# Patient Record
Sex: Male | Born: 1980
Health system: Southern US, Community
[De-identification: ages and names within clinical notes are randomized; demographics above are authoritative.]

## PROBLEM LIST (undated history)

## (undated) DIAGNOSIS — N183 Chronic kidney disease, stage 3 unspecified: Secondary | ICD-10-CM

## (undated) DIAGNOSIS — I639 Cerebral infarction, unspecified: Secondary | ICD-10-CM

## (undated) DIAGNOSIS — F419 Anxiety disorder, unspecified: Secondary | ICD-10-CM

## (undated) DIAGNOSIS — C6211 Malignant neoplasm of descended right testis: Secondary | ICD-10-CM

## (undated) DIAGNOSIS — I10 Essential (primary) hypertension: Secondary | ICD-10-CM

## (undated) DIAGNOSIS — N5089 Other specified disorders of the male genital organs: Secondary | ICD-10-CM

## (undated) DIAGNOSIS — R51 Headache: Secondary | ICD-10-CM

## (undated) DIAGNOSIS — R519 Headache, unspecified: Secondary | ICD-10-CM

## (undated) HISTORY — DX: Malignant neoplasm of descended right testis: C62.11

## (undated) HISTORY — DX: Chronic kidney disease, stage 3 (moderate): N18.3

## (undated) HISTORY — PX: NO PAST SURGERIES: SHX2092

## (undated) HISTORY — DX: Essential (primary) hypertension: I10

## (undated) HISTORY — DX: Chronic kidney disease, stage 3 unspecified: N18.30

---

## 2010-09-19 ENCOUNTER — Emergency Department (HOSPITAL_COMMUNITY)
Admission: EM | Admit: 2010-09-19 | Discharge: 2010-09-19 | Disposition: A | Discharge status: Home / Self Care | Payer: No Typology Code available for payment source | Attending: Emergency Medicine | Admitting: Emergency Medicine

## 2010-09-19 DIAGNOSIS — IMO0001 Reserved for inherently not codable concepts without codable children: Secondary | ICD-10-CM | POA: Insufficient documentation

## 2010-09-19 DIAGNOSIS — L0231 Cutaneous abscess of buttock: Secondary | ICD-10-CM | POA: Insufficient documentation

## 2010-09-19 DIAGNOSIS — Z79899 Other long term (current) drug therapy: Secondary | ICD-10-CM | POA: Insufficient documentation

## 2010-09-19 DIAGNOSIS — I10 Essential (primary) hypertension: Secondary | ICD-10-CM | POA: Insufficient documentation

## 2010-09-19 DIAGNOSIS — M79609 Pain in unspecified limb: Secondary | ICD-10-CM | POA: Insufficient documentation

## 2013-06-24 ENCOUNTER — Emergency Department (HOSPITAL_COMMUNITY): Payer: Self-pay

## 2013-06-24 ENCOUNTER — Emergency Department (HOSPITAL_COMMUNITY)
Admission: EM | Admit: 2013-06-24 | Discharge: 2013-06-25 | Disposition: A | Discharge status: Home / Self Care | Payer: Self-pay | Attending: Emergency Medicine | Admitting: Emergency Medicine

## 2013-06-24 ENCOUNTER — Encounter (HOSPITAL_COMMUNITY): Payer: Self-pay | Admitting: Emergency Medicine

## 2013-06-24 DIAGNOSIS — N5089 Other specified disorders of the male genital organs: Secondary | ICD-10-CM

## 2013-06-24 DIAGNOSIS — F17200 Nicotine dependence, unspecified, uncomplicated: Secondary | ICD-10-CM | POA: Insufficient documentation

## 2013-06-24 DIAGNOSIS — Z79899 Other long term (current) drug therapy: Secondary | ICD-10-CM | POA: Insufficient documentation

## 2013-06-24 DIAGNOSIS — N508 Other specified disorders of male genital organs: Secondary | ICD-10-CM | POA: Insufficient documentation

## 2013-06-24 DIAGNOSIS — N50811 Right testicular pain: Secondary | ICD-10-CM

## 2013-06-24 DIAGNOSIS — R599 Enlarged lymph nodes, unspecified: Secondary | ICD-10-CM | POA: Insufficient documentation

## 2013-06-24 DIAGNOSIS — I10 Essential (primary) hypertension: Secondary | ICD-10-CM | POA: Insufficient documentation

## 2013-06-24 DIAGNOSIS — G8929 Other chronic pain: Secondary | ICD-10-CM | POA: Insufficient documentation

## 2013-06-24 LAB — BASIC METABOLIC PANEL
BUN: 12 mg/dL (ref 6–23)
CALCIUM: 9.1 mg/dL (ref 8.4–10.5)
CO2: 32 mEq/L (ref 19–32)
CREATININE: 0.94 mg/dL (ref 0.50–1.35)
Chloride: 101 mEq/L (ref 96–112)
GFR calc Af Amer: >90 mL/min (ref >90)
GLUCOSE: 111 mg/dL — AB (ref 70–99)
Potassium: 3.2 mEq/L — ABNORMAL LOW (ref 3.7–5.3)
SODIUM: 142 meq/L (ref 137–147)

## 2013-06-24 LAB — CBC
HCT: 42.2 % (ref 39.0–52.0)
HEMOGLOBIN: 15 g/dL (ref 13.0–17.0)
MCH: 33.6 pg (ref 26.0–34.0)
MCHC: 35.5 g/dL (ref 30.0–36.0)
MCV: 94.6 fL (ref 78.0–100.0)
PLATELETS: 213 10*3/uL (ref 150–400)
RBC: 4.46 MIL/uL (ref 4.22–5.81)
RDW: 13.3 % (ref 11.5–15.5)
WBC: 10.8 10*3/uL — ABNORMAL HIGH (ref 4.0–10.5)

## 2013-06-24 MED ORDER — OXYCODONE-ACETAMINOPHEN 5-325 MG PO TABS
2.0000 | ORAL_TABLET | Freq: Once | ORAL | Status: AC
  Administered 2013-06-24: 2 via ORAL
  Filled 2013-06-24: qty 2

## 2013-06-24 NOTE — ED Notes (Signed)
US at bedside

## 2013-06-24 NOTE — ED Provider Notes (Signed)
CSN: [id number]     Arrival date & time 06/24/13  2221 History   First MD Initiated Contact with Patient 06/24/13 2259     Chief Complaint  Patient presents with  . Testicle Pain     (Consider location/radiation/quality/duration/timing/severity/associated sxs/prior Treatment) HPI Hx per PT - R testicle pain x 1 week, no trauma, is not sexually active x last 6 months. No dysuria or hematuria. No h/o kidney stones. Has chronic back pain - no new back pain and no ABD pain.  He did notice nontender R inguinal lymph node 1 month ago.  No rash or lesions otherwise. No F/C. No N/V. Pain is soreness, mod in severity. Hurts to touch.   Past Medical History  Diagnosis Date  . Hypertension    History reviewed. No pertinent past surgical history. No family history on file. History  Substance Use Topics  . Smoking status: Current Every Day Smoker    Types: Cigars, Cigarettes  . Smokeless tobacco: Not on file  . Alcohol Use: No    Review of Systems  Constitutional: Negative for fever and chills.  Eyes: Negative for redness.  Respiratory: Negative for shortness of breath.   Cardiovascular: Negative for chest pain.  Gastrointestinal: Negative for abdominal pain.  Genitourinary: Positive for testicular pain. Negative for dysuria, discharge and scrotal swelling.  Musculoskeletal: Negative for neck pain.  Skin: Negative for rash.  Neurological: Negative for headaches.  All other systems reviewed and are negative.     Allergies  Review of patient's allergies indicates no known allergies.  Home Medications   Prior to Admission medications   Medication Sig Start Date End Date Taking? Authorizing Provider  hydrochlorothiazide (HYDRODIURIL) 25 MG tablet Take 25 mg by mouth daily.   Yes Historical Provider, MD  lisinopril (PRINIVIL,ZESTRIL) 20 MG tablet Take 20 mg by mouth every evening.   Yes Historical Provider, MD  traMADol (ULTRAM) 50 MG tablet Take 50 mg by mouth every 6 (six) hours  as needed for moderate pain (back pain).   Yes Historical Provider, MD   BP 141/104  Pulse 95  Temp(Src) 98.3 F (36.8 C) (Oral)  Resp 20  SpO2 100% Physical Exam  Constitutional: He is oriented to person, place, and time. He appears well-developed and well-nourished.  HENT:  Head: Normocephalic and atraumatic.  Eyes: EOM are normal. Pupils are equal, round, and reactive to light.  Neck: Neck supple.  Cardiovascular: Regular rhythm and intact distal pulses.   Pulmonary/Chest: Effort normal. No respiratory distress.  Genitourinary:  Circ, tender R testicle no mass or edema. nontender R inguinal lymph node. No rash or lesion otherwise.   Musculoskeletal: Normal range of motion. He exhibits no edema.  Neurological: He is alert and oriented to person, place, and time. No cranial nerve deficit.  Skin: Skin is warm and dry.    ED Course  Procedures (including critical care time) Labs Review Labs Reviewed  CBC - Abnormal; Notable for the following:    WBC 10.8 (*)    All other components within normal limits  BASIC METABOLIC PANEL - Abnormal; Notable for the following:    Potassium 3.2 (*)    Glucose, Bld 111 (*)    All other components within normal limits  URINALYSIS, ROUTINE W REFLEX MICROSCOPIC    Imaging Review US Scrotum  06/25/2013   CLINICAL DATA:  Right testicular pain for 1 week.  EXAM: SCROTAL ULTRASOUND  DOPPLER ULTRASOUND OF THE TESTICLES  TECHNIQUE: Complete ultrasound examination of the testicles, epididymis, and other  scrotal structures was performed. Color and spectral Doppler ultrasound were also utilized to evaluate blood flow to the testicles.  COMPARISON:  None.  FINDINGS: Right testicle  Measurements: 4 x 1.8 x 2.6 cm. 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 Parenchymal echotexture is somewhat heterogeneous. No discrete mass lesions are identified but microlithiasis may correspond with increased incidence of testicular tumors. Normal  homogeneous flow signal demonstrated.  Left testicle  Measurements: 3.9 x 2 x 2.6 cm. Diffuse parenchymal microlithiasis. Mild diffusely heterogeneous parenchymal echotexture. Normal homogeneous flow signal demonstrated.  Right epididymis:  Normal in size and appearance.  Left epididymis:  Normal in size and appearance.  Hydrocele:  None visualized.  Varicocele:  Small bilateral varicoceles demonstrated.  Pulsed Doppler interrogation of both testes demonstrates low resistance arterial and venous waveforms bilaterally.  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 No evidence of torsion or epididymo-orchitis. Small bilateral varicoceles.   Electronically Signed   By: Lucienne Capers M.D.   On: 06/25/2013 00:16   [place] Art/ven Flow Abd Pelv Doppler  06/25/2013   CLINICAL DATA:  Right testicular pain for 1 week.  EXAM: SCROTAL ULTRASOUND  DOPPLER ULTRASOUND OF THE TESTICLES  TECHNIQUE: Complete ultrasound examination of the testicles, epididymis, and other scrotal structures was performed. Color and spectral Doppler ultrasound were also utilized to evaluate blood flow to the testicles.  COMPARISON:  None.  FINDINGS: Right testicle  Measurements: 4 x 1.8 x 2.6 cm. 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 Parenchymal echotexture is somewhat heterogeneous. No discrete mass lesions are identified but microlithiasis may correspond with increased incidence of testicular tumors. Normal homogeneous flow signal demonstrated.  Left testicle  Measurements: 3.9 x 2 x 2.6 cm. Diffuse parenchymal microlithiasis. Mild diffusely heterogeneous parenchymal echotexture. Normal homogeneous flow signal demonstrated.  Right epididymis:  Normal in size and appearance.  Left epididymis:  Normal in size and appearance.  Hydrocele:  None visualized.  Varicocele:  Small bilateral varicoceles demonstrated.  Pulsed Doppler interrogation of both testes demonstrates low  resistance arterial and venous waveforms bilaterally.  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 No evidence of torsion or epididymo-orchitis. Small bilateral varicoceles.   Electronically Signed   By: Lucienne Capers M.D.   On: 06/25/2013 00:16   Percocet, azithro/ rocephin, potassium provided  Pain improved on recheck. [place] and UA results shared with PT. Plan Urolopgy referral, ABX and close outpatient follow up.   MDM   Final diagnoses:  Pain in right testicle  Testicular microlithiasis   Presents with 1 week of testicle pain and R inguinal lypmphadenopathy. Denies any sexual active. Does have tenderness right testicle without swelling or mass. No torsion on ultrasound. No infectious symptoms otherwise/ no discharge. Evaluated with urinalysis and ultrasound reviewed as above. Pain improved with medications provided. Vital signs is reviewed and considered.    Teressa Lower, MD 06/26/13 [phone]

## 2013-06-24 NOTE — ED Notes (Signed)
Pt reports R testicular pain x 1 week-denies swelling or redness at this time.

## 2013-06-25 LAB — URINALYSIS, ROUTINE W REFLEX MICROSCOPIC
BILIRUBIN URINE: NEGATIVE
Glucose, UA: NEGATIVE mg/dL
HGB URINE DIPSTICK: NEGATIVE
KETONES UR: NEGATIVE mg/dL
Leukocytes, UA: NEGATIVE
Nitrite: NEGATIVE
Protein, ur: NEGATIVE mg/dL
SPECIFIC GRAVITY, URINE: 1.014 (ref 1.005–1.030)
UROBILINOGEN UA: 0.2 mg/dL (ref 0.0–1.0)
pH: 6 (ref 5.0–8.0)

## 2013-06-25 MED ORDER — POTASSIUM CHLORIDE CRYS ER 20 MEQ PO TBCR
40.0000 meq | EXTENDED_RELEASE_TABLET | Freq: Once | ORAL | Status: AC
  Administered 2013-06-25: 40 meq via ORAL
  Filled 2013-06-25: qty 2

## 2013-06-25 MED ORDER — OXYCODONE-ACETAMINOPHEN 5-325 MG PO TABS: 2.0000 | ORAL_TABLET | ORAL | Status: DC | PRN

## 2013-06-25 MED ORDER — CEFTRIAXONE SODIUM 250 MG IJ SOLR
250.0000 mg | Freq: Once | INTRAMUSCULAR | Status: AC
  Administered 2013-06-25: 250 mg via INTRAMUSCULAR
  Filled 2013-06-25: qty 250

## 2013-06-25 MED ORDER — AZITHROMYCIN 250 MG PO TABS
1000.0000 mg | ORAL_TABLET | Freq: Once | ORAL | Status: AC
  Administered 2013-06-25: 1000 mg via ORAL
  Filled 2013-06-25: qty 4

## 2013-06-25 MED ORDER — LIDOCAINE HCL 1 % IJ SOLN
INTRAMUSCULAR | Status: AC
  Administered 2013-06-25: 20 mL
  Filled 2013-06-25: qty 20

## 2013-06-25 MED ORDER — DOXYCYCLINE HYCLATE 100 MG PO CAPS: 100.0000 mg | ORAL_CAPSULE | Freq: Two times a day (BID) | ORAL | Status: DC

## 2013-06-25 NOTE — Discharge Instructions (Signed)
Testicular Self-Exam  A self-examination of your testicles involves looking at and feeling your testicles for abnormal lumps or swelling. Several things can cause swelling, lumps, or pain in your testicles. Some of these causes are:  · Injuries.  · Inflammation.  · Infection.  · Accumulation of fluids around your testicle (hydrocele).  · Twisted testicles (testicular torsion).  · Testicular cancer.  Self-examination of the testicles and groin areas may be advised if you are at risk for testicular cancer. Risks for testicular cancer include:  · An undescended testicle (cryptorchidism).  · A history of previous testicular cancer.  · A family history of testicular cancer.  The testicles are easiest to examine after warm baths or showers and are more difficult to examine when you are cold. This is because the muscles attached to the testicles retract and pull them up higher or into the abdomen.  Follow these steps while you are standing:  · Hold your penis away from your body.  · Roll one testicle between your thumb and forefinger, feeling the entire testicle.  · Roll the other testicle between your thumb and forefinger, feeling the entire testicle.  Feel for lumps, swelling, or discomfort. A normal testicle is egg shaped and feels firm. It is smooth and not tender. The spermatic cord can be felt as a firm spaghetti-like cord at the back of your testicle. It is also important to examine the crease between the front of your leg and your abdomen. Feel for any bumps that are tender. These could be enlarged lymph nodes.   Document Released: 05/06/2000 Document Revised: 09/30/2012 Document Reviewed: 07/20/2012  [employer]® Patient Information ©2014 [employer].

## 2013-09-13 ENCOUNTER — Emergency Department (HOSPITAL_COMMUNITY)
Admission: EM | Admit: 2013-09-13 | Discharge: 2013-09-13 | Disposition: A | Discharge status: Home / Self Care | Payer: Self-pay | Attending: Emergency Medicine | Admitting: Emergency Medicine

## 2013-09-13 ENCOUNTER — Emergency Department (HOSPITAL_COMMUNITY): Payer: Self-pay

## 2013-09-13 ENCOUNTER — Encounter (HOSPITAL_COMMUNITY): Payer: Self-pay | Admitting: Emergency Medicine

## 2013-09-13 DIAGNOSIS — R1032 Left lower quadrant pain: Secondary | ICD-10-CM

## 2013-09-13 DIAGNOSIS — R109 Unspecified abdominal pain: Secondary | ICD-10-CM | POA: Insufficient documentation

## 2013-09-13 DIAGNOSIS — F17200 Nicotine dependence, unspecified, uncomplicated: Secondary | ICD-10-CM | POA: Insufficient documentation

## 2013-09-13 DIAGNOSIS — K6289 Other specified diseases of anus and rectum: Secondary | ICD-10-CM | POA: Insufficient documentation

## 2013-09-13 DIAGNOSIS — I10 Essential (primary) hypertension: Secondary | ICD-10-CM | POA: Insufficient documentation

## 2013-09-13 DIAGNOSIS — Z79899 Other long term (current) drug therapy: Secondary | ICD-10-CM | POA: Insufficient documentation

## 2013-09-13 LAB — CBC WITH DIFFERENTIAL/PLATELET
BASOS PCT: 0 % (ref 0–1)
Basophils Absolute: 0 10*3/uL (ref 0.0–0.1)
EOS ABS: 0.3 10*3/uL (ref 0.0–0.7)
Eosinophils Relative: 2 % (ref 0–5)
HCT: 37.8 % — ABNORMAL LOW (ref 39.0–52.0)
Hemoglobin: 13.1 g/dL (ref 13.0–17.0)
Lymphocytes Relative: 23 % (ref 12–46)
Lymphs Abs: 2.8 10*3/uL (ref 0.7–4.0)
MCH: 32.3 pg (ref 26.0–34.0)
MCHC: 34.7 g/dL (ref 30.0–36.0)
MCV: 93.3 fL (ref 78.0–100.0)
Monocytes Absolute: 0.8 10*3/uL (ref 0.1–1.0)
Monocytes Relative: 6 % (ref 3–12)
NEUTROS PCT: 69 % (ref 43–77)
Neutro Abs: 8.2 10*3/uL — ABNORMAL HIGH (ref 1.7–7.7)
PLATELETS: 237 10*3/uL (ref 150–400)
RBC: 4.05 MIL/uL — ABNORMAL LOW (ref 4.22–5.81)
RDW: 13.3 % (ref 11.5–15.5)
WBC: 12.1 10*3/uL — ABNORMAL HIGH (ref 4.0–10.5)

## 2013-09-13 LAB — COMPREHENSIVE METABOLIC PANEL
ALT: 8 U/L (ref 0–53)
AST: 12 U/L (ref 0–37)
Albumin: 3.6 g/dL (ref 3.5–5.2)
Alkaline Phosphatase: 111 U/L (ref 39–117)
Anion gap: 12 (ref 5–15)
BUN: 7 mg/dL (ref 6–23)
CO2: 25 mEq/L (ref 19–32)
Calcium: 9.7 mg/dL (ref 8.4–10.5)
Chloride: 101 mEq/L (ref 96–112)
Creatinine, Ser: 1.01 mg/dL (ref 0.50–1.35)
GFR calc non Af Amer: >90 mL/min (ref >90)
Glucose, Bld: 124 mg/dL — ABNORMAL HIGH (ref 70–99)
POTASSIUM: 3.2 meq/L — AB (ref 3.7–5.3)
SODIUM: 138 meq/L (ref 137–147)
TOTAL PROTEIN: 7 g/dL (ref 6.0–8.3)
Total Bilirubin: 0.2 mg/dL — ABNORMAL LOW (ref 0.3–1.2)

## 2013-09-13 LAB — LIPASE, BLOOD: Lipase: 42 U/L (ref 11–59)

## 2013-09-13 LAB — URINALYSIS, ROUTINE W REFLEX MICROSCOPIC
Bilirubin Urine: NEGATIVE
Glucose, UA: NEGATIVE mg/dL
Hgb urine dipstick: NEGATIVE
Ketones, ur: NEGATIVE mg/dL
LEUKOCYTES UA: NEGATIVE
Nitrite: NEGATIVE
PROTEIN: NEGATIVE mg/dL
Specific Gravity, Urine: 1.024 (ref 1.005–1.030)
Urobilinogen, UA: 0.2 mg/dL (ref 0.0–1.0)
pH: 6 (ref 5.0–8.0)

## 2013-09-13 LAB — TYPE AND SCREEN
ABO/RH(D): B POS
Antibody Screen: NEGATIVE

## 2013-09-13 LAB — ABO/RH: ABO/RH(D): B POS

## 2013-09-13 LAB — POC OCCULT BLOOD, ED: Fecal Occult Bld: NEGATIVE

## 2013-09-13 MED ORDER — POTASSIUM CHLORIDE CRYS ER 20 MEQ PO TBCR
40.0000 meq | EXTENDED_RELEASE_TABLET | Freq: Once | ORAL | Status: AC
  Administered 2013-09-13: 40 meq via ORAL
  Filled 2013-09-13: qty 2

## 2013-09-13 MED ORDER — IOHEXOL 300 MG/ML  SOLN
100.0000 mL | Freq: Once | INTRAMUSCULAR | Status: AC | PRN
  Administered 2013-09-13: 100 mL via INTRAVENOUS

## 2013-09-13 MED ORDER — HYDROCHLOROTHIAZIDE 12.5 MG PO CAPS
25.0000 mg | ORAL_CAPSULE | Freq: Once | ORAL | Status: AC
  Administered 2013-09-13: 25 mg via ORAL
  Filled 2013-09-13: qty 2

## 2013-09-13 MED ORDER — HYDROCODONE-ACETAMINOPHEN 5-325 MG PO TABS: 1.0000 | ORAL_TABLET | Freq: Four times a day (QID) | ORAL | Status: DC | PRN

## 2013-09-13 MED ORDER — IOHEXOL 300 MG/ML  SOLN
50.0000 mL | Freq: Once | INTRAMUSCULAR | Status: AC | PRN
  Administered 2013-09-13: 50 mL via ORAL

## 2013-09-13 MED ORDER — SODIUM CHLORIDE 0.9 % IV BOLUS (SEPSIS)
1000.0000 mL | Freq: Once | INTRAVENOUS | Status: AC
  Administered 2013-09-13: 1000 mL via INTRAVENOUS

## 2013-09-13 MED ORDER — HYDROMORPHONE HCL PF 1 MG/ML IJ SOLN
1.0000 mg | Freq: Once | INTRAMUSCULAR | Status: AC
  Administered 2013-09-13: 1 mg via INTRAVENOUS
  Filled 2013-09-13: qty 1

## 2013-09-13 MED ORDER — SULFAMETHOXAZOLE-TRIMETHOPRIM 800-160 MG PO TABS: 1.0000 | ORAL_TABLET | Freq: Two times a day (BID) | ORAL | Status: DC

## 2013-09-13 MED ORDER — LISINOPRIL 20 MG PO TABS
20.0000 mg | ORAL_TABLET | Freq: Once | ORAL | Status: AC
  Administered 2013-09-13: 20 mg via ORAL
  Filled 2013-09-13: qty 1

## 2013-09-13 NOTE — ED Notes (Signed)
Pt aware urine sample is needed 

## 2013-09-13 NOTE — ED Provider Notes (Signed)
CSN: [id number]     Arrival date & time 09/13/13  1151 History   First MD Initiated Contact with Patient 09/13/13 1226     Chief Complaint  Patient presents with  . Abdominal Pain    HPI Mr. Boyett is a 33 yo man with a PMH of hypertension who is here for assessment of his abdominal pain and one episode of bloody diarrhea. The episode of bloody diarrhea occurred yesterday, with a moderate amount of red blood. Then, he woke up this morning with 10/10 abdominal pain that shifts with different positions. He describes it as a stabbing pain that has not subsided. He has never had pain like this. He was able to eat some cereal this morning, and while this did not make the pain worse, it also did not subside; he decided to come to the ED. He has not had any BMs today. He denies nausea, vomiting, recent travel, sick contacts, recent abnormal diet, recent antibiotic use (last over 2 months ago) or illicit drug use. He did not take his BP medications yet today.  Past Medical History  Diagnosis Date  . Hypertension    History reviewed. No pertinent past surgical history. No family history on file. History  Substance Use Topics  . Smoking status: Current Every Day Smoker    Types: Cigars, Cigarettes  . Smokeless tobacco: Not on file  . Alcohol Use: No     Review of Systems General: no fever, chills, recent illness Skin: no rashes HEENT: no headaches, changes in vision Cardiac: no chest pain, no palpitations Respiratory: no shortness of breath GI: see HPI Urinary: no dysuria, no hematuria Msk: no joint or muscle pain Endocrine: no changes in temperature tolerance, weight changes Psychiatric: no depression or anxiety   Allergies  Review of patient's allergies indicates no known allergies.  Home Medications   Prior to Admission medications   Medication Sig Start Date End Date Taking? Authorizing Provider  hydrochlorothiazide (HYDRODIURIL) 25 MG tablet Take 25 mg by mouth daily.   Yes  Historical Provider, MD  lisinopril (PRINIVIL,ZESTRIL) 20 MG tablet Take 20 mg by mouth every evening.   Yes Historical Provider, MD  traMADol (ULTRAM) 50 MG tablet Take 50 mg by mouth every 6 (six) hours as needed for moderate pain (back pain).   Yes Historical Provider, MD   BP 189/112  Pulse 85  Temp(Src) 98.7 F (37.1 C) (Oral)  Resp 18  SpO2 100% Physical Exam Appearance: lying prone in stretcher, looks uncomfortable HEENT: AT/[place], PERRL, EOMi Heart: RRR, normal S1S2, no MRG Lungs: CTAB, no WRR Abdomen: BS+, thin abdomen, moving around in bed but rigid on exam; soft when patient asked to relax, tender to palpation in all 4 quadrants, worst in LLQ, no rebound GU: CVA tenderness bilaterally Extremities: nontender, no edema Neurologic: A&Ox3, sensation and strength intact Skin: no rashes or lesions   ED Course  Procedures (including critical care time) Labs Review Labs Reviewed  CBC WITH DIFFERENTIAL - Abnormal; Notable for the following:    WBC 12.1 (*)    RBC 4.05 (*)    HCT 37.8 (*)    Neutro Abs 8.2 (*)    All other components within normal limits  COMPREHENSIVE METABOLIC PANEL  LIPASE, BLOOD  URINALYSIS, ROUTINE W REFLEX MICROSCOPIC  TYPE AND SCREEN  ABO/RH    MDM   Final diagnoses:  None   This 33 yo man with a PMH of hypertension is here for assessment of his abdominal pain with preceding bloody  diarrhea. He is afebrile with an elevated white count (12.1) and his exam revealed a rigid abdomen that was soft upon relaxation and tender to palpation throughout, worst in the LLQ. His FOBT was negative. The patient is moving around in bed. His lipase, LFTs and UA are within normal limits; potassium is a bit low at 3.2, likely in the context of his HCTZ. The differential remains broad, but includes diverticulitis.  - dilaudid for pain control - CT abdomen with contrast for evaluation of possible diverticulitis    Drucilla Schmidt, MD 09/13/13 1509  Drucilla Schmidt,  MD 09/13/13 1515  Drucilla Schmidt, MD 09/13/13 1517  Drucilla Schmidt, MD 09/13/13 [place], MD 09/13/13 [phone]

## 2013-09-13 NOTE — ED Notes (Addendum)
Initial contact-A&Ox4. Moving all extremities. Reports diarrhea x1 yesterday with small amount of blood ("not bright red"). Denies chest pain, SOB, dizziness. Denies fevers. Reports 10/10 abdominal pain starting today in and right and left abdomen. Radiates into pelvis. Guarding abdomen. Hasn't taken any medications today. RR even/unlabored. Not on blood thinners. No hx abdominal surgeries. No family hx esophageal/intestinal issues. Awaiting MD.

## 2013-09-13 NOTE — ED Notes (Signed)
Pt c/o abd pain x 1 day and rectal bleeding x 2 days. Denies n/v.

## 2013-09-13 NOTE — ED Provider Notes (Signed)
Patient is a 33 y.o. Male who was signed out to me from Dr. Tawnya Crook and resident Drucilla Schmidt for abdominal pain which start today following a blood BM yesterday.  Patients labs here show hypokalemia, mild leukocytosis, and negative UA.    Physical exam of the patient reveals an uncomfortable but non toxic appearing male in no acute distress.  Patient is alert and oriented x 4.  He moves all four extremities and follows commands.  Lungs are clear to auscultation A&P.  Heart is regular rate and rhythm.  There are no murmurs rubs and gallops.  There are bowel sounds in all four quadrants.  Abdomen in normal in appearance. There are positive bowel sounds in all four quadrants.  Patient has no rigidity, but has tenderness to palpation which is generalized.    Patient had CT scan of the abdomen and pelvis which shows possible mild proctitis at this time.  Patient is negative for diverticulitis or any other acute intraabdominal processes.  Patient is stable for discharge at this time.  I will given him one more dose of pain medication prior to leaving.  Will discharge the patient with prescriptions for Bactrim DS BID x 6 weeks at this time and hydrocodone 5/325 mg #6.  Will also give 40 mEq of potassium here prior to discharge and reorder home HTN meds at the patient is hypertensive here in the ED and has not taken his medications for his HTN today.  Patient was told to return to the ED for severe GI bleeding, intractable pain despite pain medication at home, and intractable nausea and vomiting.  Patient states understanding and agreement at this time.  Patient states that he will follow-up with his PCP at [facility] at this time.  I have discussed this patient with Dr. Kathrynn Humble at this time who agrees with the above treatment.    Cherylann Parr, PA-C 09/13/13 1720

## 2013-09-13 NOTE — Progress Notes (Signed)
[place],  Provided pt with a list of primary care resources to help patient establish primary care.

## 2013-09-13 NOTE — ED Notes (Signed)
MD at bedside. 

## 2013-09-13 NOTE — ED Notes (Signed)
Pt. Is unable to use the restroom at this time, but is aware that we need a urine specimen. Urinal at bedside. 

## 2013-09-13 NOTE — Discharge Instructions (Signed)
Proctitis Proctitis is the swelling and soreness (inflammation) of the lining of the rectum. The rectum is at the end of the large intestine and is attached to the anus. The inflammation causes pain and discomfort. It may be short-term (acute) or long-lasting (chronic). CAUSES Inflammation in the rectum can be caused by many things, such as:  Sexually transmitted diseases (STDs).  Infection.  Anal-rectal trauma or injury.  Ulcerative colitis or Crohn's disease.  Radiation therapy directed near the rectum.  Antibiotic therapy. SYMPTOMS  Sudden, uncomfortable, and frequent urge to have a bowel movement.  Anal or rectal pain.  Abdominal cramping or pain.  Sensation that the rectum is full.  Rectal bleeding.  Pus or mucus discharge from anus.  Diarrhea or frequent soft, loose stools. DIAGNOSIS Diagnosis may include the following:  A history and physical exam.  An STD test.  Blood tests.  Stool tests.  Rectal culture.  A procedure to evaluate the anal canal (anoscopy).  Procedures to look at part, or the entire large bowel (sigmoidoscopy, colonoscopy). TREATMENT Treatment of proctitis depends on the cause. Reducing the symptoms of inflammation and eliminating infection are the main goals of treatment. Treatment may include:  Home remedies and lifestyle, such as sitz baths and avoiding food right before bedtime.  Topical ointments, foams, suppositories, or enemas, such as corticosteroids or anti-inflammatories.  Antibiotic or antiviral medicines to treat infection or to control harmful bacteria.  Medicines to control diarrhea, soften stools, and reduce pain.  Medicines to suppress the immune system.  Avoiding the activity that caused rectal trauma.  Nutritional, dietary, or herbal supplements.  Heat or laser therapy for persistent bleeding.  A dilation procedure to enlarge a narrowed rectum.  Surgery, though rare, may be necessary to repair damaged rectal  lining. HOME CARE INSTRUCTIONS Only take medicines that are recommended or approved by your caregiver.Do not take anti-diarrhea medicine without your caregiver's approval. SEEK MEDICAL CARE IF:  You often experience one or more of the symptoms noted above.  You keep experiencing symptoms after treatment.  You have questions or concerns about your symptoms or treatment plan. MAKE SURE YOU:  Understand these instructions.  Will watch your condition.  Will get help right away if you are not doing well or get worse. [place] of Diabetes and Digestive and Kidney Disease (NIDDK): [email or web address] Document Released: 01/17/2011 Document Revised: 05/25/2012 Document Reviewed: 01/17/2011 [employer] [facility] Patient Information 2015 [place], [place]. This information is not intended to replace advice given to you by your health care provider. Make sure you discuss any questions you have with your health care provider.   Emergency Department Resource Guide 1) Find a Doctor and Pay Out of Pocket Although you won't have to find out who is covered by your insurance plan, it is a good idea to ask around and get recommendations. You will then need to call the office and see if the doctor you have chosen will accept you as a new patient and what types of options they offer for patients who are self-pay. Some doctors offer discounts or will set up payment plans for their patients who do not have insurance, but you will need to ask so you aren't surprised when you get to your appointment.  2) Contact Your Local Health Department Not all health departments have doctors that can see patients for sick visits, but many do, so it is worth a call to see if yours does. If you don't know where your local health department is, you  can check in your phone book. The CDC also has a tool to help you locate your state's health department, and many state websites also have listings of all of  their local health departments.  3) Find a [facility] If your illness is not likely to be very severe or complicated, you may want to try a walk in clinic. These are popping up all over the country in pharmacies, drugstores, and shopping centers. They're usually staffed by nurse practitioners or physician assistants that have been trained to treat common illnesses and complaints. They're usually fairly quick and inexpensive. However, if you have serious medical issues or chronic medical problems, these are probably not your best option.  No Primary Care Doctor: - Call Health Connect at  [phone] - they can help you locate a primary care doctor that  accepts your insurance, provides certain services, etc. - Physician Referral Service- [phone]  Chronic Pain Problems: Organization         Address  Phone   Notes  [facility]  [phone] Patients need to be referred by their primary care doctor.   Medication Assistance: Organization         Address  Phone   Notes  [facility] Medication [facility] [address] [place], [place] [address] [phone] --Must be a resident of [facility] -- Must have NO insurance coverage whatsoever (no Medicaid/ Medicare, etc.) -- The pt. MUST have a primary care doctor that directs their care regularly and follows them in the community   MedAssist  [phone]   [employer]  [phone]    Agencies that provide inexpensive medical care: Organization         Address  Phone   Notes  [place]  [phone]   [facility]    [phone]   [facility] [place], [place] [address] [phone]   [place] [address], [place] [phone]   Planned Parenthood    [phone]   [facility]    [phone]   [place] and [employer] [address],  [place] Phone:  [phone], Fax:  [phone] Hours of Operation:  9 am - 6 pm, M-F.  Also accepts Medicaid/Medicare and self-pay.  [facility] for [place] [address], [address], [place] Phone: [phone], Fax: [phone]. Hours of Operation:  8:30 am - 5:30 pm, M-F.  Also accepts Medicaid and self-pay.  [facility] [place] [address] [place] Phone: [phone]   [place], [place], [place] [phone], Ext. [phone] Mondays & Thursdays: 7-9 AM.  First 15 patients are seen on a first come, first serve basis.    Jane Providers:  Organization         Address  Phone   Notes  [facility] [address], [place] [phone] Also accepts self-pay patients.  [facility] [address], [place]  [phone]   [place], [address], [place] [phone]   [facility] [address], [place] [phone]   Lucianne Lei [address], [place]   [phone] Only accepts [place] Access [place] patients after they have their name applied  to their card.   Self-Pay (no insurance) in [facility]:  Organization         Address  Phone   Notes  Sickle Cell Patients, [facility] Internal [facility] [place] [phone]   [facility] [place] [phone]   [facility] [place]  [place], [place], [place] [phone]   [facility]/Dr. Osei-Bonsu  [address] [place] or [address], [place] [phone] Phone number for both [place] and [place] locations is the same.  Urgent Medical and [facility] [address] [place] [phone]   [facility] [address], [place] or [address] [phone]   [facility] [address] [place] [phone], phone; [phone], fax Sees patients 1st and 3rd Saturday of every month.  Must not qualify for public or private insurance (i.e. Medicaid, Medicare, [place] Health Choice, Veterans' Benefits)  Household income should be no more than 200% of the poverty level The clinic cannot treat you if you are pregnant or think you are pregnant  Sexually transmitted diseases are not treated at the clinic.    Dental Care: Organization         Address  Phone  Notes  [facility] of [facility] [place] [phone] Accepts children up to age 82 who are enrolled in [place] or [place]; pregnant women with a Medicaid card; and children who have applied for Medicaid or [place] Health Choice, but were declined, whose parents can pay a reduced fee at time of service.  [facility] Department of [facility]  [address], [place] [phone] Accepts children up to age 54 who are enrolled in [place] or [place]; pregnant women with a Medicaid card; and children who have applied for Medicaid or [place] Health Choice, but were declined, whose parents can pay a reduced fee at time of service.  [place] Adult [facility] Access PROGRAM  [place] [phone] Patients are seen by appointment only. Walk-ins are not accepted. [place] will see patients 68 years of age and older. Monday - Tuesday (8am-5pm) Most Wednesdays (8:30-5pm) $30 per visit, cash only  [facility] Adult Dental Access PROGRAM  [address], [facility] [phone] Patients are seen by appointment only. Walk-ins are not accepted. No Name will see patients 52 years of age and older. One Wednesday Evening (Monthly: Volunteer Based).  $30 per visit, cash only  [place]   [phone] for adults; Children under age 22, call Graduate Pediatric Dentistry at [phone]. Children aged 64-14, please call [phone] to request a pediatric application.  Dental services are provided in all areas of dental care including fillings, crowns and bridges, complete and partial dentures, implants, gum treatment, root canals, and extractions. Preventive care is also provided. Treatment is provided to both adults and children. Patients are selected via a lottery and there is often a waiting list.   [facility] [address], [place]  [phone] [email or web address]   [facility] [address] [place], [place] [phone], Ext. [phone] Second and Fourth Thursday of each month, opens at 6:30 AM; Clinic ends at 9 AM.  Patients are seen on a first-come first-served  basis, and a limited number are seen during each clinic.   [facility]  [address] Hillard Danker [place], [place] [phone]   Eligibility Requirements You must have lived in [place], [place], or [place] counties for at least the last three months.   You cannot be eligible for state or federal sponsored [employer], including [employer], [place], or [employer].   You generally cannot be eligible for healthcare insurance through your employer.    How to apply: Eligibility screenings are held every Tuesday and Wednesday afternoon from 1:00 pm until 4:00 pm. You do not need an appointment for the interview!  [facility] [address] [place], [place]   [place]  [place] Department  [place]  [phone]    Behavioral Health Resources in the Community: Intensive Outpatient Programs Organization         Address  Phone  Notes  Britton Hagan. [address] [place], [place] [phone]   [facility] Outpatient [address] [place], [place]   ADS: Alcohol & Drug Svcs [address] [place], [place]   [place] [address]  [place], [place] or [phone]   Substance Abuse Resources Organization         Address  Phone  Notes  Alcohol and Drug Services  [phone]   [place]  [phone]   The [place]   Chinita Pester  [phone]   Residential & Outpatient Substance Abuse Program  [phone]   Psychological Services Organization         Address  Phone  Notes  [facility] [place]  [place]  [phone]   [place] [address], [place] or [phone]    Mobile Crisis Teams Organization         Address  Phone  Notes  [facility]rapeutic Alternatives, Mobile Crisis Care Unit  [phone]   [facility]ive Psychotherapeutic Services  [address] [place] [address], [place]   [place] Levels [address] [place] [phone]    Self-Help/Support Groups Organization         Address  Phone             Notes  [place]. of [place] - variety of support groups  [place] Call for more information  Narcotics Anonymous (NA), [facility] [address], [employer] [place]  2 meetings at this location   Special educational needs teacher         Address  Phone  Notes  ASAP Residential Treatment [place],    [place]  [phone]   [facility]  [address] [place] [address], [place], [place]   [place], [place] [phone] Admissions: 8am-3pm M-F  Incentives Substance [place] [address],    [place], [place] [phone]   [facility] [address] Jadene Pierini [place], [place]   [facility] [address],  [place], [place]     Insight Programs - Intensive Outpatient [address] Kristeen [address], [place], [place]   [facility] ([place].) [place].,  [place], [place] or [phone]   [facility]idential Treatment Services (RTS) [address], [place], [place] Accepts  Medicaid  [facility] [address],  [place] [phone] Substance Abuse/Addiction Treatment   [facility] Organization         Address  Phone  Notes  [facility]  [phone]   Domenic Schwab, PhD [address] [place], [place]   [phone] or [phone]   [place], [place] [phone]   [address], [place], [place] [phone] Insurance/Medicaid/sponsorship through [facility] and Families [address], [address] [place]                                    [place], [place] [phone] [place] [address], [place] [phone]    Dr. Adele Schilder  [phone]   [facility] Dept. 1) [address] [place] 2) [place] 3)  [place] [address], [place] [phone]  [phone]   [place] [phone] or [phone] (After Hours)

## 2013-09-14 LAB — URINE CULTURE
COLONY COUNT: NO GROWTH
Culture: NO GROWTH

## 2013-09-14 NOTE — ED Provider Notes (Signed)
Medical screening examination/treatment/procedure(s) were conducted as a shared visit with resident-physician practitioner(s) and myself.  I personally evaluated the patient during the encounter.  Pt is a 33 y.o. male with pmhx as above presenting with abdominal pain and 1 episode of BRBPR.  Pt found to have generalized abdominal pain initially, but after tx w/ IVF, narcotics, pan localized to LLQ. No rebound, guarding or rigidity on my PE Rectal nml. CT ab/pelvis ordered at time of shift change to r/o diverticulitis.   Results for orders placed during the hospital encounter of 09/13/13  CBC WITH DIFFERENTIAL      Result Value Ref Range   WBC 12.1 (*) 4.0 - 10.5 K/uL   RBC 4.05 (*) 4.22 - 5.81 MIL/uL   Hemoglobin 13.1  13.0 - 17.0 g/dL   HCT 37.8 (*) 39.0 - 52.0 %   MCV 93.3  78.0 - 100.0 fL   MCH 32.3  26.0 - 34.0 pg   MCHC 34.7  30.0 - 36.0 g/dL   RDW 13.3  11.5 - 15.5 %   Platelets 237  150 - 400 K/uL   Neutrophils Relative % 69  43 - 77 %   Neutro Abs 8.2 (*) 1.7 - 7.7 K/uL   Lymphocytes Relative 23  12 - 46 %   Lymphs Abs 2.8  0.7 - 4.0 K/uL   Monocytes Relative 6  3 - 12 %   Monocytes Absolute 0.8  0.1 - 1.0 K/uL   Eosinophils Relative 2  0 - 5 %   Eosinophils Absolute 0.3  0.0 - 0.7 K/uL   Basophils Relative 0  0 - 1 %   Basophils Absolute 0.0  0.0 - 0.1 K/uL  COMPREHENSIVE METABOLIC PANEL      Result Value Ref Range   Sodium 138  137 - 147 mEq/L   Potassium 3.2 (*) 3.7 - 5.3 mEq/L   Chloride 101  96 - 112 mEq/L   CO2 25  19 - 32 mEq/L   Glucose, Bld 124 (*) 70 - 99 mg/dL   BUN 7  6 - 23 mg/dL   Creatinine, Ser 1.01  0.50 - 1.35 mg/dL   Calcium 9.7  8.4 - 10.5 mg/dL   Total Protein 7.0  6.0 - 8.3 g/dL   Albumin 3.6  3.5 - 5.2 g/dL   AST 12  0 - 37 U/L   ALT 8  0 - 53 U/L   Alkaline Phosphatase 111  39 - 117 U/L   Total Bilirubin 0.2 (*) 0.3 - 1.2 mg/dL   GFR calc non Af Amer >90  >90 mL/min   GFR calc Af Amer >90  >90 mL/min   Anion gap 12  5 - 15  LIPASE, BLOOD      Result Value Ref Range   Lipase 42  11 - 59 U/L  URINALYSIS, ROUTINE W REFLEX MICROSCOPIC      Result Value Ref Range   Color, Urine AMBER (*) YELLOW   APPearance CLEAR  CLEAR   Specific Gravity, Urine 1.024  1.005 - 1.030   pH 6.0  5.0 - 8.0   Glucose, UA NEGATIVE  NEGATIVE mg/dL   Hgb urine dipstick NEGATIVE  NEGATIVE   Bilirubin Urine NEGATIVE  NEGATIVE   Ketones, ur NEGATIVE  NEGATIVE mg/dL   Protein, ur NEGATIVE  NEGATIVE mg/dL   Urobilinogen, UA 0.2  0.0 - 1.0 mg/dL   Nitrite NEGATIVE  NEGATIVE   Leukocytes, UA NEGATIVE  NEGATIVE  POC OCCULT BLOOD, ED  Result Value Ref Range   Fecal Occult Bld NEGATIVE  NEGATIVE  TYPE AND SCREEN      Result Value Ref Range   ABO/RH(D) B POS     Antibody Screen NEG     Sample Expiration 09/16/2013    ABO/RH      Result Value Ref Range   ABO/RH(D) B POS     .    Neta Ehlers, MD 09/14/13 [phone]

## 2013-09-15 ENCOUNTER — Emergency Department (HOSPITAL_COMMUNITY)
Admission: EM | Admit: 2013-09-15 | Discharge: 2013-09-15 | Disposition: A | Discharge status: Home / Self Care | Payer: Self-pay | Source: Home / Self Care

## 2013-09-15 ENCOUNTER — Encounter (HOSPITAL_COMMUNITY): Payer: Self-pay | Admitting: Emergency Medicine

## 2013-09-15 DIAGNOSIS — R1084 Generalized abdominal pain: Secondary | ICD-10-CM

## 2013-09-15 NOTE — ED Provider Notes (Signed)
Medical screening examination/treatment/procedure(s) were performed by a resident physician or non-physician practitioner and as the supervising physician I was immediately available for consultation/collaboration.  Linna Darner, MD Family Medicine   Waldemar Dickens, MD 09/15/13 [phone]

## 2013-09-15 NOTE — ED Provider Notes (Signed)
Medical screening examination/treatment/procedure(s) were performed by non-physician practitioner and as supervising physician I was immediately available for consultation/collaboration.   EKG Interpretation None       Varney Biles, MD 09/15/13 0320

## 2013-09-15 NOTE — ED Provider Notes (Signed)
CSN: [id number]     Arrival date & time 09/15/13  2440 History   First MD Initiated Contact with Patient 09/15/13 1014     Chief Complaint  Patient presents with  . Abdominal Pain   (Consider location/radiation/quality/duration/timing/severity/associated sxs/prior Treatment) HPI Comments: 33 year old male presents with a complaint of severe constant abdominal pain. He was seen at [facility] 2 days ago for the same complaint in which he initially had a bloody diarrhea stool followed by the abdominal pain. A CT exam of the abdomen and pelvis revealed only inflammation in the distal rectum. There were no findings suggestive of a etiology for the abdominal pain. He was given IV fluids and IV Dilaudid for pain control. On discharge he was given a prescription for Septra and Norco. He was under the impression that if his abdominal pain persisted and he Norco was not working he should be seen by someone else would give him something stronger for his pain. That is the visit to the urgent care. He is having breakthrough abdominal pain while taking the Norco. Denies nausea vomiting or diarrhea. The pain is worse when stretching his body and that her when coiled up or sitting. The pain is constant and does not abate.                             Results for orders placed during the hospital encounter of 09/13/13  -URINE CULTURE    Specimen Description                                Special Requests              NONE                  Culture  Setup Time                                 Colony Count                                        Culture                                             Report Status                                     -CBC WITH DIFFERENTIAL    WBC K/uL                      12.1 (*)Ref Range: 4.0 - 10.5 K/uL    RBC MIL/uL                    4.05 (*)Ref Range: 4.22 - 5.81 MIL/uL    Hemoglobin g/dL               13.1    Ref Range: 13.0 - 17.0 g/dL    HCT  %  37.8 (*)Ref Range: 39.0 - 52.0 %    MCV fL                        93.3    Ref Range: 78.0 - 100.0 fL    MCH pg                        32.3    Ref Range: 26.0 - 34.0 pg    MCHC g/dL                     34.7    Ref Range: 30.0 - 36.0 g/dL    RDW %                         13.3    Ref Range: 11.5 - 15.5 %    Platelets K/uL                237     Ref Range: 150 - 400 K/uL    Neutrophils Relative % %      69      Ref Range: 43 - 77 %    Neutro Abs K/uL               8.2 (*) Ref Range: 1.7 - 7.7 K/uL    Lymphocytes Relative %        23      Ref Range: 12 - 46 %    Lymphs Abs K/uL               2.8     Ref Range: 0.7 - 4.0 K/uL    Monocytes Relative %          6       Ref Range: 3 - 12 %    Monocytes Absolute K/uL       0.8     Ref Range: 0.1 - 1.0 K/uL    Eosinophils Relative %        2       Ref Range: 0 - 5 %    Eosinophils Absolute K/uL     0.3     Ref Range: 0.0 - 0.7 K/uL    Basophils Relative %          0       Ref Range: 0 - 1 %    Basophils Absolute K/uL       0.0     Ref Range: 0.0 - 0.1 K/uL  -COMPREHENSIVE METABOLIC PANEL    Sodium mEq/L                  138     Ref Range: 137 - 147 mEq/L    Potassium mEq/L               3.2 (*) Ref Range: 3.7 - 5.3 mEq/L    Chloride mEq/L                101     Ref Range: 96 - 112 mEq/L    CO2 mEq/L                     25      Ref Range: 19 - 32 mEq/L    Glucose, Bld mg/dL            124 (*) Ref Range: 70 -  99 mg/dL    BUN mg/dL                     7       Ref Range: 6 - 23 mg/dL    Creatinine, Ser mg/dL         1.01    Ref Range: 0.50 - 1.35 mg/dL    Calcium mg/dL                 9.7     Ref Range: 8.4 - 10.5 mg/dL    Total Protein g/dL            7.0     Ref Range: 6.0 - 8.3 g/dL    Albumin g/dL                  3.6     Ref Range: 3.5 - 5.2 g/dL    AST U/L                       12      Ref Range: 0 - 37 U/L    ALT U/L                       8       Ref Range: 0 - 53 U/L    Alkaline Phosphatase U/L      111     Ref Range:  39 - 117 U/L    Total Bilirubin mg/dL         0.2 (*) Ref Range: 0.3 - 1.2 mg/dL    GFR calc non Af Amer mL/min   >90     Ref Range: >90 mL/min    GFR calc Af Amer mL/min       >90     Ref Range: >90 mL/min    Anion gap                     12      Ref Range: 5 - 15  -LIPASE, BLOOD    Lipase U/L                    42      Ref Range: 11 - 59 U/L  -URINALYSIS, ROUTINE W REFLEX MICROSCOPIC    Color, Urine                  AMBER (*)Ref Range: YELLOW    APPearance                    CLEAR   Ref Range: CLEAR    Specific Gravity, Urine       1.024   Ref Range: 1.005 - 1.030    pH                            6.0     Ref Range: 5.0 - 8.0    Glucose, UA mg/dL             NEGATIVE Ref Range: NEGATIVE mg/dL    Hgb urine dipstick            NEGATIVE Ref Range: NEGATIVE    Bilirubin Urine               NEGATIVE Ref Range: NEGATIVE    Ketones, ur mg/dL  NEGATIVE Ref Range: NEGATIVE mg/dL    Protein, ur mg/dL             NEGATIVE Ref Range: NEGATIVE mg/dL    Urobilinogen, UA mg/dL        0.2     Ref Range: 0.0 - 1.0 mg/dL    Nitrite                       NEGATIVE Ref Range: NEGATIVE    Leukocytes, UA                NEGATIVE Ref Range: NEGATIVE  -POC OCCULT BLOOD, ED    Fecal Occult Bld              NEGATIVE Ref Range: NEGATIVE  -TYPE AND SCREEN    ABO/RH(D)                     B POS                 Antibody Screen               NEG                   Sample Expiration             09/16/2013             -ABO/RH    ABO/RH(D)                     B POS              ,  Patient is a 33 y.o. male presenting with abdominal pain.  Abdominal Pain Associated symptoms: no chest pain, no cough, no diarrhea, no fever, no nausea, no shortness of breath and no vomiting     Past Medical History  Diagnosis Date  . Hypertension    History reviewed. No pertinent past surgical history. History reviewed. No pertinent family history. History  Substance Use Topics  . Smoking status: Current Every Day  Smoker    Types: Cigars, Cigarettes  . Smokeless tobacco: Not on file  . Alcohol Use: No    Review of Systems  Constitutional: Positive for activity change and appetite change. Negative for fever.  HENT: Negative.   Respiratory: Negative for cough and shortness of breath.   Cardiovascular: Negative for chest pain and leg swelling.  Gastrointestinal: Positive for abdominal pain. Negative for nausea, vomiting, diarrhea, anal bleeding and rectal pain.  Genitourinary: Negative.  Negative for enuresis.  Skin: Negative for color change and rash.  Neurological: Negative.     Allergies  Review of patient's allergies indicates no known allergies.  Home Medications   Prior to Admission medications   Medication Sig Start Date End Date Taking? Authorizing Provider  hydrochlorothiazide (HYDRODIURIL) 25 MG tablet Take 25 mg by mouth daily.   Yes Historical Provider, MD  HYDROcodone-acetaminophen (NORCO/VICODIN) 5-325 MG per tablet Take 1-2 tablets by mouth every 6 (six) hours as needed for moderate pain or severe pain. 09/13/13  Yes Courtney A Forcucci, PA-C  lisinopril (PRINIVIL,ZESTRIL) 20 MG tablet Take 20 mg by mouth every evening.   Yes Historical Provider, MD  sulfamethoxazole-trimethoprim (SEPTRA DS) 800-160 MG per tablet Take 1 tablet by mouth 2 (two) times daily. 09/13/13  Yes Courtney A Forcucci, PA-C  traMADol (ULTRAM) 50 MG tablet Take 50 mg by mouth every 6 (six) hours as needed for moderate pain (back  pain).    Historical Provider, MD   BP 150/120  Pulse 111  Temp(Src) 98.4 F (36.9 C) (Oral)  Resp 18  SpO2 100% Physical Exam  Nursing note and vitals reviewed. Constitutional: He is oriented to person, place, and time. He appears well-developed and well-nourished. No distress.  Eyes: Conjunctivae and EOM are normal.  Neck: Normal range of motion. Neck supple.  Cardiovascular: Normal rate, regular rhythm, normal heart sounds and intact distal pulses.   Pulmonary/Chest: Effort  normal and breath sounds normal. No respiratory distress. He has no wheezes. He has no rales.  Abdominal: He exhibits no mass.  Abdomen with frequent guarding advised  to relax and breathe through the examination. There is marked, severe tenderness to the left hemiabdomen. No tenderness to the right hemiabdomen. Guarding persists. Most of the abdomen percusses tympanic. Bowel sounds are hypoactive.  Musculoskeletal: He exhibits no edema.  Neurological: He is alert and oriented to person, place, and time. He exhibits normal muscle tone.  Skin: Skin is warm and dry. No rash noted.  Psychiatric: His speech is normal. His mood appears anxious.    ED Course  Procedures (including critical care time) Labs Review Labs Reviewed - No data to display  Imaging Review Ct Abdomen Pelvis W Contrast  09/13/2013   CLINICAL DATA:  Pain.  Bloody diarrhea.  EXAM: CT ABDOMEN AND PELVIS WITH CONTRAST  TECHNIQUE: Multidetector CT imaging of the abdomen and pelvis was performed using the standard protocol following bolus administration of intravenous contrast.  CONTRAST:  54mL OMNIPAQUE IOHEXOL 300 MG/ML SOLN, 137mL OMNIPAQUE IOHEXOL 300 MG/ML SOLN  COMPARISON:  None.  FINDINGS: No focal hepatic abnormality identified. Fatty infiltration of the liver present. Spleen is normal. Pancreas is normal. No biliary distention.  Adrenals are normal. Kidneys are normal. No hydronephrosis or obstructing ureteral stone. The bladder is nondistended. Calcification of the pelvis consistent with phleboliths.Prostate is not enlarged.  Aorta and its visceral branches widely patent.  Appendix is normal. No bowel distention. Mild rectal wall thickening is noted. Mild proctitis cannot be excluded . No inflammatory changes are left lower quadrant. No free air.  Lung bases are clear. Heart size normal. No acute bony abnormality.  IMPRESSION: Mild rectal wall thickening is noted. Mild proctitis cannot be excluded.   Electronically Signed   By:  Marcello Moores  Register   On: 09/13/2013 16:29     MDM   1. Generalized abdominal pain     I believe the patient has genuine abdominal pain however there is no clear etiology. The pain is moderate to severe and providing a more potent scheduled II narcotic for persistent abdominal pain without a clear etiology is not in the pt's best interest. He is advised to go to the emergency department for intractable abdominal pain while taking Norco. This instruction was noted within the ED visit notes. I have strongly recommended that the pt go to the ED now. He may need to be admitted and or call in a specialist for additional  E/M. The patient declines to go to the emergency department due to finances and his belief that he will not be helped. Burman Nieves is assisting with financial assistance while in the UC. Is unlikely that I would be able to get him into an office of a surgeon or gastroenterologist for an examination of moderate to severe abdominal pain. He has been advised that by not going to the ED his pain may worsen, he may have a ruptured viscus, bleeding, organ damage or other  severe condition that may even late to get up. He will sign AMA.    Janne Napoleon, NP 09/15/13 [phone]

## 2013-09-15 NOTE — Discharge Instructions (Signed)
Abdominal Pain °Many things can cause abdominal pain. Usually, abdominal pain is not caused by a disease and will improve without treatment. It can often be observed and treated at home. Your health care provider will do a physical exam and possibly order blood tests and X-rays to help determine the seriousness of your pain. However, in many cases, more time must pass before a clear cause of the pain can be found. Before that point, your health care provider may not know if you need more testing or further treatment. °HOME CARE INSTRUCTIONS  °Monitor your abdominal pain for any changes. The following actions may help to alleviate any discomfort you are experiencing: °· Only take over-the-counter or prescription medicines as directed by your health care provider. °· Do not take laxatives unless directed to do so by your health care provider. °· Try a clear liquid diet (broth, tea, or water) as directed by your health care provider. Slowly move to a bland diet as tolerated. °SEEK MEDICAL CARE IF: °· You have unexplained abdominal pain. °· You have abdominal pain associated with nausea or diarrhea. °· You have pain when you urinate or have a bowel movement. °· You experience abdominal pain that wakes you in the night. °· You have abdominal pain that is worsened or improved by eating food. °· You have abdominal pain that is worsened with eating fatty foods. °· You have a fever. °SEEK IMMEDIATE MEDICAL CARE IF:  °· Your pain does not go away within 2 hours. °· You keep throwing up (vomiting). °· Your pain is felt only in portions of the abdomen, such as the right side or the left lower portion of the abdomen. °· You pass bloody or black tarry stools. °MAKE SURE YOU: °· Understand these instructions.   °· Will watch your condition.   °· Will get help right away if you are not doing well or get worse.   °Document Released: 11/07/2004 Document Revised: 02/02/2013 Document Reviewed: 10/07/2012 °[employer]® Patient Information  ©2015 [employer]. This information is not intended to replace advice given to you by your health care provider. Make sure you discuss any questions you have with your health care provider. ° °Pain of Unknown Etiology (Pain Without a Known Cause) °You have come to your caregiver because of pain. Pain can occur in any part of the body. Often there is not a definite cause. If your laboratory (blood or urine) work was normal and X-rays or other studies were normal, your caregiver may treat you without knowing the cause of the pain. An example of this is the headache. Most headaches are diagnosed by taking a history. This means your caregiver asks you questions about your headaches. Your caregiver determines a treatment based on your answers. Usually testing done for headaches is normal. Often testing is not done unless there is no response to medications. Regardless of where your pain is located today, you can be given medications to make you comfortable. If no physical cause of pain can be found, most cases of pain will gradually leave as suddenly as they came.  °If you have a painful condition and no reason can be found for the pain, it is important that you follow up with your caregiver. If the pain becomes worse or does not go away, it may be necessary to repeat tests and look further for a possible cause. °· Only take over-the-counter or prescription medicines for pain, discomfort, or fever as directed by your caregiver. °· For the protection of your privacy, test results   cannot be given over the phone. Make sure you receive the results of your test. Ask how these results are to be obtained if you have not been informed. It is your responsibility to obtain your test results. °· You may continue all activities unless the activities cause more pain. When the pain lessens, it is important to gradually resume normal activities. Resume activities by beginning slowly and gradually increasing the intensity and duration  of the activities or exercise. During periods of severe pain, bed rest may be helpful. Lie or sit in any position that is comfortable. °· Ice used for acute (sudden) conditions may be effective. Use a large plastic bag filled with ice and wrapped in a towel. This may provide pain relief. °· See your caregiver for continued problems. Your caregiver can help or refer you for exercises or physical therapy if necessary. °If you were given medications for your condition, do not drive, operate machinery or power tools, or sign legal documents for 24 hours. Do not drink alcohol, take sleeping pills, or take other medications that may interfere with treatment. °See your caregiver immediately if you have pain that is becoming worse and not relieved by medications. °Document Released: 10/23/2000 Document Revised: 11/18/2012 Document Reviewed: 01/28/2005 °[employer]® Patient Information ©2015 [employer]. This information is not intended to replace advice given to you by your health care provider. Make sure you discuss any questions you have with your health care provider. ° °

## 2013-09-15 NOTE — ED Notes (Signed)
C/o  Severe abdominal pain.  Pain is worse with sitting up right and lying down.  No relief with pain meds.  States taking antibiotics as directed.

## 2014-05-19 ENCOUNTER — Emergency Department (HOSPITAL_COMMUNITY): Payer: Self-pay

## 2014-05-19 ENCOUNTER — Emergency Department (HOSPITAL_COMMUNITY)
Admission: EM | Admit: 2014-05-19 | Discharge: 2014-05-19 | Disposition: A | Discharge status: Home / Self Care | Payer: Self-pay | Attending: Emergency Medicine | Admitting: Emergency Medicine

## 2014-05-19 ENCOUNTER — Encounter (HOSPITAL_COMMUNITY): Payer: Self-pay

## 2014-05-19 DIAGNOSIS — Z720 Tobacco use: Secondary | ICD-10-CM | POA: Insufficient documentation

## 2014-05-19 DIAGNOSIS — R109 Unspecified abdominal pain: Secondary | ICD-10-CM

## 2014-05-19 DIAGNOSIS — K640 First degree hemorrhoids: Secondary | ICD-10-CM | POA: Insufficient documentation

## 2014-05-19 DIAGNOSIS — I10 Essential (primary) hypertension: Secondary | ICD-10-CM | POA: Insufficient documentation

## 2014-05-19 DIAGNOSIS — Z792 Long term (current) use of antibiotics: Secondary | ICD-10-CM | POA: Insufficient documentation

## 2014-05-19 DIAGNOSIS — K921 Melena: Secondary | ICD-10-CM | POA: Insufficient documentation

## 2014-05-19 LAB — COMPREHENSIVE METABOLIC PANEL
ALBUMIN: 5.1 g/dL (ref 3.5–5.2)
ALT: 15 U/L (ref 0–53)
AST: 21 U/L (ref 0–37)
Alkaline Phosphatase: 108 U/L (ref 39–117)
Anion gap: 9 (ref 5–15)
BUN: 14 mg/dL (ref 6–23)
CALCIUM: 9.6 mg/dL (ref 8.4–10.5)
CO2: 23 mmol/L (ref 19–32)
Chloride: 103 mmol/L (ref 96–112)
Creatinine, Ser: 0.95 mg/dL (ref 0.50–1.35)
Glucose, Bld: 109 mg/dL — ABNORMAL HIGH (ref 70–99)
Potassium: 4.1 mmol/L (ref 3.5–5.1)
SODIUM: 135 mmol/L (ref 135–145)
Total Bilirubin: 1 mg/dL (ref 0.3–1.2)
Total Protein: 8.2 g/dL (ref 6.0–8.3)

## 2014-05-19 LAB — CBC WITH DIFFERENTIAL/PLATELET
BASOS PCT: 0 % (ref 0–1)
Basophils Absolute: 0 10*3/uL (ref 0.0–0.1)
EOS PCT: 1 % (ref 0–5)
Eosinophils Absolute: 0.2 10*3/uL (ref 0.0–0.7)
HCT: 42.3 % (ref 39.0–52.0)
Hemoglobin: 14.8 g/dL (ref 13.0–17.0)
LYMPHS ABS: 2.3 10*3/uL (ref 0.7–4.0)
Lymphocytes Relative: 16 % (ref 12–46)
MCH: 33 pg (ref 26.0–34.0)
MCHC: 35 g/dL (ref 30.0–36.0)
MCV: 94.2 fL (ref 78.0–100.0)
MONO ABS: 0.9 10*3/uL (ref 0.1–1.0)
Monocytes Relative: 6 % (ref 3–12)
Neutro Abs: 11.4 10*3/uL — ABNORMAL HIGH (ref 1.7–7.7)
Neutrophils Relative %: 77 % (ref 43–77)
Platelets: 286 10*3/uL (ref 150–400)
RBC: 4.49 MIL/uL (ref 4.22–5.81)
RDW: 14 % (ref 11.5–15.5)
WBC: 14.8 10*3/uL — ABNORMAL HIGH (ref 4.0–10.5)

## 2014-05-19 LAB — POC OCCULT BLOOD, ED: FECAL OCCULT BLD: NEGATIVE

## 2014-05-19 LAB — LIPASE, BLOOD: Lipase: 23 U/L (ref 11–59)

## 2014-05-19 MED ORDER — AMOXICILLIN-POT CLAVULANATE 875-125 MG PO TABS: 1.0000 | ORAL_TABLET | Freq: Two times a day (BID) | ORAL | Status: DC

## 2014-05-19 MED ORDER — POLYETHYLENE GLYCOL 3350 17 G PO PACK: 17.0000 g | PACK | Freq: Every day | ORAL | Status: DC

## 2014-05-19 MED ORDER — LISINOPRIL 20 MG PO TABS: 20.0000 mg | ORAL_TABLET | Freq: Every day | ORAL | Status: DC

## 2014-05-19 MED ORDER — HYDROCORTISONE 2.5 % RE CREA: TOPICAL_CREAM | RECTAL | Status: DC

## 2014-05-19 MED ORDER — HYDROCHLOROTHIAZIDE 25 MG PO TABS: 25.0000 mg | ORAL_TABLET | Freq: Every day | ORAL | Status: DC

## 2014-05-19 MED ORDER — HYDROCODONE-ACETAMINOPHEN 5-325 MG PO TABS: 1.0000 | ORAL_TABLET | Freq: Four times a day (QID) | ORAL | Status: DC | PRN

## 2014-05-19 MED ORDER — IOHEXOL 300 MG/ML  SOLN: 50.0000 mL | Freq: Once | INTRAMUSCULAR | Status: DC | PRN

## 2014-05-19 NOTE — ED Notes (Signed)
Patient states he has had mid-lower abdominal pain and bright red blood per rectum since yesterday.  Patient states she has had 2 loose stools with bright red blood since yesterday - once yesterday and once today.  Patient denies N/V/D and fever.  Patient SOB, weakness and chest pain.  On exam, patient's lung sounds are clear with no wheezing, rales or rhonchi.  Cardiac sound WNL S1/S2, no rub, gallop or murmur heard.  +3 radial and pedal pulses.  No pre-tibial or pedal edema noted.  Bowel sounds hypoactive, increased pain to abdominal palpation.

## 2014-05-19 NOTE — Discharge Instructions (Signed)
Take augmentin twice daily for a week.   Take miralax to keep your stools soft. Use anusol cream for hemorrhoids. Try sitz bath twice daily.   Take norco for severe pain. Do NOT drive with it.   Your blood pressure is high. Take hctz, lisinopril as prescribed.   You need to see a primary care doctor.   See a GI doctor.   Return to ER if you have severe pain, fever, vomiting.

## 2014-05-19 NOTE — ED Notes (Signed)
Pain in abdomen x 2 days.  No n/v.  Liquid and solid stool.  No fever.  Blood in stool last night.  Also c/o back pain.

## 2014-05-19 NOTE — ED Provider Notes (Signed)
CSN: [id number]     Arrival date & time 05/19/14  1112 History   First MD Initiated Contact with Patient 05/19/14 1512     Chief Complaint  Patient presents with  . Rectal Bleeding  . Abdominal Pain     (Consider location/radiation/quality/duration/timing/severity/associated sxs/prior Treatment) The history is provided by the patient.  Caleb Hubbard is a 34 y.o. male hx of HTN, who presenting with abdominal pain. Abdominal pain for the last 2 days. States that he has right upper quadrant pain. He also has 2 loose stools since yesterday. States that his stool was brown but blood-tinged. Denies any nausea or vomiting but has not been eating much. Is told to come here for evaluation by his work. Of note he has not been taking this hydrocodone thiazide or lisinopril but denies any chest pain or headache or weakness. He was also in the ER August of last year and was diagnosed with proctitis and was prescribed a course of Septra.    Past Medical History  Diagnosis Date  . Hypertension    History reviewed. No pertinent past surgical history. History reviewed. No pertinent family history. History  Substance Use Topics  . Smoking status: Current Every Day Smoker    Types: Cigars, Cigarettes  . Smokeless tobacco: Not on file  . Alcohol Use: No    Review of Systems  Gastrointestinal: Positive for abdominal pain and hematochezia.  All other systems reviewed and are negative.     Allergies  Review of patient's allergies indicates no known allergies.  Home Medications   Prior to Admission medications   Medication Sig Start Date End Date Taking? Authorizing Provider  acetaminophen (TYLENOL) 325 MG tablet Take 1,300 mg by mouth every 6 (six) hours as needed for mild pain, moderate pain or headache.   Yes Historical Provider, MD  HYDROcodone-acetaminophen (NORCO/VICODIN) 5-325 MG per tablet Take 1-2 tablets by mouth every 6 (six) hours as needed for moderate pain or severe pain. Patient not  taking: Reported on 05/19/2014 09/13/13   Loma Sousa Forcucci, PA-C  sulfamethoxazole-trimethoprim (SEPTRA DS) 800-160 MG per tablet Take 1 tablet by mouth 2 (two) times daily. Patient not taking: Reported on 05/19/2014 09/13/13   Loma Sousa Forcucci, PA-C   BP 185/123 mmHg  Pulse 97  Temp(Src) 98.8 F (37.1 C) (Oral)  Resp 16  SpO2 100% Physical Exam  Constitutional: He is oriented to person, place, and time. He appears well-developed and well-nourished.  HENT:  Head: Normocephalic.  Mouth/Throat: Oropharynx is clear and moist.  Eyes: Conjunctivae are normal. Pupils are equal, round, and reactive to light.  Neck: Normal range of motion.  Cardiovascular: Normal rate, regular rhythm and normal heart sounds.   Pulmonary/Chest: Effort normal and breath sounds normal. No respiratory distress. He has no wheezes. He has no rales.  Abdominal: Soft. Bowel sounds are normal.  Mild RUQ tenderness, no rebound. Mild LLQ tenderness, no rebound. Small tender lymph node R groin, mobile, no sign of abscess   Genitourinary:  Rectal- external hemorrhoid, not thrombosed. Prostate not tender   Musculoskeletal: Normal range of motion. He exhibits no edema or tenderness.  Neurological: He is alert and oriented to person, place, and time.  Skin: Skin is warm and dry.  Psychiatric: He has a normal mood and affect. His behavior is normal. Judgment and thought content normal.  Nursing note and vitals reviewed.   ED Course  Procedures (including critical care time)   EMERGENCY DEPARTMENT US GALLBLADDER INTERPRETATION "Study: Limited Ultrasound of the gallbladder and common  bile duct."  INDICATIONS: Abdominal pain and RUQ pain Indication: Multiple views of the gallbladder and common bile duct are obtained with a  Multi-frequency probe."  PERFORMED BY:  Myself  IMAGES ARCHIVED?: Yes  FINDINGS: Gallstones absent and Gallbladder wall normal in thickness  LIMITATIONS: Abdominal pain  INTERPRETATION:  Normal  COMMENT:  No gallstones    Labs Review Labs Reviewed  CBC WITH DIFFERENTIAL/PLATELET - Abnormal; Notable for the following:    WBC 14.8 (*)    Neutro Abs 11.4 (*)    All other components within normal limits  COMPREHENSIVE METABOLIC PANEL - Abnormal; Notable for the following:    Glucose, Bld 109 (*)    All other components within normal limits  LIPASE, BLOOD  URINALYSIS, ROUTINE W REFLEX MICROSCOPIC  POC OCCULT BLOOD, ED    Imaging Review No results found.   EKG Interpretation None      MDM   Final diagnoses:  Abdominal pain    Caleb Hubbard is a 34 y.o. male here with ab pain. Mild RUQ and LLQ tenderness. Beside US showed no gallstones. WBC 15, I think likely colitis, also consider IBD as well. I offered CT ab/pel to further assess but patient has no insurance so refused. Also hypertensive but hasn't been taking meds. No signs of hypertensive emergency. Will dc with norco, augmentin, hctz, lisinopril.    Wandra Arthurs, MD 05/19/14 [phone]

## 2016-04-15 ENCOUNTER — Emergency Department (HOSPITAL_COMMUNITY)
Admission: EM | Admit: 2016-04-15 | Discharge: 2016-04-15 | Disposition: A | Discharge status: Home / Self Care | Payer: Self-pay | Attending: Emergency Medicine | Admitting: Emergency Medicine

## 2016-04-15 ENCOUNTER — Encounter (HOSPITAL_COMMUNITY): Payer: Self-pay | Admitting: Emergency Medicine

## 2016-04-15 DIAGNOSIS — S0181XA Laceration without foreign body of other part of head, initial encounter: Secondary | ICD-10-CM | POA: Insufficient documentation

## 2016-04-15 DIAGNOSIS — Y929 Unspecified place or not applicable: Secondary | ICD-10-CM | POA: Insufficient documentation

## 2016-04-15 DIAGNOSIS — I10 Essential (primary) hypertension: Secondary | ICD-10-CM | POA: Insufficient documentation

## 2016-04-15 DIAGNOSIS — Y999 Unspecified external cause status: Secondary | ICD-10-CM | POA: Insufficient documentation

## 2016-04-15 DIAGNOSIS — Z23 Encounter for immunization: Secondary | ICD-10-CM | POA: Insufficient documentation

## 2016-04-15 DIAGNOSIS — W503XXA Accidental bite by another person, initial encounter: Secondary | ICD-10-CM

## 2016-04-15 DIAGNOSIS — F17210 Nicotine dependence, cigarettes, uncomplicated: Secondary | ICD-10-CM | POA: Insufficient documentation

## 2016-04-15 DIAGNOSIS — Y939 Activity, unspecified: Secondary | ICD-10-CM | POA: Insufficient documentation

## 2016-04-15 MED ORDER — IBUPROFEN 800 MG PO TABS
800.0000 mg | ORAL_TABLET | Freq: Once | ORAL | Status: AC
  Administered 2016-04-15: 800 mg via ORAL
  Filled 2016-04-15: qty 1

## 2016-04-15 MED ORDER — AMOXICILLIN-POT CLAVULANATE 875-125 MG PO TABS: 1.0000 | ORAL_TABLET | Freq: Two times a day (BID) | ORAL | 0 refills | Status: DC

## 2016-04-15 MED ORDER — TETANUS-DIPHTH-ACELL PERTUSSIS 5-2.5-18.5 LF-MCG/0.5 IM SUSP
0.5000 mL | Freq: Once | INTRAMUSCULAR | Status: AC
  Administered 2016-04-15: 0.5 mL via INTRAMUSCULAR
  Filled 2016-04-15: qty 0.5
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
## 2016-04-15 NOTE — Discharge Instructions (Signed)
Keep wounds clean and dry with soap and warm water. Take Augmentin as directed. Follow-up with your [facility] if any ongoing issues. Return here for new concerns.
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
## 2016-08-07 ENCOUNTER — Emergency Department (HOSPITAL_COMMUNITY): Payer: Self-pay

## 2016-08-07 ENCOUNTER — Emergency Department (HOSPITAL_COMMUNITY)
Admission: EM | Admit: 2016-08-07 | Discharge: 2016-08-07 | Disposition: A | Discharge status: Home / Self Care | Payer: Self-pay | Attending: Emergency Medicine | Admitting: Emergency Medicine

## 2016-08-07 ENCOUNTER — Encounter (HOSPITAL_COMMUNITY): Payer: Self-pay | Admitting: *Deleted

## 2016-08-07 ENCOUNTER — Other Ambulatory Visit: Payer: Self-pay

## 2016-08-07 DIAGNOSIS — F17290 Nicotine dependence, other tobacco product, uncomplicated: Secondary | ICD-10-CM | POA: Insufficient documentation

## 2016-08-07 DIAGNOSIS — R072 Precordial pain: Secondary | ICD-10-CM

## 2016-08-07 DIAGNOSIS — I10 Essential (primary) hypertension: Secondary | ICD-10-CM

## 2016-08-07 DIAGNOSIS — Z79899 Other long term (current) drug therapy: Secondary | ICD-10-CM | POA: Insufficient documentation

## 2016-08-07 LAB — CBC WITH DIFFERENTIAL/PLATELET
BASOS ABS: 0 10*3/uL (ref 0.0–0.1)
Basophils Relative: 0 %
Eosinophils Absolute: 0.2 10*3/uL (ref 0.0–0.7)
Eosinophils Relative: 1 %
HCT: 37.1 % — ABNORMAL LOW (ref 39.0–52.0)
Hemoglobin: 13.3 g/dL (ref 13.0–17.0)
LYMPHS ABS: 2.3 10*3/uL (ref 0.7–4.0)
Lymphocytes Relative: 21 %
MCH: 33.8 pg (ref 26.0–34.0)
MCHC: 35.8 g/dL (ref 30.0–36.0)
MCV: 94.2 fL (ref 78.0–100.0)
MONO ABS: 0.7 10*3/uL (ref 0.1–1.0)
Monocytes Relative: 6 %
Neutro Abs: 7.7 10*3/uL (ref 1.7–7.7)
Neutrophils Relative %: 72 %
Platelets: 290 10*3/uL (ref 150–400)
RBC: 3.94 MIL/uL — ABNORMAL LOW (ref 4.22–5.81)
RDW: 13.1 % (ref 11.5–15.5)
WBC: 10.9 10*3/uL — AB (ref 4.0–10.5)

## 2016-08-07 LAB — BASIC METABOLIC PANEL
ANION GAP: 10 (ref 5–15)
BUN: 16 mg/dL (ref 6–20)
CHLORIDE: 100 mmol/L — AB (ref 101–111)
CO2: 28 mmol/L (ref 22–32)
Calcium: 9.6 mg/dL (ref 8.9–10.3)
Creatinine, Ser: 0.96 mg/dL (ref 0.61–1.24)
GFR calc Af Amer: >60 mL/min (ref >60)
GFR calc non Af Amer: >60 mL/min (ref >60)
GLUCOSE: 95 mg/dL (ref 65–99)
Potassium: 4.5 mmol/L (ref 3.5–5.1)
Sodium: 138 mmol/L (ref 135–145)

## 2016-08-07 LAB — POCT I-STAT TROPONIN I: Troponin i, poc: 0 ng/mL (ref 0.00–0.08)

## 2016-08-07 LAB — D-DIMER, QUANTITATIVE: D-Dimer, Quant: <0.27 ug/mL-FEU (ref 0.00–0.50)

## 2016-08-07 MED ORDER — ACETAMINOPHEN 500 MG PO TABS: 500.0000 mg | ORAL_TABLET | Freq: Four times a day (QID) | ORAL | 0 refills | Status: DC | PRN

## 2016-08-07 NOTE — ED Notes (Signed)
Patient states he did not take his BP meds this AM

## 2016-08-07 NOTE — ED Notes (Signed)
RN starting line and collecting blood work 

## 2016-08-07 NOTE — ED Provider Notes (Signed)
Caleb Hubbard Provider Note   CSN: [id number] Arrival date & time: 08/07/16  0736     History   Chief Complaint Chief Complaint  Patient presents with  . Chest Pain    HPI Caleb Hubbard is a 36 y.o. male.  Caleb Hubbard is a 36 y.o. Male with history of hypertension who presents to the emergency department complaining of right-sided chest pain ongoing for more than a month. He tells me his pain is worse with certain positions and seems to be better lying flat. He reported sitting up or standing up and moving his arm can exacerbate his pain. He reports he has some increased pain with deep inspiration, but otherwise denies any shortness of breath. He has taken nothing for treatment of his symptoms today. He denies personal or close family history of MI, PE or DVT. He denies recent long travel. He is a smoker. He denies fevers, shortness of breath, palpitations, arm injury, abdominal pain, nausea, vomiting, leg pain, leg swelling, lightheadedness, dizziness or syncope.   The history is provided by the patient and medical records. No language interpreter was used.  Chest Pain   Pertinent negatives include no abdominal pain, no back pain, no cough, no dizziness, no fever, no headaches, no nausea, no palpitations, no shortness of breath, no vomiting and no weakness.    Past Medical History:  Diagnosis Date  . Hypertension     There are no active problems to display for this patient.   History reviewed. No pertinent surgical history.     Home Medications    Prior to Admission medications   Medication Sig Start Date End Date Taking? Authorizing Provider  acetaminophen (TYLENOL) 500 MG tablet Take 1 tablet (500 mg total) by mouth every 6 (six) hours as needed. 08/07/16   Waynetta Pean, PA-C  amoxicillin-clavulanate (AUGMENTIN) 875-125 MG tablet Take 1 tablet by mouth 2 (two) times daily. 04/15/16   Larene Pickett, PA-C  hydrochlorothiazide (HYDRODIURIL) 25 MG tablet Take 1  tablet (25 mg total) by mouth daily. 05/19/14   Drenda Freeze, MD  hydrocortisone (ANUSOL-HC) 2.5 % rectal cream Apply rectally 2 times daily 05/19/14   Drenda Freeze, MD  lisinopril (PRINIVIL,ZESTRIL) 20 MG tablet Take 1 tablet (20 mg total) by mouth daily. 05/19/14   Drenda Freeze, MD  polyethylene glycol [facility] / Floria Raveling) packet Take 17 g by mouth daily. 05/19/14   Drenda Freeze, MD    Family History History reviewed. No pertinent family history.  Social History Social History  Substance Use Topics  . Smoking status: Current Every Day Smoker    Types: Cigars, Cigarettes  . Smokeless tobacco: Never Used  . Alcohol use No     Allergies   Patient has no known allergies.   Review of Systems Review of Systems  Constitutional: Negative for chills and fever.  HENT: Negative for congestion and sore throat.   Eyes: Negative for visual disturbance.  Respiratory: Negative for cough, shortness of breath and wheezing.   Cardiovascular: Positive for chest pain. Negative for palpitations and leg swelling.  Gastrointestinal: Negative for abdominal pain, diarrhea, nausea and vomiting.  Genitourinary: Negative for dysuria.  Musculoskeletal: Positive for arthralgias. Negative for back pain and neck pain.  Skin: Negative for rash.  Neurological: Negative for dizziness, syncope, weakness, light-headedness and headaches.     Physical Exam Updated Vital Signs BP (!) 176/105   Pulse 72   Temp 98.8 F (37.1 C) (Oral)   Resp 20   Ht  5\' 10"  (1.778 m)   Wt 68.9 kg (152 lb)   SpO2 100%   BMI 21.81 kg/m   Physical Exam  Constitutional: He is oriented to person, place, and time. He appears well-developed and well-nourished. No distress.  Nontoxic appearing.  HENT:  Head: Normocephalic and atraumatic.  Mouth/Throat: Oropharynx is clear and moist.  Eyes: Conjunctivae are normal. Pupils are equal, round, and reactive to light. Right eye exhibits no discharge. Left eye  exhibits no discharge.  Neck: Normal range of motion. Neck supple. No JVD present. No tracheal deviation present.  Cardiovascular: Normal rate, regular rhythm, normal heart sounds and intact distal pulses.  Exam reveals no gallop and no friction rub.   No murmur heard. Bilateral radial, posterior tibialis and dorsalis pedis pulses are intact.    Pulmonary/Chest: Effort normal and breath sounds normal. No stridor. No respiratory distress. He has no wheezes. He has no rales. He exhibits tenderness.  Lungs are clear to ascultation bilaterally. Symmetric chest expansion bilaterally. No increased work of breathing. No rales or rhonchi.  Mild right upper chest wall TTP.   Abdominal: Soft. There is no tenderness. There is no guarding.  Musculoskeletal: Normal range of motion. He exhibits no edema, tenderness or deformity.  Good ROM of his right shoulder. He reports increased pain to his right upper chest with ROM of his right shoulder. No LE edema or TTP.   Lymphadenopathy:    He has no cervical adenopathy.  Neurological: He is alert and oriented to person, place, and time. No sensory deficit. He exhibits normal muscle tone. Coordination normal.  Skin: Skin is warm and dry. Capillary refill takes less than 2 seconds. No rash noted. He is not diaphoretic. No erythema. No pallor.  Psychiatric: He has a normal mood and affect. His behavior is normal.  Nursing note and vitals reviewed.    ED Treatments / Results  Labs (all labs ordered are listed, but only abnormal results are displayed) Labs Reviewed  BASIC METABOLIC PANEL - Abnormal; Notable for the following:       Result Value   Chloride 100 (*)    All other components within normal limits  CBC WITH DIFFERENTIAL/PLATELET - Abnormal; Notable for the following:    WBC 10.9 (*)    RBC 3.94 (*)    HCT 37.1 (*)    All other components within normal limits  D-DIMER, QUANTITATIVE (NOT AT [facility])  I-STAT TROPOININ, ED  POCT I-STAT TROPONIN I     EKG  EKG Interpretation  Date/Time:  Wednesday August 07 2016 08:05:05 EDT Ventricular Rate:  92 PR Interval:    QRS Duration: 90 QT Interval:  324 QTC Calculation: 401 R Axis:   60 Text Interpretation:  Sinus rhythm LAE, consider biatrial enlargement Nonspecific T abnormalities, inferior leads Baseline wander in lead(s) V6 No old tracing to compare Confirmed by Orlie Dakin [phone]) on 08/07/2016 8:11:14 AM Also confirmed by Orlie Dakin [phone]), editor Laurena Spies [phone])  on 08/07/2016 8:38:09 AM       Radiology Dg Chest 2 View  Result Date: 08/07/2016 CLINICAL DATA:  Chest pain. EXAM: CHEST  2 VIEW COMPARISON:  No prior . FINDINGS: Mediastinum and hilar structures normal. Questionable pulmonary nodule versus overlying structures. Repeat PA lateral chest x-Mejorado suggested. If this nodular density persists CT can be obtained. Lungs are otherwise clear. No pleural effusion or pneumothorax. Thoracic spine scoliosis. No pneumothorax. Mild thoracic spine scoliosis. IMPRESSION: Questionable tiny pulmonary nodule right mid lung versus overlying structures. Repeat PA and  lateral chest x-Widmer suggested. If this nodular density persists nonenhanced chest CT can be obtained. No acute cardiopulmonary disease otherwise noted. Electronically Signed   By: Marcello Moores  Register   On: 08/07/2016 08:07    Procedures Procedures (including critical care time)  Medications Ordered in ED Medications - No data to display   Initial Impression / Assessment and Plan / ED Course  I have reviewed the triage vital signs and the nursing notes.  Pertinent labs & imaging results that were available during my care of the patient were reviewed by me and considered in my medical decision making (see chart for details).    This is a 36 y.o. Male with history of hypertension who presents to the emergency department complaining of right-sided chest pain ongoing for more than a month. He tells me his pain is  worse with certain positions and seems to be better lying flat. He reported sitting up or standing up and moving his arm can exacerbate his pain. He reports he has some increased pain with deep inspiration, but otherwise denies any shortness of breath. He has taken nothing for treatment of his symptoms today. He denies personal or close family history of MI, PE or DVT. He denies recent long travel. He is a smoker. On examination patient is afebrile nontoxic appearing. His lungs are clear to auscultation bilaterally. He has no tachypnea, tachycardia or hypoxia on exam. His EKG shows no evidence of STEMI. No previous to compare to. Troponin is not elevated. I see no need for repeat troponin as patient has been having ongoing pain for a month. CBC is unremarkable. BMP is unremarkable. D-dimer is not elevated. I have low suspicion for PE in this patient. Chest x-Jarrard shows a question multiple pulmonary nodule at the right mid lung versus overlying structure. I discussed this with the patient. I doubt this is the cause of his pain. I did encourage him to follow-up with his primary care doctor and have this re-x-rayed and a few weeks. He agrees with this plan. I also encouraged him to follow-up for repeat check of his blood pressure. I discussed strict and specific return precautions. I advised the patient to follow-up with their primary care provider this week. I advised the patient to return to the emergency department with new or worsening symptoms or new concerns. The patient verbalized understanding and agreement with plan.      Final Clinical Impressions(s) / ED Diagnoses   Final diagnoses:  Precordial pain  Essential hypertension    New Prescriptions Discharge Medication List as of 08/07/2016  3:05 PM       Waynetta Pean, PA-C 08/07/16 [place], MD 08/07/16 [phone]

## 2016-08-07 NOTE — ED Triage Notes (Signed)
Patient is alert and oriented x4.  He is complaining of right chest pain that has been on going for a month.  Patient describes his chest pain as tightness at a level of 10 of 10.  Patient adds that he has to put extra effort in to taking a breath and that it causes pain in his right chest.

## 2017-03-12 ENCOUNTER — Encounter (HOSPITAL_COMMUNITY): Payer: Self-pay | Admitting: Emergency Medicine

## 2017-03-12 ENCOUNTER — Emergency Department (HOSPITAL_COMMUNITY)
Admission: EM | Admit: 2017-03-12 | Discharge: 2017-03-12 | Discharge status: Against Medical Advice | Payer: Self-pay | Attending: Emergency Medicine | Admitting: Emergency Medicine

## 2017-03-12 ENCOUNTER — Emergency Department (HOSPITAL_COMMUNITY): Payer: Self-pay

## 2017-03-12 DIAGNOSIS — N50819 Testicular pain, unspecified: Secondary | ICD-10-CM | POA: Insufficient documentation

## 2017-03-12 DIAGNOSIS — Z5321 Procedure and treatment not carried out due to patient leaving prior to being seen by health care provider: Secondary | ICD-10-CM | POA: Insufficient documentation

## 2017-03-12 NOTE — ED Notes (Signed)
Per registration, patient reports he is leaving.

## 2017-03-12 NOTE — ED Triage Notes (Signed)
Patient c/o right testicle pain x3 weeks. Reports over the past two days pain has increased. Denies urinary sx and penile discharge.

## 2017-05-15 ENCOUNTER — Emergency Department (HOSPITAL_COMMUNITY): Payer: Self-pay

## 2017-05-15 ENCOUNTER — Encounter (HOSPITAL_COMMUNITY): Payer: Self-pay | Admitting: *Deleted

## 2017-05-15 ENCOUNTER — Emergency Department (HOSPITAL_COMMUNITY)
Admission: EM | Admit: 2017-05-15 | Discharge: 2017-05-15 | Disposition: A | Discharge status: Home / Self Care | Payer: Self-pay | Attending: Emergency Medicine | Admitting: Emergency Medicine

## 2017-05-15 DIAGNOSIS — R1084 Generalized abdominal pain: Secondary | ICD-10-CM | POA: Insufficient documentation

## 2017-05-15 DIAGNOSIS — I10 Essential (primary) hypertension: Secondary | ICD-10-CM | POA: Insufficient documentation

## 2017-05-15 DIAGNOSIS — Z79899 Other long term (current) drug therapy: Secondary | ICD-10-CM | POA: Insufficient documentation

## 2017-05-15 DIAGNOSIS — F17210 Nicotine dependence, cigarettes, uncomplicated: Secondary | ICD-10-CM | POA: Insufficient documentation

## 2017-05-15 LAB — URINALYSIS, ROUTINE W REFLEX MICROSCOPIC
Bilirubin Urine: NEGATIVE
Glucose, UA: NEGATIVE mg/dL
Hgb urine dipstick: NEGATIVE
Ketones, ur: NEGATIVE mg/dL
Leukocytes, UA: NEGATIVE
Nitrite: NEGATIVE
Protein, ur: NEGATIVE mg/dL
Specific Gravity, Urine: >1.046 — ABNORMAL HIGH (ref 1.005–1.030)
pH: 6 (ref 5.0–8.0)

## 2017-05-15 LAB — COMPREHENSIVE METABOLIC PANEL WITH GFR
ALT: 10 U/L — ABNORMAL LOW (ref 17–63)
AST: 17 U/L (ref 15–41)
Albumin: 4.4 g/dL (ref 3.5–5.0)
Alkaline Phosphatase: 109 U/L (ref 38–126)
Anion gap: 9 (ref 5–15)
BUN: 9 mg/dL (ref 6–20)
CO2: 25 mmol/L (ref 22–32)
Calcium: 9.9 mg/dL (ref 8.9–10.3)
Chloride: 104 mmol/L (ref 101–111)
Creatinine, Ser: 0.96 mg/dL (ref 0.61–1.24)
GFR calc Af Amer: >60 mL/min
GFR calc non Af Amer: >60 mL/min
Glucose, Bld: 123 mg/dL — ABNORMAL HIGH (ref 65–99)
Potassium: 3.9 mmol/L (ref 3.5–5.1)
Sodium: 138 mmol/L (ref 135–145)
Total Bilirubin: 0.7 mg/dL (ref 0.3–1.2)
Total Protein: 8.2 g/dL — ABNORMAL HIGH (ref 6.5–8.1)

## 2017-05-15 LAB — LIPASE, BLOOD: Lipase: 31 U/L (ref 11–51)

## 2017-05-15 LAB — CBC
HEMATOCRIT: 44.3 % (ref 39.0–52.0)
Hemoglobin: 15.1 g/dL (ref 13.0–17.0)
MCH: 33.3 pg (ref 26.0–34.0)
MCHC: 34.1 g/dL (ref 30.0–36.0)
MCV: 97.8 fL (ref 78.0–100.0)
PLATELETS: 348 10*3/uL (ref 150–400)
RBC: 4.53 MIL/uL (ref 4.22–5.81)
RDW: 13.4 % (ref 11.5–15.5)
WBC: 13 10*3/uL — AB (ref 4.0–10.5)

## 2017-05-15 MED ORDER — HYDROCHLOROTHIAZIDE 25 MG PO TABS: 25.0000 mg | ORAL_TABLET | Freq: Every day | ORAL | 1 refills | Status: DC

## 2017-05-15 MED ORDER — ONDANSETRON HCL 4 MG/2ML IJ SOLN
4.0000 mg | Freq: Once | INTRAMUSCULAR | Status: AC
  Administered 2017-05-15: 4 mg via INTRAVENOUS
  Filled 2017-05-15: qty 2

## 2017-05-15 MED ORDER — HYDROCODONE-ACETAMINOPHEN 5-325 MG PO TABS: 1.0000 | ORAL_TABLET | ORAL | 0 refills | Status: DC | PRN

## 2017-05-15 MED ORDER — LISINOPRIL 20 MG PO TABS: 20.0000 mg | ORAL_TABLET | Freq: Every day | ORAL | 1 refills | Status: DC

## 2017-05-15 MED ORDER — FENTANYL CITRATE (PF) 100 MCG/2ML IJ SOLN
100.0000 ug | INTRAMUSCULAR | Status: DC | PRN
  Administered 2017-05-15: 100 ug via INTRAVENOUS
  Filled 2017-05-15: qty 2

## 2017-05-15 MED ORDER — IOPAMIDOL (ISOVUE-300) INJECTION 61%
100.0000 mL | Freq: Once | INTRAVENOUS | Status: AC | PRN
  Administered 2017-05-15: 100 mL via INTRAVENOUS

## 2017-05-15 MED ORDER — IOPAMIDOL (ISOVUE-300) INJECTION 61%
INTRAVENOUS | Status: AC
  Filled 2017-05-15: qty 100

## 2017-05-15 NOTE — ED Provider Notes (Signed)
[facility] DEPT Provider Note   CSN: [id number] Arrival date & time: 05/15/17  0957     History   Chief Complaint Chief Complaint  Patient presents with  . Abdominal Pain    HPI Caleb Hubbard is a 37 y.o. male.  He presents for evaluation of abdominal pain, present since yesterday, after awakening from a nap.  Pain is been waxing and waning but moderately severe.  Pain was initially left upper quadrant, now is diffusely on the right side.  He had normal bowel well prior to onset of the pain yesterday.  He occasionally gets constipated but does not feel like this is constipation.  He feels somewhat nauseated but has not vomited.  He denies fever, chills, cough, chest pain, weakness or dizziness.  He had a similar pain last year was evaluated but no adverse findings were present.  He smokes cigarettes.  He is not currently employed.  There are no other known modifying factors.  HPI  Past Medical History:  Diagnosis Date  . Hypertension     There are no active problems to display for this patient.   History reviewed. No pertinent surgical history.      Home Medications    Prior to Admission medications   Medication Sig Start Date End Date Taking? Authorizing Provider  acetaminophen (TYLENOL) 500 MG tablet Take 1 tablet (500 mg total) by mouth every 6 (six) hours as needed. 08/07/16  Yes Waynetta Pean, PA-C  hydrocortisone (ANUSOL-HC) 2.5 % rectal cream Apply rectally 2 times daily 05/19/14  Yes Drenda Freeze, MD  polyethylene glycol [facility] / GLYCOLAX) packet Take 17 g by mouth daily. 05/19/14  Yes Drenda Freeze, MD  amoxicillin-clavulanate (AUGMENTIN) 875-125 MG tablet Take 1 tablet by mouth 2 (two) times daily. Patient not taking: Reported on 05/15/2017 04/15/16   Larene Pickett, PA-C  hydrochlorothiazide (HYDRODIURIL) 25 MG tablet Take 1 tablet (25 mg total) by mouth daily. 05/15/17   Daleen Bo, MD  HYDROcodone-acetaminophen (NORCO)  5-325 MG tablet Take 1 tablet by mouth every 4 (four) hours as needed for moderate pain. 05/15/17   Daleen Bo, MD  lisinopril (PRINIVIL,ZESTRIL) 20 MG tablet Take 1 tablet (20 mg total) by mouth daily. 05/15/17   Daleen Bo, MD    Family History No family history on file.  Social History Social History   Tobacco Use  . Smoking status: Current Every Day Smoker    Types: Cigars, Cigarettes  . Smokeless tobacco: Never Used  Substance Use Topics  . Alcohol use: No  . Drug use: Yes    Types: Marijuana     Allergies   Patient has no known allergies.   Review of Systems Review of Systems  All other systems reviewed and are negative.    Physical Exam Updated Vital Signs BP (!) 186/115 (BP Location: Left Arm)   Pulse 85   Temp (!) 97.5 F (36.4 C) (Oral)   Resp 18   SpO2 99%   Physical Exam  Constitutional: He is oriented to person, place, and time. He appears well-developed and well-nourished.  HENT:  Head: Normocephalic and atraumatic.  Right Ear: External ear normal.  Left Ear: External ear normal.  Eyes: Pupils are equal, round, and reactive to light. Conjunctivae and EOM are normal.  Neck: Normal range of motion and phonation normal. Neck supple.  Cardiovascular: Normal rate, regular rhythm and normal heart sounds.  Pulmonary/Chest: Effort normal and breath sounds normal. He exhibits no bony tenderness.  Abdominal:  Soft. There is no hepatosplenomegaly. There is tenderness (Right upper and lower quadrants, mild). There is no rebound and no guarding. Hernia confirmed negative in the ventral area.  Musculoskeletal: Normal range of motion.  Neurological: He is alert and oriented to person, place, and time. No cranial nerve deficit or sensory deficit. He exhibits normal muscle tone. Coordination normal.  Skin: Skin is warm, dry and intact.  Psychiatric: He has a normal mood and affect. His behavior is normal. Judgment and thought content normal.  Nursing note and  vitals reviewed.    ED Treatments / Results  Labs (all labs ordered are listed, but only abnormal results are displayed) Labs Reviewed  COMPREHENSIVE METABOLIC PANEL - Abnormal; Notable for the following components:      Result Value   Glucose, Bld 123 (*)    Total Protein 8.2 (*)    ALT 10 (*)    All other components within normal limits  CBC - Abnormal; Notable for the following components:   WBC 13.0 (*)    All other components within normal limits  URINALYSIS, ROUTINE W REFLEX MICROSCOPIC - Abnormal; Notable for the following components:   Specific Gravity, Urine >1.046 (*)    All other components within normal limits  LIPASE, BLOOD    EKG None  Radiology Ct Abdomen Pelvis W Contrast  Result Date: 05/15/2017 CLINICAL DATA:  Mid to right abdominal pain and nausea for 1 day. EXAM: CT ABDOMEN AND PELVIS WITH CONTRAST TECHNIQUE: Multidetector CT imaging of the abdomen and pelvis was performed using the standard protocol following bolus administration of intravenous contrast. CONTRAST:  13mL ISOVUE-300 IOPAMIDOL (ISOVUE-300) INJECTION 61% COMPARISON:  09/13/2013 CT abdomen/pelvis. FINDINGS: Lower chest: No significant pulmonary nodules or acute consolidative airspace disease. Hepatobiliary: Normal liver size. No liver mass. Normal gallbladder with no radiopaque cholelithiasis. No biliary ductal dilatation. Pancreas: There is a hypodense 0.8 x 0.5 x 1.0 cm anterior pancreatic head lesion (series 3/image 30). No additional pancreatic lesions. No pancreatic duct dilation. No peripancreatic fat stranding or fluid collections. Spleen: Normal size. No mass. Adrenals/Urinary Tract: Normal adrenals. Hypodense subcentimeter upper right renal cortical lesion, too small to characterize, which requires no follow-up. Otherwise normal kidneys, with no hydronephrosis. Normal bladder. Stomach/Bowel: Normal non-distended stomach. Normal caliber small bowel with no small bowel wall thickening. Normal  appendix. Relatively collapsed large bowel, with no convincing large bowel wall thickening or significant pericolonic fat stranding. No colonic diverticulosis. Vascular/Lymphatic: Atherosclerotic nonaneurysmal abdominal aorta. Patent portal, splenic and renal veins. No pathologically enlarged lymph nodes in the abdomen or pelvis. Reproductive: Top-normal size prostate. Other: No pneumoperitoneum, ascites or focal fluid collection. Musculoskeletal: No aggressive appearing focal osseous lesions. IMPRESSION: 1. No evidence of bowel obstruction or acute bowel inflammation. Normal appendix. 2. Low-attenuation anterior pancreatic head 1.0 cm lesion, indeterminate, statistically most likely a pseudocyst or other nonaggressive lesion. No pancreatic or biliary ductal dilatation. Recommend short-term outpatient characterization with MRI abdomen without and with IV contrast. 3.  Aortic Atherosclerosis (ICD10-I70.0). Electronically Signed   By: Ilona Sorrel M.D.   On: 05/15/2017 12:10    Procedures Procedures (including critical care time)  Medications Ordered in ED Medications  fentaNYL (SUBLIMAZE) injection 100 mcg (100 mcg Intravenous Given 05/15/17 1043)  iopamidol (ISOVUE-300) 61 % injection (has no administration in time range)  ondansetron (ZOFRAN) injection 4 mg (4 mg Intravenous Given 05/15/17 1043)  iopamidol (ISOVUE-300) 61 % injection 100 mL (100 mLs Intravenous Contrast Given 05/15/17 1137)     Initial Impression / Assessment and Plan /  ED Course  I have reviewed the triage vital signs and the nursing notes.  Pertinent labs & imaging results that were available during my care of the patient were reviewed by me and considered in my medical decision making (see chart for details).      Patient Vitals for the past 24 hrs:  BP Temp Temp src Pulse Resp SpO2  05/15/17 1005 (!) 186/115 (!) 97.5 F (36.4 C) Oral 85 18 99 %    12:54 PM Reevaluation with update and discussion. After initial assessment  and treatment, an updated evaluation reveals he is comfortable at this time.  Findings discussed with patient and family member, all questions answered.  He requested refills on his blood pressure medications.Garden decision making-nonspecific abdominal pain with reassuring evaluation.  Incidental note made of possible pancreatic cyst on CT imaging.  This does not appear to be a cause of his pain at this time.  Doubt pancreatitis, colitis, urinary tract infection, metabolic instability.  Nursing Notes Reviewed/ Care Coordinated Applicable Imaging Reviewed Interpretation of Laboratory Data incorporated into ED treatment  The patient appears reasonably screened and/or stabilized for discharge and I doubt any other medical condition or other [facility] requiring further screening, evaluation, or treatment in the ED at this time prior to discharge.  Plan: Home Medications-OTC analgesia as needed; Home Treatments-gradually advance diet; return here if the recommended treatment, does not improve the symptoms; Recommended follow up-PCP follow-up 1-2 weeks and as needed.    Final Clinical Impressions(s) / ED Diagnoses   Final diagnoses:  Generalized abdominal pain    ED Discharge Orders        Ordered    lisinopril (PRINIVIL,ZESTRIL) 20 MG tablet  Daily     05/15/17 1248    hydrochlorothiazide (HYDRODIURIL) 25 MG tablet  Daily     05/15/17 1248    HYDROcodone-acetaminophen (NORCO) 5-325 MG tablet  Every 4 hours PRN     05/15/17 1249       Daleen Bo, MD 05/15/17 1255

## 2017-05-15 NOTE — ED Notes (Signed)
Patient transported to CT 

## 2017-05-15 NOTE — Discharge Instructions (Addendum)
The testing today did not show any serious problems.  There is a probable cyst on your pancreas that will require further evaluation, with an MRI, sometime in the next few weeks.  Start with a clear liquid diet then gradually advance to regular foods after a day or 2.

## 2017-05-15 NOTE — ED Triage Notes (Signed)
Pt complains of mid-right abdominal pain and nausea since yesterday, after waking up from a nap. Pt had similar symptoms last year. Pt denies emesis, diarrhea.

## 2017-05-27 ENCOUNTER — Emergency Department (HOSPITAL_COMMUNITY): Payer: Self-pay

## 2017-05-27 ENCOUNTER — Emergency Department (HOSPITAL_COMMUNITY)
Admission: EM | Admit: 2017-05-27 | Discharge: 2017-05-27 | Disposition: A | Discharge status: Home / Self Care | Payer: Self-pay | Attending: Emergency Medicine | Admitting: Emergency Medicine

## 2017-05-27 ENCOUNTER — Other Ambulatory Visit: Payer: Self-pay

## 2017-05-27 ENCOUNTER — Encounter (HOSPITAL_COMMUNITY): Payer: Self-pay

## 2017-05-27 DIAGNOSIS — F17210 Nicotine dependence, cigarettes, uncomplicated: Secondary | ICD-10-CM | POA: Insufficient documentation

## 2017-05-27 DIAGNOSIS — N50811 Right testicular pain: Secondary | ICD-10-CM

## 2017-05-27 DIAGNOSIS — N509 Disorder of male genital organs, unspecified: Secondary | ICD-10-CM | POA: Insufficient documentation

## 2017-05-27 DIAGNOSIS — N5089 Other specified disorders of the male genital organs: Secondary | ICD-10-CM

## 2017-05-27 LAB — BASIC METABOLIC PANEL
ANION GAP: 11 (ref 5–15)
BUN: 9 mg/dL (ref 6–20)
CHLORIDE: 105 mmol/L (ref 101–111)
CO2: 23 mmol/L (ref 22–32)
Calcium: 9.4 mg/dL (ref 8.9–10.3)
Creatinine, Ser: 0.9 mg/dL (ref 0.61–1.24)
GFR calc Af Amer: >60 mL/min (ref >60)
GFR calc non Af Amer: >60 mL/min (ref >60)
GLUCOSE: 120 mg/dL — AB (ref 65–99)
Potassium: 3.5 mmol/L (ref 3.5–5.1)
Sodium: 139 mmol/L (ref 135–145)

## 2017-05-27 LAB — CBC WITH DIFFERENTIAL/PLATELET
BASOS ABS: 0 10*3/uL (ref 0.0–0.1)
Basophils Relative: 0 %
Eosinophils Absolute: 0.1 10*3/uL (ref 0.0–0.7)
Eosinophils Relative: 0 %
HEMATOCRIT: 40.5 % (ref 39.0–52.0)
Hemoglobin: 14 g/dL (ref 13.0–17.0)
LYMPHS PCT: 13 %
Lymphs Abs: 2 10*3/uL (ref 0.7–4.0)
MCH: 33.1 pg (ref 26.0–34.0)
MCHC: 34.6 g/dL (ref 30.0–36.0)
MCV: 95.7 fL (ref 78.0–100.0)
MONO ABS: 1.3 10*3/uL — AB (ref 0.1–1.0)
MONOS PCT: 8 %
Neutro Abs: 12.4 10*3/uL — ABNORMAL HIGH (ref 1.7–7.7)
Neutrophils Relative %: 79 %
Platelets: 328 10*3/uL (ref 150–400)
RBC: 4.23 MIL/uL (ref 4.22–5.81)
RDW: 13.3 % (ref 11.5–15.5)
WBC: 15.7 10*3/uL — ABNORMAL HIGH (ref 4.0–10.5)

## 2017-05-27 LAB — URINALYSIS, ROUTINE W REFLEX MICROSCOPIC
Bilirubin Urine: NEGATIVE
GLUCOSE, UA: NEGATIVE mg/dL
Hgb urine dipstick: NEGATIVE
KETONES UR: NEGATIVE mg/dL
LEUKOCYTES UA: NEGATIVE
Nitrite: NEGATIVE
PH: 6 (ref 5.0–8.0)
Protein, ur: NEGATIVE mg/dL
SPECIFIC GRAVITY, URINE: 1.012 (ref 1.005–1.030)

## 2017-05-27 MED ORDER — SODIUM CHLORIDE 0.9 % IV BOLUS
1000.0000 mL | Freq: Once | INTRAVENOUS | Status: AC
  Administered 2017-05-27: 1000 mL via INTRAVENOUS

## 2017-05-27 MED ORDER — HYDROCODONE-ACETAMINOPHEN 5-325 MG PO TABS
1.0000 | ORAL_TABLET | Freq: Once | ORAL | Status: AC
  Administered 2017-05-27: 1 via ORAL
  Filled 2017-05-27: qty 1

## 2017-05-27 MED ORDER — HYDROMORPHONE HCL 1 MG/ML IJ SOLN
1.0000 mg | Freq: Once | INTRAMUSCULAR | Status: AC
  Administered 2017-05-27: 1 mg via INTRAVENOUS
  Filled 2017-05-27: qty 1

## 2017-05-27 MED ORDER — HYDROMORPHONE HCL 1 MG/ML IJ SOLN
1.0000 mg | Freq: Once | INTRAMUSCULAR | Status: DC
  Filled 2017-05-27: qty 1

## 2017-05-27 MED ORDER — OXYCODONE-ACETAMINOPHEN 5-325 MG PO TABS: 1.0000 | ORAL_TABLET | Freq: Three times a day (TID) | ORAL | 0 refills | Status: AC | PRN

## 2017-05-27 MED ORDER — ACETAMINOPHEN 500 MG PO TABS: 1000.0000 mg | ORAL_TABLET | Freq: Three times a day (TID) | ORAL | 0 refills | Status: AC

## 2017-05-27 NOTE — ED Triage Notes (Signed)
Patient c/o scrotal swelling and pain since yesterday. Today, the pain radiates into the right groin area.

## 2017-05-27 NOTE — ED Notes (Signed)
Bed: QZ30 Expected date:  Expected time:  Means of arrival:  Comments: EMS-OD

## 2017-05-27 NOTE — ED Notes (Addendum)
Ultrasound at the bedside

## 2017-05-27 NOTE — ED Provider Notes (Signed)
[facility] DEPT Provider Note  CSN: [id number] Arrival date & time: 05/27/17 7829  Chief Complaint(s) Testicle Pain and swelling of testicle  HPI Caleb Hubbard is a 37 y.o. male who presents to the emergency department with 2 days of right testicular swelling and 5 hours of constant right testicular pain has gradually been worsening since onset.  Pain is aching and throbbing in nature.  Exacerbated with movement and palpation of the right testicle.  Alleviated by lying still.  Has not tried taking any medication.  Denies any trauma.  Denies any recent sexual encounters.  Denies any urinary symptoms.  Denies any nausea or vomiting.  Denies any abdominal pain.  Denies any other physical complaints.  HPI  Past Medical History Past Medical History:  Diagnosis Date  . Hypertension    There are no active problems to display for this patient.  Home Medication(s) Prior to Admission medications   Medication Sig Start Date End Date Taking? Authorizing Provider  acetaminophen (TYLENOL) 500 MG tablet Take 2 tablets (1,000 mg total) by mouth every 8 (eight) hours for 5 days. Do not take more than 4000 mg of acetaminophen (Tylenol) in a 24-hour period. Please note that other medicines that you may be prescribed may have Tylenol as well. 05/27/17 06/01/17  Fatima Blank, MD  amoxicillin-clavulanate (AUGMENTIN) 875-125 MG tablet Take 1 tablet by mouth 2 (two) times daily. Patient not taking: Reported on 05/15/2017 04/15/16   Larene Pickett, PA-C  hydrochlorothiazide (HYDRODIURIL) 25 MG tablet Take 1 tablet (25 mg total) by mouth daily. 05/15/17   Daleen Bo, MD  HYDROcodone-acetaminophen (NORCO) 5-325 MG tablet Take 1 tablet by mouth every 4 (four) hours as needed for moderate pain. 05/15/17   Daleen Bo, MD  hydrocortisone (ANUSOL-HC) 2.5 % rectal cream Apply rectally 2 times daily 05/19/14   Drenda Freeze, MD  lisinopril (PRINIVIL,ZESTRIL) 20 MG tablet Take 1  tablet (20 mg total) by mouth daily. 05/15/17   Daleen Bo, MD  oxyCODONE-acetaminophen (PERCOCET) 5-325 MG tablet Take 1 tablet by mouth every 8 (eight) hours as needed for up to 5 days for severe pain. Please do not exceed 4000 mg of acetaminophen (Tylenol) a 24-hour period. Please note that he may be prescribed additional medicine that contains acetaminophen. 05/27/17 06/01/17  Fatima Blank, MD  polyethylene glycol [facility] / Floria Raveling) packet Take 17 g by mouth daily. 05/19/14   Drenda Freeze, MD                                                                                                                                    Past Surgical History History reviewed. No pertinent surgical history. Family History No family history on file.  Social History Social History   Tobacco Use  . Smoking status: Current Every Day Smoker    Types: Cigars, Cigarettes  . Smokeless tobacco: Never Used  Substance Use Topics  .  Alcohol use: No  . Drug use: Yes    Types: Marijuana   Allergies Patient has no known allergies.  Review of Systems Review of Systems All other systems are reviewed and are negative for acute change except as noted in the HPI  Physical Exam Vital Signs  I have reviewed the triage vital signs BP (!) 185/119 (BP Location: Left Arm) Comment: pt has not had his BP meds this am  Pulse (!) 119   Temp 99 F (37.2 C) (Oral)   Resp 20   Ht 5\' 10"  (1.778 m)   Wt 68 kg (150 lb)   SpO2 100%   BMI 21.52 kg/m   Physical Exam  Constitutional: He is oriented to person, place, and time. He appears well-developed and well-nourished. No distress.  HENT:  Head: Normocephalic and atraumatic.  Right Ear: External ear normal.  Left Ear: External ear normal.  Nose: Nose normal.  Mouth/Throat: Mucous membranes are normal. No trismus in the jaw.  Eyes: Conjunctivae and EOM are normal. No scleral icterus.  Neck: Normal range of motion and phonation normal.    Cardiovascular: Normal rate and regular rhythm.  Pulmonary/Chest: Effort normal. No stridor. No respiratory distress.  Abdominal: He exhibits no distension. Hernia confirmed negative in the right inguinal area and confirmed negative in the left inguinal area.  Genitourinary: Penis normal. Right testis shows swelling and tenderness. Cremasteric reflex is absent on the right side. Cremasteric reflex is not absent on the left side. Circumcised.  Musculoskeletal: Normal range of motion. He exhibits no edema.  Neurological: He is alert and oriented to person, place, and time.  Skin: He is not diaphoretic.  Psychiatric: He has a normal mood and affect. His behavior is normal.  Vitals reviewed.   ED Results and Treatments Labs (all labs ordered are listed, but only abnormal results are displayed) Labs Reviewed  CBC WITH DIFFERENTIAL/PLATELET - Abnormal; Notable for the following components:      Result Value   WBC 15.7 (*)    Neutro Abs 12.4 (*)    Monocytes Absolute 1.3 (*)    All other components within normal limits  BASIC METABOLIC PANEL - Abnormal; Notable for the following components:   Glucose, Bld 120 (*)    All other components within normal limits  URINALYSIS, ROUTINE W REFLEX MICROSCOPIC                                                                                                                         EKG  EKG Interpretation  Date/Time:    Ventricular Rate:    PR Interval:    QRS Duration:   QT Interval:    QTC Calculation:   R Axis:     Text Interpretation:        Radiology SCROTAL ULTRASOUND  DOPPLER ULTRASOUND OF THE TESTICLES  TECHNIQUE: Complete ultrasound examination of the testicles, epididymis, and other scrotal structures was performed. Color and spectral Doppler ultrasound were also utilized to evaluate blood  flow to the testicles.  COMPARISON:  None.  FINDINGS: Right testicle  Measurements: 4.5 x 4.0 x 3.6 cm. Heterogeneous mass noted  within the right testicle measures 4.0 x 3.9 x 3.7 cm. This is mixed solid and cystic mass. Diffuse microlithiasis.  Left testicle  Measurements:  4.1 x 2.6 x 2.2 cm.  Microlithiasis.  No mass.  Right epididymis:  Normal in size and appearance.  Left epididymis:  Normal in size and appearance.  Hydrocele:  Moderate right hydrocele.  Varicocele:  None visualized.  Pulsed Doppler interrogation of both testes demonstrates normal low resistance arterial and venous waveforms bilaterally.  IMPRESSION: Microlithiasis.  Heterogeneous mix solid and cystic mass within the right testicle measuring 4.0 x 3.9 x 3.7 cm concerning for malignancy. Recommend urologic consultation.  Pertinent labs & imaging results that were available during my care of the patient were reviewed by me and considered in my medical decision making (see chart for details).  Medications Ordered in ED Medications  HYDROmorphone (DILAUDID) injection 1 mg (1 mg Intravenous Given 05/27/17 1031)  sodium chloride 0.9 % bolus 1,000 mL (0 mLs Intravenous Stopped 05/27/17 1137)  HYDROcodone-acetaminophen (NORCO/VICODIN) 5-325 MG per tablet 1 tablet (1 tablet Oral Given 05/27/17 1210)                                                                                                                                    Procedures Procedures  (including critical care time)  Medical Decision Making / ED Course I have reviewed the nursing notes for this encounter and the patient's prior records (if available in EHR or on provided paperwork).    Initial concern was for torsion versus epididymitis.  Patient provided with IV pain medicine.  Screening labs and urine obtained.  Ultrasound also obtain to assess for blood flow. Labs grossly reassuring.  UA without evidence of infection.  Ultrasound not consistent with torsion but was suspicious for likely testicular cancer.  Case discussed with Dr. Gloriann Loan who recommended close  outpatient follow-up.  The patient appears reasonably screened and/or stabilized for discharge and I doubt any other medical condition or other [facility] requiring further screening, evaluation, or treatment in the ED at this time prior to discharge.  The patient is safe for discharge with strict return precautions.   Final Clinical Impression(s) / ED Diagnoses Final diagnoses:  Right testicular pain  Testicular mass    Disposition: Discharge  Condition: Good  I have discussed the results, Dx and Tx plan with the patient who expressed understanding and agree(s) with the plan. Discharge instructions discussed at great length. The patient was given strict return precautions who verbalized understanding of the instructions. No further questions at time of discharge.    ED Discharge Orders        Ordered    oxyCODONE-acetaminophen (PERCOCET) 5-325 MG tablet  Every 8 hours PRN     05/27/17 1239    acetaminophen (TYLENOL) 500  MG tablet  Every 8 hours     05/27/17 1239       Follow Up: Lucas Mallow, MD [place] [address] [phone]  Go to  after discharge to set up follow up appointment     This chart was dictated using voice recognition software.  Despite best efforts to proofread,  errors can occur which can change the documentation meaning.   Fatima Blank, MD 05/28/17 [phone]

## 2017-06-12 ENCOUNTER — Other Ambulatory Visit: Payer: Self-pay | Admitting: Urology

## 2017-06-13 ENCOUNTER — Other Ambulatory Visit: Payer: Self-pay

## 2017-06-13 ENCOUNTER — Encounter (HOSPITAL_COMMUNITY): Payer: Self-pay | Admitting: *Deleted

## 2017-06-16 NOTE — H&P (Signed)
Urology H+P Note    Chief Complaint:  Right testicular mass  HPI: Caleb Hubbard is a 37 y.o. male with newly diagnosed right testicular mass. Seen in ED recently for right testicular pain - scrotal u/s showed right testicular mass concerning for neoplasm. Seen in clinic on 5/2. Discussed options and patient opted for Right radical orchiectomy.   Of note AFP - 2.7. Bhcg and LDH not obtained.   Scrotal U/S Impression:  Heterogeneous mix solid and cystic mass within the right testicle measuring 4.0 x 3.9 x 3.7 cm concerning for malignancy. Recommend urologic consultation.   Past Medical History: Past Medical History:  Diagnosis Date  . Anxiety   . Headache   . Hypertension   . Testicular mass     Past Surgical History:  Past Surgical History:  Procedure Laterality Date  . NO PAST SURGERIES      Medication: No current facility-administered medications for this encounter.    Current Outpatient Medications  Medication Sig Dispense Refill  . cloNIDine (CATAPRES) 0.2 MG tablet Take 0.2 mg by mouth 2 (two) times daily.    Marland Kitchen lisinopril (PRINIVIL,ZESTRIL) 20 MG tablet Take 1 tablet (20 mg total) by mouth daily. 30 tablet 1  . oxyCODONE (OXY IR/ROXICODONE) 5 MG immediate release tablet Take 5 mg by mouth 3 (three) times daily as needed for severe pain.    . hydrochlorothiazide (HYDRODIURIL) 25 MG tablet Take 1 tablet (25 mg total) by mouth daily. (Patient not taking: Reported on 06/13/2017) 30 tablet 1  . HYDROcodone-acetaminophen (NORCO) 5-325 MG tablet Take 1 tablet by mouth every 4 (four) hours as needed for moderate pain. (Patient not taking: Reported on 06/13/2017) 15 tablet 0  . hydrocortisone (ANUSOL-HC) 2.5 % rectal cream Apply rectally 2 times daily (Patient not taking: Reported on 06/13/2017) 28.35 g 0  . polyethylene glycol (MIRALAX / GLYCOLAX) packet Take 17 g by mouth daily. (Patient not taking: Reported on 06/13/2017) 14 each 0    Allergies: No Known Allergies  Social  History: Social History   Tobacco Use  . Smoking status: Current Every Day Smoker    Types: Cigars  . Smokeless tobacco: Never Used  Substance Use Topics  . Alcohol use: No  . Drug use: Yes    Types: Marijuana    Comment: LAST USE MARIJUANA 06-07-17    Family History History reviewed. No pertinent family history.  Review of Systems 10 systems were reviewed and are negative except as noted specifically in the HPI.  Objective   Vital signs in last 24 hours: There were no vitals taken for this visit.  Physical Exam General: NAD, A&O, resting, appropriate HEENT: [place]/AT, EOMI, MMM Pulmonary: Normal work of breathing Cardiovascular: HDS, adequate peripheral perfusion Abdomen: Soft, NTTP, nondistended, . GU: Firm, right testicle.  Extremities: warm and well perfused Neuro: Appropriate, no focal neurological deficits  Most Recent Labs: Lab Results  Component Value Date   WBC 15.7 (H) 05/27/2017   HGB 14.0 05/27/2017   HCT 40.5 05/27/2017   PLT 328 05/27/2017    Lab Results  Component Value Date   NA 139 05/27/2017   K 3.5 05/27/2017   CL 105 05/27/2017   CO2 23 05/27/2017   BUN 9 05/27/2017   CREATININE 0.90 05/27/2017   CALCIUM 9.4 05/27/2017    No results found for: INR, APTT   IMAGING: No results found.  ------  Assessment:  37 y.o. male with right testicular mass. Here today for Right radical orchiectomy    Plan: -  proceed with right radical orchiectomy  - Obtain B-hcg and LDH in preop

## 2017-06-17 ENCOUNTER — Other Ambulatory Visit: Payer: Self-pay

## 2017-06-17 ENCOUNTER — Ambulatory Visit (HOSPITAL_COMMUNITY)
Admission: RE | Admit: 2017-06-17 | Discharge: 2017-06-17 | Disposition: A | Discharge status: Home / Self Care | Payer: Self-pay | Source: Ambulatory Visit | Attending: Urology | Admitting: Urology

## 2017-06-17 ENCOUNTER — Ambulatory Visit (HOSPITAL_COMMUNITY): Payer: Self-pay | Admitting: Certified Registered Nurse Anesthetist

## 2017-06-17 ENCOUNTER — Telehealth (HOSPITAL_COMMUNITY): Payer: Self-pay | Admitting: *Deleted

## 2017-06-17 ENCOUNTER — Encounter (HOSPITAL_COMMUNITY)
Admission: RE | Disposition: A | Discharge status: Home / Self Care | Payer: Self-pay | Source: Ambulatory Visit | Attending: Urology

## 2017-06-17 ENCOUNTER — Encounter (HOSPITAL_COMMUNITY): Payer: Self-pay | Admitting: *Deleted

## 2017-06-17 DIAGNOSIS — Z79899 Other long term (current) drug therapy: Secondary | ICD-10-CM | POA: Insufficient documentation

## 2017-06-17 DIAGNOSIS — R51 Headache: Secondary | ICD-10-CM | POA: Insufficient documentation

## 2017-06-17 DIAGNOSIS — F17290 Nicotine dependence, other tobacco product, uncomplicated: Secondary | ICD-10-CM | POA: Insufficient documentation

## 2017-06-17 DIAGNOSIS — F419 Anxiety disorder, unspecified: Secondary | ICD-10-CM | POA: Insufficient documentation

## 2017-06-17 DIAGNOSIS — I10 Essential (primary) hypertension: Secondary | ICD-10-CM | POA: Insufficient documentation

## 2017-06-17 DIAGNOSIS — C6211 Malignant neoplasm of descended right testis: Secondary | ICD-10-CM | POA: Insufficient documentation

## 2017-06-17 HISTORY — DX: Anxiety disorder, unspecified: F41.9

## 2017-06-17 HISTORY — PX: ORCHIECTOMY: SHX2116

## 2017-06-17 HISTORY — DX: Headache, unspecified: R51.9

## 2017-06-17 HISTORY — DX: Headache: R51

## 2017-06-17 HISTORY — DX: Other specified disorders of the male genital organs: N50.89

## 2017-06-17 LAB — LACTATE DEHYDROGENASE: LDH: 140 U/L (ref 98–192)

## 2017-06-17 SURGERY — ORCHIECTOMY
Anesthesia: General | Site: Perineum | Laterality: Right

## 2017-06-17 MED ORDER — FENTANYL CITRATE (PF) 100 MCG/2ML IJ SOLN
25.0000 ug | INTRAMUSCULAR | Status: DC | PRN
  Administered 2017-06-17 (×3): 50 ug via INTRAVENOUS

## 2017-06-17 MED ORDER — DEXAMETHASONE SODIUM PHOSPHATE 10 MG/ML IJ SOLN
INTRAMUSCULAR | Status: DC | PRN
  Administered 2017-06-17: 10 mg via INTRAVENOUS

## 2017-06-17 MED ORDER — ONDANSETRON HCL 4 MG/2ML IJ SOLN
INTRAMUSCULAR | Status: DC | PRN
  Administered 2017-06-17: 4 mg via INTRAVENOUS

## 2017-06-17 MED ORDER — PROMETHAZINE HCL 25 MG/ML IJ SOLN: 6.2500 mg | INTRAMUSCULAR | Status: DC | PRN

## 2017-06-17 MED ORDER — HYDRALAZINE HCL 20 MG/ML IJ SOLN
INTRAMUSCULAR | Status: AC
  Filled 2017-06-17: qty 1

## 2017-06-17 MED ORDER — OXYCODONE HCL 5 MG/5ML PO SOLN
5.0000 mg | Freq: Once | ORAL | Status: DC | PRN
  Filled 2017-06-17: qty 5

## 2017-06-17 MED ORDER — PROPOFOL 10 MG/ML IV BOLUS
INTRAVENOUS | Status: DC | PRN
  Administered 2017-06-17: 80 mg via INTRAVENOUS
  Administered 2017-06-17: 250 mg via INTRAVENOUS

## 2017-06-17 MED ORDER — FENTANYL CITRATE (PF) 100 MCG/2ML IJ SOLN
INTRAMUSCULAR | Status: AC
  Filled 2017-06-17: qty 2

## 2017-06-17 MED ORDER — OXYCODONE HCL 5 MG PO CAPS: 5.0000 mg | ORAL_CAPSULE | ORAL | 0 refills | Status: DC | PRN

## 2017-06-17 MED ORDER — 0.9 % SODIUM CHLORIDE (POUR BTL) OPTIME
TOPICAL | Status: DC | PRN
  Administered 2017-06-17: 1000 mL

## 2017-06-17 MED ORDER — HYDROMORPHONE HCL 1 MG/ML IJ SOLN: 0.2500 mg | INTRAMUSCULAR | Status: DC | PRN

## 2017-06-17 MED ORDER — MIDAZOLAM HCL 2 MG/2ML IJ SOLN
INTRAMUSCULAR | Status: AC
  Filled 2017-06-17: qty 2

## 2017-06-17 MED ORDER — LIDOCAINE 2% (20 MG/ML) 5 ML SYRINGE
INTRAMUSCULAR | Status: AC
  Filled 2017-06-17: qty 5

## 2017-06-17 MED ORDER — ONDANSETRON HCL 4 MG/2ML IJ SOLN
INTRAMUSCULAR | Status: AC
  Filled 2017-06-17: qty 2

## 2017-06-17 MED ORDER — CEFAZOLIN SODIUM-DEXTROSE 2-4 GM/100ML-% IV SOLN
INTRAVENOUS | Status: AC
  Filled 2017-06-17: qty 100

## 2017-06-17 MED ORDER — BUPIVACAINE HCL (PF) 0.5 % IJ SOLN
INTRAMUSCULAR | Status: AC
  Filled 2017-06-17: qty 30

## 2017-06-17 MED ORDER — FENTANYL CITRATE (PF) 250 MCG/5ML IJ SOLN
INTRAMUSCULAR | Status: AC
  Filled 2017-06-17: qty 5

## 2017-06-17 MED ORDER — ROCURONIUM BROMIDE 10 MG/ML (PF) SYRINGE
PREFILLED_SYRINGE | INTRAVENOUS | Status: AC
  Filled 2017-06-17: qty 5

## 2017-06-17 MED ORDER — PROPOFOL 10 MG/ML IV BOLUS
INTRAVENOUS | Status: AC
  Filled 2017-06-17: qty 20

## 2017-06-17 MED ORDER — BUPIVACAINE HCL (PF) 0.5 % IJ SOLN
INTRAMUSCULAR | Status: DC | PRN
  Administered 2017-06-17: 20 mL

## 2017-06-17 MED ORDER — LACTATED RINGERS IV SOLN
INTRAVENOUS | Status: DC
Start: 2017-06-17 — End: 2017-06-17
  Administered 2017-06-17 (×2): via INTRAVENOUS

## 2017-06-17 MED ORDER — CEFAZOLIN SODIUM-DEXTROSE 2-4 GM/100ML-% IV SOLN
2.0000 g | Freq: Once | INTRAVENOUS | Status: AC
  Administered 2017-06-17: 2 g via INTRAVENOUS

## 2017-06-17 MED ORDER — PHENYLEPHRINE 40 MCG/ML (10ML) SYRINGE FOR IV PUSH (FOR BLOOD PRESSURE SUPPORT)
PREFILLED_SYRINGE | INTRAVENOUS | Status: DC | PRN
  Administered 2017-06-17: 40 ug via INTRAVENOUS

## 2017-06-17 MED ORDER — DEXAMETHASONE SODIUM PHOSPHATE 10 MG/ML IJ SOLN
INTRAMUSCULAR | Status: AC
  Filled 2017-06-17: qty 1

## 2017-06-17 MED ORDER — OXYCODONE HCL 5 MG PO TABS: 5.0000 mg | ORAL_TABLET | Freq: Once | ORAL | Status: DC | PRN

## 2017-06-17 MED ORDER — HYDRALAZINE HCL 20 MG/ML IJ SOLN
10.0000 mg | INTRAMUSCULAR | Status: DC | PRN
  Administered 2017-06-17 (×2): 10 mg via INTRAVENOUS

## 2017-06-17 MED ORDER — FENTANYL CITRATE (PF) 100 MCG/2ML IJ SOLN
INTRAMUSCULAR | Status: DC | PRN
  Administered 2017-06-17 (×5): 50 ug via INTRAVENOUS

## 2017-06-17 MED ORDER — MIDAZOLAM HCL 5 MG/5ML IJ SOLN
INTRAMUSCULAR | Status: DC | PRN
  Administered 2017-06-17: 2 mg via INTRAVENOUS

## 2017-06-17 MED ORDER — CLONIDINE HCL ER 0.1 MG PO TB12
0.2000 mg | ORAL_TABLET | Freq: Once | ORAL | Status: AC
  Administered 2017-06-17: 0.2 mg via ORAL
  Filled 2017-06-17: qty 2

## 2017-06-17 SURGICAL SUPPLY — 31 items
BLADE HEX COATED 2.75 (ELECTRODE) ×2 IMPLANT
BNDG GAUZE ELAST 4 BULKY (GAUZE/BANDAGES/DRESSINGS) ×2 IMPLANT
BRIEF STRETCH FOR OB PAD LRG (UNDERPADS AND DIAPERS) ×2 IMPLANT
COVER SURGICAL LIGHT HANDLE (MISCELLANEOUS) ×2 IMPLANT
DERMABOND ADVANCED (GAUZE/BANDAGES/DRESSINGS) ×1
DERMABOND ADVANCED .7 DNX12 (GAUZE/BANDAGES/DRESSINGS) ×1 IMPLANT
DRAPE LAPAROTOMY T 98X78 PEDS (DRAPES) ×2 IMPLANT
ELECT NEEDLE TIP 2.8 STRL (NEEDLE) ×2 IMPLANT
ELECT REM PT RETURN 15FT ADLT (MISCELLANEOUS) ×2 IMPLANT
GAUZE SPONGE 4X4 12PLY STRL (GAUZE/BANDAGES/DRESSINGS) ×2 IMPLANT
GLOVE BIO SURGEON STRL SZ7.5 (GLOVE) ×2 IMPLANT
GLOVE BIOGEL M STRL SZ7.5 (GLOVE) ×4 IMPLANT
GLOVE BIOGEL PI IND STRL 6.5 (GLOVE) ×2 IMPLANT
GLOVE BIOGEL PI IND STRL 7.5 (GLOVE) ×2 IMPLANT
GLOVE BIOGEL PI INDICATOR 6.5 (GLOVE) ×2
GLOVE BIOGEL PI INDICATOR 7.5 (GLOVE) ×2
GOWN STRL REUS W/TWL LRG LVL3 (GOWN DISPOSABLE) ×4 IMPLANT
GOWN STRL REUS W/TWL XL LVL3 (GOWN DISPOSABLE) ×2 IMPLANT
KIT BASIN OR (CUSTOM PROCEDURE TRAY) ×2 IMPLANT
NEEDLE HYPO 22GX1.5 SAFETY (NEEDLE) IMPLANT
PACK GENERAL/GYN (CUSTOM PROCEDURE TRAY) ×2 IMPLANT
SUPPORT SCROTAL LG STRP (MISCELLANEOUS) ×2 IMPLANT
SUT CHROMIC 3 0 SH 27 (SUTURE) ×4 IMPLANT
SUT MNCRL AB 4-0 PS2 18 (SUTURE) ×2 IMPLANT
SUT PROLENE 3 0 PS 1 (SUTURE) ×2 IMPLANT
SUT SILK 0 SH 30 (SUTURE) ×2 IMPLANT
SUT SILK 2 0 SH CR/8 (SUTURE) ×2 IMPLANT
SUT VIC AB 2-0 SH 27 (SUTURE) ×2
SUT VIC AB 2-0 SH 27X BRD (SUTURE) ×2 IMPLANT
SYR CONTROL 10ML LL (SYRINGE) IMPLANT
TOWEL OR 17X26 10 PK STRL BLUE (TOWEL DISPOSABLE) ×4 IMPLANT

## 2017-06-17 NOTE — Anesthesia Preprocedure Evaluation (Addendum)
Anesthesia Evaluation  Patient identified by MRN, date of birth, ID band Patient awake    Reviewed: Allergy & Precautions, NPO status , Patient's Chart, lab work & pertinent test results  Airway Mallampati: II  TM Distance: >3 FB Neck ROM: Full    Dental  (+) Dental Advisory Given   Pulmonary Current Smoker,    breath sounds clear to auscultation       Cardiovascular hypertension, Pt. on medications  Rhythm:Regular Rate:Normal     Neuro/Psych  Headaches, Anxiety    GI/Hepatic negative GI ROS, Neg liver ROS,   Endo/Other  negative endocrine ROS  Renal/GU negative Renal ROS   Testicular mass    Musculoskeletal negative musculoskeletal ROS (+)   Abdominal   Peds  Hematology negative hematology ROS (+)   Anesthesia Other Findings   Reproductive/Obstetrics                            Anesthesia Physical Anesthesia Plan  ASA: II  Anesthesia Plan: General   Post-op Pain Management:    Induction: Intravenous  PONV Risk Score and Plan: 3 and Treatment may vary due to age or medical condition, Ondansetron, Dexamethasone and Midazolam  Airway Management Planned: LMA  Additional Equipment: None  Intra-op Plan:   Post-operative Plan: Extubation in OR  Informed Consent: I have reviewed the patients History and Physical, chart, labs and discussed the procedure including the risks, benefits and alternatives for the proposed anesthesia with the patient or authorized representative who has indicated his/her understanding and acceptance.   Dental advisory given  Plan Discussed with: CRNA and Anesthesiologist  Anesthesia Plan Comments:         Anesthesia Quick Evaluation

## 2017-06-17 NOTE — Interval H&P Note (Cosign Needed)
History and Physical Interval Note:  06/17/2017 12:14 PM  Caleb Hubbard  has presented today for surgery, with the diagnosis of RIGHT TESTICULAR MASS  The various methods of treatment have been discussed with the patient and family. After consideration of risks, benefits and other options for treatment, the patient has consented to  Procedure(s): RADICAL ORCHIECTOMY (Right) as a surgical intervention .  The patient's history has been reviewed, patient examined, no change in status, stable for surgery.  I have reviewed the patient's chart and labs.  Questions were answered to the patient's satisfaction.     Alla Feeling, MD

## 2017-06-17 NOTE — Transfer of Care (Signed)
Immediate Anesthesia Transfer of Care Note  Patient: Caleb Hubbard  Procedure(s) Performed: RIGHT RADICAL ORCHIECTOMY (Right Perineum)  Patient Location: PACU  Anesthesia Type:General  Level of Consciousness: awake, alert , oriented and patient cooperative  Airway & Oxygen Therapy: Patient Spontanous Breathing and Patient connected to face mask oxygen  Post-op Assessment: Report given to RN, Post -op Vital signs reviewed and stable and Patient moving all extremities  Post vital signs: Reviewed and stable  Last Vitals:  Vitals Value Taken Time  BP    Temp    Pulse 62 06/17/2017  1:35 PM  Resp 11 06/17/2017  1:35 PM  SpO2 100 % 06/17/2017  1:35 PM  Vitals shown include unvalidated device data.  Last Pain:  Vitals:   06/17/17 0942  TempSrc: Oral         Complications: No apparent anesthesia complications

## 2017-06-17 NOTE — Anesthesia Postprocedure Evaluation (Signed)
Anesthesia Post Note  Patient: Emett Mick  Procedure(s) Performed: RIGHT RADICAL ORCHIECTOMY (Right Perineum)     Patient location during evaluation: PACU Anesthesia Type: General Level of consciousness: awake and alert Pain management: pain level controlled Vital Signs Assessment: post-procedure vital signs reviewed and stable Respiratory status: spontaneous breathing, nonlabored ventilation, respiratory function stable and patient connected to nasal cannula oxygen Cardiovascular status: blood pressure returned to baseline and stable Postop Assessment: no apparent nausea or vomiting Anesthetic complications: no Comments: Patient instructed to follow up with primary care regarding hypertension. Patient states baseline BP, even on current medications, runs 180's/100's consistently.    Last Vitals:  Vitals:   06/17/17 1439 06/17/17 1458  BP: (!) 168/98 (!) 176/89  Pulse: 77 76  Resp: 16 14  Temp:  37.1 C  SpO2: 98% 100%    Last Pain:  Vitals:   06/17/17 1439  TempSrc:   PainSc: [place]

## 2017-06-17 NOTE — Op Note (Signed)
Preoperative Diagnosis: Right testicular mass   Postoperative Diagnosis: Same  Procedure(s): - RIGHT RADICAL ORCHIECTOMY  Surgeon:  Surgeon(s): Ceasar Mons, MD  Resident Surgeon: Alla Feeling, MD, MD  Anesthesia:  Choice  Fluids:  See anesthesia record  Estimated blood loss:  Minimal   Specimens:  Right testicular and spermatic chord   Drains:  None  Complications:  None  Indications:  This is a 37 y.o. patient with a history of right testicular mass. After discussion of the risks & benefits and alternatives to surgical approach, the patient wishes to proceed with Right radical orchiectomy. We discussed fertility presentation prior to surgery - the patient is not interested.  Risks & benefits of the procedure discussed with the patient, who wishes to proceed.   Findings:   - Indurated and firm right testicle  - Uncomplicated right radical orchiectomy with high ligation - Ligated cord marked with long silk tail  - Excellent hemostasis  Procedure:  The patient was properly identified and marked in the holding room. He received preoperative IV antibiotics. He was then taken to the operating room where general anesthetic was administered. The scrotum was then prepped and draped in the usual manner. Appropriate surgical timeout was performed.  A 4 cm incision was made 1 finger breadth superior to the pubic tubercle. The overlying soft tissue was dissected with electrocautery. The fascia was then sharply incised, the opening was extended to the external ring. Next the ilioinguinal nerve was identified, dissected free and gently retracted medially. We then dissected around the cord and placed a penrose tourniquet around it. The cord was dissected proximally and distally. Next, the right testicle was delivered into the wound. The gubernaculum was identified and ligated with electrocautery. The tunica vaginalis was intact and there was no damage to the scrotal skin. Next,  the cord was dissected proximally until we encountered retroperitoneal fat. A kelly clamp was placed on the cord and it was suture ligated with 2-0 silk. An additional free tie 2-0 silk was placed proximal to this. Both suture were left long. The cord was then ligated and hemostasis was excellent. Local anesthetic was applied. The wound, including the right hemiscrotum, had excellent hemostasis. The overlying fascia was then re-approximated with 3-0 vicryl. The soft tissue approximated with 3-0 vicryl and the skin approximated with 4-0 Monocryl in a subcuticular fashion. The patient was awoken from anesthesia and recovered in the pacu with out difficulty.   Plan: 1. OK to discharge when appropriate 2. Follow up in 6 weeks w/ repeat tumor markers

## 2017-06-17 NOTE — Anesthesia Procedure Notes (Addendum)
Procedure Name: LMA Insertion Date/Time: 06/17/2017 12:23 PM Performed by: Mitzie Na, CRNA Pre-anesthesia Checklist: Patient identified, Emergency Drugs available, Suction available, Patient being monitored and Timeout performed Patient Re-evaluated:Patient Re-evaluated prior to induction Oxygen Delivery Method: Circle system utilized Preoxygenation: Pre-oxygenation with 100% oxygen Induction Type: IV induction Ventilation: Mask ventilation without difficulty LMA: LMA with gastric port inserted LMA Size: 4.0 Number of attempts: 1 Dental Injury: Teeth and Oropharynx as per pre-operative assessment

## 2017-06-17 NOTE — Discharge Instructions (Signed)
Orchiectomy, Care After This sheet gives you information about how to care for yourself after your procedure. Your health care provider may also give you more specific instructions. If you have problems or questions, contact your health care provider. What can I expect after the procedure? After the procedure, it is common to have:  Pain.  Bruising.  Blood pooling (hematoma) in the area where your testicles were removed.  Depression or mood changes.  Fatigue.  Hot flashes.  Follow these instructions at home: Managing pain and swelling  If directed, put ice on the affected area: ? Put ice in a plastic bag. ? Place a towel between your skin and the bag. ? Leave the ice on for 20 minutes, 2-3 times a day.  Wear scrotal support as told by your health care provider.  To relieve pressure and pain when sitting, you may use a donut cushion if directed by your health care provider. Incision care  Follow instructions from your health care provider about how to take care of your incision. Make sure you: ? Wash your hands with soap and water before you change your bandage (dressing). If soap and water are not available, use hand sanitizer. ? Change your dressing once a day, or as often as told by your health care provider. If the dressing sticks to your incision area: ? Use warm, soapy water or hydrogen peroxide to dampen the dressing. ? When the dressing becomes loose, lift it from the incision area. Make sure that the incision stays closed. ? Leave stitches (sutures), skin glue, or adhesive strips in place. These skin closures may need to stay in place for 2 weeks or longer. If adhesive strip edges start to loosen and curl up, you may trim the loose edges. Do not remove adhesive strips completely unless your health care provider tells you to do that.  Keep your dressing dry until it has been removed.  Check your incision area every day for signs of infection. Check for: ? More redness,  swelling, or pain. ? More fluid or blood. ? Warmth. ? Pus or a bad smell. Bathing  Do not take baths, swim, or use a hot tub until your health care provider approves. You may start taking showers two days after your procedure.  Do not rub your incision to dry it. Pat the area gently with a clean cloth or let it air-dry. Medicines  Take over-the-counter and prescription medicines only as told by your health care provider.  If you were prescribed an antibiotic medicine, use it as told by your health care provider. Do not stop using the antibiotic even if you start to feel better.  If you had both testicles removed, talk with your health care provider about medicine supplements to replace one of the male hormones (testosterone) that your body will no longer make. Driving  Do not drive for 24 hours if you were given a medicine to help you relax (sedative).  Do not drive or use heavy machinery while taking prescription pain medicines. Activity  Avoid activities that may cause your incision to open, such as jogging, playing sports, and straining with bowel movements. Ask your health care provider what activities are safe for you.  Do not lift anything that is heavier than 10 lb (4.5 kg), or the limit that your health care provider tells you, until he or she says that it is safe.  Do not engage in sexual activity until the area is healed and your health care provider approves.  This could take up to 4 weeks. General instructions  To prevent or treat constipation while you are taking prescription pain medicine, your health care provider may recommend that you: ? Drink enough fluid to keep your urine clear or pale yellow. ? Take over-the-counter or prescription medicines. ? Eat foods that are high in fiber, such as fresh fruits and vegetables, whole grains, and beans. ? Limit foods that are high in fat and processed sugars, such as fried and sweet foods.  Do not use any products that  contain nicotine or tobacco, such as cigarettes and e-cigarettes. If you need help quitting, ask your health care provider.  Keep all follow-up visits as told by your health care provider. This is important. Contact a health care provider if:  You have more pain, swelling, or redness in your genital or groin area.  You have more fluid or blood coming from your incision.  Your incision feels warm to the touch.  You have pus or a bad smell coming from your incision.  You have constipation that is not helped by changing your diet or drinking more fluid.  You develop nausea or vomiting.  You cannot eat or drink without vomiting. Get help right away if:  You have dizziness or nausea that does not go away.  You have trouble breathing.  You have a wet (congested) cough.  You have a fever or shaking chills.  Your incision breaks open after the skin closures have been removed.  You are not able to urinate. Summary  After this procedure, it is most common to have bruising or blood pooling in the area where the testicles were removed.  You should check your incision area every day for signs of infection, such as redness, swelling, pain, fluid, blood, warmth, pus, or a bad smell.  Avoid activities that may cause your incision to open, such as jogging, playing sports, and straining with bowel movements. Ask your health care provider what activities are safe for you.  You should not engage in sexual activity until the area is healed and your health care provider approves. This could take up to 4 weeks.  Men who have both testicles removed may have emotional and physical side effects. Your health care provider can help you with ways to manage those side effects. This information is not intended to replace advice given to you by your health care provider. Make sure you discuss any questions you have with your health care provider. Document Released: 09/30/2012 Document Revised: 12/21/2015  Document Reviewed: 12/21/2015 Elsevier Interactive Patient Education  2017 [employer].

## 2017-06-18 LAB — AFP TUMOR MARKER: AFP, SERUM, TUMOR MARKER: 2.8 ng/mL (ref 0.0–8.3)

## 2017-10-30 HISTORY — PX: OTHER SURGICAL HISTORY: SHX169

## 2017-12-12 DIAGNOSIS — I639 Cerebral infarction, unspecified: Secondary | ICD-10-CM

## 2017-12-12 HISTORY — DX: Cerebral infarction, unspecified: I63.9

## 2018-01-03 ENCOUNTER — Encounter (HOSPITAL_COMMUNITY): Payer: Self-pay

## 2018-01-03 ENCOUNTER — Emergency Department (HOSPITAL_COMMUNITY): Payer: Medicaid Other

## 2018-01-03 ENCOUNTER — Other Ambulatory Visit: Payer: Self-pay

## 2018-01-03 ENCOUNTER — Emergency Department (HOSPITAL_COMMUNITY): Payer: Medicaid Other | Admitting: Anesthesiology

## 2018-01-03 ENCOUNTER — Encounter (HOSPITAL_COMMUNITY)
Admission: EM | Disposition: A | Discharge status: Rehabilitation Facility | Payer: Self-pay | Source: Home / Self Care | Attending: Internal Medicine

## 2018-01-03 ENCOUNTER — Inpatient Hospital Stay (HOSPITAL_COMMUNITY): Payer: Medicaid Other

## 2018-01-03 ENCOUNTER — Ambulatory Visit (HOSPITAL_COMMUNITY)
Admit: 2018-01-03 | Discharge: 2018-01-03 | Disposition: A | Discharge status: Home / Self Care | Payer: Medicaid Other | Attending: Neurology | Admitting: Neurology

## 2018-01-03 ENCOUNTER — Other Ambulatory Visit: Payer: Self-pay | Admitting: Oncology

## 2018-01-03 ENCOUNTER — Inpatient Hospital Stay (HOSPITAL_COMMUNITY)
Admission: EM | Admit: 2018-01-03 | Discharge: 2018-01-15 | DRG: 023 | Disposition: A | Discharge status: Rehabilitation Facility | Payer: Medicaid Other | Attending: Internal Medicine | Admitting: Internal Medicine

## 2018-01-03 DIAGNOSIS — R4701 Aphasia: Secondary | ICD-10-CM | POA: Diagnosis not present

## 2018-01-03 DIAGNOSIS — J9600 Acute respiratory failure, unspecified whether with hypoxia or hypercapnia: Secondary | ICD-10-CM | POA: Diagnosis present

## 2018-01-03 DIAGNOSIS — T451X5A Adverse effect of antineoplastic and immunosuppressive drugs, initial encounter: Secondary | ICD-10-CM | POA: Diagnosis present

## 2018-01-03 DIAGNOSIS — I700 Atherosclerosis of aorta: Secondary | ICD-10-CM | POA: Diagnosis present

## 2018-01-03 DIAGNOSIS — C6291 Malignant neoplasm of right testis, unspecified whether descended or undescended: Secondary | ICD-10-CM | POA: Diagnosis present

## 2018-01-03 DIAGNOSIS — I639 Cerebral infarction, unspecified: Secondary | ICD-10-CM | POA: Diagnosis present

## 2018-01-03 DIAGNOSIS — R1310 Dysphagia, unspecified: Secondary | ICD-10-CM | POA: Diagnosis not present

## 2018-01-03 DIAGNOSIS — D6959 Other secondary thrombocytopenia: Secondary | ICD-10-CM | POA: Diagnosis present

## 2018-01-03 DIAGNOSIS — E785 Hyperlipidemia, unspecified: Secondary | ICD-10-CM | POA: Diagnosis present

## 2018-01-03 DIAGNOSIS — E781 Pure hyperglyceridemia: Secondary | ICD-10-CM | POA: Diagnosis present

## 2018-01-03 DIAGNOSIS — N189 Chronic kidney disease, unspecified: Secondary | ICD-10-CM | POA: Diagnosis present

## 2018-01-03 DIAGNOSIS — G936 Cerebral edema: Secondary | ICD-10-CM | POA: Diagnosis present

## 2018-01-03 DIAGNOSIS — D72829 Elevated white blood cell count, unspecified: Secondary | ICD-10-CM

## 2018-01-03 DIAGNOSIS — G8194 Hemiplegia, unspecified affecting left nondominant side: Secondary | ICD-10-CM | POA: Diagnosis not present

## 2018-01-03 DIAGNOSIS — I493 Ventricular premature depolarization: Secondary | ICD-10-CM | POA: Diagnosis present

## 2018-01-03 DIAGNOSIS — I071 Rheumatic tricuspid insufficiency: Secondary | ICD-10-CM | POA: Diagnosis present

## 2018-01-03 DIAGNOSIS — R401 Stupor: Secondary | ICD-10-CM

## 2018-01-03 DIAGNOSIS — Z9079 Acquired absence of other genital organ(s): Secondary | ICD-10-CM

## 2018-01-03 DIAGNOSIS — E876 Hypokalemia: Secondary | ICD-10-CM | POA: Diagnosis present

## 2018-01-03 DIAGNOSIS — J9811 Atelectasis: Secondary | ICD-10-CM | POA: Diagnosis present

## 2018-01-03 DIAGNOSIS — D701 Agranulocytosis secondary to cancer chemotherapy: Secondary | ICD-10-CM | POA: Diagnosis present

## 2018-01-03 DIAGNOSIS — I69354 Hemiplegia and hemiparesis following cerebral infarction affecting left non-dominant side: Secondary | ICD-10-CM | POA: Diagnosis not present

## 2018-01-03 DIAGNOSIS — J9690 Respiratory failure, unspecified, unspecified whether with hypoxia or hypercapnia: Secondary | ICD-10-CM

## 2018-01-03 DIAGNOSIS — G9349 Other encephalopathy: Secondary | ICD-10-CM | POA: Diagnosis present

## 2018-01-03 DIAGNOSIS — E871 Hypo-osmolality and hyponatremia: Secondary | ICD-10-CM | POA: Diagnosis present

## 2018-01-03 DIAGNOSIS — I1310 Hypertensive heart and chronic kidney disease without heart failure, with stage 1 through stage 4 chronic kidney disease, or unspecified chronic kidney disease: Secondary | ICD-10-CM | POA: Diagnosis present

## 2018-01-03 DIAGNOSIS — F1290 Cannabis use, unspecified, uncomplicated: Secondary | ICD-10-CM | POA: Diagnosis present

## 2018-01-03 DIAGNOSIS — Z79899 Other long term (current) drug therapy: Secondary | ICD-10-CM

## 2018-01-03 DIAGNOSIS — I6322 Cerebral infarction due to unspecified occlusion or stenosis of basilar arteries: Secondary | ICD-10-CM | POA: Diagnosis present

## 2018-01-03 DIAGNOSIS — F329 Major depressive disorder, single episode, unspecified: Secondary | ICD-10-CM | POA: Diagnosis present

## 2018-01-03 DIAGNOSIS — R000 Tachycardia, unspecified: Secondary | ICD-10-CM | POA: Diagnosis present

## 2018-01-03 DIAGNOSIS — Z978 Presence of other specified devices: Secondary | ICD-10-CM

## 2018-01-03 DIAGNOSIS — Z9221 Personal history of antineoplastic chemotherapy: Secondary | ICD-10-CM

## 2018-01-03 DIAGNOSIS — C779 Secondary and unspecified malignant neoplasm of lymph node, unspecified: Secondary | ICD-10-CM | POA: Diagnosis present

## 2018-01-03 DIAGNOSIS — C801 Malignant (primary) neoplasm, unspecified: Secondary | ICD-10-CM | POA: Diagnosis present

## 2018-01-03 DIAGNOSIS — I651 Occlusion and stenosis of basilar artery: Secondary | ICD-10-CM

## 2018-01-03 DIAGNOSIS — D702 Other drug-induced agranulocytosis: Secondary | ICD-10-CM

## 2018-01-03 DIAGNOSIS — I6350 Cerebral infarction due to unspecified occlusion or stenosis of unspecified cerebral artery: Secondary | ICD-10-CM

## 2018-01-03 DIAGNOSIS — F17290 Nicotine dependence, other tobacco product, uncomplicated: Secondary | ICD-10-CM | POA: Diagnosis present

## 2018-01-03 HISTORY — PX: IR ANGIO VERTEBRAL SEL VERTEBRAL UNI L MOD SED: IMG5367

## 2018-01-03 HISTORY — PX: IR PERCUTANEOUS ART THROMBECTOMY/INFUSION INTRACRANIAL INC DIAG ANGIO: IMG6087

## 2018-01-03 HISTORY — PX: IR INTRA CRAN STENT: IMG2345

## 2018-01-03 HISTORY — PX: RADIOLOGY WITH ANESTHESIA: SHX6223

## 2018-01-03 HISTORY — PX: IR CT HEAD LTD: IMG2386

## 2018-01-03 HISTORY — PX: IR ANGIO INTRA EXTRACRAN SEL COM CAROTID INNOMINATE BILAT MOD SED: IMG5360

## 2018-01-03 LAB — BLOOD GAS, VENOUS
ACID-BASE DEFICIT: 0.3 mmol/L (ref 0.0–2.0)
BICARBONATE: 22.9 mmol/L (ref 20.0–28.0)
O2 Saturation: 94.5 %
PCO2 VEN: 34.4 mmHg — AB (ref 44.0–60.0)
Patient temperature: 98.6
pH, Ven: 7.439 — ABNORMAL HIGH (ref 7.250–7.430)
pO2, Ven: 73.4 mmHg — ABNORMAL HIGH (ref 32.0–45.0)

## 2018-01-03 LAB — MAGNESIUM: Magnesium: 1.8 mg/dL (ref 1.7–2.4)

## 2018-01-03 LAB — CBC WITH DIFFERENTIAL/PLATELET
ABS IMMATURE GRANULOCYTES: 0.12 10*3/uL — AB (ref 0.00–0.07)
BASOS PCT: 0 %
Basophils Absolute: 0 10*3/uL (ref 0.0–0.1)
EOS PCT: 0 %
Eosinophils Absolute: 0 10*3/uL (ref 0.0–0.5)
HCT: 35.7 % — ABNORMAL LOW (ref 39.0–52.0)
HEMOGLOBIN: 12 g/dL — AB (ref 13.0–17.0)
Immature Granulocytes: 1 %
LYMPHS PCT: 12 %
Lymphs Abs: 1.8 10*3/uL (ref 0.7–4.0)
MCH: 32.4 pg (ref 26.0–34.0)
MCHC: 33.6 g/dL (ref 30.0–36.0)
MCV: 96.5 fL (ref 80.0–100.0)
MONO ABS: 0.1 10*3/uL (ref 0.1–1.0)
Monocytes Relative: 1 %
NEUTROS ABS: 13.1 10*3/uL — AB (ref 1.7–7.7)
Neutrophils Relative %: 86 %
Platelets: 298 10*3/uL (ref 150–400)
RBC: 3.7 MIL/uL — AB (ref 4.22–5.81)
RDW: 15 % (ref 11.5–15.5)
WBC: 15.1 10*3/uL — ABNORMAL HIGH (ref 4.0–10.5)
nRBC: 0 % (ref 0.0–0.2)

## 2018-01-03 LAB — COMPREHENSIVE METABOLIC PANEL
ALT: 12 U/L (ref 0–44)
AST: 20 U/L (ref 15–41)
Albumin: 4 g/dL (ref 3.5–5.0)
Alkaline Phosphatase: 63 U/L (ref 38–126)
Anion gap: 14 (ref 5–15)
BILIRUBIN TOTAL: 1 mg/dL (ref 0.3–1.2)
BUN: 21 mg/dL — AB (ref 6–20)
CALCIUM: 9.7 mg/dL (ref 8.9–10.3)
CO2: 26 mmol/L (ref 22–32)
CREATININE: 1.01 mg/dL (ref 0.61–1.24)
Chloride: 100 mmol/L (ref 98–111)
GLUCOSE: 103 mg/dL — AB (ref 70–99)
Potassium: 3.4 mmol/L — ABNORMAL LOW (ref 3.5–5.1)
Sodium: 140 mmol/L (ref 135–145)
TOTAL PROTEIN: 7.1 g/dL (ref 6.5–8.1)

## 2018-01-03 LAB — PHOSPHORUS: Phosphorus: 2.9 mg/dL (ref 2.5–4.6)

## 2018-01-03 LAB — CK: Total CK: 119 U/L (ref 49–397)

## 2018-01-03 LAB — TSH: TSH: 2.021 u[IU]/mL (ref 0.350–4.500)

## 2018-01-03 LAB — LIPASE, BLOOD: Lipase: 22 U/L (ref 11–51)

## 2018-01-03 LAB — I-STAT CG4 LACTIC ACID, ED
LACTIC ACID, VENOUS: 5.7 mmol/L — AB (ref 0.5–1.9)
LACTIC ACID, VENOUS: 3.63 mmol/L — AB (ref 0.5–1.9)

## 2018-01-03 LAB — MRSA PCR SCREENING: MRSA by PCR: NEGATIVE

## 2018-01-03 LAB — TROPONIN I: Troponin I: <0.03 ng/mL (ref <0.03)

## 2018-01-03 LAB — PROTIME-INR
INR: 1.16
PROTHROMBIN TIME: 14.7 s (ref 11.4–15.2)

## 2018-01-03 LAB — APTT: aPTT: 23 seconds — ABNORMAL LOW (ref 24–36)

## 2018-01-03 LAB — CBG MONITORING, ED: Glucose-Capillary: 111 mg/dL — ABNORMAL HIGH (ref 70–99)

## 2018-01-03 LAB — PROCALCITONIN: Procalcitonin: <0.1 ng/mL

## 2018-01-03 SURGERY — IR WITH ANESTHESIA
Anesthesia: General

## 2018-01-03 MED ORDER — ETOMIDATE 2 MG/ML IV SOLN
INTRAVENOUS | Status: AC | PRN
  Administered 2018-01-03: 19.8 mg via INTRAVENOUS

## 2018-01-03 MED ORDER — LEVETIRACETAM IN NACL 1000 MG/100ML IV SOLN
1000.0000 mg | Freq: Once | INTRAVENOUS | Status: AC
  Administered 2018-01-03: 1000 mg via INTRAVENOUS
  Filled 2018-01-03: qty 100

## 2018-01-03 MED ORDER — PROPOFOL 1000 MG/100ML IV EMUL
5.0000 ug/kg/min | INTRAVENOUS | Status: DC
  Administered 2018-01-04: 40 ug/kg/min via INTRAVENOUS
  Administered 2018-01-04 (×4): 80 ug/kg/min via INTRAVENOUS
  Administered 2018-01-04: 40 ug/kg/min via INTRAVENOUS
  Administered 2018-01-04: 80 ug/kg/min via INTRAVENOUS
  Administered 2018-01-04 – 2018-01-05 (×2): 50 ug/kg/min via INTRAVENOUS
  Filled 2018-01-03 (×9): qty 100

## 2018-01-03 MED ORDER — FENTANYL CITRATE (PF) 100 MCG/2ML IJ SOLN
100.0000 ug | INTRAMUSCULAR | Status: AC | PRN
  Administered 2018-01-03 – 2018-01-04 (×3): 100 ug via INTRAVENOUS
  Filled 2018-01-03 (×2): qty 2

## 2018-01-03 MED ORDER — DEXAMETHASONE SODIUM PHOSPHATE 10 MG/ML IJ SOLN
10.0000 mg | Freq: Once | INTRAMUSCULAR | Status: AC
  Administered 2018-01-03: 10 mg via INTRAVENOUS
  Filled 2018-01-03: qty 1

## 2018-01-03 MED ORDER — FENTANYL CITRATE (PF) 100 MCG/2ML IJ SOLN
100.0000 ug | INTRAMUSCULAR | Status: DC | PRN
  Administered 2018-01-04 – 2018-01-05 (×9): 100 ug via INTRAVENOUS
  Filled 2018-01-03 (×10): qty 2

## 2018-01-03 MED ORDER — PROPOFOL 500 MG/50ML IV EMUL
INTRAVENOUS | Status: DC | PRN
  Administered 2018-01-03: 75 ug/kg/min via INTRAVENOUS

## 2018-01-03 MED ORDER — SODIUM CHLORIDE 0.9 % IV SOLN
INTRAVENOUS | Status: DC | PRN
  Administered 2018-01-03: 15:00:00 via INTRAVENOUS

## 2018-01-03 MED ORDER — ETOMIDATE 2 MG/ML IV SOLN
0.3000 mg/kg | Freq: Once | INTRAVENOUS | Status: AC
  Administered 2018-01-03: 19.8 mg via INTRAVENOUS

## 2018-01-03 MED ORDER — EPTIFIBATIDE 20 MG/10ML IV SOLN
INTRAVENOUS | Status: AC | PRN
  Administered 2018-01-03 (×3): 1.5 mg via INTRAVENOUS

## 2018-01-03 MED ORDER — SUCCINYLCHOLINE CHLORIDE 20 MG/ML IJ SOLN
120.0000 mg | Freq: Once | INTRAMUSCULAR | Status: DC
  Filled 2018-01-03: qty 6

## 2018-01-03 MED ORDER — TICAGRELOR 90 MG PO TABS
90.0000 mg | ORAL_TABLET | Freq: Two times a day (BID) | ORAL | Status: DC
  Administered 2018-01-04 – 2018-01-15 (×10): 90 mg via ORAL
  Filled 2018-01-03 (×11): qty 1

## 2018-01-03 MED ORDER — CLONIDINE HCL 0.1 MG PO TABS
0.2000 mg | ORAL_TABLET | Freq: Two times a day (BID) | ORAL | Status: DC
  Administered 2018-01-04 – 2018-01-11 (×14): 0.2 mg
  Filled 2018-01-03 (×10): qty 2
  Filled 2018-01-03 (×3): qty 1
  Filled 2018-01-03 (×4): qty 2

## 2018-01-03 MED ORDER — FENTANYL CITRATE (PF) 100 MCG/2ML IJ SOLN
INTRAMUSCULAR | Status: AC
  Filled 2018-01-03: qty 4

## 2018-01-03 MED ORDER — SODIUM CHLORIDE 0.9 % IV SOLN
INTRAVENOUS | Status: DC
  Administered 2018-01-03 – 2018-01-10 (×10): via INTRAVENOUS

## 2018-01-03 MED ORDER — FENTANYL CITRATE (PF) 100 MCG/2ML IJ SOLN
INTRAMUSCULAR | Status: AC
  Filled 2018-01-03: qty 2

## 2018-01-03 MED ORDER — VANCOMYCIN HCL IN DEXTROSE 1-5 GM/200ML-% IV SOLN
1000.0000 mg | Freq: Once | INTRAVENOUS | Status: AC
  Administered 2018-01-03: 1000 mg via INTRAVENOUS
  Filled 2018-01-03: qty 200

## 2018-01-03 MED ORDER — STROKE: EARLY STAGES OF RECOVERY BOOK
Freq: Once | Status: AC
  Administered 2018-01-04: 11:00:00
  Filled 2018-01-03 (×2): qty 1

## 2018-01-03 MED ORDER — SODIUM CHLORIDE (PF) 0.9 % IJ SOLN
INTRAVENOUS | Status: AC | PRN
  Administered 2018-01-03: 25 ug via INTRA_ARTERIAL

## 2018-01-03 MED ORDER — ACETAMINOPHEN 650 MG RE SUPP
650.0000 mg | RECTAL | Status: DC | PRN
  Administered 2018-01-06: 650 mg via RECTAL
  Filled 2018-01-03: qty 1

## 2018-01-03 MED ORDER — FENTANYL CITRATE (PF) 100 MCG/2ML IJ SOLN
INTRAMUSCULAR | Status: AC | PRN
  Administered 2018-01-03: 200 ug via INTRAVENOUS

## 2018-01-03 MED ORDER — CLONIDINE HCL 0.2 MG PO TABS
0.2000 mg | ORAL_TABLET | Freq: Two times a day (BID) | ORAL | Status: DC
  Filled 2018-01-03: qty 1

## 2018-01-03 MED ORDER — CLEVIDIPINE BUTYRATE 0.5 MG/ML IV EMUL
INTRAVENOUS | Status: AC
  Filled 2018-01-03: qty 50

## 2018-01-03 MED ORDER — ACETAMINOPHEN 160 MG/5ML PO SOLN: 650.0000 mg | ORAL | Status: DC | PRN

## 2018-01-03 MED ORDER — DEXAMETHASONE 4 MG PO TABS
8.0000 mg | ORAL_TABLET | Freq: Once | ORAL | Status: AC
  Administered 2018-01-04: 8 mg
  Filled 2018-01-03: qty 2

## 2018-01-03 MED ORDER — TICAGRELOR 90 MG PO TABS
ORAL_TABLET | ORAL | Status: AC
  Filled 2018-01-03: qty 2

## 2018-01-03 MED ORDER — SODIUM CHLORIDE 0.9 % IV SOLN
INTRAVENOUS | Status: DC | PRN
  Administered 2018-01-03: 10 ug/min via INTRAVENOUS

## 2018-01-03 MED ORDER — SODIUM CHLORIDE 0.9 % IV SOLN
2.0000 g | Freq: Two times a day (BID) | INTRAVENOUS | Status: DC
  Administered 2018-01-03 (×2): 2 g via INTRAVENOUS
  Filled 2018-01-03 (×3): qty 20

## 2018-01-03 MED ORDER — ACETAMINOPHEN 160 MG/5ML PO SOLN
650.0000 mg | ORAL | Status: DC | PRN
  Administered 2018-01-04 – 2018-01-10 (×7): 650 mg
  Filled 2018-01-03 (×7): qty 20.3

## 2018-01-03 MED ORDER — NICARDIPINE HCL IN NACL 20-0.86 MG/200ML-% IV SOLN: 0.0000 mg/h | INTRAVENOUS | Status: DC

## 2018-01-03 MED ORDER — LIDOCAINE BOLUS VIA INFUSION
100.0000 mg | Freq: Once | INTRAVENOUS | Status: AC
  Administered 2018-01-03: 100 mg via INTRAVENOUS
  Filled 2018-01-03: qty 100

## 2018-01-03 MED ORDER — SODIUM CHLORIDE 0.9 % IV BOLUS
30.0000 mL/kg | Freq: Once | INTRAVENOUS | Status: AC
  Administered 2018-01-03: 12:00:00 via INTRAVENOUS

## 2018-01-03 MED ORDER — LORAZEPAM 2 MG/ML IJ SOLN
1.0000 mg | Freq: Once | INTRAMUSCULAR | Status: AC
  Administered 2018-01-03: 1 mg via INTRAVENOUS
  Filled 2018-01-03: qty 1

## 2018-01-03 MED ORDER — IOHEXOL 300 MG/ML  SOLN: 110.0000 mL | Freq: Once | INTRAMUSCULAR | Status: DC | PRN

## 2018-01-03 MED ORDER — ASPIRIN 81 MG PO CHEW
81.0000 mg | CHEWABLE_TABLET | Freq: Every day | ORAL | Status: DC
  Administered 2018-01-04 – 2018-01-12 (×9): 81 mg
  Filled 2018-01-03 (×9): qty 1

## 2018-01-03 MED ORDER — SUCCINYLCHOLINE CHLORIDE 20 MG/ML IJ SOLN
INTRAMUSCULAR | Status: AC | PRN
  Administered 2018-01-03: 120 mg via INTRAVENOUS

## 2018-01-03 MED ORDER — IOPAMIDOL (ISOVUE-370) INJECTION 76%
INTRAVENOUS | Status: AC
  Filled 2018-01-03: qty 100

## 2018-01-03 MED ORDER — ASPIRIN 325 MG PO TABS
ORAL_TABLET | ORAL | Status: AC
  Filled 2018-01-03: qty 1

## 2018-01-03 MED ORDER — TIROFIBAN HCL 3.75 MG/15ML IV CONC
INTRAVENOUS | Status: AC
  Filled 2018-01-03: qty 15

## 2018-01-03 MED ORDER — LORAZEPAM 2 MG/ML IJ SOLN
1.0000 mg | INTRAMUSCULAR | Status: DC | PRN
  Administered 2018-01-03: 1 mg via INTRAVENOUS
  Filled 2018-01-03: qty 1

## 2018-01-03 MED ORDER — ASPIRIN 81 MG PO CHEW
81.0000 mg | CHEWABLE_TABLET | Freq: Every day | ORAL | Status: DC
  Filled 2018-01-03: qty 1

## 2018-01-03 MED ORDER — SODIUM CHLORIDE 0.9 % IV SOLN
INTRAVENOUS | Status: DC
  Administered 2018-01-03: 1000 mL via INTRAVENOUS

## 2018-01-03 MED ORDER — LABETALOL HCL 5 MG/ML IV SOLN
10.0000 mg | INTRAVENOUS | Status: AC | PRN
  Administered 2018-01-03 – 2018-01-10 (×10): 10 mg via INTRAVENOUS
  Filled 2018-01-03 (×9): qty 4

## 2018-01-03 MED ORDER — ACETAMINOPHEN 650 MG RE SUPP: 650.0000 mg | RECTAL | Status: DC | PRN

## 2018-01-03 MED ORDER — FAMOTIDINE IN NACL 20-0.9 MG/50ML-% IV SOLN
20.0000 mg | Freq: Two times a day (BID) | INTRAVENOUS | Status: DC
  Administered 2018-01-03 – 2018-01-05 (×5): 20 mg via INTRAVENOUS
  Filled 2018-01-03 (×6): qty 50

## 2018-01-03 MED ORDER — FENTANYL CITRATE (PF) 100 MCG/2ML IJ SOLN
INTRAMUSCULAR | Status: DC | PRN
  Administered 2018-01-03 (×3): 50 ug via INTRAVENOUS
  Administered 2018-01-03: 100 ug via INTRAVENOUS
  Administered 2018-01-03: 50 ug via INTRAVENOUS

## 2018-01-03 MED ORDER — PROPOFOL 1000 MG/100ML IV EMUL
INTRAVENOUS | Status: AC
  Filled 2018-01-03: qty 100

## 2018-01-03 MED ORDER — NITROGLYCERIN 1 MG/10 ML FOR IR/CATH LAB
INTRA_ARTERIAL | Status: AC
  Filled 2018-01-03: qty 10

## 2018-01-03 MED ORDER — CHLORTHALIDONE 25 MG PO TABS
25.0000 mg | ORAL_TABLET | Freq: Every day | ORAL | Status: DC
  Administered 2018-01-04 – 2018-01-09 (×6): 25 mg
  Filled 2018-01-03 (×6): qty 1

## 2018-01-03 MED ORDER — CLOPIDOGREL BISULFATE 300 MG PO TABS
ORAL_TABLET | ORAL | Status: AC
  Filled 2018-01-03: qty 1

## 2018-01-03 MED ORDER — ROCURONIUM BROMIDE 50 MG/5ML IV SOSY
PREFILLED_SYRINGE | INTRAVENOUS | Status: DC | PRN
  Administered 2018-01-03: 30 mg via INTRAVENOUS
  Administered 2018-01-03: 50 mg via INTRAVENOUS
  Administered 2018-01-03: 20 mg via INTRAVENOUS
  Administered 2018-01-03: 50 mg via INTRAVENOUS

## 2018-01-03 MED ORDER — ACETAMINOPHEN 325 MG PO TABS: 650.0000 mg | ORAL_TABLET | ORAL | Status: DC | PRN

## 2018-01-03 MED ORDER — IOPAMIDOL (ISOVUE-370) INJECTION 76%
100.0000 mL | Freq: Once | INTRAVENOUS | Status: AC | PRN
  Administered 2018-01-03: 100 mL via INTRAVENOUS

## 2018-01-03 MED ORDER — CLEVIDIPINE BUTYRATE 0.5 MG/ML IV EMUL
0.0000 mg/h | INTRAVENOUS | Status: DC
  Administered 2018-01-03: 10 mg/h via INTRAVENOUS
  Administered 2018-01-03: 24 mg/h via INTRAVENOUS
  Administered 2018-01-03: 26 mg/h via INTRAVENOUS
  Administered 2018-01-04: 28 mg/h via INTRAVENOUS
  Administered 2018-01-04 (×9): 30 mg/h via INTRAVENOUS
  Filled 2018-01-03 (×6): qty 50
  Filled 2018-01-03: qty 100
  Filled 2018-01-03 (×4): qty 50
  Filled 2018-01-03 (×2): qty 100
  Filled 2018-01-03 (×2): qty 50
  Filled 2018-01-03: qty 100
  Filled 2018-01-03: qty 50

## 2018-01-03 MED ORDER — DEXTROSE 5 % IV SOLN
10.0000 mg/kg | Freq: Once | INTRAVENOUS | Status: AC
  Administered 2018-01-03: 660 mg via INTRAVENOUS
  Filled 2018-01-03: qty 13.2

## 2018-01-03 MED ORDER — CHLORTHALIDONE 25 MG PO TABS
25.0000 mg | ORAL_TABLET | Freq: Every day | ORAL | Status: DC
Start: 2018-01-03 — End: 2018-01-03
  Filled 2018-01-03: qty 1

## 2018-01-03 MED ORDER — PROPOFOL 1000 MG/100ML IV EMUL
5.0000 ug/kg/min | INTRAVENOUS | Status: DC
  Administered 2018-01-03: 5 ug/kg/min via INTRAVENOUS
  Administered 2018-01-03: 80 ug/kg/min via INTRAVENOUS
  Filled 2018-01-03 (×2): qty 100

## 2018-01-03 MED ORDER — LIDOCAINE HCL 1 % IJ SOLN
INTRAMUSCULAR | Status: AC
  Filled 2018-01-03: qty 20

## 2018-01-03 MED ORDER — ORAL CARE MOUTH RINSE
15.0000 mL | OROMUCOSAL | Status: DC
  Administered 2018-01-03 – 2018-01-06 (×26): 15 mL via OROMUCOSAL

## 2018-01-03 MED ORDER — LABETALOL HCL 5 MG/ML IV SOLN
INTRAVENOUS | Status: AC
  Filled 2018-01-03: qty 4

## 2018-01-03 MED ORDER — SODIUM CHLORIDE (PF) 0.9 % IJ SOLN
INTRAVENOUS | Status: AC | PRN
  Administered 2018-01-03 (×4): 25 ug via INTRA_ARTERIAL

## 2018-01-03 MED ORDER — CHLORHEXIDINE GLUCONATE 0.12% ORAL RINSE (MEDLINE KIT)
15.0000 mL | Freq: Two times a day (BID) | OROMUCOSAL | Status: DC
  Administered 2018-01-03 – 2018-01-06 (×6): 15 mL via OROMUCOSAL

## 2018-01-03 MED ORDER — SODIUM CHLORIDE (PF) 0.9 % IJ SOLN
INTRAMUSCULAR | Status: AC
  Filled 2018-01-03: qty 50

## 2018-01-03 MED ORDER — CLEVIDIPINE BUTYRATE 0.5 MG/ML IV EMUL
INTRAVENOUS | Status: DC | PRN
  Administered 2018-01-03: 1 mg/h via INTRAVENOUS

## 2018-01-03 MED ORDER — AMLODIPINE BESYLATE 5 MG PO TABS
10.0000 mg | ORAL_TABLET | Freq: Every day | ORAL | Status: DC
  Administered 2018-01-03: 10 mg via ORAL
  Filled 2018-01-03: qty 2

## 2018-01-03 MED ORDER — ASPIRIN 81 MG PO CHEW
CHEWABLE_TABLET | ORAL | Status: AC | PRN
  Administered 2018-01-03: 81 mg via NASOGASTRIC

## 2018-01-03 MED ORDER — ASPIRIN 81 MG PO CHEW
CHEWABLE_TABLET | ORAL | Status: AC
  Filled 2018-01-03: qty 1

## 2018-01-03 MED ORDER — TICAGRELOR 60 MG PO TABS
ORAL_TABLET | ORAL | Status: AC | PRN
  Administered 2018-01-03: 180 mg via NASOGASTRIC

## 2018-01-03 MED ORDER — AMLODIPINE BESYLATE 10 MG PO TABS
10.0000 mg | ORAL_TABLET | Freq: Every day | ORAL | Status: DC
  Administered 2018-01-04 – 2018-01-12 (×9): 10 mg
  Filled 2018-01-03 (×3): qty 1
  Filled 2018-01-03: qty 2
  Filled 2018-01-03 (×3): qty 1
  Filled 2018-01-03 (×2): qty 2

## 2018-01-03 MED ORDER — EPTIFIBATIDE 20 MG/10ML IV SOLN
INTRAVENOUS | Status: AC
  Filled 2018-01-03: qty 10

## 2018-01-03 MED ORDER — TIROFIBAN HCL IN NACL 5-0.9 MG/100ML-% IV SOLN
INTRAVENOUS | Status: AC
  Filled 2018-01-03: qty 100

## 2018-01-03 MED ORDER — SENNOSIDES-DOCUSATE SODIUM 8.6-50 MG PO TABS: 1.0000 | ORAL_TABLET | Freq: Every evening | ORAL | Status: DC | PRN

## 2018-01-03 MED ORDER — TICAGRELOR 90 MG PO TABS
90.0000 mg | ORAL_TABLET | Freq: Two times a day (BID) | ORAL | Status: DC
  Administered 2018-01-04 – 2018-01-12 (×12): 90 mg
  Filled 2018-01-03 (×14): qty 1

## 2018-01-03 MED ORDER — FENTANYL CITRATE (PF) 100 MCG/2ML IJ SOLN
200.0000 ug | Freq: Once | INTRAMUSCULAR | Status: DC
  Filled 2018-01-03: qty 4

## 2018-01-03 NOTE — Progress Notes (Signed)
Patient ID: Caleb Hubbard, male   DOB: 03-14-1980, 37 y.o.   MRN: [id number] INR. 37 Y RT H M LSW 10 pm LN.mRSS 0 New onset on seizure like activity with progressive decrease in LOC. CT Brain early  hypoattenuation  in the cerebellar  hemispheres. 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 No hydrocephalus noted. Findings discussed with spouse and family members.They were informed of the severe mortality and morbidity associated with the MRI findings. Option of revacularization was discussed given the young age of the patient and potential for partial reversibility of the brainstem findings with revascularization. Procedure was discussed with risks of 10 % ICH worsening neuro function,vent dependency ,death and inability to revascularize.  The spouse expressed understanding and  consented to the procedure. S.Arneta Mahmood MD

## 2018-01-03 NOTE — ED Notes (Signed)
Note: his IV bolus requirement comes to almost 1 liter; therefore I am in the process of giving him a liter IV bolus of NSS.

## 2018-01-03 NOTE — ED Provider Notes (Signed)
Patient arrived from [facility] long emergency department.  Excepting apparently Dr. Cheral Marker.  However patient now intubated.  May require critical care to be involved with the admission.  Patient has a posterior circulation clot.  Patient brought here to have immediate MRI.  Patient is intubated.   Fredia Sorrow, MD 01/03/18 [phone]

## 2018-01-03 NOTE — Progress Notes (Signed)
A consult was received from an ED physician for vanc/acyclovir per pharmacy dosing.  The patient's profile has been reviewed for ht/wt/allergies/indication/available labs.   A one time order has been placed for vanc 1g and acyclovir 10mg /kg based on weight from May of 66kg .  Further antibiotics/pharmacy consults should be ordered by admitting physician if indicated.                       Thank you, Kara Mead 01/03/2018  11:27 AM

## 2018-01-03 NOTE — Transfer of Care (Signed)
Immediate Anesthesia Transfer of Care Note  Patient: Caleb Hubbard  Procedure(s) Performed: CODE STROKE (N/A )  Patient Location: ICU  Anesthesia Type:General  Level of Consciousness: unresponsive and Patient remains intubated per anesthesia plan  Airway & Oxygen Therapy: Patient remains intubated per anesthesia plan and Patient placed on Ventilator (see vital sign flow sheet for setting)  Post-op Assessment: Report given to RN and Post -op Vital signs reviewed and stable  Post vital signs: Reviewed and stable  Last Vitals:  Vitals Value Taken Time  BP 133/99 01/03/2018  6:32 PM  Temp    Pulse 106 01/03/2018  6:40 PM  Resp 25 01/03/2018  6:40 PM  SpO2 100 % 01/03/2018  6:40 PM  Vitals shown include unvalidated device data.  Last Pain:  Vitals:   01/03/18 1245  TempSrc:   PainSc: (S) Asleep         Complications: No apparent anesthesia complications

## 2018-01-03 NOTE — ED Notes (Signed)
Bed: LM76 Expected date: 01/03/18 Expected time: 10:14 AM Means of arrival:  Comments: Cancer pt

## 2018-01-03 NOTE — ED Notes (Signed)
To MRI - tubing changed for propofol gtt for compatibility in MRI

## 2018-01-03 NOTE — ED Notes (Signed)
The neurologist has just arrived and is examining him and speaking with his family.

## 2018-01-03 NOTE — Anesthesia Procedure Notes (Signed)
Arterial Line Insertion Start/End11/23/2019 3:34 PM, 01/03/2018 3:38 PM Performed by: Suzy Bouchard, CRNA  Patient location: OOR procedure area. radial was placed Catheter size: 20 G Hand hygiene performed , maximum sterile barriers used  and Seldinger technique used  Attempts: 1 Procedure performed without using ultrasound guided technique. Following insertion, dressing applied and Biopatch. Post procedure assessment: normal  Patient tolerated the procedure well with no immediate complications.

## 2018-01-03 NOTE — Anesthesia Preprocedure Evaluation (Addendum)
Anesthesia Evaluation  Patient identified by MRN, date of birth, ID band Patient unresponsive    Reviewed: Allergy & Precautions, NPO status , Patient's Chart, lab work & pertinent test results, Unable to perform ROS - Chart review onlyPreop documentation limited or incomplete due to emergent nature of procedure.  Airway Mallampati: Intubated  TM Distance: >3 FB     Dental   Pulmonary Current Smoker,    Pulmonary exam normal breath sounds clear to auscultation       Cardiovascular hypertension, Pt. on medications Normal cardiovascular exam Rhythm:Regular Rate:Normal     Neuro/Psych  Headaches, PSYCHIATRIC DISORDERS Anxiety CVA    GI/Hepatic negative GI ROS, (+)     substance abuse  marijuana use,   Endo/Other  negative endocrine ROS  Renal/GU negative Renal ROS   H/o testicular cancer    Musculoskeletal negative musculoskeletal ROS (+)   Abdominal   Peds  Hematology  (+) Blood dyscrasia, anemia ,   Anesthesia Other Findings Day of surgery medications reviewed with the patient.  Reproductive/Obstetrics                             Anesthesia Physical Anesthesia Plan  ASA: V and emergent  Anesthesia Plan: General   Post-op Pain Management:    Induction: Inhalational  PONV Risk Score and Plan: 1 and Treatment may vary due to age or medical condition and Propofol infusion  Airway Management Planned: Oral ETT  Additional Equipment:   Intra-op Plan:   Post-operative Plan: Post-operative intubation/ventilation  Informed Consent:   Only emergency history available and History available from chart only  Plan Discussed with: CRNA  Anesthesia Plan Comments: (CODE STROKE. Patient intubated at [facility] ED.)       Anesthesia Quick Evaluation

## 2018-01-03 NOTE — Progress Notes (Addendum)
Pt arrived to [facility] ER via [facility].  #7.5 ETT secured 26@lip .  Pt placed on full support vent & transported to MRI.

## 2018-01-03 NOTE — ED Notes (Signed)
Patient returned from [facility] with RN transporting.

## 2018-01-03 NOTE — Anesthesia Postprocedure Evaluation (Signed)
Anesthesia Post Note  Patient: Caleb Hubbard  Procedure(s) Performed: CODE STROKE (N/A )     Patient location during evaluation: SICU Anesthesia Type: General Level of consciousness: sedated and patient remains intubated per anesthesia plan Pain management: pain level controlled Vital Signs Assessment: post-procedure vital signs reviewed and stable Respiratory status: patient remains intubated per anesthesia plan Cardiovascular status: stable Postop Assessment: no apparent nausea or vomiting Anesthetic complications: no    Last Vitals:  Vitals:   01/03/18 1915 01/03/18 1930  BP: (!) 135/101 (!) 138/101  Pulse:    Resp: 15 15  Temp:    SpO2:      Last Pain:  Vitals:   01/03/18 1841  TempSrc: Oral  PainSc:                  Catalina Gravel

## 2018-01-03 NOTE — ED Provider Notes (Signed)
[facility] DEPT Provider Note   CSN: [id number] Arrival date & time: 01/03/18  1037     History   Chief Complaint Chief Complaint  Patient presents with  . Nausea  . Chemotherapy    HPI Caleb Hubbard is a 37 y.o. male.  HPI Patient was well yesterday evening.  He went to eat with his son and was home at about 8 PM.  The patient's wife reports that she went to bed early and 8 PM was last time anyone spoke to him before everyone had dispersed her going to bed.  At 4 AM patient's wife noted him to be awake but not seeming normal.  He seemed very confused.  It did not immediately strike her that there was something really serious going on.  She waited a bit of time and he seemed to be worse with mumbling speech that was not intelligible and groaning.  He seemed to be clenching and sweating.  She was not able to do anything to alleviate the symptoms and ultimately called EMS as his condition seemed to be deteriorating.  She reports that he was normal last night in terms of speech behavior and activity.  He has been undergoing chemotherapy this week for history of testicular cancer.  He successfully underwent orchiectomy without apparent complications in May.  He has been undergoing chemotherapy and was treated this week.  He is getting cisplatin, Decadron, etoposide.  He has tolerated this previously without similar events.  Patient's wife reports that he chronically complains of headache.  She reports is a daily event for him.  She reports sometimes his vision becomes blurry and that waxes and wanes.  There was not any particular change in reports of headache or associated symptoms yesterday.  She reports this is been going on since before he got diagnosed with his testicular cancer. Past Medical History:  Diagnosis Date  . Anxiety   . Cancer ([place])   . Headache   . Hypertension   . Testicular mass     There are no active problems to display for this  patient.   Past Surgical History:  Procedure Laterality Date  . NO PAST SURGERIES    . ORCHIECTOMY Right 06/17/2017   Procedure: RIGHT RADICAL ORCHIECTOMY;  Surgeon: Ceasar Mons, MD;  Location: [facility];  Service: Urology;  Laterality: Right;        Home Medications    Prior to Admission medications   Medication Sig Start Date End Date Taking? Authorizing Provider  amLODipine (NORVASC) 10 MG tablet Take 10 mg by mouth daily. 12/29/17 12/29/18 Yes [provider]  chlorthalidone (HYGROTON) 25 MG tablet Take 25 mg by mouth daily. 11/20/17  Yes [provider]  cloNIDine (CATAPRES) 0.2 MG tablet Take 0.2 mg by mouth 2 (two) times daily.   Yes [provider]  dexamethasone (DECADRON) 4 MG tablet Take 4 mg by mouth See admin instructions. Take 2 tablets by mouth once daily for 3 days start the day after chemotherapy and continue for 3 days 11/26/17   [provider]  OLANZapine (ZYPREXA) 10 MG tablet Take 10 mg by mouth See admin instructions. Take 1 tablet by mouth nightly for 3 doses start the day after chemotherapy and continue for 3 days 11/26/17   [provider]  ondansetron (ZOFRAN) 8 MG tablet Take 8 mg by mouth every 8 (eight) hours as needed for nausea/vomiting. 11/26/17   [provider]  prochlorperazine (COMPAZINE) 10 MG tablet Take 10  mg by mouth every 6 (six) hours as needed for nausea/vomiting. 11/26/17   [provider]    Family History No family history on file.  Social History Social History   Tobacco Use  . Smoking status: Current Every Day Smoker    Types: Cigars  . Smokeless tobacco: Never Used  Substance Use Topics  . Alcohol use: No  . Drug use: Yes    Types: Marijuana    Comment: LAST USE MARIJUANA 06-07-17     Allergies   Patient has no known allergies.   Review of Systems Review of Systems Level 5 caveat cannot obtain review of systems due to patient extremis.   Physical  Exam Updated Vital Signs BP (!) 158/109   Pulse 90   Temp (!) 100.4 F (38 C) (Rectal)   Resp 17   Ht 5\' 8"  (1.727 m)   Wt 66 kg   SpO2 100%   BMI 22.12 kg/m   Physical Exam  Constitutional:  Patient is slender in appearance.  He is severely ill in appearance.  He is profusely diaphoretic and exhibiting rigor looking motions with extension type posturing of the upper extremities.  HENT:  Head is normocephalic atraumatic.  Nares are patent without drainage or discharge.  Airway is patent but patient is clenching his teeth periodically.  No blood or evidence of intraoral trauma.  Eyes:  Eyes seem to deviate more frequently to the right.  He seems to keep the head slightly turned and eyes right but he does have roving eye motions that will bring him to midline.  Cannot discern if there is some degree of disconjugate motion.  Pupils are symmetric and responsive.  Neck:  Patient seems to have a preference for turning head to the right but the neck is supple without being overtly meningitic.  Cardiovascular:  Tachycardia.  Cannot appreciate for rub murmur gallop.  Pedal pulses are 2+ and intact.  Radial pulses 2+ intact.  Pulmonary/Chest:  Lung fields are grossly clear.  Patient is breathing without distress but sometimes is groaning and clenching.  Abdominal:  Abdomen is soft and nondistended.  Genitourinary:  Genitourinary Comments: Penis is normal in appearance.  Scrotum is well-healed.  There is one testicle present.  No edema erythema or soft tissue anomaly of significance.  Musculoskeletal:  All extremities are symmetric and well-developed.  Patient is very thin but has good muscular tone.  No peripheral edema.  The feet are warm and dry.  No injuries or deformities.  Distal pulses are 2+ and symmetric on extremities.  Neurological:  Patient is obtunded.  He has not made any intelligible vocalization.  He makes intermittent groaning sounds.  At times he does extensor like posturing.   With stimulus he flexes at the arms but this does not seem purposeful.  There is extension of the feet.  He has tonic appearance but does not have rapid tonic-clonic motions.  With stimulus there is gritting of the teeth.  Patient is hyperreflexic x4.  Skin:  Skin is variably diaphoretic or dry but warm depending on degree of rigor-like motion.  No evident rashes.     ED Treatments / Results  Labs (all labs ordered are listed, but only abnormal results are displayed) Labs Reviewed  COMPREHENSIVE METABOLIC PANEL - Abnormal; Notable for the following components:      Result Value   Potassium 3.4 (*)    Glucose, Bld 103 (*)    BUN 21 (*)    All other components within  normal limits  CBC WITH DIFFERENTIAL/PLATELET - Abnormal; Notable for the following components:   WBC 15.1 (*)    RBC 3.70 (*)    Hemoglobin 12.0 (*)    HCT 35.7 (*)    Neutro Abs 13.1 (*)    Abs Immature Granulocytes 0.12 (*)    All other components within normal limits  BLOOD GAS, VENOUS - Abnormal; Notable for the following components:   pH, Ven 7.439 (*)    pCO2, Ven 34.4 (*)    pO2, Ven 73.4 (*)    All other components within normal limits  APTT - Abnormal; Notable for the following components:   aPTT 23 (*)    All other components within normal limits  I-STAT CG4 LACTIC ACID, ED - Abnormal; Notable for the following components:   Lactic Acid, Venous 5.70 (*)    All other components within normal limits  I-STAT CG4 LACTIC ACID, ED - Abnormal; Notable for the following components:   Lactic Acid, Venous 3.63 (*)    All other components within normal limits  CBG MONITORING, ED - Abnormal; Notable for the following components:   Glucose-Capillary 111 (*)    All other components within normal limits  CULTURE, BLOOD (ROUTINE X 2)  CULTURE, BLOOD (ROUTINE X 2)  LIPASE, BLOOD  PROCALCITONIN  PROTIME-INR  CK  PHOSPHORUS  MAGNESIUM  TSH  TROPONIN I  URINALYSIS, ROUTINE W REFLEX MICROSCOPIC  URINALYSIS,  COMPLETE (UACMP) WITH MICROSCOPIC  I-STAT CG4 LACTIC ACID, ED    EKG EKG Interpretation  Date/Time:  Saturday January 03 2018 11:02:29 EST Ventricular Rate:  84 PR Interval:    QRS Duration: 93 QT Interval:  344 QTC Calculation: 407 R Axis:   67 Text Interpretation:  Sinus rhythm baseline artifact. PVC. nbo STEMI. Dynamic T wave compared to previous. Confirmed by Charlesetta Shanks [phone]) on 01/03/2018 12:10:42 PM   Radiology Ct Angio Head W Or Wo Contrast  Result Date: 01/03/2018 CLINICAL DATA:  Altered mental status. EXAM: CT ANGIOGRAPHY HEAD AND NECK TECHNIQUE: Multidetector CT imaging of the head and neck was performed using the standard protocol during bolus administration of intravenous contrast. Multiplanar CT image reconstructions and MIPs were obtained to evaluate the vascular anatomy. Carotid stenosis measurements (when applicable) are obtained utilizing NASCET criteria, using the distal internal carotid diameter as the denominator. CONTRAST:  145mL ISOVUE-370 IOPAMIDOL (ISOVUE-370) INJECTION 76% COMPARISON:  Noncontrast head CT earlier today FINDINGS: CTA NECK FINDINGS Aortic arch: Normal.  Three vessel branching. Right carotid system: Limited by motion but no stenosis, ulceration, or dissection. Left carotid system: Limited by motion but no stenosis, ulceration, or dissection. There is mild atherosclerotic plaque at the ICA bulb. Vertebral arteries: No proximal subclavian stenosis or atherosclerosis. Both vertebral arteries are motion degraded but where not distorted they are widely patent. Skeleton: Accounting for motion there is no acute finding Other neck: Nasal trumpet in place. Upper chest: Negative Review of the MIP images confirms the above findings CTA HEAD FINDINGS Anterior circulation: Limited by motion. No major branch occlusion flow limiting stenosis is seen. No gross aneurysm. Posterior circulation: Mild right vertebral artery dominance. Occluded basilar at the  vertebrobasilar junction with reconstitution at the terminus. Venous sinuses: Hypoplastic on the right. The dural venous sinuses are patent. Anatomic variants: Negative for aneurysm. Delayed phase: Patient's acute infarcts are nonenhancing. Critical Value/emergent results were called by telephone at the time of interpretation on 01/03/2018 at 1:20 pm to Dr. Charlesetta Shanks , who verbally acknowledged these results. Review of the MIP  images confirms the above findings IMPRESSION: 1. 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 2. No embolic source seen in the neck, although limited by significant motion artifact. Electronically Signed   By: Monte Fantasia M.D.   On: 01/03/2018 13:27   Ct Head Wo Contrast  Result Date: 01/03/2018 CLINICAL DATA:  Altered level of consciousness. Seizure-like activity EXAM: CT HEAD WITHOUT CONTRAST TECHNIQUE: Contiguous axial images were obtained from the base of the skull through the vertex without intravenous contrast. COMPARISON:  None. FINDINGS: Brain: Patchy low-density in the bilateral cerebellum and occipital lobes. No low-density seen in the anterior circulation distribution. No hemorrhage, hydrocephalus, or mass effect. Vascular: No hyperdense vessel, including the basilar Skull: Negative Sinuses/Orbits: Negative Other: Critical Value/emergent results were called by telephone at the time of interpretation on 01/03/2018 at 12:43 pm to Dr. Jeannie Done Madelynn Malson , who verbally acknowledged these results. IMPRESSION: Patchy low-density in the bilateral cerebellum and occipital lobes. This pattern suggests posterior circulation acute infarcts-consider CTA. Given young age and recent chemotherapy underlying PRES is a consideration-although no symmetric cerebral edema. Metastatic disease with edema is possible but less likely given the distribution. Electronically Signed   By: Monte Fantasia M.D.   On: 01/03/2018 12:45   Ct Angio Neck W And/or Wo  Contrast  Result Date: 01/03/2018 CLINICAL DATA:  Altered mental status. EXAM: CT ANGIOGRAPHY HEAD AND NECK TECHNIQUE: Multidetector CT imaging of the head and neck was performed using the standard protocol during bolus administration of intravenous contrast. Multiplanar CT image reconstructions and MIPs were obtained to evaluate the vascular anatomy. Carotid stenosis measurements (when applicable) are obtained utilizing NASCET criteria, using the distal internal carotid diameter as the denominator. CONTRAST:  130mL ISOVUE-370 IOPAMIDOL (ISOVUE-370) INJECTION 76% COMPARISON:  Noncontrast head CT earlier today FINDINGS: CTA NECK FINDINGS Aortic arch: Normal.  Three vessel branching. Right carotid system: Limited by motion but no stenosis, ulceration, or dissection. Left carotid system: Limited by motion but no stenosis, ulceration, or dissection. There is mild atherosclerotic plaque at the ICA bulb. Vertebral arteries: No proximal subclavian stenosis or atherosclerosis. Both vertebral arteries are motion degraded but where not distorted they are widely patent. Skeleton: Accounting for motion there is no acute finding Other neck: Nasal trumpet in place. Upper chest: Negative Review of the MIP images confirms the above findings CTA HEAD FINDINGS Anterior circulation: Limited by motion. No major branch occlusion flow limiting stenosis is seen. No gross aneurysm. Posterior circulation: Mild right vertebral artery dominance. Occluded basilar at the vertebrobasilar junction with reconstitution at the terminus. Venous sinuses: Hypoplastic on the right. The dural venous sinuses are patent. Anatomic variants: Negative for aneurysm. Delayed phase: Patient's acute infarcts are nonenhancing. Critical Value/emergent results were called by telephone at the time of interpretation on 01/03/2018 at 1:20 pm to Dr. Charlesetta Shanks , who verbally acknowledged these results. Review of the MIP images confirms the above findings  IMPRESSION: 1. 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 2. No embolic source seen in the neck, although limited by significant motion artifact. Electronically Signed   By: Monte Fantasia M.D.   On: 01/03/2018 13:27   Dg Chest Port 1 View  Result Date: 01/03/2018 CLINICAL DATA:  Nausea since first chemotherapy treatment this week, smoker, testicular cancer EXAM: PORTABLE CHEST 1 VIEW COMPARISON:  Portable exam 1140 hours compared to 08/07/2016 FINDINGS: Normal heart size, mediastinal contours, and pulmonary vascularity. Lungs clear. No pleural effusion or pneumothorax. Bones unremarkable. IMPRESSION: Normal exam. Electronically Signed  By: Lavonia Dana M.D.   On: 01/03/2018 11:58    Procedures Procedure Name: Intubation Date/Time: 01/03/2018 2:00 PM Performed by: Charlesetta Shanks, MD Pre-anesthesia Checklist: Patient identified, Patient being monitored, Emergency Drugs available, Timeout performed and Suction available Oxygen Delivery Method: Ambu bag Preoxygenation: Pre-oxygenation with 100% oxygen Induction Type: Rapid sequence Ventilation: Mask ventilation without difficulty Laryngoscope Size: Glidescope and 4 Grade View: Grade I Tube size: 7.5 mm Number of attempts: 1 Placement Confirmation: ETT inserted through vocal cords under direct vision,  CO2 detector and Breath sounds checked- equal and bilateral Secured at: 25 cm Tube secured with: ETT holder Dental Injury: Teeth and Oropharynx as per pre-operative assessment  Comments: Patient intubated under RSI conditions without complication.      (including critical care time) CRITICAL CARE Performed by: Charlesetta Shanks   Total critical care time: 60 minutes  Critical care time was exclusive of separately billable procedures and treating other patients.  Critical care was necessary to treat or prevent imminent or life-threatening deterioration.  Critical care was time spent personally by me on  the following activities: development of treatment plan with patient and/or surrogate as well as nursing, discussions with consultants, evaluation of patient's response to treatment, examination of patient, obtaining history from patient or surrogate, ordering and performing treatments and interventions, ordering and review of laboratory studies, ordering and review of radiographic studies, pulse oximetry and re-evaluation of patient's condition. Medications Ordered in ED Medications  cefTRIAXone (ROCEPHIN) 2 g in sodium chloride 0.9 % 100 mL IVPB (0 g Intravenous Stopped 01/03/18 1212)  LORazepam (ATIVAN) injection 1 mg (1 mg Intravenous Given 01/03/18 1157)  nicardipine (CARDENE) 20mg  in 0.86% saline 244ml IV infusion (0.1 mg/ml) (has no administration in time range)  sodium chloride (PF) 0.9 % injection (has no administration in time range)  iopamidol (ISOVUE-370) 76 % injection (has no administration in time range)  fentaNYL (SUBLIMAZE) injection 200 mcg (has no administration in time range)  succinylcholine (ANECTINE) injection 120 mg (has no administration in time range)  propofol (DIPRIVAN) 1000 MG/100ML infusion (5 mcg/kg/min  66 kg Intravenous New Bag/Given 01/03/18 1358)  LORazepam (ATIVAN) injection 1 mg (1 mg Intravenous Given 01/03/18 1122)  sodium chloride 0.9 % bolus 30 mL/kg (0 mLs Intravenous Stopped 01/03/18 1320)  dexamethasone (DECADRON) injection 10 mg (10 mg Intravenous Given 01/03/18 1141)  vancomycin (VANCOCIN) IVPB 1000 mg/200 mL premix (0 mg Intravenous Stopped 01/03/18 1305)  acyclovir (ZOVIRAX) 660 mg in dextrose 5 % 100 mL IVPB (0 mg Intravenous Stopped 01/03/18 1352)  levETIRAcetam (KEPPRA) IVPB 1000 mg/100 mL premix (0 mg Intravenous Stopped 01/03/18 1210)  LORazepam (ATIVAN) injection 1 mg (1 mg Intravenous Given 01/03/18 1140)  iopamidol (ISOVUE-370) 76 % injection 100 mL (100 mLs Intravenous Contrast Given 01/03/18 1256)  lidocaine (XYLOCAINE) 4 mg/mL bolus via  infusion 100 mg (100 mg Intravenous Bolus from Bag 01/03/18 1350)  etomidate (AMIDATE) injection 19.8 mg (19.8 mg Intravenous Given 01/03/18 1356)  fentaNYL (SUBLIMAZE) injection (200 mcg Intravenous Given 01/03/18 1355)  etomidate (AMIDATE) injection (19.8 mg Intravenous Given 01/03/18 1356)  succinylcholine (ANECTINE) injection (120 mg Intravenous Given 01/03/18 1356)     Initial Impression / Assessment and Plan / ED Course  I have reviewed the triage vital signs and the nursing notes.  Pertinent labs & imaging results that were available during my care of the patient were reviewed by me and considered in my medical decision making (see chart for details).  Clinical Course as of Jan 04 1420  Sat Jan 03, 2018  1121 Consult to neurology and oncology placed.   [MP]  1249 Consult: Reviewed with Dr. Cheral Marker with CT results.  Recommends to proceed with CT angios of the head and the neck.  Will start Cardene for blood pressure control with systolic goal of 322.  Patient will need subsequent MRI with plan to transfer to [facility] emergency department for MRI, ongoing neurology management and admission.   [MP]  [facility] accepts for transfer to ED   [MP]    Clinical Course User Index [MP] Charlesetta Shanks, MD   Patient presents in extremis.  He has known history of testicular cancer that is undergoing treatment which she has been tolerating.  The symptoms apparently started quite acutely.  Patient was last known well last night when he had dinner with family members and all parted ways about 8 PM.  Patient's wife reports she had gone to bed early and then at some time around 4-5 AM noted that he seemed to be moving her groaning.  It did not immediately strike her as a critical issue.  However as she continued to try to attend to him and observe him, she recognized that he was not doing well and called for EMS assistance.  She reports during that time he did not have any intelligible speech.  On  arrival the patient was having some combination of what looked like extreme rigors with with decerebrate posturing and responded to stimulus with decorticate movements of the arms and teeth grinding.  Differential diagnosis included seizure activity, metastatic brain tumors, encephalitis\meningitis, stroke, metabolic derangement, chemotherapy-induced reaction.  Treatment was immediately started for sepsis protocol including acyclovir.  Ativan for possible seizure and sedation.  Consultation immediately placed to Dr. Cheral Marker and Magrinat.  [place] agreed with proceeding with Keppra load.  Magrinat reviewed medications and did not immediately suspect this was related to chemotherapy.  Dr. Cheral Marker came to the emergency department for assessment of the patient.  Presentation was complex.  Proceeded with work-up and identified posterior circulation clot on CTA.  Patient was intubated for airway protection and transport.  His mental status did not change throughout treatment.  He remained obtunded with atypical movements and tremors.  This was somewhat alleviated with Ativan but did not completely resolve.  Uncomplicated RSI intubation for transfer.  Final Clinical Impressions(s) / ED Diagnoses   Final diagnoses:  Cerebrovascular accident (CVA) due to occlusion of cerebral artery ([place])  [place]    ED Discharge Orders    None       Charlesetta Shanks, MD 01/03/18 1429

## 2018-01-03 NOTE — ED Notes (Signed)
[facility] is here; Dr. Johnney Killian is prepping to intubate pt., which will occur shortly. RT and all requisite personnel and precautions are taken. His significant other is with him and is aware of all that is occurring, including impending transfer to [facility].

## 2018-01-03 NOTE — ED Triage Notes (Signed)
He reports feeling much nausea since receiving his first chemo. Treatment this week. He arrives somewhat drowsy in appearance and in no distress.

## 2018-01-03 NOTE — Progress Notes (Signed)
Pt intubated in ER at [facility]. Pt being sent to [facility] for further work up. [facility] here to pick pt up during intubation. Pt on 540 VT/8CC, 15 RR, 5 PEEP, FIO2 100%, 7.5 tube @ 25 teeth. Report given to N. Nelson RRT,RCP.

## 2018-01-03 NOTE — Consult Note (Signed)
NEURO HOSPITALIST CONSULT NOTE   Requesting physician: Dr. Johnney Killian  Reason for Consult: Confusion and dystonic posturing  History obtained from:  Family and Chart     HPI:                                                                                                                                          Caleb Hubbard is an 37 y.o. male with renal insufficiency and testicular cancer with lymph node metastasis who presented to the [facility] ED this morning with AMS, rigors and dystonic posturing. He had started IV chemotherapy with cisplatin and etoposide for his testicular cancer (nonseminomatous germ cell tumor) earlier in the week (received dexamethasone 30 minutes prior to chemo) and had come in for serial infusions, but had to miss his Friday infusion due to severe fatigue. Per family ,   A STAT CT head was obtained, revealing patchy hypodensities in the cerebellum and occipital lobes, concerning for subacute infarctions verses acute PRES. CTA was then obtained, revealing severely stenotic basilar artery concerning for RCVS versus thrombosis.   Lactate was abnormally elevated at 5.7 and he had a mild fever on arrival. WBC was elevated at 15.1. BUN 21 and Cr 1.01. LFTs normal. CK 119 and Troponin < 0.03. TSH normal, glucose 103 and PTT 23 with PT 14.7. EKG showed sinus rhythm with one PVC.   Neurology was called for a STAT evaluation of the patient at [facility] for possible seizure. Exam revealed findings most consistent with an acute brainstem syndrome.   CT head: Patchy low-density in the bilateral cerebellum and occipital lobes. This pattern suggests posterior circulation acute infarcts-consider CTA. Given young age and recent chemotherapy underlying PRES is a consideration-although no symmetric cerebral edema. Metastatic disease with edema is possible but less likely given the distribution.  CTA head and neck: 1. 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 2. No embolic source seen in the neck, although limited by significant motion artifact.  Past Medical History:  Diagnosis Date  . Anxiety   . Cancer ([place])   . Headache   . Hypertension   . Testicular mass     Past Surgical History:  Procedure Laterality Date  . NO PAST SURGERIES    . ORCHIECTOMY Right 06/17/2017   Procedure: RIGHT RADICAL ORCHIECTOMY;  Surgeon: Ceasar Mons, MD;  Location: [facility];  Service: Urology;  Laterality: Right;    No family history on file.            Social History:  reports that he has been smoking cigars. He has never used smokeless tobacco. He reports that he has current or past drug history. Drug: Marijuana. He reports that he does not drink alcohol.  No Known Allergies  HOME MEDICATIONS:  Prior to Admission:  Current Meds  Medication Sig  . amLODipine (NORVASC) 10 MG tablet Take 10 mg by mouth daily.  . chlorthalidone (HYGROTON) 25 MG tablet Take 25 mg by mouth daily.  . cloNIDine (CATAPRES) 0.2 MG tablet Take 0.2 mg by mouth 2 (two) times daily.     ROS:                                                                                                                                       Unable to obtain from patient due to obtundation. Family states that he has had a headache recently.   Blood pressure (!) 197/94, pulse 90, temperature (!) 100.4 F (38 C), temperature source Rectal, resp. rate 16, weight 66 kg, SpO2 100 %.   General Examination:                                                                                                       Physical Exam  HEENT-  [place]/AT   Lungs- Respirations unlabored Extremities- Warm and well perfused with no edema  Neurological Examination Mental Status: Obtunded with no eye opening to any stimuli. No response to any commands. Extensor posturing of BUE and BLE  alternating with flexor posturing and flailing movement of upper extremities. Does not localize to sternal rub.  Cranial Nerves: II: No blink to threat bilaterally. Pupils equally reactive 3 mm >> 2 mm  III,IV, VI: Roving EOM without forced gaze deviation. No nystagmus.  VII: Face is symmetric with occasional weak grimace to noxious.  VIII: No response to voice IX,X: Unable to visualize palate/uvula XI: Intermittent movement of shoulders in conjunction with limb movements described below XII: Unable to assess Motor: Increased extensor tone BUE and BLE which fluctuates. At times also with increased flexor tone alternately of upper extremities as well as intermittent flaccidity of upper extremities. Exhibits intermittent worsening of dystonic posturing of upper extremities as well as occasional asymmetric choreaform movements of upper extremities, neck and shoulders. Hands clenched with fisted thumbs bilaterally as well as flexion at wrists bilaterally. The movements are exacerbated by sternal rub and pinching of limbs.  Sensory: As above to noxious Deep Tendon Reflexes: Pathologically brisk reflexes throughout. Sustained clonus of ankles on forced dorsiflexion of feet bilaterally. Plantars: Toes tonically plantar flexed, but are briskly upgoing with [place].  Cerebellar/Gait: Unable to assess   Lab Results: Basic Metabolic Panel: Recent Labs  Lab 01/03/18 1120  NA 140  K 3.4*  CL 100  CO2 26  GLUCOSE 103*  BUN 21*  CREATININE 1.01  CALCIUM 9.7  MG 1.8  PHOS 2.9    CBC: Recent Labs  Lab 01/03/18 1120  WBC 15.1*  NEUTROABS 13.1*  HGB 12.0*  HCT 35.7*  MCV 96.5  PLT 298    Cardiac Enzymes: Recent Labs  Lab 01/03/18 1120 01/03/18 1206  CKTOTAL 119  --   TROPONINI  --  <0.03    Lipid Panel: No results for input(s): CHOL, TRIG, HDL, CHOLHDL, VLDL, LDLCALC in the last 168 hours.  Imaging: Ct Angio Head W Or Wo Contrast  Result Date: 01/03/2018 CLINICAL DATA:   Altered mental status. EXAM: CT ANGIOGRAPHY HEAD AND NECK TECHNIQUE: Multidetector CT imaging of the head and neck was performed using the standard protocol during bolus administration of intravenous contrast. Multiplanar CT image reconstructions and MIPs were obtained to evaluate the vascular anatomy. Carotid stenosis measurements (when applicable) are obtained utilizing NASCET criteria, using the distal internal carotid diameter as the denominator. CONTRAST:  185mL ISOVUE-370 IOPAMIDOL (ISOVUE-370) INJECTION 76% COMPARISON:  Noncontrast head CT earlier today FINDINGS: CTA NECK FINDINGS Aortic arch: Normal.  Three vessel branching. Right carotid system: Limited by motion but no stenosis, ulceration, or dissection. Left carotid system: Limited by motion but no stenosis, ulceration, or dissection. There is mild atherosclerotic plaque at the ICA bulb. Vertebral arteries: No proximal subclavian stenosis or atherosclerosis. Both vertebral arteries are motion degraded but where not distorted they are widely patent. Skeleton: Accounting for motion there is no acute finding Other neck: Nasal trumpet in place. Upper chest: Negative Review of the MIP images confirms the above findings CTA HEAD FINDINGS Anterior circulation: Limited by motion. No major branch occlusion flow limiting stenosis is seen. No gross aneurysm. Posterior circulation: Mild right vertebral artery dominance. Occluded basilar at the vertebrobasilar junction with reconstitution at the terminus. Venous sinuses: Hypoplastic on the right. The dural venous sinuses are patent. Anatomic variants: Negative for aneurysm. Delayed phase: Patient's acute infarcts are nonenhancing. Critical Value/emergent results were called by telephone at the time of interpretation on 01/03/2018 at 1:20 pm to Dr. Charlesetta Shanks , who verbally acknowledged these results. Review of the MIP images confirms the above findings IMPRESSION: 1. 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 2. No embolic source seen in the neck, although limited by significant motion artifact. Electronically Signed   By: Monte Fantasia M.D.   On: 01/03/2018 13:27   Ct Head Wo Contrast  Result Date: 01/03/2018 CLINICAL DATA:  Altered level of consciousness. Seizure-like activity EXAM: CT HEAD WITHOUT CONTRAST TECHNIQUE: Contiguous axial images were obtained from the base of the skull through the vertex without intravenous contrast. COMPARISON:  None. FINDINGS: Brain: Patchy low-density in the bilateral cerebellum and occipital lobes. No low-density seen in the anterior circulation distribution. No hemorrhage, hydrocephalus, or mass effect. Vascular: No hyperdense vessel, including the basilar Skull: Negative Sinuses/Orbits: Negative Other: Critical Value/emergent results were called by telephone at the time of interpretation on 01/03/2018 at 12:43 pm to Dr. Jeannie Done PFEIFFER , who verbally acknowledged these results. IMPRESSION: Patchy low-density in the bilateral cerebellum and occipital lobes. This pattern suggests posterior circulation acute infarcts-consider CTA. Given young age and recent chemotherapy underlying PRES is a consideration-although no symmetric cerebral edema. Metastatic disease with edema is possible but less likely given the distribution. Electronically Signed   By: Monte Fantasia M.D.   On: 01/03/2018 12:45   Ct Angio Neck W And/or Wo Contrast  Result Date: 01/03/2018 CLINICAL  DATA:  Altered mental status. EXAM: CT ANGIOGRAPHY HEAD AND NECK TECHNIQUE: Multidetector CT imaging of the head and neck was performed using the standard protocol during bolus administration of intravenous contrast. Multiplanar CT image reconstructions and MIPs were obtained to evaluate the vascular anatomy. Carotid stenosis measurements (when applicable) are obtained utilizing NASCET criteria, using the distal internal carotid diameter as the denominator. CONTRAST:  171mL  ISOVUE-370 IOPAMIDOL (ISOVUE-370) INJECTION 76% COMPARISON:  Noncontrast head CT earlier today FINDINGS: CTA NECK FINDINGS Aortic arch: Normal.  Three vessel branching. Right carotid system: Limited by motion but no stenosis, ulceration, or dissection. Left carotid system: Limited by motion but no stenosis, ulceration, or dissection. There is mild atherosclerotic plaque at the ICA bulb. Vertebral arteries: No proximal subclavian stenosis or atherosclerosis. Both vertebral arteries are motion degraded but where not distorted they are widely patent. Skeleton: Accounting for motion there is no acute finding Other neck: Nasal trumpet in place. Upper chest: Negative Review of the MIP images confirms the above findings CTA HEAD FINDINGS Anterior circulation: Limited by motion. No major branch occlusion flow limiting stenosis is seen. No gross aneurysm. Posterior circulation: Mild right vertebral artery dominance. Occluded basilar at the vertebrobasilar junction with reconstitution at the terminus. Venous sinuses: Hypoplastic on the right. The dural venous sinuses are patent. Anatomic variants: Negative for aneurysm. Delayed phase: Patient's acute infarcts are nonenhancing. Critical Value/emergent results were called by telephone at the time of interpretation on 01/03/2018 at 1:20 pm to Dr. Charlesetta Shanks , who verbally acknowledged these results. Review of the MIP images confirms the above findings IMPRESSION: 1. 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 2. No embolic source seen in the neck, although limited by significant motion artifact. Electronically Signed   By: Monte Fantasia M.D.   On: 01/03/2018 13:27   Dg Chest Port 1 View  Result Date: 01/03/2018 CLINICAL DATA:  Nausea since first chemotherapy treatment this week, smoker, testicular cancer EXAM: PORTABLE CHEST 1 VIEW COMPARISON:  Portable exam 1140 hours compared to 08/07/2016 FINDINGS: Normal heart size, mediastinal  contours, and pulmonary vascularity. Lungs clear. No pleural effusion or pneumothorax. Bones unremarkable. IMPRESSION: Normal exam. Electronically Signed   By: Lavonia Dana M.D.   On: 01/03/2018 11:58    Assessment: 37 year old male with metastatic testicular cancer, presenting with acute brainstem syndrome symptoms and signs in the context of recently initiated IV chemotherapy with cisplatin and etoposide. CT head reveals subacute cerebellar and occipital lobe ischemic infarctions. CTA reveals severe basilar stenosis as well as stenoses elsewhere, consistent with RCVS versus thrombosis.   1. Exam consistent with severe acute brainstem syndrome, with obtundation, posturing, pathological reflexes and intermittent shivering movements.  2. RCVS is felt to be more likely than in situ thrombosis given lack of stroke risk factors other than cancer. Per the literature, cisplatin is known to have the potential to provoke vasospasm, which might result in symptoms of angina, acute coronary syndrome, and stroke.  Recommendations: 1. STAT transfer from [facility] ED to [facility] ED completed. Now in MRI.  2. STAT MRI with DWI and axial T2 to assess for possible brainstem infarction.  3. VIR team has been called in for emergent angiogram and possible endovascular treatment 4. BP management with nicardipine 5. Vent management. The patient is sedated with propofol 6. Further recommendations following result of MRI brain  65 minutes spent in the emergent neurological evaluation and management of this critically ill patient with acute brainstem syndrome.   Electronically signed: Dr. Randall Hiss  Caelin Rayl 01/03/2018, 1:38 PM

## 2018-01-03 NOTE — ED Notes (Signed)
Thus far he has been non-verbal, but initially was able to answer questions with a nod of his head. He seems to exhibit CNS irritability, and at times postures in what appears to be the beginning of decortication. Dr. Johnney Killian also had [place] place a #7 nasopharyngeal airway in his right nare. His depth and rate of breathing are normal.

## 2018-01-03 NOTE — Procedures (Signed)
S/P 4 vessel cerebral artriogram RT CFA approach. Findings. 1.Occluded basilar artery from RT PICA to  The superior cerebellar arteries. S/P complete revascularization with x 2 passes with embotrap 50mm x 33 mm retriever device  With a TICI 2 b revascularization. Followed by rescue stent at site of severe focal stenosis in the mid basilar artery causing recoil restenosis.Marland Kitchen

## 2018-01-03 NOTE — Consult Note (Signed)
NAMENishanth Mccaughan, MRN:  [id number], DOB:  [birth year], LOS: 0 ADMISSION DATE:  01/03/2018, CONSULTATION DATE:  11/23 REFERRING MD:  Dr. Cheral Marker , CHIEF COMPLAINT:  CVA   Brief History   37 year old male undergoing treatment for testicular cancer admitted 11/23 with brainstem stroke and was left on vent after IR procedure.   History of present illness   Patient is encephalopathic and/or intubated. Therefore history has been obtained from chart review.  37 year old male with PMH as below, which is significant for testicular cancer (nonseminomatous germ cell tumor) and is currently undergoing chemotherapy (cycle one Cisplatin/etoposide started 11/18 and he underwent daily infusions through 11/21). He skipped 11/22 infusion due to profound fatigue. This fatigue progressed to include AMS< rigors, and posturing causing him to present to [facility] ED where CT head was done. CT demonstrated concern for infacrtion of the cerebellum and occipital lobes. CTA demonstrated stenotic basilar artery. He then had seizre like episode and was transferred to [facility] for neurology evaluation, who's exam was concerning for acute brainstem syndrome. The patient was taken to IR for revascularization and remained on vent post-operatively at which time he was transferred to ICU. PCCM consulted.   Past Medical History  Testicular Ca, hypertension  Significant Hospital Events   11/23 admit for brainstem stroke. IR revascularization. On vent.   Consults:    Procedures:  11/23 IR cerebral arteriogram with revascularization.  ETT 11/23 >  Significant Diagnostic Tests:  CT head 11/23 > Patchy low-density in the bilateral cerebellum and occipital lobes. This pattern suggests posterior circulation acute infarcts-consider CTA. Given young age and recent chemotherapy underlying PRES is a consideration-although no symmetric cerebral edema. Metastatic disease with edema is possible but less likely given the distribution. 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 2. No embolic source seen in the neck, although limited by significant motion artifact.  Micro Data:  Blood 11/23 >  Antimicrobials:  Periop ceftriaxone  Interim history/subjective:    Objective   Blood pressure 121/84, pulse 95, temperature 98.1 F (36.7 C), temperature source Axillary, resp. rate 15, height 5\' 11"  (1.803 m), weight 67.1 kg, SpO2 100 %.    Vent Mode: PRVC FiO2 (%):  [100 %] 100 % Set Rate:  [15 bmp] 15 bmp Vt Set:  [540 mL-600 mL] 600 mL PEEP:  [5 cmH20] 5 cmH20 Plateau Pressure:  [20 cmH20] 20 cmH20   Intake/Output Summary (Last 24 hours) at 01/03/2018 2225 Last data filed at 01/03/2018 2200 Gross per 24 hour  Intake 1075.28 ml  Output 2450 ml  Net -1374.72 ml   Filed Weights   01/03/18 1300 01/03/18 1845  Weight: 66 kg 67.1 kg    Examination: General: young adult male on vent HENT: [place]/AT, PERRL, no JVD Lungs: Clear bilateral breath sounds Cardiovascular: RRR, no MRG Abdomen: Soft, non-distended Extremities: No acute deformity  Neuro: Sedated RASS -3.   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 S/p IR revascularization 11/23. History of hypertension - Management per neurology, IR - SBP goal 120-140 mmHg - Clevidipine for BP goal - Oral antihypertensive agents initiated.  - Neuro checks per protocol.   Inability to protect airway in the setting of CVA - Full vent support  - ABG - VAP bundle - Propofol infusion and PRN fentanyl for RASS goal -1 to -2  Testicular Cancer  with metastasis to lymph - Followed at [facility]  CKD - Follow BMP  Hypokalemia - Repeat BMP  Best practice:  Diet: NPO Pain/Anxiety/Delirium protocol (if indicated): Propofol per protocol VAP protocol (if indicated): Per protocol DVT prophylaxis: SCDs for now, will defer  chemoprophylaxis to primary team.  GI prophylaxis: Pepcid Glucose control: n/a Mobility: BR Code Status: Full Family Communication: updated mother Disposition: ICU  Labs   CBC: Recent Labs  Lab 01/03/18 1120  WBC 15.1*  NEUTROABS 13.1*  HGB 12.0*  HCT 35.7*  MCV 96.5  PLT 160    Basic Metabolic Panel: Recent Labs  Lab 01/03/18 1120  NA 140  K 3.4*  CL 100  CO2 26  GLUCOSE 103*  BUN 21*  CREATININE 1.01  CALCIUM 9.7  MG 1.8  PHOS 2.9   GFR: Estimated Creatinine Clearance: 96 mL/min (by C-G formula based on SCr of 1.01 mg/dL). Recent Labs  Lab 01/03/18 1119 01/03/18 1120 01/03/18 1332  PROCALCITON  --  <0.10  --   WBC  --  15.1*  --   LATICACIDVEN 5.70*  --  3.63*    Liver Function Tests: Recent Labs  Lab 01/03/18 1120  AST 20  ALT 12  ALKPHOS 63  BILITOT 1.0  PROT 7.1  ALBUMIN 4.0   Recent Labs  Lab 01/03/18 1120  LIPASE 22   No results for input(s): AMMONIA in the last 168 hours.  ABG    Component Value Date/Time   HCO3 22.9 01/03/2018 1133   ACIDBASEDEF 0.3 01/03/2018 1133   O2SAT 94.5 01/03/2018 1133     Coagulation Profile: Recent Labs  Lab 01/03/18 1120  INR 1.16    Cardiac Enzymes: Recent Labs  Lab 01/03/18 1120 01/03/18 1206  CKTOTAL 119  --   TROPONINI  --  <0.03    HbA1C: No results found for: HGBA1C  CBG: Recent Labs  Lab 01/03/18 1124  GLUCAP 111*    Review of Systems:     Past Medical History  He,  has a past medical history of Anxiety, Cancer ([place]), Headache, Hypertension, and Testicular mass.   Surgical History    Past Surgical History:  Procedure Laterality Date  . NO PAST SURGERIES    . ORCHIECTOMY Right 06/17/2017   Procedure: RIGHT RADICAL ORCHIECTOMY;  Surgeon: Ceasar Mons, MD;  Location: [facility];  Service: Urology;  Laterality: Right;     Social History   reports that he has been smoking cigars. He has never used smokeless tobacco. He reports that he has current or past  drug history. Drug: Marijuana. He reports that he does not drink alcohol.   Family History   His family history is not on file.   Allergies No Known Allergies   Home Medications  Prior to Admission medications   Medication Sig Start Date End Date Taking? Authorizing Provider  amLODipine (NORVASC) 10 MG tablet Take 10 mg by mouth daily. 12/29/17 12/29/18 Yes [provider]  chlorthalidone (HYGROTON) 25 MG tablet Take 25 mg by mouth daily. 11/20/17  Yes [provider]  cloNIDine (CATAPRES) 0.2 MG tablet Take 0.2 mg by mouth 2 (two) times daily.   Yes [provider]  dexamethasone (DECADRON) 4 MG tablet Take 4 mg by mouth See admin instructions. Take 2 tablets by mouth once daily for 3 days start the day after chemotherapy and continue for 3 days 11/26/17   [provider]  OLANZapine (ZYPREXA) 10 MG tablet Take 10 mg by mouth See admin instructions. Take  1 tablet by mouth nightly for 3 doses start the day after chemotherapy and continue for 3 days 11/26/17   [provider]  ondansetron (ZOFRAN) 8 MG tablet Take 8 mg by mouth every 8 (eight) hours as needed for nausea/vomiting. 11/26/17   [provider]  prochlorperazine (COMPAZINE) 10 MG tablet Take 10 mg by mouth every 6 (six) hours as needed for nausea/vomiting. 11/26/17   [provider]     Critical care time: 71 min     Georgann Housekeeper, AGACNP-BC [place] Pager [phone] or [phone]  01/03/2018 10:50 PM

## 2018-01-03 NOTE — ED Notes (Signed)
Received pt from [place] from [facility] ED = pt is intubated, OG present, condom cath in place, propofol infusing at 58mcg/kg/m- increased to 20 mcg/m  Dr Cheral Marker aware of pt's arrival.

## 2018-01-04 ENCOUNTER — Inpatient Hospital Stay (HOSPITAL_COMMUNITY): Payer: Medicaid Other

## 2018-01-04 DIAGNOSIS — C6211 Malignant neoplasm of descended right testis: Secondary | ICD-10-CM

## 2018-01-04 DIAGNOSIS — I6322 Cerebral infarction due to unspecified occlusion or stenosis of basilar arteries: Principal | ICD-10-CM

## 2018-01-04 DIAGNOSIS — Z9221 Personal history of antineoplastic chemotherapy: Secondary | ICD-10-CM

## 2018-01-04 DIAGNOSIS — F419 Anxiety disorder, unspecified: Secondary | ICD-10-CM

## 2018-01-04 DIAGNOSIS — G40909 Epilepsy, unspecified, not intractable, without status epilepticus: Secondary | ICD-10-CM

## 2018-01-04 DIAGNOSIS — Z9079 Acquired absence of other genital organ(s): Secondary | ICD-10-CM

## 2018-01-04 DIAGNOSIS — N5089 Other specified disorders of the male genital organs: Secondary | ICD-10-CM

## 2018-01-04 DIAGNOSIS — E782 Mixed hyperlipidemia: Secondary | ICD-10-CM

## 2018-01-04 DIAGNOSIS — I351 Nonrheumatic aortic (valve) insufficiency: Secondary | ICD-10-CM

## 2018-01-04 DIAGNOSIS — I10 Essential (primary) hypertension: Secondary | ICD-10-CM

## 2018-01-04 DIAGNOSIS — F17210 Nicotine dependence, cigarettes, uncomplicated: Secondary | ICD-10-CM

## 2018-01-04 DIAGNOSIS — I639 Cerebral infarction, unspecified: Secondary | ICD-10-CM

## 2018-01-04 DIAGNOSIS — Z79899 Other long term (current) drug therapy: Secondary | ICD-10-CM

## 2018-01-04 DIAGNOSIS — C779 Secondary and unspecified malignant neoplasm of lymph node, unspecified: Secondary | ICD-10-CM

## 2018-01-04 DIAGNOSIS — Z978 Presence of other specified devices: Secondary | ICD-10-CM

## 2018-01-04 LAB — LIPID PANEL
Cholesterol: 174 mg/dL (ref 0–200)
LDL CALC: UNDETERMINED mg/dL (ref 0–99)
Triglycerides: 1966 mg/dL — ABNORMAL HIGH (ref <150)
VLDL: UNDETERMINED mg/dL (ref 0–40)

## 2018-01-04 LAB — BASIC METABOLIC PANEL
ANION GAP: 9 (ref 5–15)
BUN: 17 mg/dL (ref 6–20)
CALCIUM: 8.2 mg/dL — AB (ref 8.9–10.3)
CO2: 22 mmol/L (ref 22–32)
Chloride: 101 mmol/L (ref 98–111)
Creatinine, Ser: 0.5 mg/dL — ABNORMAL LOW (ref 0.61–1.24)
Glucose, Bld: 113 mg/dL — ABNORMAL HIGH (ref 70–99)
Potassium: 3.8 mmol/L (ref 3.5–5.1)
Sodium: 132 mmol/L — ABNORMAL LOW (ref 135–145)

## 2018-01-04 LAB — RAPID URINE DRUG SCREEN, HOSP PERFORMED
Amphetamines: NOT DETECTED
BARBITURATES: NOT DETECTED
Benzodiazepines: NOT DETECTED
COCAINE: NOT DETECTED
Opiates: NOT DETECTED
TETRAHYDROCANNABINOL: POSITIVE — AB

## 2018-01-04 LAB — CBC WITH DIFFERENTIAL/PLATELET
Abs Immature Granulocytes: 0.04 10*3/uL (ref 0.00–0.07)
BASOS ABS: 0 10*3/uL (ref 0.0–0.1)
Basophils Relative: 0 %
EOS ABS: 0 10*3/uL (ref 0.0–0.5)
Eosinophils Relative: 0 %
HEMATOCRIT: 54.2 % — AB (ref 39.0–52.0)
Hemoglobin: 19.1 g/dL — ABNORMAL HIGH (ref 13.0–17.0)
IMMATURE GRANULOCYTES: 1 %
Lymphocytes Relative: 6 %
Lymphs Abs: 0.2 10*3/uL — ABNORMAL LOW (ref 0.7–4.0)
MCH: 33.1 pg (ref 26.0–34.0)
MCHC: 35.2 g/dL (ref 30.0–36.0)
MCV: 93.9 fL (ref 80.0–100.0)
Monocytes Absolute: 0 10*3/uL — ABNORMAL LOW (ref 0.1–1.0)
Monocytes Relative: 1 %
NEUTROS PCT: 92 %
NRBC: 0 % (ref 0.0–0.2)
Neutro Abs: 3.3 10*3/uL (ref 1.7–7.7)
PLATELETS: 137 10*3/uL — AB (ref 150–400)
RBC: 5.77 MIL/uL (ref 4.22–5.81)
RDW: 15.6 % — AB (ref 11.5–15.5)
WBC: 3.6 10*3/uL — ABNORMAL LOW (ref 4.0–10.5)

## 2018-01-04 LAB — HEMOGLOBIN A1C
Hgb A1c MFr Bld: 6.4 % — ABNORMAL HIGH (ref 4.8–5.6)
Mean Plasma Glucose: 136.98 mg/dL

## 2018-01-04 LAB — HIV ANTIBODY (ROUTINE TESTING W REFLEX): HIV Screen 4th Generation wRfx: NONREACTIVE

## 2018-01-04 MED ORDER — FENTANYL 2500MCG IN NS 250ML (10MCG/ML) PREMIX INFUSION
0.0000 ug/h | INTRAVENOUS | Status: DC
  Administered 2018-01-04: 25 ug/h via INTRAVENOUS
  Administered 2018-01-05: 50 ug/h via INTRAVENOUS
  Filled 2018-01-04 (×2): qty 250

## 2018-01-04 MED ORDER — HEPARIN SODIUM (PORCINE) 5000 UNIT/ML IJ SOLN
5000.0000 [IU] | Freq: Three times a day (TID) | INTRAMUSCULAR | Status: DC
  Administered 2018-01-04 – 2018-01-14 (×30): 5000 [IU] via SUBCUTANEOUS
  Filled 2018-01-04 (×29): qty 1

## 2018-01-04 MED ORDER — CLEVIDIPINE BUTYRATE 0.5 MG/ML IV EMUL
0.0000 mg/h | INTRAVENOUS | Status: DC
  Administered 2018-01-04 (×4): 30 mg/h via INTRAVENOUS
  Administered 2018-01-05: 20 mg/h via INTRAVENOUS
  Administered 2018-01-05: 30 mg/h via INTRAVENOUS
  Administered 2018-01-05: 25 mg/h via INTRAVENOUS
  Filled 2018-01-04 (×2): qty 50
  Filled 2018-01-04 (×2): qty 100
  Filled 2018-01-04: qty 50

## 2018-01-04 NOTE — Progress Notes (Signed)
ABG results on ISTAT are not crossing over to pt's chart.   ABG collected @ 01:22 with vent settings: PRVC/600/15/60%/+5  7.49 32.7 285 25.3 100%

## 2018-01-04 NOTE — Progress Notes (Signed)
PT Cancellation Note  Patient Details Name: Caleb Hubbard MRN: [id number] DOB: 03-22-1980   Cancelled Treatment:    Reason Eval/Treat Not Completed: Patient not medically ready;Active bedrest order. Pt remains intubated, very lethargic, and continues to have active bed rest order. PT to return as able, as appropriate to complete PT eval.   [name], PT, DPT Acute Rehabilitation Services Pager #: [phone] Office #: [phone]    [name] 01/04/2018, 9:53 AM

## 2018-01-04 NOTE — Progress Notes (Signed)
SLP Cancellation Note  Patient Details Name: Keishawn Rajewski MRN: [id number] DOB: [birth year]   Cancelled treatment:       Reason Eval/Treat Not Completed: Patient not medically ready - intubated, sedated. Will f/u as able.   Germain Osgood 01/04/2018, 7:23 AM  Germain Osgood, M.A. [place] Acute Environmental education officer [phone] Office [phone]

## 2018-01-04 NOTE — Progress Notes (Signed)
[facility] Progress Note Patient Name: Caleb Hubbard DOB: 06/11/1980 MRN: [id number]   Date of Service  01/04/2018  HPI/Events of Note  Fentanyl drip added for patient comfort which I believe will also help meet bp goals  eICU Interventions       Intervention Category Intermediate Interventions: Pain - evaluation and management  Mauri [place], P 01/04/2018, 9:32 PM

## 2018-01-04 NOTE — Progress Notes (Signed)
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 S/P complete revascularization with x 2 passes with embotrap 19mm x 33 mm retriever device  With a TICI 2 b revascularization. Followed by rescue stent at site of severe focal stenosis in the mid basilar artery causing recoil restenosis  Vent; sedated-- no response   Allergies: Patient has no known allergies.  Medications: Prior to Admission medications   Medication Sig Start Date End Date Taking? Authorizing Provider  amLODipine (NORVASC) 10 MG tablet Take 10 mg by mouth daily. 12/29/17 12/29/18 Yes [provider]  chlorthalidone (HYGROTON) 25 MG tablet Take 25 mg by mouth daily. 11/20/17  Yes [provider]  cloNIDine (CATAPRES) 0.2 MG tablet Take 0.2 mg by mouth 2 (two) times daily.   Yes [provider]  dexamethasone (DECADRON) 4 MG tablet Take 4 mg by mouth See admin instructions. Take 2 tablets by mouth once daily for 3 days start the day after chemotherapy and continue for 3 days 11/26/17   [provider]  OLANZapine (ZYPREXA) 10 MG tablet Take 10 mg by mouth See admin instructions. Take 1 tablet by mouth nightly for 3 doses start the day after chemotherapy and continue for 3 days 11/26/17   [provider]  ondansetron (ZOFRAN) 8 MG tablet Take 8 mg by mouth every 8 (eight) hours as needed for nausea/vomiting. 11/26/17   [provider]  prochlorperazine (COMPAZINE) 10 MG tablet Take 10 mg by mouth every 6 (six) hours as needed for nausea/vomiting. 11/26/17   [provider]     Vital Signs: BP 103/73   Pulse 79   Temp 99.2 F (37.3 C) (Axillary)   Resp 15   Ht 5\' 11"  (1.803 m)   Wt 147 lb 14.9 oz (67.1 kg)   SpO2 100%   BMI 20.63 kg/m    Physical Exam  Skin: Skin is warm and dry.  Rt groin NT no bleeding no hematoma Left groin NT no bleeding no hematoma Bilat feet 2+ pulses    Vitals reviewed.   Imaging: Ct Angio Head W Or Wo Contrast  Result Date: 01/03/2018 CLINICAL DATA:  Altered mental status. EXAM: CT ANGIOGRAPHY HEAD AND NECK TECHNIQUE: Multidetector CT imaging of the head and neck was performed using the standard protocol during bolus administration of intravenous contrast. Multiplanar CT image reconstructions and MIPs were obtained to evaluate the vascular anatomy. Carotid stenosis measurements (when applicable) are obtained utilizing NASCET criteria, using the distal internal carotid diameter as the denominator. CONTRAST:  141mL ISOVUE-370 IOPAMIDOL (ISOVUE-370) INJECTION 76% COMPARISON:  Noncontrast head CT earlier today FINDINGS: CTA NECK FINDINGS Aortic arch: Normal.  Three vessel branching. Right carotid system: Limited by motion but no stenosis, ulceration, or dissection. Left carotid system: Limited by motion but no stenosis, ulceration, or dissection. There is mild atherosclerotic plaque at the ICA bulb. Vertebral arteries: No proximal subclavian stenosis or atherosclerosis. Both vertebral arteries are motion degraded but where not distorted they are widely patent. Skeleton: Accounting for motion there is no acute finding Other neck: Nasal trumpet in place. Upper chest: Negative Review of the MIP images confirms the above findings CTA HEAD FINDINGS Anterior circulation: Limited by motion. No major branch occlusion flow limiting stenosis is seen. No gross aneurysm. Posterior circulation: Mild right vertebral artery dominance. Occluded  basilar at the vertebrobasilar junction with reconstitution at the terminus. Venous sinuses: Hypoplastic on the right. The dural venous sinuses are patent. Anatomic variants: Negative for aneurysm. Delayed phase: Patient's acute infarcts are nonenhancing. Critical Value/emergent results  were called by telephone at the time of interpretation on 01/03/2018 at 1:20 pm to Dr. Charlesetta Shanks , who verbally acknowledged these results. Review of the MIP images confirms the above findings IMPRESSION: 1. 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 2. No embolic source seen in the neck, although limited by significant motion artifact. Electronically Signed   By: Monte Fantasia M.D.   On: 01/03/2018 13:27   Ct Head Wo Contrast  Result Date: 01/03/2018 CLINICAL DATA:  Altered level of consciousness. Seizure-like activity EXAM: CT HEAD WITHOUT CONTRAST TECHNIQUE: Contiguous axial images were obtained from the base of the skull through the vertex without intravenous contrast. COMPARISON:  None. FINDINGS: Brain: Patchy low-density in the bilateral cerebellum and occipital lobes. No low-density seen in the anterior circulation distribution. No hemorrhage, hydrocephalus, or mass effect. Vascular: No hyperdense vessel, including the basilar Skull: Negative Sinuses/Orbits: Negative Other: Critical Value/emergent results were called by telephone at the time of interpretation on 01/03/2018 at 12:43 pm to Dr. Jeannie Done PFEIFFER , who verbally acknowledged these results. IMPRESSION: Patchy low-density in the bilateral cerebellum and occipital lobes. This pattern suggests posterior circulation acute infarcts-consider CTA. Given young age and recent chemotherapy underlying PRES is a consideration-although no symmetric cerebral edema. Metastatic disease with edema is possible but less likely given the distribution. Electronically Signed   By: Monte Fantasia M.D.   On: 01/03/2018 12:45   Ct Angio Neck W And/or Wo Contrast  Result Date: 01/03/2018 CLINICAL DATA:  Altered mental status. EXAM: CT ANGIOGRAPHY HEAD AND NECK TECHNIQUE: Multidetector CT imaging of the head and neck was performed using the standard protocol during bolus administration of intravenous contrast. Multiplanar CT  image reconstructions and MIPs were obtained to evaluate the vascular anatomy. Carotid stenosis measurements (when applicable) are obtained utilizing NASCET criteria, using the distal internal carotid diameter as the denominator. CONTRAST:  122mL ISOVUE-370 IOPAMIDOL (ISOVUE-370) INJECTION 76% COMPARISON:  Noncontrast head CT earlier today FINDINGS: CTA NECK FINDINGS Aortic arch: Normal.  Three vessel branching. Right carotid system: Limited by motion but no stenosis, ulceration, or dissection. Left carotid system: Limited by motion but no stenosis, ulceration, or dissection. There is mild atherosclerotic plaque at the ICA bulb. Vertebral arteries: No proximal subclavian stenosis or atherosclerosis. Both vertebral arteries are motion degraded but where not distorted they are widely patent. Skeleton: Accounting for motion there is no acute finding Other neck: Nasal trumpet in place. Upper chest: Negative Review of the MIP images confirms the above findings CTA HEAD FINDINGS Anterior circulation: Limited by motion. No major branch occlusion flow limiting stenosis is seen. No gross aneurysm. Posterior circulation: Mild right vertebral artery dominance. Occluded basilar at the vertebrobasilar junction with reconstitution at the terminus. Venous sinuses: Hypoplastic on the right. The dural venous sinuses are patent. Anatomic variants: Negative for aneurysm. Delayed phase: Patient's acute infarcts are nonenhancing. Critical Value/emergent results were called by telephone at the time of interpretation on 01/03/2018 at 1:20 pm to Dr. Charlesetta Shanks , who verbally acknowledged these results. Review of the MIP images confirms the above findings IMPRESSION: 1. 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 2. No embolic source seen in the neck, although limited by significant motion artifact. Electronically Signed   By: Monte Fantasia M.D.   On:  01/03/2018 13:27   Mr Brain Wo Contrast  Result  Date: 01/03/2018 CLINICAL DATA:  Follow-up basilar artery thrombosis and stroke. History of testicular cancer. EXAM: MRI HEAD WITHOUT CONTRAST TECHNIQUE: Axial diffusion weighted imaging, axial T2 FLAIR and axial T2. COMPARISON:  CT HEAD and CT angiogram head and neck January 03, 2018 FINDINGS: Brain: Patchy to confluent reduced diffusion bilateral cerebellum, confluent reduced diffusion within the lower pons. Confluent LEFT greater than RIGHT reduced diffusion bilateral occipital lobes, patchy thalami and RIGHT splenium of corpus callosum reduced diffusion. Subcentimeter areas of reduced diffusion bilateral posterior temporal lobes. All areas of reduced diffusion demonstrate low ADC values with mild T2 hyperintense cytotoxic edema. Patent fourth ventricle, no hydrocephalus. No midline shift. No abnormal extra-axial fluid collections. Vascular: Intermediate signal vertebral arteries and basilar artery. Skull and upper cervical spine: Not tailored for evaluation, non suspicious. Sinuses/Orbits: Mild paranasal sinus mucosal thickening. RIGHT mastoid effusion. Other: None. IMPRESSION: 1. Acute numerous supra- and infratentorial posterior circulation nonhemorrhagic infarcts. 2. 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 3. Critical Value/emergent results text paged to Dr.ERIC [facility] via [employer] secure system on 01/03/2018 at 3:50 pm, including interpreting physician's phone number. Electronically Signed   By: Elon Alas M.D.   On: 01/03/2018 16:01   Dg Chest Port 1 View  Result Date: 01/03/2018 CLINICAL DATA:  Acute basilar artery thrombus with resulting cerebellar, occipital, and brainstem infarcts. Basilar stent placement. EXAM: PORTABLE CHEST 1 VIEW COMPARISON:  01/03/2018 FINDINGS: The endotracheal tube is satisfactorily position with tip 4.5 cm above the carina. Nasogastric tube enters the stomach with side port in the stomach body. The lungs appear clear. Heart size within normal  limits. Mediastinal contours normal. IMPRESSION: 1. Endotracheal and nasogastric tubes appear satisfactorily position. The lungs appear clear. Electronically Signed   By: Van Clines M.D.   On: 01/03/2018 19:35   Dg Chest Portable 1 View  Result Date: 01/03/2018 CLINICAL DATA:  Intubation, history testicular cancer, smoker, hypertension EXAM: PORTABLE CHEST 1 VIEW COMPARISON:  Portable exam 1354 hours compared to 1140 hours FINDINGS: Endotracheal tube with tip projecting 19 mm above carina. Nasogastric tube extends into stomach. Normal heart size, mediastinal contours, and pulmonary vascularity. Tips of lung apices excluded. Visualized lungs clear. No infiltrate, pleural effusion or gross pneumothorax. IMPRESSION: Tube positions as above. No acute abnormalities. Electronically Signed   By: Lavonia Dana M.D.   On: 01/03/2018 14:27   Dg Chest Port 1 View  Result Date: 01/03/2018 CLINICAL DATA:  Nausea since first chemotherapy treatment this week, smoker, testicular cancer EXAM: PORTABLE CHEST 1 VIEW COMPARISON:  Portable exam 1140 hours compared to 08/07/2016 FINDINGS: Normal heart size, mediastinal contours, and pulmonary vascularity. Lungs clear. No pleural effusion or pneumothorax. Bones unremarkable. IMPRESSION: Normal exam. Electronically Signed   By: Lavonia Dana M.D.   On: 01/03/2018 11:58    Labs:  CBC: Recent Labs    05/15/17 1019 05/27/17 1017 01/03/18 1120 01/04/18 0346  WBC 13.0* 15.7* 15.1* 3.6*  HGB 15.1 14.0 12.0* 19.1*  HCT 44.3 40.5 35.7* 54.2*  PLT 348 328 298 137*    COAGS: Recent Labs    01/03/18 1120  INR 1.16  APTT 23*    BMP: Recent Labs    05/15/17 1019 05/27/17 1017 01/03/18 1120 01/04/18 0346  NA 138 139 140 132*  K 3.9 3.5 3.4* 3.8  CL 104 105 100 101  CO2 25 23 26 22   GLUCOSE 123* 120* 103* 113*  BUN 9 9 21* 17  CALCIUM  9.9 9.4 9.7 8.2*  CREATININE 0.96 0.90 1.01 0.50*  GFRNONAA >60 >60 >60 >60  GFRAA >60 >60 >60 >60    LIVER  FUNCTION TESTS: Recent Labs    05/15/17 1019 01/03/18 1120  BILITOT 0.7 1.0  AST 17 20  ALT 10* 12  ALKPHOS 109 63  PROT 8.2* 7.1  ALBUMIN 4.4 4.0    Assessment and Plan:  CVA Basilar artery stenosis Revascularization and rescue stent placed Will follow  Electronically Signed: Kasean Denherder A, PA-C 01/04/2018, 10:04 AM   I spent a total of 15 Minutes at the the patient's bedside AND on the patient's hospital floor or unit, greater than 50% of which was counseling/coordinating care for CVA; basilar artery stenosis

## 2018-01-04 NOTE — Consult Note (Signed)
[place]  [phone] [phone]     ID: Zeshan Plamondon DOB: 08-20-1980  MR#: [id number]  WCH#:[id number]  Patient Care Team: Care, Jinny Blossom [facility] as PCP - General (Family Medicine) Chauncey Cruel, MD OTHER MD:  CHIEF COMPLAINT: non-seminomatous testicular carcinoma  CURRENT TREATMENT: cisplatin, etoposide   HISTORY OF CURRENT ILLNESS: Per [facility] records: Mr Boyajian presented with Right testicular pain April 2019. Ultrasound showed a 4.0 Right testicular mass. 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 Post operative markers were negative and staging studies showed no evidence of spread.  He underwent retroperitoneal lymph node dissection 11/02/2017, positive for a metastatic deposit in one lymph node measuring 3.8 cm; post-op quantitative HCG was WNL         Component Name 12/29/2017 12/01/2017 11/20/2017 10/20/2017   <5.0 <5.0 <5.0 <5.0  hCG Quant,    Initial chemo plans were delayed because of concerns regarding HTN, treated with amlodipine and clonidine.   He received the first of 2 planned cycles of cisplatin and VP-16 on 12/29/2017.  The patient's subsequent history is as detailed below.  The patient then presented to the [facility]/02/2018 with seizures. Initial workup suggested PRES but further workup with CT angio head and neck and non contrast MRI found 1. Acute numerous supra- and infratentorial posterior circulation nonhemorrhagic infarcts. 2. 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  On 01/03/2018 he underwent complete revascularization with x 2 passes with embotrap 104m x 33 mm retriever device  With a TICI 2 b revascularization. 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  We were consulted because of the history of germ  cell tumor  INTERVAL HISTORY: I evaluated the patient in his hospital room with significant other, sister and brother present, mother on speaker phone   REVIEW OF SYSTEMS: Unable to obtain from patient. Family is concerned that he has had some upper body jerking this AM.  PAST MEDICAL HISTORY: Past Medical History:  Diagnosis Date  . Anxiety   . Cancer ([place]   . Headache   . Hypertension   . Testicular mass     PAST SURGICAL HISTORY: Past Surgical History:  Procedure Laterality Date  . NO PAST SURGERIES    . ORCHIECTOMY Right 06/17/2017   Procedure: RIGHT RADICAL ORCHIECTOMY;  Surgeon: WCeasar Mons MD;  Location: [facility];  Service: Urology;  Laterality: Right;    FAMILY HISTORY History reviewed. No pertinent family history.  HEALTH MAINTENANCE: Social History   Tobacco Use  . Smoking status: Current Every Day Smoker    Types: Cigars  . Smokeless tobacco: Never Used  Substance Use Topics  . Alcohol use: No  . Drug use: Yes    Types: Marijuana    Comment: LAST USE MARIJUANA 06-07-17        No Known Allergies  Current Facility-Administered Medications  Medication Dose Route Frequency Provider Last Rate Last Dose  . 0.9 %  sodium chloride infusion   Intravenous Continuous DLuanne Bras MD 75 mL/hr at 01/03/18 1858    . acetaminophen (TYLENOL) tablet 650 mg  650 mg Oral Q4H PRN Deveshwar, SWillaim Rayas MD       Or  . acetaminophen (TYLENOL) solution 650 mg  650 mg Per Tube Q4H PRN DLuanne Bras MD       Or  . acetaminophen (TYLENOL) suppository  650 mg  650 mg Rectal Q4H PRN Deveshwar, Willaim Rayas, MD      . amLODipine (NORVASC) tablet 10 mg  10 mg Per Tube Daily Corey Harold, NP   10 mg at 01/04/18 0800  . aspirin chewable tablet 81 mg  81 mg Oral Daily Deveshwar, Sanjeev, MD       Or  . aspirin chewable tablet 81 mg  81 mg Per Tube Daily Luanne Bras, MD   81 mg at 01/04/18 0802  . chlorhexidine gluconate (MEDLINE KIT) (PERIDEX) 0.12 %  solution 15 mL  15 mL Mouth Rinse BID Deterding, Guadelupe Sabin, MD   15 mL at 01/04/18 0803  . chlorthalidone (HYGROTON) tablet 25 mg  25 mg Per Tube Daily Corey Harold, NP   25 mg at 01/04/18 0802  . clevidipine (CLEVIPREX) infusion 0.5 mg/mL  0-30 mg/hr Intravenous Continuous Deterding, Guadelupe Sabin, MD 60 mL/hr at 01/04/18 1047 30 mg/hr at 01/04/18 1047  . cloNIDine (CATAPRES) tablet 0.2 mg  0.2 mg Per Tube BID Corey Harold, NP   0.2 mg at 01/04/18 0800  . famotidine (PEPCID) IVPB 20 mg premix  20 mg Intravenous Q12H Colbert Coyer, MD 100 mL/hr at 01/04/18 0804 20 mg at 01/04/18 0804  . fentaNYL (SUBLIMAZE) injection 100 mcg  100 mcg Intravenous Q2H PRN Deterding, Guadelupe Sabin, MD   100 mcg at 01/04/18 0814  . heparin injection 5,000 Units  5,000 Units Subcutaneous Q8H Rosalin Hawking, MD      . iohexol (OMNIPAQUE) 300 MG/ML solution 110 mL  110 mL Intravenous Once PRN Deveshwar, Willaim Rayas, MD      . labetalol (NORMODYNE,TRANDATE) injection 10 mg  10 mg Intravenous Q2H PRN Deterding, Guadelupe Sabin, MD   10 mg at 01/03/18 2110  . MEDLINE mouth rinse  15 mL Mouth Rinse 10 times per day Deterding, Guadelupe Sabin, MD   15 mL at 01/04/18 0954  . propofol (DIPRIVAN) 1000 MG/100ML infusion  5-80 mcg/kg/min Intravenous Continuous Deterding, Guadelupe Sabin, MD 32.2 mL/hr at 01/04/18 0948 80 mcg/kg/min at 01/04/18 0948  . senna-docusate (Senokot-S) tablet 1 tablet  1 tablet Oral QHS PRN Kerney Elbe, MD      . succinylcholine (ANECTINE) injection 120 mg  120 mg Intravenous Once Charlesetta Shanks, MD      . ticagrelor (BRILINTA) tablet 90 mg  90 mg Oral BID Luanne Bras, MD   90 mg at 01/04/18 1003   Or  . ticagrelor (BRILINTA) tablet 90 mg  90 mg Per Tube BID Deveshwar, Willaim Rayas, MD        OBJECTIVE: [name] examined in bed; he is intubated; opens eyes to voice; Lungs no rales or wheezes, heart RRR, abd soft, +BS. Did not du formal neuro exam but per Dr Phoebe Sharps note dense left  hemiplegia, right sided weakness  Vitals:   01/04/18 1030 01/04/18 1045  BP: 113/78 121/78  Pulse: 73 95  Resp: 15 15  Temp:    SpO2: 100% 100%     Body mass index is 20.63 kg/m.   Wt Readings from Last 3 Encounters:  01/03/18 147 lb 14.9 oz (67.1 kg)  06/17/17 145 lb 9.6 oz (66 kg)  05/27/17 150 lb (68 kg)    LAB RESULTS:  CMP     Component Value Date/Time   NA 132 (L) 01/04/2018 0346   K 3.8 01/04/2018 0346   CL 101 01/04/2018 0346   CO2 22 01/04/2018 0346   GLUCOSE 113 (H) 01/04/2018 0346  BUN 17 01/04/2018 0346   CREATININE 0.50 (L) 01/04/2018 0346   CALCIUM 8.2 (L) 01/04/2018 0346   PROT 7.1 01/03/2018 1120   ALBUMIN 4.0 01/03/2018 1120   AST 20 01/03/2018 1120   ALT 12 01/03/2018 1120   ALKPHOS 63 01/03/2018 1120   BILITOT 1.0 01/03/2018 1120   GFRNONAA >60 01/04/2018 0346   GFRAA >60 01/04/2018 0346    No results found for: TOTALPROTELP, ALBUMINELP, A1GS, A2GS, BETS, BETA2SER, GAMS, MSPIKE, SPEI  No results found for: KPAFRELGTCHN, LAMBDASER, KAPLAMBRATIO  Lab Results  Component Value Date   WBC 3.6 (L) 01/04/2018   NEUTROABS 3.3 01/04/2018   HGB 19.1 (H) 01/04/2018   HCT 54.2 (H) 01/04/2018   MCV 93.9 01/04/2018   PLT 137 (L) 01/04/2018    _0 @  No results found for: LABCA2  No components found for: GLOVFI433  Recent Labs  Lab 01/03/18 1120  INR 1.16    No results found for: LABCA2  No results found for: IRJ188  No results found for: CZY606  No results found for: TKZ601  No results found for: CA2729  No components found for: HGQUANT  No results found for: CEA1 / No results found for: CEA1   Lab Results  Component Value Date   AFPTUMOR 2.8 06/17/2017    No results found for: CHROMOGRNA  No results found for: PSA1  Admission on 01/03/2018  Component Date Value Ref Range Status  . Lactic Acid, Venous 01/03/2018 5.70* 0.5 - 1.9 mmol/L Final  . Comment 01/03/2018 NOTIFIED PHYSICIAN   Final  . Lactic Acid,  Venous 01/03/2018 3.63* 0.5 - 1.9 mmol/L Final  . Comment 01/03/2018 NOTIFIED PHYSICIAN   Final  . Sodium 01/03/2018 140  135 - 145 mmol/L Final  . Potassium 01/03/2018 3.4* 3.5 - 5.1 mmol/L Final  . Chloride 01/03/2018 100  98 - 111 mmol/L Final  . CO2 01/03/2018 26  22 - 32 mmol/L Final  . Glucose, Bld 01/03/2018 103* 70 - 99 mg/dL Final  . BUN 01/03/2018 21* 6 - 20 mg/dL Final  . Creatinine, Ser 01/03/2018 1.01  0.61 - 1.24 mg/dL Final  . Calcium 01/03/2018 9.7  8.9 - 10.3 mg/dL Final  . Total Protein 01/03/2018 7.1  6.5 - 8.1 g/dL Final  . Albumin 01/03/2018 4.0  3.5 - 5.0 g/dL Final  . AST 01/03/2018 20  15 - 41 U/L Final  . ALT 01/03/2018 12  0 - 44 U/L Final  . Alkaline Phosphatase 01/03/2018 63  38 - 126 U/L Final  . Total Bilirubin 01/03/2018 1.0  0.3 - 1.2 mg/dL Final  . GFR calc non Af Amer 01/03/2018 >60  >60 mL/min Final  . GFR calc Af Amer 01/03/2018 >60  >60 mL/min Final   Comment: (NOTE) The eGFR has been calculated using the CKD EPI equation. This calculation has not been validated in all clinical situations. eGFR's persistently <60 mL/min signify possible Chronic Kidney Disease.   Georgiann Hahn gap 01/03/2018 14  5 - 15 Final   Performed at [facility], [place] [address] [place], [place] [address]  . WBC 01/03/2018 15.1* 4.0 - 10.5 K/uL Final  . RBC 01/03/2018 3.70* 4.22 - 5.81 MIL/uL Final  . Hemoglobin 01/03/2018 12.0* 13.0 - 17.0 g/dL Final  . HCT 01/03/2018 35.7* 39.0 - 52.0 % Final  . MCV 01/03/2018 96.5  80.0 - 100.0 fL Final  . MCH 01/03/2018 32.4  26.0 - 34.0 pg Final  . MCHC 01/03/2018 33.6  30.0 - 36.0  g/dL Final  . RDW 01/03/2018 15.0  11.5 - 15.5 % Final  . Platelets 01/03/2018 298  150 - 400 K/uL Final  . nRBC 01/03/2018 0.0  0.0 - 0.2 % Final  . Neutrophils Relative % 01/03/2018 86  % Final  . Neutro Abs 01/03/2018 13.1* 1.7 - 7.7 K/uL Final  . Lymphocytes Relative 01/03/2018 12  % Final  . Lymphs Abs 01/03/2018 1.8  0.7 - 4.0 K/uL  Final  . Monocytes Relative 01/03/2018 1  % Final  . Monocytes Absolute 01/03/2018 0.1  0.1 - 1.0 K/uL Final  . Eosinophils Relative 01/03/2018 0  % Final  . Eosinophils Absolute 01/03/2018 0.0  0.0 - 0.5 K/uL Final  . Basophils Relative 01/03/2018 0  % Final  . Basophils Absolute 01/03/2018 0.0  0.0 - 0.1 K/uL Final  . Immature Granulocytes 01/03/2018 1  % Final  . Abs Immature Granulocytes 01/03/2018 0.12* 0.00 - 0.07 K/uL Final   Performed at [facility], [place] [address], [place], [place] [address]  . Lipase 01/03/2018 22  11 - 51 U/L Final   Performed at [facility], [place] [address] [place], [place] [address]  . Procalcitonin 01/03/2018 <0.10  ng/mL Final   Comment:        Interpretation: PCT (Procalcitonin) <= 0.5 ng/mL: Systemic infection (sepsis) is not likely. Local bacterial infection is possible. (NOTE)       Sepsis PCT Algorithm           Lower Respiratory Tract                                      Infection PCT Algorithm    ----------------------------     ----------------------------         PCT < 0.25 ng/mL                PCT < 0.10 ng/mL         Strongly encourage             Strongly discourage   discontinuation of antibiotics    initiation of antibiotics    ----------------------------     -----------------------------       PCT 0.25 - 0.50 ng/mL            PCT 0.10 - 0.25 ng/mL               OR       >80% decrease in PCT            Discourage initiation of                                            antibiotics      Encourage discontinuation           of antibiotics    ----------------------------     -----------------------------         PCT >= 0.50 ng/mL              PCT 0.26 - 0.50 ng/mL               AND                                 <  80% decrease in PCT             Encourage initiation of                                             antibiotics       Encourage continuation           of antibiotics     ----------------------------     -----------------------------        PCT >= 0.50 ng/mL                  PCT > 0.50 ng/mL               AND         increase in PCT                  Strongly encourage                                      initiation of antibiotics    Strongly encourage escalation           of antibiotics                                     -----------------------------                                           PCT <= 0.25 ng/mL                                                 OR                                        > 80% decrease in PCT                                     Discontinue / Do not initiate                                             antibiotics Performed at [place] [address] [place],  [address]   . pH, Ven 01/03/2018 7.439* 7.250 - 7.430 Final  . pCO2, Ven 01/03/2018 34.4* 44.0 - 60.0 mmHg Final  . pO2, Ven 01/03/2018 73.4* 32.0 - 45.0 mmHg Final  . Bicarbonate 01/03/2018 22.9  20.0 - 28.0 mmol/L Final  . Acid-base deficit 01/03/2018 0.3  0.0 - 2.0 mmol/L Final  . O2 Saturation 01/03/2018 94.5  % Final  . Patient temperature 01/03/2018 98.6   Final  . Collection site 01/03/2018 VEIN   Final  . Drawn by 01/03/2018 DRAWN BY RN   Final  .  Sample type 01/03/2018 VEIN   Final   Performed at [facility], [place] [address], [place], [place] [address]  . aPTT 01/03/2018 23* 24 - 36 seconds Final   Performed at [facility], [place] [address] [place], [place] [address]  . Prothrombin Time 01/03/2018 14.7  11.4 - 15.2 seconds Final  . INR 01/03/2018 1.16   Final   Performed at [facility], [place] [address], [place], [place] [address]  . Total CK 01/03/2018 119  49 - 397 U/L Final   Performed at [facility], [place] [address], [place], [place] [address]  . Phosphorus 01/03/2018 2.9  2.5 - 4.6 mg/dL Final   Performed at [place]  [address], [place], [place] [address]  . Magnesium 01/03/2018 1.8  1.7 - 2.4 mg/dL Final   Performed at [place] [address] [place], [place] [address]  . TSH 01/03/2018 2.021  0.350 - 4.500 uIU/mL Final   Comment: Performed by a 3rd Generation assay with a functional sensitivity of <=0.01 uIU/mL. Performed at [facility], [place] [address] [place], [place] [address]   . Glucose-Capillary 01/03/2018 111* 70 - 99 mg/dL Final  . Troponin I 01/03/2018 <0.03  <0.03 ng/mL Final   Performed at [facility], [place] [address] [place], [place] [address]  . Hgb A1c MFr Bld 01/04/2018 6.4* 4.8 - 5.6 % Final   Comment: (NOTE) Pre diabetes:          5.7%-6.4% Diabetes:              >6.4% Glycemic control for   <7.0% adults with diabetes   . Mean Plasma Glucose 01/04/2018 136.98  mg/dL Final   Performed at [place] [address], [place], [place] [address]  . Cholesterol 01/04/2018 174  0 - 200 mg/dL Final  . Triglycerides 01/04/2018 1,966* <150 mg/dL Final   RESULTS CONFIRMED BY MANUAL DILUTION  . HDL 01/04/2018 NOT REPORTED DUE TO HIGH TRIGLYCERIDES  >40 mg/dL Final  . Total CHOL/HDL Ratio 01/04/2018 NOT REPORTED DUE TO HIGH TRIGLYCERIDES  RATIO Final  . VLDL 01/04/2018 UNABLE TO CALCULATE IF TRIGLYCERIDE OVER 400 mg/dL  0 - 40 mg/dL Final  . LDL Cholesterol 01/04/2018 UNABLE TO CALCULATE IF TRIGLYCERIDE OVER 400 mg/dL  0 - 99 mg/dL Final   Performed at [place] [address] [place],  [address]  . WBC 01/04/2018 3.6* 4.0 - 10.5 K/uL Final  . RBC 01/04/2018 5.77  4.22 - 5.81 MIL/uL Final  . Hemoglobin 01/04/2018 19.1* 13.0 - 17.0 g/dL Final  . HCT 01/04/2018 54.2* 39.0 - 52.0 % Final  . MCV 01/04/2018 93.9  80.0 - 100.0 fL Final  . MCH 01/04/2018 33.1  26.0 - 34.0 pg Final  . MCHC 01/04/2018 35.2  30.0 - 36.0 g/dL Final   CORRECTED FOR COLD AGGLUTININS  . RDW 01/04/2018 15.6* 11.5 - 15.5 % Final  .  Platelets 01/04/2018 137* 150 - 400 K/uL Final  . nRBC 01/04/2018 0.0  0.0 - 0.2 % Final  . Neutrophils Relative % 01/04/2018 92  % Final  . Neutro Abs 01/04/2018 3.3  1.7 - 7.7 K/uL Final  . Lymphocytes Relative 01/04/2018 6  % Final  . Lymphs Abs 01/04/2018 0.2* 0.7 - 4.0 K/uL Final  . Monocytes Relative 01/04/2018 1  % Final  . Monocytes Absolute 01/04/2018 0.0* 0.1 - 1.0 K/uL Final  .  Eosinophils Relative 01/04/2018 0  % Final  . Eosinophils Absolute 01/04/2018 0.0  0.0 - 0.5 K/uL Final  . Basophils Relative 01/04/2018 0  % Final  . Basophils Absolute 01/04/2018 0.0  0.0 - 0.1 K/uL Final  . Immature Granulocytes 01/04/2018 1  % Final  . Abs Immature Granulocytes 01/04/2018 0.04  0.00 - 0.07 K/uL Final   Performed at [facility], [place] [address], [place], [place] [address]  . Sodium 01/04/2018 132* 135 - 145 mmol/L Final  . Potassium 01/04/2018 3.8  3.5 - 5.1 mmol/L Final  . Chloride 01/04/2018 101  98 - 111 mmol/L Final  . CO2 01/04/2018 22  22 - 32 mmol/L Final  . Glucose, Bld 01/04/2018 113* 70 - 99 mg/dL Final  . BUN 01/04/2018 17  6 - 20 mg/dL Final  . Creatinine, Ser 01/04/2018 0.50* 0.61 - 1.24 mg/dL Final  . Calcium 01/04/2018 8.2* 8.9 - 10.3 mg/dL Final  . GFR calc non Af Amer 01/04/2018 >60  >60 mL/min Final  . GFR calc Af Amer 01/04/2018 >60  >60 mL/min Final   Comment: (NOTE) The eGFR has been calculated using the CKD EPI equation. This calculation has not been validated in all clinical situations. eGFR's persistently <60 mL/min signify possible Chronic Kidney Disease.   Georgiann Hahn gap 01/04/2018 9  5 - 15 Final   Performed at [facility], [place] [address] [place], [place] [address]  . MRSA by PCR 01/03/2018 NEGATIVE  NEGATIVE Final   Comment:        The GeneXpert MRSA Assay (FDA approved for NASAL specimens only), is one component of a comprehensive MRSA colonization surveillance program. It is not intended to diagnose MRSA infection nor to guide  or monitor treatment for MRSA infections. Performed at [facility], [place] [address], [place], [place] [address]   . Opiates 01/04/2018 NONE DETECTED  NONE DETECTED Final  . Cocaine 01/04/2018 NONE DETECTED  NONE DETECTED Final  . Benzodiazepines 01/04/2018 NONE DETECTED  NONE DETECTED Final  . Amphetamines 01/04/2018 NONE DETECTED  NONE DETECTED Final  . Tetrahydrocannabinol 01/04/2018 POSITIVE* NONE DETECTED Final  . Barbiturates 01/04/2018 NONE DETECTED  NONE DETECTED Final   Comment: (NOTE) DRUG SCREEN FOR MEDICAL PURPOSES ONLY.  IF CONFIRMATION IS NEEDED FOR ANY PURPOSE, NOTIFY LAB WITHIN 5 DAYS. LOWEST DETECTABLE LIMITS FOR URINE DRUG SCREEN Drug Class                     Cutoff (ng/mL) Amphetamine and metabolites    1000 Barbiturate and metabolites    200 Benzodiazepine                 578 Tricyclics and metabolites     300 Opiates and metabolites        300 Cocaine and metabolites        300 THC                            50 Performed at [facility], [place] [address], [place],  [address]     (this displays the last labs from the last 3 days)  No results found for: TOTALPROTELP, ALBUMINELP, A1GS, A2GS, BETS, BETA2SER, GAMS, MSPIKE, SPEI (this displays SPEP labs)  No results found for: KPAFRELGTCHN, LAMBDASER, KAPLAMBRATIO (kappa/lambda light chains)  No results found for: HGBA, HGBA2QUANT, HGBFQUANT, HGBSQUAN (Hemoglobinopathy evaluation)   Lab Results  Component Value Date  LDH 140 06/17/2017    No results found for: IRON, TIBC, IRONPCTSAT (Iron and TIBC)  No results found for: FERRITIN  Urinalysis    Component Value Date/Time   COLORURINE YELLOW 05/27/2017 [place] 05/27/2017 1159   LABSPEC 1.012 05/27/2017 1159   PHURINE 6.0 05/27/2017 1159   GLUCOSEU NEGATIVE 05/27/2017 1159   HGBUR NEGATIVE 05/27/2017 1159   BILIRUBINUR NEGATIVE 05/27/2017 1159   KETONESUR NEGATIVE 05/27/2017 1159   PROTEINUR NEGATIVE  05/27/2017 1159   UROBILINOGEN 0.2 09/13/2013 1330   NITRITE NEGATIVE 05/27/2017 1159   LEUKOCYTESUR NEGATIVE 05/27/2017 1159     STUDIES: Ct Angio Head W Or Wo Contrast  Result Date: 01/03/2018 CLINICAL DATA:  Altered mental status. EXAM: CT ANGIOGRAPHY HEAD AND NECK TECHNIQUE: Multidetector CT imaging of the head and neck was performed using the standard protocol during bolus administration of intravenous contrast. Multiplanar CT image reconstructions and MIPs were obtained to evaluate the vascular anatomy. Carotid stenosis measurements (when applicable) are obtained utilizing NASCET criteria, using the distal internal carotid diameter as the denominator. CONTRAST:  117m ISOVUE-370 IOPAMIDOL (ISOVUE-370) INJECTION 76% COMPARISON:  Noncontrast head CT earlier today FINDINGS: CTA NECK FINDINGS Aortic arch: Normal.  Three vessel branching. Right carotid system: Limited by motion but no stenosis, ulceration, or dissection. Left carotid system: Limited by motion but no stenosis, ulceration, or dissection. There is mild atherosclerotic plaque at the ICA bulb. Vertebral arteries: No proximal subclavian stenosis or atherosclerosis. Both vertebral arteries are motion degraded but where not distorted they are widely patent. Skeleton: Accounting for motion there is no acute finding Other neck: Nasal trumpet in place. Upper chest: Negative Review of the MIP images confirms the above findings CTA HEAD FINDINGS Anterior circulation: Limited by motion. No major branch occlusion flow limiting stenosis is seen. No gross aneurysm. Posterior circulation: Mild right vertebral artery dominance. Occluded basilar at the vertebrobasilar junction with reconstitution at the terminus. Venous sinuses: Hypoplastic on the right. The dural venous sinuses are patent. Anatomic variants: Negative for aneurysm. Delayed phase: Patient's acute infarcts are nonenhancing. Critical Value/emergent results were called by telephone at the time  of interpretation on 01/03/2018 at 1:20 pm to Dr. MCharlesetta Shanks, who verbally acknowledged these results. Review of the MIP images confirms the above findings IMPRESSION: 1. 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 2. No embolic source seen in the neck, although limited by significant motion artifact. Electronically Signed   By: JMonte FantasiaM.D.   On: 01/03/2018 13:27   Ct Head Wo Contrast  Result Date: 01/03/2018 CLINICAL DATA:  Altered level of consciousness. Seizure-like activity EXAM: CT HEAD WITHOUT CONTRAST TECHNIQUE: Contiguous axial images were obtained from the base of the skull through the vertex without intravenous contrast. COMPARISON:  None. FINDINGS: Brain: Patchy low-density in the bilateral cerebellum and occipital lobes. No low-density seen in the anterior circulation distribution. No hemorrhage, hydrocephalus, or mass effect. Vascular: No hyperdense vessel, including the basilar Skull: Negative Sinuses/Orbits: Negative Other: Critical Value/emergent results were called by telephone at the time of interpretation on 01/03/2018 at 12:43 pm to Dr. MJeannie DonePFEIFFER , who verbally acknowledged these results. IMPRESSION: Patchy low-density in the bilateral cerebellum and occipital lobes. This pattern suggests posterior circulation acute infarcts-consider CTA. Given young age and recent chemotherapy underlying PRES is a consideration-although no symmetric cerebral edema. Metastatic disease with edema is possible but less likely given the distribution. Electronically Signed   By: JMonte FantasiaM.D.   On: 01/03/2018 12:45  Ct Angio Neck W And/or Wo Contrast  Result Date: 01/03/2018 CLINICAL DATA:  Altered mental status. EXAM: CT ANGIOGRAPHY HEAD AND NECK TECHNIQUE: Multidetector CT imaging of the head and neck was performed using the standard protocol during bolus administration of intravenous contrast. Multiplanar CT image reconstructions and MIPs were  obtained to evaluate the vascular anatomy. Carotid stenosis measurements (when applicable) are obtained utilizing NASCET criteria, using the distal internal carotid diameter as the denominator. CONTRAST:  166m ISOVUE-370 IOPAMIDOL (ISOVUE-370) INJECTION 76% COMPARISON:  Noncontrast head CT earlier today FINDINGS: CTA NECK FINDINGS Aortic arch: Normal.  Three vessel branching. Right carotid system: Limited by motion but no stenosis, ulceration, or dissection. Left carotid system: Limited by motion but no stenosis, ulceration, or dissection. There is mild atherosclerotic plaque at the ICA bulb. Vertebral arteries: No proximal subclavian stenosis or atherosclerosis. Both vertebral arteries are motion degraded but where not distorted they are widely patent. Skeleton: Accounting for motion there is no acute finding Other neck: Nasal trumpet in place. Upper chest: Negative Review of the MIP images confirms the above findings CTA HEAD FINDINGS Anterior circulation: Limited by motion. No major branch occlusion flow limiting stenosis is seen. No gross aneurysm. Posterior circulation: Mild right vertebral artery dominance. Occluded basilar at the vertebrobasilar junction with reconstitution at the terminus. Venous sinuses: Hypoplastic on the right. The dural venous sinuses are patent. Anatomic variants: Negative for aneurysm. Delayed phase: Patient's acute infarcts are nonenhancing. Critical Value/emergent results were called by telephone at the time of interpretation on 01/03/2018 at 1:20 pm to Dr. MCharlesetta Shanks, who verbally acknowledged these results. Review of the MIP images confirms the above findings IMPRESSION: 1. 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 2. No embolic source seen in the neck, although limited by significant motion artifact. Electronically Signed   By: JMonte FantasiaM.D.   On: 01/03/2018 13:27   Mr Brain Wo Contrast  Result Date: 01/03/2018 CLINICAL DATA:   Follow-up basilar artery thrombosis and stroke. History of testicular cancer. EXAM: MRI HEAD WITHOUT CONTRAST TECHNIQUE: Axial diffusion weighted imaging, axial T2 FLAIR and axial T2. COMPARISON:  CT HEAD and CT angiogram head and neck January 03, 2018 FINDINGS: Brain: Patchy to confluent reduced diffusion bilateral cerebellum, confluent reduced diffusion within the lower pons. Confluent LEFT greater than RIGHT reduced diffusion bilateral occipital lobes, patchy thalami and RIGHT splenium of corpus callosum reduced diffusion. Subcentimeter areas of reduced diffusion bilateral posterior temporal lobes. All areas of reduced diffusion demonstrate low ADC values with mild T2 hyperintense cytotoxic edema. Patent fourth ventricle, no hydrocephalus. No midline shift. No abnormal extra-axial fluid collections. Vascular: Intermediate signal vertebral arteries and basilar artery. Skull and upper cervical spine: Not tailored for evaluation, non suspicious. Sinuses/Orbits: Mild paranasal sinus mucosal thickening. RIGHT mastoid effusion. Other: None. IMPRESSION: 1. Acute numerous supra- and infratentorial posterior circulation nonhemorrhagic infarcts. 2. 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 3. Critical Value/emergent results text paged to [facility] AM[facility] secure system on 01/03/2018 at 3:50 pm, including interpreting physician's phone number. Electronically Signed   By: CElon AlasM.D.   On: 01/03/2018 16:01   Dg Chest Port 1 View  Result Date: 01/03/2018 CLINICAL DATA:  Acute basilar artery thrombus with resulting cerebellar, occipital, and brainstem infarcts. Basilar stent placement. EXAM: PORTABLE CHEST 1 VIEW COMPARISON:  01/03/2018 FINDINGS: The endotracheal tube is satisfactorily position with tip 4.5 cm above the carina. Nasogastric tube enters the stomach with side port in the stomach body. The lungs appear  clear. Heart size within normal limits. Mediastinal contours normal.  IMPRESSION: 1. Endotracheal and nasogastric tubes appear satisfactorily position. The lungs appear clear. Electronically Signed   By: Van Clines M.D.   On: 01/03/2018 19:35   Dg Chest Portable 1 View  Result Date: 01/03/2018 CLINICAL DATA:  Intubation, history testicular cancer, smoker, hypertension EXAM: PORTABLE CHEST 1 VIEW COMPARISON:  Portable exam 1354 hours compared to 1140 hours FINDINGS: Endotracheal tube with tip projecting 19 mm above carina. Nasogastric tube extends into stomach. Normal heart size, mediastinal contours, and pulmonary vascularity. Tips of lung apices excluded. Visualized lungs clear. No infiltrate, pleural effusion or gross pneumothorax. IMPRESSION: Tube positions as above. No acute abnormalities. Electronically Signed   By: Lavonia Dana M.D.   On: 01/03/2018 14:27   Dg Chest Port 1 View  Result Date: 01/03/2018 CLINICAL DATA:  Nausea since first chemotherapy treatment this week, smoker, testicular cancer EXAM: PORTABLE CHEST 1 VIEW COMPARISON:  Portable exam 1140 hours compared to 08/07/2016 FINDINGS: Normal heart size, mediastinal contours, and pulmonary vascularity. Lungs clear. No pleural effusion or pneumothorax. Bones unremarkable. IMPRESSION: Normal exam. Electronically Signed   By: Lavonia Dana M.D.   On: 01/03/2018 11:58   PATHOLOGY: Final Diagnosis  (Outside case: [id number], 26 slides, original procedure date: 06/17/17)  1. 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 - No malignancy identified.   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 [place] man admitted with seizures secondary to basilar artery thrombosis with multiple other non-hemorrhagic infarcts noted on MRI, with a history of non-seminomatous testicular cancer as follows:  (1) s/p Right radical orchiectomy 06/17/2017 for a pT2 cN0 tumor involving hium, with positive LVI but negative margins  (2) s/p retroperitoneal node dissection 11/02/2017 confirming pN1 disease  (3) s/p one of two planned cycles of cisplatin/etoposide given 12/19/2017 - 01/01/2018 at [facility]  PLAN: Mr Mcdonagh was treated appropriately for his testicular cancer. This is one of the most curable malignancies and with standard therapy, which he was receiving, the long term disease free survival is >95%. If all had gone well, he would receive one more cycle of chemotherapy early December.   While any chemotherapy can increase the risk of thrombosis, cisplatin and etoposide are not particularly associated with this complicated. Also hypercoagulability of malignancy is most associated with adenocarcinomas, not so much testicular tumors. Accordingly I would look for other causes of the patient's strokes. He does have a history of HTN  which has been difficult to manage per [facility] notes.  His counts are adequate but he is currently day 7 cycle 1 of chemo and he may nadit further over the next few days--will add differential to daily CBC  Will follow with you.    Chauncey Cruel, MD   01/04/2018 11:00 AM Medical Oncology and Hematology [facility] [address] [place], [place] [address] Tel. [phone]    Fax. [phone]

## 2018-01-04 NOTE — Progress Notes (Addendum)
STROKE TEAM PROGRESS NOTE   SUBJECTIVE (INTERVAL HISTORY) His RN is at the bedside.  Pt still intubated on sedation.  However, open eyes on voice, follows all simple commands.  Still has left hemiplegia, however able to move on the right side spontaneously although weak.  Had endovascular procedure yesterday, received a stent, currently on aspirin and Brilinta.   OBJECTIVE Vitals:   01/04/18 0615 01/04/18 0630 01/04/18 0645 01/04/18 0800  BP: 134/82 136/85 131/78   Pulse: 87 86 81   Resp: 15 15 15    Temp:    99.2 F (37.3 C)  TempSrc:    Axillary  SpO2: 100% 100% 100%   Weight:      Height:        CBC:  Recent Labs  Lab 01/03/18 1120 01/04/18 0346  WBC 15.1* 3.6*  NEUTROABS 13.1* 3.3  HGB 12.0* 19.1*  HCT 35.7* 54.2*  MCV 96.5 93.9  PLT 298 137*    Basic Metabolic Panel:  Recent Labs  Lab 01/03/18 1120 01/04/18 0346  NA 140 132*  K 3.4* 3.8  CL 100 101  CO2 26 22  GLUCOSE 103* 113*  BUN 21* 17  CREATININE 1.01 0.50*  CALCIUM 9.7 8.2*  MG 1.8  --   PHOS 2.9  --     Lipid Panel:     Component Value Date/Time   CHOL 174 01/04/2018 0346   TRIG 1,966 (H) 01/04/2018 0346   HDL NOT REPORTED DUE TO HIGH TRIGLYCERIDES 01/04/2018 0346   CHOLHDL NOT REPORTED DUE TO HIGH TRIGLYCERIDES 01/04/2018 0346   VLDL UNABLE TO CALCULATE IF TRIGLYCERIDE OVER 400 mg/dL 01/04/2018 0346   LDLCALC UNABLE TO CALCULATE IF TRIGLYCERIDE OVER 400 mg/dL 01/04/2018 0346   HgbA1c:  Lab Results  Component Value Date   HGBA1C 6.4 (H) 01/04/2018   Urine Drug Screen: No results found for: LABOPIA, COCAINSCRNUR, LABBENZ, AMPHETMU, THCU, LABBARB  Alcohol Level No results found for: ETH  IMAGING  Ct Angio Head W Or Wo Contrast Ct Angio Neck W And/or Wo Contrast 01/03/2018 IMPRESSION:  1. 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  2. No embolic source seen in the neck, although limited by significant motion artifact.    Ct Head Wo  Contrast 01/03/2018 IMPRESSION:  Patchy low-density in the bilateral cerebellum and occipital lobes. This pattern suggests posterior circulation acute infarcts-consider CTA. Given young age and recent chemotherapy underlying PRES is a consideration-although no symmetric cerebral edema. Metastatic disease with edema is possible but less likely given the distribution.    Mr Brain Wo Contrast 01/03/2018 IMPRESSION:  1. Acute numerous supra- and infratentorial posterior circulation nonhemorrhagic infarcts.  2. 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    Transthoracic Echocardiogram - pending   Bilateral Lower Extremity Venous Dopplers - pending   PHYSICAL EXAM Temp:  [97.6 F (36.4 C)-100.4 F (38 C)] 99.2 F (37.3 C) (11/24 0800) Pulse Rate:  [72-127] 78 (11/24 0800) Resp:  [0-25] 15 (11/24 0800) BP: (113-197)/(68-135) 125/80 (11/24 0800) SpO2:  [98 %-100 %] 100 % (11/24 0800) Arterial Line BP: (118-171)/(61-94) 142/67 (11/24 0645) FiO2 (%):  [30 %-100 %] 30 % (11/24 0800) Weight:  [66 kg-67.1 kg] 67.1 kg (11/23 1845)  General - Well nourished, well developed, intubated and on sedation.  Ophthalmologic - fundi not visualized due to noncooperation.  Cardiovascular - Regular rate and rhythm.  Neuro - intubated on sedation, lethargic and sleepy, but able to open eyes briefly on voice. Able to follow all simple  commands. PERRL, intact left gaze, but with right gaze has disconjugation with right eye abduction deficit and left eye adduction incomplete. Not blinking to visual threat bilaterally. Positive corneal reflex bilaterally, positive cough reflex. With command, pt is able to raise up RUE 3/5 proximal and distal, RLE proximal 2/5 and distal 3+/5. LUE and LLE flaccid. Left babinski positive. Sensation, coordination not cooperative and gait not tested.   ASSESSMENT/PLAN [name] is a 37 y.o. male with history of renal insufficiency and testicular  cancer with lymph node metastasis who presented to the [facility] ED this morning with AMS, rigors and dystonic posturing. He did not receive IV t-PA due to unknown time of onset.  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 Chemo drug related vs. Paradoxical emboli  CT head - Patchy low-density in the bilateral cerebellum and occipital lobes.  MRI head - Acute numerous supra - and infratentorial posterior circulation nonhemorrhagic infarcts.   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   2D Echo - pending  LE Dopplers - pending  LDL - unable to calculate due to TG 1,966 (likely due to contamination from propofol and cleviprex), will repeat in am  HgbA1c - 6.4  EEG - pending  Recommend TEE once stable for cardioembolic work up and rule out PFO  UDS - THC positive  VTE prophylaxis - heparin subq  Diet - NPO  No antithrombotic prior to admission, now on aspirin 81 mg daily and Brilinta 90 mg BID.   Patient counseled to be compliant with his antithrombotic medications  Ongoing aggressive stroke risk factor management  Therapy recommendations:  pending  Disposition:  Pending  Right testicular tumor  Followed by oncology  With lymph node metastasis  S/p orchidectomy and currently on chemo with cisplatin and etoposide   cistplatin may have side effect of stroke  Hypercoagulable state can not be ruled out  Oncology consultation requested, Dr. Jana Hakim is on board  Respiratory failure  Intubated on sedation for airway protection  CCM on board  Wean off ventilation as able  Hypertension  Stable . BP goal < 140 first 24h post procedure  . On cleviprex . Wean off cleviprex as able . Now on multiple BP meds including amlodipine, chlorthalidone, clonidine   . Long-term BP goal normotensive  Hyperlipidemia  Lipid lowering medication PTA:  none  LDL unable to calculate due to high TG, goal < 70  TG 1966 likely contamination from propofol and cleviprex  Repeat lipid panel in am - do not draw blood from A line.  Current lipid lowering medication: none  Continue statin at discharge  Tobacco abuse  Current cigar smoker  Smoking cessation counseling will be provided  Leukocytosis with low grade fever  WBC 15.1->3.6 - monitor  Tmax 100.4  UA pending  CXR no active disease  Received 1-2 doses of vanco, rocephin and acyclovir - all stopped  Other Stroke Risk Factors  THC user - UDS positive for THC  Other Active Problems  Hemoglobin 12.0->19.1 - continue to monitor  Hyponatremia Na - [facility] day # 1  This patient is critically ill due to [facility] occlusion, extensive posterior circulation, testicular mass, hypertension, leukocytosis and fever and at significant risk of neurological worsening, death form recurrent stroke, hemorrhagic conversion, heart failure, seizure. This patient's care requires constant monitoring  of vital signs, hemodynamics, respiratory and cardiac monitoring, review of multiple databases, neurological assessment, discussion with family, other specialists and medical decision making of high complexity. I spent 40 minutes of neurocritical care time in the care of this patient.   Rosalin Hawking, MD PhD Stroke Neurology 01/04/2018 11:05 AM    To contact Stroke Continuity provider, please refer to [email or web address] After hours, contact General Neurology

## 2018-01-04 NOTE — Progress Notes (Addendum)
NAMEAsiel Hubbard, MRN:  [id number], DOB:  01/21/1981, LOS: 1 ADMISSION DATE:  01/03/2018, CONSULTATION DATE:  11/23 REFERRING MD:  Dr. Cheral Marker , CHIEF COMPLAINT:  CVA   Brief History   37 year old male undergoing treatment for testicular cancer admitted 11/23 with brainstem stroke and was left on vent after IR procedure.   History of present illness   Patient is encephalopathic and/or intubated. Therefore history has been obtained from chart review.  37 year old male with PMH as below, which is significant for testicular cancer (nonseminomatous germ cell tumor) and is currently undergoing chemotherapy (cycle one Cisplatin/etoposide started 11/18 and he underwent daily infusions through 11/21). He skipped 11/22 infusion due to profound fatigue. This fatigue progressed to include AMS< rigors, and posturing causing him to present to [facility] ED where CT head was done. CT demonstrated concern for infacrtion of the cerebellum and occipital lobes. CTA demonstrated stenotic basilar artery. He then had seizre like episode and was transferred to [facility] for neurology evaluation, who's exam was concerning for acute brainstem syndrome. The patient was taken to IR for revascularization and remained on vent post-operatively at which time he was transferred to ICU. PCCM consulted.   Past Medical History  Testicular Ca, hypertension  Significant Hospital Events   11/23 admit for brainstem stroke. IR revascularization. On vent.   Consults:    Procedures:  11/23 IR cerebral arteriogram with revascularization.  ETT 11/23 >  Significant Diagnostic Tests:  CT head 11/23 > Patchy low-density in the bilateral cerebellum and occipital lobes. This pattern suggests posterior circulation acute infarcts-consider CTA. Given young age and recent chemotherapy underlying PRES is a consideration-although no symmetric cerebral edema. Metastatic disease with edema is possible but less likely given the distribution. 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 2. No embolic source seen in the neck, although limited by significant motion artifact. MRI Brain 11/23 >> numerous acute supra and infratentorial posterior circulation nonhemorrhagic infarcts, acute basilar artery thrombosis  Micro Data:  Blood 11/23 >  Antimicrobials:  Periop ceftriaxone  Interim history/subjective:  Hemodynamically stable overnight Remains mechanically ventilated He did follow commands on the right on his wake up assessment this morning. Was apneic on propofol this morning  Objective   Blood pressure 103/73, pulse 79, temperature 99.2 F (37.3 C), temperature source Axillary, resp. rate 15, height 5\' 11"  (1.803 m), weight 67.1 kg, SpO2 100 %.    Vent Mode: PRVC FiO2 (%):  [30 %-100 %] 30 % Set Rate:  [15 bmp] 15 bmp Vt Set:  [540 mL-600 mL] 600 mL PEEP:  [5 cmH20] 5 cmH20 Plateau Pressure:  [14 cmH20-20 cmH20] 16 cmH20   Intake/Output Summary (Last 24 hours) at 01/04/2018 1011 Last data filed at 01/04/2018 0800 Gross per 24 hour  Intake 2754.85 ml  Output 4050 ml  Net -1295.15 ml   Filed Weights   01/03/18 1300 01/03/18 1845  Weight: 66 kg 67.1 kg    Examination: General: Well-developed young man, on mechanical ventilation, currently sedated HENT: Normocephalic, pupils equal, ET tube in good position Lungs: Breath sounds clear, no wheeze or crackles Cardiovascular: Regular, no murmur Abdomen: Soft, nondistended, positive bowel sounds Extremities: No edema Neuro: Sedated on propofol, does not react to voice.  He did follow commands on the right earlier today on his wake up assessment, was flaccid on the left.  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 S/p IR revascularization 11/23. History of hypertension -Aspirin, Brilinta post stenting -SBP goal 120-140,  Cleviprex ordered -Amlodipine, clonidine, chlorthalidone, labetalol as needed -Prophylactic Keppra -Decadron given x1, currently discontinued  Inability to protect airway in the setting of CVA -Continue full vent support, PRVC.  Plan for spontaneous breathing trials and pressure support when sedation lightened 11/24.  Note that because this is a brainstem stroke airway protection will be an issue, difficult to fully assess before a trial of extubation.  May ultimately require tracheostomy -VAP prevention order set -Continue sedation with propofol and wean as able  Relative leukopenia, thrombocytopenia on CBC 11/24.  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  Stable thus far -Follow BMP, urine output  Hypokalemia, resolved -Follow BMP -Replace electrolytes as indicated  Best practice:  Diet: NPO, likely start tube feeding in the next 24 hours if no plans for quick extubation Pain/Anxiety/Delirium protocol (if indicated): Propofol per protocol VAP protocol (if indicated): Per protocol DVT prophylaxis: SCDs for now, will defer chemoprophylaxis to primary team.  GI prophylaxis: Pepcid Glucose control: n/a Mobility: BR Code Status: Full Family Communication: Family updated at bedside on 11/24 Disposition: ICU  Labs   CBC: Recent Labs  Lab 01/03/18 1120 01/04/18 0346  WBC 15.1* 3.6*  NEUTROABS 13.1* 3.3  HGB 12.0* 19.1*  HCT 35.7* 54.2*  MCV 96.5 93.9  PLT 298 137*    Basic Metabolic Panel: Recent Labs  Lab 01/03/18 1120 01/04/18 0346  NA 140 132*  K 3.4* 3.8  CL 100 101  CO2 26 22  GLUCOSE 103* 113*  BUN 21* 17  CREATININE 1.01 0.50*  CALCIUM 9.7 8.2*  MG 1.8  --   PHOS 2.9  --    GFR: Estimated Creatinine Clearance: 121.2 mL/min (A) (by C-G formula based on SCr of 0.5 mg/dL (L)). Recent Labs  Lab 01/03/18 1119 01/03/18 1120  01/03/18 1332 01/04/18 0346  PROCALCITON  --  <0.10  --   --   WBC  --  15.1*  --  3.6*  LATICACIDVEN 5.70*  --  3.63*  --     Liver Function Tests: Recent Labs  Lab 01/03/18 1120  AST 20  ALT 12  ALKPHOS 63  BILITOT 1.0  PROT 7.1  ALBUMIN 4.0   Recent Labs  Lab 01/03/18 1120  LIPASE 22   No results for input(s): AMMONIA in the last 168 hours.  ABG    Component Value Date/Time   HCO3 22.9 01/03/2018 1133   ACIDBASEDEF 0.3 01/03/2018 1133   O2SAT 94.5 01/03/2018 1133     Coagulation Profile: Recent Labs  Lab 01/03/18 1120  INR 1.16    Cardiac Enzymes: Recent Labs  Lab 01/03/18 1120 01/03/18 1206  CKTOTAL 119  --   TROPONINI  --  <0.03    HbA1C: Hgb A1c MFr Bld  Date/Time Value Ref Range Status  01/04/2018 03:45 AM 6.4 (H) 4.8 - 5.6 % Final    Comment:    (NOTE) Pre diabetes:          5.7%-6.4% Diabetes:              >6.4% Glycemic control for   <7.0% adults with diabetes     CBG: Recent Labs  Lab 01/03/18 1124  GLUCAP 111*   Independent CC time 33 minutes  [name]  Lamonte Sakai, MD, PhD 01/04/2018, 10:24 AM [facility] [phone] or if no answer [phone]

## 2018-01-04 NOTE — Progress Notes (Signed)
Per CCM. Propofol was decreased to 30. Patient intermittently initiating breaths. Patient also more alert.

## 2018-01-04 NOTE — Progress Notes (Signed)
VASCULAR LAB PRELIMINARY  PRELIMINARY  PRELIMINARY  PRELIMINARY  Bilateral lower extremity venous duplex completed.    Preliminary report:  There is no DVT or SVT noted in the bilateral lower extremities.   Kelcey Wickstrom, RVT 01/04/2018, 3:17 PM

## 2018-01-05 ENCOUNTER — Other Ambulatory Visit (HOSPITAL_COMMUNITY): Payer: Medicaid Other

## 2018-01-05 ENCOUNTER — Encounter (HOSPITAL_COMMUNITY): Payer: Self-pay | Admitting: Interventional Radiology

## 2018-01-05 ENCOUNTER — Inpatient Hospital Stay (HOSPITAL_COMMUNITY): Payer: Medicaid Other

## 2018-01-05 DIAGNOSIS — J9601 Acute respiratory failure with hypoxia: Secondary | ICD-10-CM

## 2018-01-05 DIAGNOSIS — I639 Cerebral infarction, unspecified: Secondary | ICD-10-CM

## 2018-01-05 LAB — CBC WITH DIFFERENTIAL/PLATELET
BLASTS: 0 %
Band Neutrophils: 0 %
Basophils Absolute: 0 10*3/uL (ref 0.0–0.1)
Basophils Relative: 0 %
Eosinophils Absolute: 0 10*3/uL (ref 0.0–0.5)
Eosinophils Relative: 0 %
HEMATOCRIT: 30.2 % — AB (ref 39.0–52.0)
Hemoglobin: 10.9 g/dL — ABNORMAL LOW (ref 13.0–17.0)
LYMPHS PCT: 17 %
Lymphs Abs: 0.6 10*3/uL — ABNORMAL LOW (ref 0.7–4.0)
MCH: 32.6 pg (ref 26.0–34.0)
MCHC: 36.1 g/dL — ABNORMAL HIGH (ref 30.0–36.0)
MCV: 90.4 fL (ref 80.0–100.0)
MYELOCYTES: 0 %
Metamyelocytes Relative: 0 %
Monocytes Absolute: 0 10*3/uL — ABNORMAL LOW (ref 0.1–1.0)
Monocytes Relative: 1 %
NEUTROS PCT: 82 %
NRBC: 0 /100{WBCs}
Neutro Abs: 3.2 10*3/uL (ref 1.7–7.7)
Other: 0 %
PLATELETS: 204 10*3/uL (ref 150–400)
PROMYELOCYTES RELATIVE: 0 %
RBC: 3.34 MIL/uL — AB (ref 4.22–5.81)
RDW: 13.8 % (ref 11.5–15.5)
WBC: 3.8 10*3/uL — AB (ref 4.0–10.5)

## 2018-01-05 LAB — POCT I-STAT 3, ART BLOOD GAS (G3+)
ACID-BASE EXCESS: 2 mmol/L (ref 0.0–2.0)
Bicarbonate: 25.3 mmol/L (ref 20.0–28.0)
O2 Saturation: 100 %
PH ART: 7.495 — AB (ref 7.350–7.450)
TCO2: 26 mmol/L (ref 22–32)
pCO2 arterial: 32.7 mmHg (ref 32.0–48.0)
pO2, Arterial: 285 mmHg — ABNORMAL HIGH (ref 83.0–108.0)

## 2018-01-05 LAB — RENAL FUNCTION PANEL
ALBUMIN: 2.5 g/dL — AB (ref 3.5–5.0)
Anion gap: 13 (ref 5–15)
BUN: 9 mg/dL (ref 6–20)
CALCIUM: 5.7 mg/dL — AB (ref 8.9–10.3)
CO2: 15 mmol/L — ABNORMAL LOW (ref 22–32)
CREATININE: 0.85 mg/dL (ref 0.61–1.24)
Chloride: 108 mmol/L (ref 98–111)
GFR calc Af Amer: >60 mL/min (ref >60)
GFR calc non Af Amer: >60 mL/min (ref >60)
GLUCOSE: 102 mg/dL — AB (ref 70–99)
PHOSPHORUS: 4.1 mg/dL (ref 2.5–4.6)
Potassium: 2.8 mmol/L — ABNORMAL LOW (ref 3.5–5.1)
SODIUM: 136 mmol/L (ref 135–145)

## 2018-01-05 LAB — LIPID PANEL
Cholesterol: 204 mg/dL — ABNORMAL HIGH (ref 0–200)
LDL Cholesterol: UNDETERMINED mg/dL (ref 0–99)
Triglycerides: 1449 mg/dL — ABNORMAL HIGH (ref <150)
VLDL: UNDETERMINED mg/dL (ref 0–40)

## 2018-01-05 MED ORDER — CALCIUM GLUCONATE-NACL 1-0.675 GM/50ML-% IV SOLN
1.0000 g | Freq: Once | INTRAVENOUS | Status: AC
  Administered 2018-01-05: 1000 mg via INTRAVENOUS
  Filled 2018-01-05: qty 50

## 2018-01-05 MED ORDER — MIDAZOLAM HCL 2 MG/2ML IJ SOLN
2.0000 mg | Freq: Once | INTRAMUSCULAR | Status: AC
  Administered 2018-01-05: 2 mg via INTRAVENOUS

## 2018-01-05 MED ORDER — POTASSIUM CHLORIDE 20 MEQ/15ML (10%) PO SOLN
40.0000 meq | ORAL | Status: AC
  Administered 2018-01-05 (×2): 40 meq
  Filled 2018-01-05 (×2): qty 30

## 2018-01-05 MED ORDER — MIDAZOLAM HCL 2 MG/2ML IJ SOLN
INTRAMUSCULAR | Status: AC
  Filled 2018-01-05: qty 4

## 2018-01-05 NOTE — CV Procedure (Signed)
     Transesophageal Echocardiogram Note  Caleb Hubbard [id number] [birth year]  Procedure: Transesophageal Echocardiogram Indications: stroke  Procedure Details Consent: Obtained Time Out: Verified patient identification, verified procedure, site/side was marked, verified correct patient position, special equipment/implants available, Radiology Safety Procedures followed,  medications/allergies/relevent history reviewed, required imaging and test results available.  Performed  Medications: During this procedure the patient is administered a total of Versed 4 mg and Fentanyl 50 mg to achieve and maintain moderate conscious sedation.  The patient's heart rate, blood pressure, and oxygen saturation are monitored continuously during the procedure. The period of conscious sedation is 35 minutes, of which I was present face-to-face 100% of this time.  Left Ventrical:  65-70%, at least moderate LVH  Mitral Valve: normal, no vegetation  Aortic Valve: normal, no vegetation  Tricuspid Valve: mild TR, no vegetation  Pulmonic Valve: trivial PR  Left Atrium/ Left atrial appendage: no thrombus  Atrial septum: No ASD or PFO  Aorta: mild non-mobile atheroma  Complications: No apparent complications Patient did tolerate procedure well.  Ena Dawley, MD, [facility] 01/05/2018, 1:05 PM

## 2018-01-05 NOTE — Progress Notes (Addendum)
CRITICAL VALUE ALERT  Critical Value:  Ca 5.7  Date & Time Notied:  01/05/18 @ 0845  Provider Notified: Leonie Man  Orders Received/Actions taken: Pharmacy Notified

## 2018-01-05 NOTE — Progress Notes (Signed)
OT Cancellation Note  Patient Details Name: Caleb Hubbard MRN: [id number] DOB: Jan 22, 1981   Cancelled Treatment:    Reason Eval/Treat Not Completed: Medical issues which prohibited therapy;Active bedrest order.  Pt sedated on vent.  Lucille Passy, OTR/L Acute Rehabilitation Services Pager [phone] Office [phone]   Lucille Passy M 01/05/2018, 7:05 AM

## 2018-01-05 NOTE — Progress Notes (Signed)
NAMEQuinnlan Hubbard, MRN:  [id number], DOB:  05-Nov-1980, LOS: 2 ADMISSION DATE:  01/03/2018, CONSULTATION DATE:  11/23 REFERRING MD:  Dr. Cheral Marker , CHIEF COMPLAINT:  CVA   Brief History   37 year old male undergoing treatment for testicular cancer admitted 11/23 with brainstem stroke and was left on vent after IR procedure.   Past Medical History  Testicular Ca, hypertension  Significant Hospital Events   11/23 admit for brainstem stroke. IR revascularization. On vent.   Consults:  PCCM IR  Procedures:  11/23 IR cerebral arteriogram with revascularization.  ETT 11/23 >  Significant Diagnostic Tests:  CT head 11/23 > Patchy low-density in the bilateral cerebellum and occipital lobes. This pattern suggests posterior circulation acute infarcts-consider CTA. Given young age and recent chemotherapy underlying PRES is a consideration-although no symmetric cerebral edema. Metastatic disease with edema is possible but less likely given the distribution. 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 No embolic source seen in the neck, although limited by significant motion artifact. MRI Brain 11/23 >> numerous acute supra and infratentorial posterior circulation nonhemorrhagic infarcts, acute basilar artery thrombosis. TEE 11/25 >   Micro Data:  Blood 11/23 >  Antimicrobials:  Periop ceftriaxone  Interim history/subjective:  Follows basic commands.   Objective   Blood pressure 116/82, pulse (!) 112, temperature 98.9 F (37.2 C), temperature source Axillary, resp. rate 15, height 5\' 11"  (1.803 m), weight 67.1 kg, SpO2 100 %.    Vent Mode: PRVC FiO2 (%):  [30 %] 30 % Set Rate:  [15 bmp] 15 bmp Vt Set:  [600 mL] 600 mL PEEP:  [5 cmH20] 5 cmH20 Plateau Pressure:  [15 cmH20-18 cmH20] 15 cmH20   Intake/Output Summary (Last 24 hours) at 01/05/2018 1019 Last data filed at 01/05/2018 1000 Gross per 24 hour  Intake 3334.48 ml    Output 3310 ml  Net 24.48 ml   Filed Weights   01/03/18 1300 01/03/18 1845  Weight: 66 kg 67.1 kg    Examination: General: Well-developed young man, on mechanical ventilation, in NAD HENT: Normocephalic, pupils equal, ET tube in good position Lungs: CTAB Cardiovascular: Regular, no murmur Abdomen: Soft, nondistended, positive bowel sounds Extremities: No edema Neuro: Opens eyes to voice, follows basic commands.  Able to move RUE and RLE.  Not able to move left.   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 S/p IR revascularization 11/23. - Neuro following / managing for stroke. - Continue Aspirin, Brilinta post stenting. - SBP goal 120-140, continue Amlodipine, clonidine, chlorthalidone, labetalol as needed.  Cleviprex now off.  Inability to protect airway in the setting of CVA - Continue full vent support until after TEE. - Will attempt SBT tomorrow 11/26; though note that because this is a brainstem stroke, airway protection could be an issue and pt might ultimately require tracheostomy (would favor trial of extubation over empiric trach.  I have discussed this with pt's mother and father who will discuss amongst themselves today). - VAP prevention order set.  Hypokalemia. - Repletion ordered. - Follow BMP.  Hypocalcemia - corrects to 6.9. - 1g Ca gluconate. -Continue sedation with propofol and wean as able.  Relative leukopenia, thrombocytopenia on CBC 11/24 (new).  Question spurious, no labs drawn 11/25. - Follow CBC; initiate w/u if accurate.  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  Now s/p 1  of 2 planned cycles of cisplatin / etoposide (12/19/17 through 01/01/18 at [facility]). - Follow up at [facility].  Hypertriglyceridemia. - D/c propofol. - Follow trigs, if remains elevated despite d/c propofol then will likely need to  initiate fenofibrate.  Best practice:  Diet: NPO, likely start tube feeding in the next 24 hours if no plans for quick extubation Pain/Anxiety/Delirium protocol (if indicated): Propofol per protocol VAP protocol (if indicated): Per protocol DVT prophylaxis: SCDs / heparin GI prophylaxis: Pepcid Glucose control: n/a Mobility: BR Code Status: Full Family Communication: Family updated at bedside on 11/25. Disposition: ICU   CC time: 30 min.   Montey Hora, [place] [facility] Pager: [phone]  or [phone] 01/05/2018, 10:35 AM

## 2018-01-05 NOTE — Progress Notes (Signed)
PT Cancellation Note  Patient Details Name: Caleb Hubbard MRN: [id number] DOB: 04-16-1980   Cancelled Treatment:    Reason Eval/Treat Not Completed: Medical issues which prohibited therapy(Pt sedated on vent with bedrest)   Caleb Hubbard 01/05/2018, 6:57 AM  Elwyn Reach, PT Acute Rehabilitation Services Pager: [phone] Office: [phone]

## 2018-01-05 NOTE — Progress Notes (Addendum)
STROKE TEAM PROGRESS NOTE   SUBJECTIVE (INTERVAL HISTORY) His RN is at the bedside.  Remains intubated on sedation.  Will follow all simple commands, has left hemiplegia. Had IR, now with stent, on aspirin and Brilinta. Hold extubation for TEE later today.   OBJECTIVE Vitals:   01/05/18 1000 01/05/18 1100 01/05/18 1112 01/05/18 1200  BP: 116/82 122/86 122/86   Pulse: (!) 112 (!) 107 (!) 111   Resp: 15 15 15    Temp:    98.9 F (37.2 C)  TempSrc:    Axillary  SpO2: 100% 100% 100%   Weight:      Height:        CBC:  Recent Labs  Lab 01/03/18 1120 01/04/18 0346  WBC 15.1* 3.6*  NEUTROABS 13.1* 3.3  HGB 12.0* 19.1*  HCT 35.7* 54.2*  MCV 96.5 93.9  PLT 298 137*    Basic Metabolic Panel:  Recent Labs  Lab 01/03/18 1120 01/04/18 0346 01/05/18 0500  NA 140 132* 136  K 3.4* 3.8 2.8*  CL 100 101 108  CO2 26 22 15*  GLUCOSE 103* 113* 102*  BUN 21* 17 9  CREATININE 1.01 0.50* 0.85  CALCIUM 9.7 8.2* 5.7*  MG 1.8  --   --   PHOS 2.9  --  4.1    Lipid Panel:     Component Value Date/Time   CHOL 204 (H) 01/05/2018 0500   TRIG 1,449 (H) 01/05/2018 0500   HDL NOT REPORTED DUE TO HIGH TRIGLYCERIDES 01/05/2018 0500   CHOLHDL NOT REPORTED DUE TO HIGH TRIGLYCERIDES 01/05/2018 0500   VLDL UNABLE TO CALCULATE IF TRIGLYCERIDE OVER 400 mg/dL 01/05/2018 0500   LDLCALC UNABLE TO CALCULATE IF TRIGLYCERIDE OVER 400 mg/dL 01/05/2018 0500   HgbA1c:  Lab Results  Component Value Date   HGBA1C 6.4 (H) 01/04/2018   Urine Drug Screen:     Component Value Date/Time   LABOPIA NONE DETECTED 01/04/2018 0833   COCAINSCRNUR NONE DETECTED 01/04/2018 0833   LABBENZ NONE DETECTED 01/04/2018 0833   AMPHETMU NONE DETECTED 01/04/2018 0833   THCU POSITIVE (A) 01/04/2018 0833   LABBARB NONE DETECTED 01/04/2018 0833    Alcohol Level No results found for: ETH  IMAGING Ct Head Wo Contrast 01/03/2018 Patchy low-density in the bilateral cerebellum and occipital lobes. This pattern suggests  posterior circulation acute infarcts-consider CTA. Given young age and recent chemotherapy underlying PRES is a consideration-although no symmetric cerebral edema. Metastatic disease with edema is possible but less likely given the distribution.   Ct Angio Head W Or Wo Contrast Ct Angio Neck W And/or Wo Contrast 01/03/2018 1. 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  2. No embolic source seen in the neck, although limited by significant motion artifact.   Mr Brain Wo Contrast 01/03/2018 1. Acute numerous supra- and infratentorial posterior circulation nonhemorrhagic infarcts.  2. 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   Transthoracic Echocardiogram - pending  Bilateral Lower Extremity Venous Dopplers There is no DVT or SVT noted in the bilateral lower extremities.   TEE pending  PHYSICAL EXAM  young African-American gentleman who is intubated and sedated. . Afebrile. Head is nontraumatic. Neck is supple without bruit.    Cardiac exam no murmur or gallop. Lungs are clear to auscultation. Distal pulses are well felt. Neurological Exam :  Drowsy but can be aroused.able to open eyes briefly. Does follow with gaze but with limitation of abduction in the right eye and adduction in the left eye. No nystagmus.and moves right-sided side purposefully against gravity.dense left hemiplegia but can  move her left leg   only trace withdrawal  Sensation, coordination and gait cannot be tested reliably. ASSESSMENT/PLAN [name] is a 37 y.o.  male with history of renal insufficiency and testicular cancer with lymph node metastasis who presented to the [facility] ED this morning with AMS, rigors and dystonic posturing. He did not receive IV t-PA due to unknown time of onset.  Stroke: bilateral cerebellar, brainstem and occipital infarcts due to BA occlusion s/p IR with TICI2b reperfusion and rescue stenting - embolic - source unclear, could be Paradoxical emboli  CT head - Patchy low-density in the bilateral cerebellum and occipital lobes.  MRI head - Acute numerous supra - and infratentorial posterior circulation nonhemorrhagic infarcts.   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  2D Echo -Left ventricle: The cavity size was normal. Wall thickness was increased in a pattern of moderate LVH. There was concentric hypertrophy. Systolicfunction was vigorous. The estimated ejection fraction was in the range of 65% to 70%. Wall motion was normal; there were no regional wall motion abnormalities.  LE Dopplers negative  LDL - unable to calculate due to TG 1,966 on Clevipex and propofol. Repeat TG 1449  HgbA1c - 6.4  EEG - pending - hold for now  TEE today or tomorrow, prior to extubation, to rule out PFO  UDS - THC positive  VTE prophylaxis - heparin subq, d/c SCDs  Diet - NPO  No antithrombotic prior to admission, now on aspirin 81 mg daily and Brilinta 90 mg BID given stent   Therapy recommendations:  pending  Disposition:  Pending  D/c central line  D/c foley  Right testicular tumor  Followed by Dr. Ron Agee w/ oncology  With lymph node metastasis  S/p orchidectomy and currently on chemo with cisplatin and etoposide with 1 more dose due in Dec  Dr. Jana Hakim does not feel likely to be hypercoagulable state from testicular tumor or from cistplatin / etoposide - he recommended continued embolic workup  Acute respiratory failure  Intubated on sedation for airway  protection  CCM on board  Hold extubation until TEE completed then wean off ventilation as able  Hypertension  Stable . On cleviprex . Increase SBP goal 110-180 . on multiple BP meds including amlodipine, chlorthalidone, clonidine  . Wean off cleviprex as able . Long-term BP goal normotensive  Hyperlipidemia  Lipid lowering medication PTA:  none  LDL unable to calculate due to high TG, goal < 70  TG 1966 likely contamination from propofol and cleviprex  Repeat lipid 1449   Current lipid lowering medication: none  Add statin once able to swallow  Tobacco abuse  Current cigar smoker  Smoking cessation counseling will be provided  Leukocytosis with low grade fever  WBC 15.1->3.6 -> 3.8  monitor  Tmax 100.5  UA pending  CXR no active disease  Received 1-2 doses of vanco, rocephin and acyclovir - all stopped  Other Stroke Risk Factors  THC user - UDS positive for  THC  Other Active Problems  Hemoglobin 12.0->19.1->10.9  Hyponatremia Na - 132->136  Hypokalemia 2.8 - replaced  Hypocalcemia  Hospital day # 2  Burnetta Sabin, MSN, APRN, ANVP-BC, AGPCNP-BC Advanced Practice Stroke Nurse [place] for Schedule & Pager information 01/05/2018 2:45 PM   I have personally examined this patient, reviewed notes, independently viewed imaging studies, participated in medical decision making and plan of care.ROS completed by me personally and pertinent positives fully documented  I have made any additions or clarifications directly to the above note. Agree with note above.  Recommend TEE today and then consider weaning of sedation and extubation if tolerated.CCM team to address hypocalcemia and hypokalemia.Discussed with nurse and critical care team.This patient is critically ill and at significant risk of neurological worsening, death and care requires constant monitoring of vital signs, hemodynamics,respiratory and cardiac monitoring, extensive  review of multiple databases, frequent neurological assessment, discussion with family, other specialists and medical decision making of high complexity.I have made any additions or clarifications directly to the above note.This critical care time does not reflect procedure time, or teaching time or supervisory time of PA/NP/Med Resident etc but could involve care discussion time.  I spent 35 minutes of neurocritical care time  in the care of  this patient.      Antony Contras, MD Medical Director [facility] Pager: [phone] 01/05/2018 3:00 PM    To contact Stroke Continuity provider, please refer to [email or web address] After hours, contact General Neurology

## 2018-01-05 NOTE — Progress Notes (Signed)
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 Estanislado Pandy.  Patient laying in bed intubated and sedated but opens eyes to name and follows simple commands. Can spontaneously move right side and LUE, no spontaneous movements of LLE. Left groin incision c/d/i.   Allergies: Patient has no known allergies.  Medications: Prior to Admission medications   Medication Sig Start Date End Date Taking? Authorizing Provider  amLODipine (NORVASC) 10 MG tablet Take 10 mg by mouth daily. 12/29/17 12/29/18 Yes [provider]  chlorthalidone (HYGROTON) 25 MG tablet Take 25 mg by mouth daily. 11/20/17  Yes [provider]  cloNIDine (CATAPRES) 0.2 MG tablet Take 0.2 mg by mouth 2 (two) times daily.   Yes [provider]  dexamethasone (DECADRON) 4 MG tablet Take 4 mg by mouth See admin instructions. Take 2 tablets by mouth once daily for 3 days start the day after chemotherapy and continue for 3 days 11/26/17   [provider]  OLANZapine (ZYPREXA) 10 MG tablet Take 10 mg by mouth See admin instructions. Take 1 tablet by mouth nightly for 3 doses start the day after chemotherapy and continue for 3 days 11/26/17   [provider]  ondansetron (ZOFRAN) 8 MG tablet Take 8 mg by mouth every 8 (eight) hours as needed for nausea/vomiting. 11/26/17   [provider]  prochlorperazine (COMPAZINE) 10 MG tablet Take 10 mg by mouth every 6 (six) hours as needed for nausea/vomiting. 11/26/17   [provider]     Vital Signs: BP 126/89   Pulse (!) 115   Temp 98.9 F (37.2 C) (Axillary)   Resp 15   Ht 5\' 11"  (1.803 m)   Wt 147 lb 14.9 oz (67.1 kg)   SpO2 100%   BMI 20.63  kg/m   Physical Exam  Constitutional: He appears well-developed and well-nourished. No distress.  Intubated and sedated.  Pulmonary/Chest: Effort normal. No respiratory distress.  Intubated and sedated.  Neurological:  Intubated and sedated but opens eyes to voice and able to follow simple commands. PERRL bilaterally. EOMs not assessed. Visual fields not assessed. Facial asymmetry not assessed. Tongue protrusion not assessed. Can spontaneously move right side and LUE but no spontaneous movements of LLE. Pronator drift not assessed. Fine motor and coordination not assessed. Gait not assessed. Romberg not assessed. Heel to toe not assessed. Distal pulses 2+ bilaterally.  Skin: Skin is warm and dry.  Left groin incision soft without active bleeding or hematoma.  Psychiatric:  Intubated and sedated.  Nursing note and vitals reviewed.   Imaging: Ct Angio Head W Or Wo Contrast  Result Date: 01/03/2018 CLINICAL DATA:  Altered mental status. EXAM: CT ANGIOGRAPHY HEAD AND NECK TECHNIQUE: Multidetector CT imaging of the head and neck was performed using the standard protocol during bolus administration of intravenous contrast. Multiplanar CT image reconstructions and MIPs were obtained to evaluate the vascular anatomy. Carotid stenosis measurements (when applicable) are obtained utilizing NASCET criteria, using the distal internal carotid diameter as the denominator. CONTRAST:  13mL ISOVUE-370 IOPAMIDOL (ISOVUE-370) INJECTION 76% COMPARISON:  Noncontrast head CT earlier today FINDINGS: CTA NECK FINDINGS Aortic arch: Normal.  Three vessel branching. Right carotid system: Limited by motion but no stenosis, ulceration, or dissection. Left carotid system: Limited by motion but no stenosis, ulceration,  or dissection. There is mild atherosclerotic plaque at the ICA bulb. Vertebral arteries: No proximal subclavian stenosis or atherosclerosis. Both vertebral arteries are motion degraded but where not  distorted they are widely patent. Skeleton: Accounting for motion there is no acute finding Other neck: Nasal trumpet in place. Upper chest: Negative Review of the MIP images confirms the above findings CTA HEAD FINDINGS Anterior circulation: Limited by motion. No major branch occlusion flow limiting stenosis is seen. No gross aneurysm. Posterior circulation: Mild right vertebral artery dominance. Occluded basilar at the vertebrobasilar junction with reconstitution at the terminus. Venous sinuses: Hypoplastic on the right. The dural venous sinuses are patent. Anatomic variants: Negative for aneurysm. Delayed phase: Patient's acute infarcts are nonenhancing. Critical Value/emergent results were called by telephone at the time of interpretation on 01/03/2018 at 1:20 pm to Dr. Charlesetta Shanks , who verbally acknowledged these results. Review of the MIP images confirms the above findings IMPRESSION: 1. 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 2. No embolic source seen in the neck, although limited by significant motion artifact. Electronically Signed   By: Monte Fantasia M.D.   On: 01/03/2018 13:27   Ct Head Wo Contrast  Result Date: 01/03/2018 CLINICAL DATA:  Altered level of consciousness. Seizure-like activity EXAM: CT HEAD WITHOUT CONTRAST TECHNIQUE: Contiguous axial images were obtained from the base of the skull through the vertex without intravenous contrast. COMPARISON:  None. FINDINGS: Brain: Patchy low-density in the bilateral cerebellum and occipital lobes. No low-density seen in the anterior circulation distribution. No hemorrhage, hydrocephalus, or mass effect. Vascular: No hyperdense vessel, including the basilar Skull: Negative Sinuses/Orbits: Negative Other: Critical Value/emergent results were called by telephone at the time of interpretation on 01/03/2018 at 12:43 pm to Dr. Jeannie Done PFEIFFER , who verbally acknowledged these results. IMPRESSION: Patchy low-density in  the bilateral cerebellum and occipital lobes. This pattern suggests posterior circulation acute infarcts-consider CTA. Given young age and recent chemotherapy underlying PRES is a consideration-although no symmetric cerebral edema. Metastatic disease with edema is possible but less likely given the distribution. Electronically Signed   By: Monte Fantasia M.D.   On: 01/03/2018 12:45   Ct Angio Neck W And/or Wo Contrast  Result Date: 01/03/2018 CLINICAL DATA:  Altered mental status. EXAM: CT ANGIOGRAPHY HEAD AND NECK TECHNIQUE: Multidetector CT imaging of the head and neck was performed using the standard protocol during bolus administration of intravenous contrast. Multiplanar CT image reconstructions and MIPs were obtained to evaluate the vascular anatomy. Carotid stenosis measurements (when applicable) are obtained utilizing NASCET criteria, using the distal internal carotid diameter as the denominator. CONTRAST:  175mL ISOVUE-370 IOPAMIDOL (ISOVUE-370) INJECTION 76% COMPARISON:  Noncontrast head CT earlier today FINDINGS: CTA NECK FINDINGS Aortic arch: Normal.  Three vessel branching. Right carotid system: Limited by motion but no stenosis, ulceration, or dissection. Left carotid system: Limited by motion but no stenosis, ulceration, or dissection. There is mild atherosclerotic plaque at the ICA bulb. Vertebral arteries: No proximal subclavian stenosis or atherosclerosis. Both vertebral arteries are motion degraded but where not distorted they are widely patent. Skeleton: Accounting for motion there is no acute finding Other neck: Nasal trumpet in place. Upper chest: Negative Review of the MIP images confirms the above findings CTA HEAD FINDINGS Anterior circulation: Limited by motion. No major branch occlusion flow limiting stenosis is seen. No gross aneurysm. Posterior circulation: Mild right vertebral artery dominance. Occluded basilar at the vertebrobasilar junction with reconstitution at the terminus.  Venous sinuses: Hypoplastic on the right. The dural  venous sinuses are patent. Anatomic variants: Negative for aneurysm. Delayed phase: Patient's acute infarcts are nonenhancing. Critical Value/emergent results were called by telephone at the time of interpretation on 01/03/2018 at 1:20 pm to Dr. Charlesetta Shanks , who verbally acknowledged these results. Review of the MIP images confirms the above findings IMPRESSION: 1. 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 2. No embolic source seen in the neck, although limited by significant motion artifact. Electronically Signed   By: Monte Fantasia M.D.   On: 01/03/2018 13:27   Mr Brain Wo Contrast  Result Date: 01/03/2018 CLINICAL DATA:  Follow-up basilar artery thrombosis and stroke. History of testicular cancer. EXAM: MRI HEAD WITHOUT CONTRAST TECHNIQUE: Axial diffusion weighted imaging, axial T2 FLAIR and axial T2. COMPARISON:  CT HEAD and CT angiogram head and neck January 03, 2018 FINDINGS: Brain: Patchy to confluent reduced diffusion bilateral cerebellum, confluent reduced diffusion within the lower pons. Confluent LEFT greater than RIGHT reduced diffusion bilateral occipital lobes, patchy thalami and RIGHT splenium of corpus callosum reduced diffusion. Subcentimeter areas of reduced diffusion bilateral posterior temporal lobes. All areas of reduced diffusion demonstrate low ADC values with mild T2 hyperintense cytotoxic edema. Patent fourth ventricle, no hydrocephalus. No midline shift. No abnormal extra-axial fluid collections. Vascular: Intermediate signal vertebral arteries and basilar artery. Skull and upper cervical spine: Not tailored for evaluation, non suspicious. Sinuses/Orbits: Mild paranasal sinus mucosal thickening. RIGHT mastoid effusion. Other: None. IMPRESSION: 1. Acute numerous supra- and infratentorial posterior circulation nonhemorrhagic infarcts. 2. 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  3. Critical Value/emergent results text paged to Dr.ERIC [facility] via AMION secure system on 01/03/2018 at 3:50 pm, including interpreting physician's phone number. Electronically Signed   By: Elon Alas M.D.   On: 01/03/2018 16:01   Dg Chest Port 1 View  Result Date: 01/03/2018 CLINICAL DATA:  Acute basilar artery thrombus with resulting cerebellar, occipital, and brainstem infarcts. Basilar stent placement. EXAM: PORTABLE CHEST 1 VIEW COMPARISON:  01/03/2018 FINDINGS: The endotracheal tube is satisfactorily position with tip 4.5 cm above the carina. Nasogastric tube enters the stomach with side port in the stomach body. The lungs appear clear. Heart size within normal limits. Mediastinal contours normal. IMPRESSION: 1. Endotracheal and nasogastric tubes appear satisfactorily position. The lungs appear clear. Electronically Signed   By: Van Clines M.D.   On: 01/03/2018 19:35   Dg Chest Portable 1 View  Result Date: 01/03/2018 CLINICAL DATA:  Intubation, history testicular cancer, smoker, hypertension EXAM: PORTABLE CHEST 1 VIEW COMPARISON:  Portable exam 1354 hours compared to 1140 hours FINDINGS: Endotracheal tube with tip projecting 19 mm above carina. Nasogastric tube extends into stomach. Normal heart size, mediastinal contours, and pulmonary vascularity. Tips of lung apices excluded. Visualized lungs clear. No infiltrate, pleural effusion or gross pneumothorax. IMPRESSION: Tube positions as above. No acute abnormalities. Electronically Signed   By: Lavonia Dana M.D.   On: 01/03/2018 14:27   Dg Chest Port 1 View  Result Date: 01/03/2018 CLINICAL DATA:  Nausea since first chemotherapy treatment this week, smoker, testicular cancer EXAM: PORTABLE CHEST 1 VIEW COMPARISON:  Portable exam 1140 hours compared to 08/07/2016 FINDINGS: Normal heart size, mediastinal contours, and pulmonary vascularity. Lungs clear. No pleural effusion or pneumothorax. Bones unremarkable. IMPRESSION: Normal  exam. Electronically Signed   By: Lavonia Dana M.D.   On: 01/03/2018 11:58    Labs:  CBC: Recent Labs    05/15/17 1019 05/27/17 1017 01/03/18 1120 01/04/18 0346  WBC 13.0* 15.7* 15.1* 3.6*  HGB 15.1 14.0 12.0* 19.1*  HCT 44.3 40.5 35.7* 54.2*  PLT 348 328 298 137*    COAGS: Recent Labs    01/03/18 1120  INR 1.16  APTT 23*    BMP: Recent Labs    05/15/17 1019 05/27/17 1017 01/03/18 1120 01/04/18 0346  NA 138 139 140 132*  K 3.9 3.5 3.4* 3.8  CL 104 105 100 101  CO2 25 23 26 22   GLUCOSE 123* 120* 103* 113*  BUN 9 9 21* 17  CALCIUM 9.9 9.4 9.7 8.2*  CREATININE 0.96 0.90 1.01 0.50*  GFRNONAA >60 >60 >60 >60  GFRAA >60 >60 >60 >60    LIVER FUNCTION TESTS: Recent Labs    05/15/17 1019 01/03/18 1120  BILITOT 0.7 1.0  AST 17 20  ALT 10* 12  ALKPHOS 109 63  PROT 8.2* 7.1  ALBUMIN 4.4 4.0    Assessment and Plan:  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 Estanislado Pandy.  Patient's condition improving- remains intubated/sedated but opens eyes to voice and follows simple commands, can spontaneously move right side and LUE but no spontaneous movements of LLE. Left groin incision stable. Continue taking Brilinta 90 mg twice daily and Aspirin 81 mg once daily. Appreciate and agree with neurology management. IR to follow.   Electronically Signed: Earley Abide, PA-C 01/05/2018, 8:36 AM   I spent a total of 15 Minutes at the the patient's bedside AND on the patient's hospital floor or unit, greater than 50% of which was counseling/coordinating care for basilar artery occlusion s/p revascularization.

## 2018-01-06 ENCOUNTER — Encounter (HOSPITAL_COMMUNITY): Payer: Self-pay | Admitting: Interventional Radiology

## 2018-01-06 LAB — CBC WITH DIFFERENTIAL/PLATELET
Abs Immature Granulocytes: 0 10*3/uL (ref 0.00–0.07)
Basophils Absolute: 0 10*3/uL (ref 0.0–0.1)
Basophils Relative: 0 %
EOS ABS: 0 10*3/uL (ref 0.0–0.5)
EOS PCT: 1 %
HEMATOCRIT: 26.8 % — AB (ref 39.0–52.0)
Hemoglobin: 9.2 g/dL — ABNORMAL LOW (ref 13.0–17.0)
Immature Granulocytes: 0 %
LYMPHS ABS: 0.4 10*3/uL — AB (ref 0.7–4.0)
Lymphocytes Relative: 45 %
MCH: 30.8 pg (ref 26.0–34.0)
MCHC: 34.3 g/dL (ref 30.0–36.0)
MCV: 89.6 fL (ref 80.0–100.0)
MONOS PCT: 2 %
Monocytes Absolute: 0 10*3/uL — ABNORMAL LOW (ref 0.1–1.0)
NEUTROS PCT: 52 %
NRBC: 0 % (ref 0.0–0.2)
Neutro Abs: 0.5 10*3/uL — ABNORMAL LOW (ref 1.7–7.7)
Platelets: 150 10*3/uL (ref 150–400)
RBC: 2.99 MIL/uL — ABNORMAL LOW (ref 4.22–5.81)
RDW: 13.9 % (ref 11.5–15.5)
WBC: 0.9 10*3/uL — CL (ref 4.0–10.5)

## 2018-01-06 LAB — RENAL FUNCTION PANEL
ANION GAP: 10 (ref 5–15)
Albumin: 2.5 g/dL — ABNORMAL LOW (ref 3.5–5.0)
BUN: 16 mg/dL (ref 6–20)
CO2: 21 mmol/L — AB (ref 22–32)
Calcium: 8.1 mg/dL — ABNORMAL LOW (ref 8.9–10.3)
Chloride: 103 mmol/L (ref 98–111)
Creatinine, Ser: 1.15 mg/dL (ref 0.61–1.24)
GFR calc non Af Amer: >60 mL/min (ref >60)
GLUCOSE: 114 mg/dL — AB (ref 70–99)
PHOSPHORUS: 3.6 mg/dL (ref 2.5–4.6)
POTASSIUM: 3.5 mmol/L (ref 3.5–5.1)
Sodium: 134 mmol/L — ABNORMAL LOW (ref 135–145)

## 2018-01-06 LAB — MAGNESIUM: Magnesium: 1.6 mg/dL — ABNORMAL LOW (ref 1.7–2.4)

## 2018-01-06 LAB — PATHOLOGIST SMEAR REVIEW

## 2018-01-06 MED ORDER — VITAL AF 1.2 CAL PO LIQD: 1000.0000 mL | ORAL | Status: DC

## 2018-01-06 MED ORDER — MIDAZOLAM HCL 2 MG/2ML IJ SOLN: 2.0000 mg | INTRAMUSCULAR | Status: DC | PRN

## 2018-01-06 MED ORDER — PRO-STAT SUGAR FREE PO LIQD
30.0000 mL | Freq: Every day | ORAL | Status: DC
  Administered 2018-01-06 – 2018-01-10 (×5): 30 mL
  Filled 2018-01-06 (×5): qty 30

## 2018-01-06 MED ORDER — PANTOPRAZOLE SODIUM 40 MG PO PACK
40.0000 mg | PACK | ORAL | Status: DC
  Administered 2018-01-06 – 2018-01-12 (×7): 40 mg
  Filled 2018-01-06 (×7): qty 20

## 2018-01-06 MED ORDER — FENTANYL CITRATE (PF) 100 MCG/2ML IJ SOLN: 50.0000 ug | INTRAMUSCULAR | Status: DC | PRN

## 2018-01-06 MED ORDER — TBO-FILGRASTIM 300 MCG/0.5ML ~~LOC~~ SOSY
300.0000 ug | PREFILLED_SYRINGE | Freq: Once | SUBCUTANEOUS | Status: AC
  Administered 2018-01-06: 300 ug via SUBCUTANEOUS
  Filled 2018-01-06: qty 0.5

## 2018-01-06 NOTE — Procedures (Signed)
Extubation Procedure Note  Patient Details:   Name: Caleb Hubbard DOB: [birth year] MRN: [id number]   Airway Documentation:    Vent end date: 01/06/18 Vent end time: 1359   Evaluation  O2 sats: stable throughout Complications: No apparent complications Patient did tolerate procedure well. Bilateral Breath Sounds: Clear, Diminished   Yes  Carson Myrtle 01/06/2018, 1:59 PM

## 2018-01-06 NOTE — Progress Notes (Addendum)
OT EVALUATION:   Clinical Impression:  Patient supine in bed and agreeable to OT/PT (vented but shakes head yes).  Patient admitted with below and limited by problem list below, including impaired balance, L hemiparesis, and decreased activity tolerance. Patient maintains eyes closed majority of session, but able to open intermittently with cueing.  Patient able to follow simple 1 step commands using R UE, able to answer questions by small nods and thumbs up.  Requires +2 assist max- total for all self care and bed mobility at this time. Further assessment on cognition, vision, and inattention will need to be completed. PTA his mother reports he was independent and living with his girlfriend, unsure of dc disposition but his parents are interested in him moving back home.  Patient will benefit from continued OT services while admitted and after dc at CIR level in order to optimize return to PLOF.  Will continue to follow and update recommendations as needed.     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 Presents on 11/23 with nausea, altered mental status, dystonic posturing.  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   Vented 11/23.   Precautions  Precautions Fall  Restrictions  Weight Bearing Restrictions No  Home Living  Family/patient expects to be discharged to: Private residence  Living Arrangements Parent  Available Help at Discharge Family   Type of [place] to enter  Entrance Stairs-Number of Steps 3  Entrance Stairs-Rails None  Home Layout Two level;Able to live on main level with bedroom/bathroom  Barrister's clerk None  Additional Comments lives with significant other, but home setup is for his parents house in [place] as his mother would like to take him home.  (information provided from mother )  Prior Function  Level of Independence Independent  Comments driving, not working   Engineer, petroleum Other (comment) (intubated )  Pain Assessment  Pain Assessment Faces  Faces Pain Scale 2  Pain Location generalized discomfort   Pain Descriptors / Indicators Discomfort;Grimacing  Pain Intervention(s) Monitored during session;Repositioned  Cognition  Arousal/Alertness Lethargic  Behavior During Therapy Flat affect  Overall Cognitive Status Impaired/Different from baseline  Area of Impairment Following commands  Following Commands Follows one step commands inconsistently;Follows one step commands with increased time  General Comments Pt able to follow 1 step commands with increased time, slow processing noted.  Difficult to assess due to Intubated;Level of arousal  Upper Extremity Assessment  Upper Extremity Assessment LUE deficits/detail;RUE deficits/detail  RUE Deficits / Details WFL   LUE Deficits / Details flaccid, non functional, no initation but able to give thumbs up when asked if touched   LUE Sensation WNL  LUE Coordination decreased fine motor;decreased gross motor  Lower Extremity Assessment  Lower Extremity Assessment Defer to PT evaluation  Cervical / Trunk Assessment  Cervical / Trunk Assessment  (forward head and R head turn --due to intubation tubes )  ADL  Overall ADL's  Needs  assistance/impaired  Eating/Feeding NPO  Grooming Total assistance;Bed level  Upper Body Bathing Total assistance;Sitting   Lower Body Bathing Total assistance;+2 for physical assistance;Bed level  Upper Body Dressing  Total assistance;Sitting  Lower Body Dressing Total assistance;+2 for physical assistance;Bed level  Toilet Transfer Details (indicate cue type and reason) deferred due to safety  Toileting- Clothing Manipulation and Hygiene Total assistance;Bed level;+2 for physical assistance  Functional mobility during ADLs Total assistance;+2 for physical assistance  General ADL Comments pt requires total assist +2 for bed mobility and sitting EOB, patient able to assist with use of R UE at times but fatigues easily   Vision- Assessment  Additional Comments further assessment needed, maintains eyes closed during majority of session with intermittent opening of eyes  Bed Mobility  Overal bed mobility Needs Assistance  Bed Mobility Supine to Sit;Sit to Supine  Supine to sit +2 for physical assistance;HOB elevated;Max assist  Sit to supine +2 for physical assistance;HOB elevated;Max assist  General bed mobility comments Patient able to follow some commands given increased time to initate movement with R UE/LE, overall requires total assist to transition to/from sitting EOB  Transfers  General transfer comment deferred due to safety  Balance  Overall balance assessment Needs assistance  Sitting-balance support Bilateral upper extremity supported;Feet supported  Sitting balance-Leahy Scale Poor  Sitting balance - Comments static sitting EOB with max assist, intermittent spontaneous postural muscular control but fatigues easily   Postural control Posterior lean  General Comments  General comments (skin integrity, edema, etc.) Pt intubated: pt on ventilater at 100% SPO2 FIO2 at 30%; noted increased secretions during mobility  OT - End of Session  Activity Tolerance Patient tolerated treatment well  Patient left in bed;with call bell/phone within reach;with bed alarm set;with nursing/sitter in room (in chair  position)  Nurse Communication Mobility status  OT Assessment  OT Recommendation/Assessment Patient needs continued OT Services  OT Visit Diagnosis Other abnormalities of gait and mobility (R26.89);Hemiplegia and hemiparesis  Hemiplegia - Right/Left Left  Hemiplegia - caused by Cerebral infarction  OT Problem List Decreased strength;Decreased activity tolerance;Impaired balance (sitting and/or standing);Impaired vision/perception;Decreased coordination;Decreased cognition;Decreased safety awareness;Decreased knowledge of precautions;Cardiopulmonary status limiting activity;Impaired tone;Impaired UE functional use;Pain  OT Plan  OT Frequency (ACUTE ONLY) Min 2X/week  OT Treatment/Interventions (ACUTE ONLY) Self-care/ADL training;Therapeutic exercise;Neuromuscular education;Energy conservation;DME and/or AE instruction;Manual therapy;Therapeutic activities;Cognitive remediation/compensation;Visual/perceptual remediation/compensation;Patient/family education;Balance training;Splinting  AM-PAC OT "6 Clicks" Daily Activity Outcome Measure (Version 2)  Help from another person eating meals? 1  Help from another person taking care of personal grooming? 1  Help from another person toileting, which includes using toliet, bedpan, or urinal? 1  Help from another person bathing (including washing, rinsing, drying)? 1  Help from another person to put on and taking off regular upper body clothing? 1  Help from another person to put on and taking off regular lower body clothing? 1  6 Click Score 6  OT Recommendation  Recommendations for Other Services Rehab consult  Follow Up Recommendations CIR  OT Equipment Other (comment) (TBD at next venue of care)  Individuals Consulted  Consulted and Agree with Results and Recommendations Patient unable/family or caregiver not available  Acute Rehab OT Goals  Patient Stated Goal none stated  OT Goal Formulation Patient unable to participate in goal setting  Time  For Goal Achievement 01/20/18  Potential to Achieve Goals Fair  OT Time Calculation  OT Start Time (ACUTE ONLY) 1120  OT Stop Time (ACUTE ONLY) 1158  OT Time Calculation (min) 38  min  OT General Charges  $OT Visit 1 Visit  OT Evaluation  $OT Eval High Complexity 1 High    Delight Stare, [place] Acute [facility] Services Pager [phone] Office [phone]

## 2018-01-06 NOTE — Progress Notes (Addendum)
Rehab Admissions Coordinator Note:  Patient was screened by Cleatrice Burke for appropriateness for an Inpatient Acute Rehab Consult.  At this time, we are recommending Inpatient Rehab consult once extubated .  Cleatrice Burke 01/06/2018, 1:50 PM  I can be reached at [phone].

## 2018-01-06 NOTE — Progress Notes (Signed)
   NAMEKule Hubbard, MRN:  [id number], DOB:  April 23, 1980, LOS: 3 ADMISSION DATE:  01/03/2018, CONSULTATION DATE:  11/23 REFERRING MD:  Dr. Cheral Marker , CHIEF COMPLAINT:  CVA   Brief History   37 yo male with nausea, altered mental status, dystonic posturing.  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  Remained on vent after procedure.  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  Past Medical History  HTN, Depression  Significant Hospital Events   11/23 admit for brainstem stroke. IR revascularization. On vent.   Consults:  Neuro IR Neuro  Procedures:  11/23 IR cerebral arteriogram with revascularization.  ETT 11/23 >  Significant Diagnostic Tests:  CT head 11/23 > Patchy low-density in the bilateral cerebellum and occipital lobes. This pattern suggests posterior circulation acute infarcts-consider CTA. Given young age and recent chemotherapy underlying PRES is a consideration-although no symmetric cerebral edema. Metastatic disease with edema is possible but less likely given the distribution. 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 No embolic source seen in the neck, although limited by significant motion artifact. MRI Brain 11/23 >> numerous acute supra and infratentorial posterior circulation nonhemorrhagic infarcts, acute basilar artery thrombosis. TEE 11/25 > EF 65 to 70%, mod LVH, no ASD or PFO  Micro Data:  Blood 11/23 >  Antimicrobials:    Interim history/subjective:  Remained on sedation overnight.  Objective   Blood pressure 113/86, pulse (!) 102, temperature 98.7 F (37.1 C), temperature source Axillary, resp. rate 16, height 5\' 11"  (1.803 m), weight 67.1 kg, SpO2 100 %.    Vent Mode: PRVC FiO2 (%):  [30  %] 30 % Set Rate:  [15 bmp] 15 bmp Vt Set:  [600 mL] 600 mL PEEP:  [5 cmH20] 5 cmH20 Plateau Pressure:  [17 cmH20-26 cmH20] 18 cmH20   Intake/Output Summary (Last 24 hours) at 01/06/2018 6948 Last data filed at 01/06/2018 0600 Gross per 24 hour  Intake 1935.85 ml  Output 1100 ml  Net 835.85 ml   Filed Weights   01/03/18 1300 01/03/18 1845  Weight: 66 kg 67.1 kg    Examination:  General - sedated Eyes - pupils reactive ENT - ETT in place Cardiac - regular rate/rhythm, no murmur Chest - equal breath sounds b/l, no wheezing or rales Abdomen - soft, non tender, + bowel sounds Extremities - no cyanosis, clubbing, or edema Skin - no rashes Neuro - has movement on Rt side   Assessment & Plan:   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 Plan - continue ASA, brilinta  Acute respiratory failure with compromised airway. Plan - limit sedation and try to push vent weaning - family would want reintubation and trach if he fails extubation trial  Hypertension. Plan - goal SBP 110 to 180 - continue norvasc, chlorthalidone, catapres  Chemotherapy induced leukopenia. Plan - f/u CBC with diff - neutropenic precautions  Hypokalemia. Plan - f/u BMET  Hypertriglyceridemia from diprivan and cleviprex (off both). Plan - f/u triglyceride levels   Best practice:  Diet: tube feeds DVT prophylaxis: SCDs / heparin GI prophylaxis: Protonix Mobility: BR Code Status: Full Family Communication: updated pt's mother at bedside  CC time 7 minutes  Chesley Mires, MD [place] 01/06/2018, 8:51 AM

## 2018-01-06 NOTE — Progress Notes (Addendum)
STROKE TEAM PROGRESS NOTE   SUBJECTIVE (INTERVAL HISTORY) His wife is at the bedside. WBC down. On neutropenic precautions. Oncology at [facility] Dr Vito Berger called and recommends neupogen.   OBJECTIVE Vitals:   01/06/18 0600 01/06/18 0700 01/06/18 0800 01/06/18 0801  BP: 106/82 113/85  113/86  Pulse: 88 96 99 (!) 102  Resp: 15 15 15 16   Temp:    99.1 F (37.3 C)  TempSrc:    Axillary  SpO2: 100% 100% 100% 100%  Weight:      Height:        CBC:  Recent Labs  Lab 01/05/18 1105 01/06/18 0535  WBC 3.8* 0.9*  NEUTROABS 3.2 0.5*  HGB 10.9* 9.2*  HCT 30.2* 26.8*  MCV 90.4 89.6  PLT 204 220    Basic Metabolic Panel:  Recent Labs  Lab 01/03/18 1120  01/05/18 0500 01/06/18 0535 01/06/18 0851  NA 140   < > 136 134*  --   K 3.4*   < > 2.8* 3.5  --   CL 100   < > 108 103  --   CO2 26   < > 15* 21*  --   GLUCOSE 103*   < > 102* 114*  --   BUN 21*   < > 9 16  --   CREATININE 1.01   < > 0.85 1.15  --   CALCIUM 9.7   < > 5.7* 8.1*  --   MG 1.8  --   --   --  1.6*  PHOS 2.9  --  4.1 3.6  --    < > = values in this interval not displayed.    Lipid Panel:     Component Value Date/Time   CHOL 204 (H) 01/05/2018 0500   TRIG 1,449 (H) 01/05/2018 0500   HDL NOT REPORTED DUE TO HIGH TRIGLYCERIDES 01/05/2018 0500   CHOLHDL NOT REPORTED DUE TO HIGH TRIGLYCERIDES 01/05/2018 0500   VLDL UNABLE TO CALCULATE IF TRIGLYCERIDE OVER 400 mg/dL 01/05/2018 0500   LDLCALC UNABLE TO CALCULATE IF TRIGLYCERIDE OVER 400 mg/dL 01/05/2018 0500   HgbA1c:  Lab Results  Component Value Date   HGBA1C 6.4 (H) 01/04/2018   Urine Drug Screen:     Component Value Date/Time   LABOPIA NONE DETECTED 01/04/2018 0833   COCAINSCRNUR NONE DETECTED 01/04/2018 0833   LABBENZ NONE DETECTED 01/04/2018 0833   AMPHETMU NONE DETECTED 01/04/2018 0833   THCU POSITIVE (A) 01/04/2018 0833   LABBARB NONE DETECTED 01/04/2018 0833    Alcohol Level No results found for: ETH  IMAGING Ct Head Wo  Contrast 01/03/2018 Patchy low-density in the bilateral cerebellum and occipital lobes. This pattern suggests posterior circulation acute infarcts-consider CTA. Given young age and recent chemotherapy underlying PRES is a consideration-although no symmetric cerebral edema. Metastatic disease with edema is possible but less likely given the distribution.   Ct Angio Head W Or Wo Contrast Ct Angio Neck W And/or Wo Contrast 01/03/2018 1. 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  2. No embolic source seen in the neck, although limited by significant motion artifact.   Mr Brain Wo Contrast 01/03/2018 1. Acute numerous supra- and infratentorial posterior circulation nonhemorrhagic infarcts.  2. 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   Transthoracic Echocardiogram - canceled given TEE results  Bilateral Lower Extremity Venous Dopplers There is no DVT or SVT noted in the bilateral lower extremities.   TEE   Left Ventrical:  65-70%, at least moderate LVH Mitral Valve:  normal, no vegetation Aortic Valve: normal, no vegetation Tricuspid Valve: mild TR, no vegetation Pulmonic Valve: trivial PR Left Atrium/ Left atrial appendage: no thrombus Atrial septum: No ASD or PFO Aorta: mild non-mobile atheroma                                                                                                                                                                                                         PHYSICAL EXAM  young African-American gentleman who is intubated and sedated. . Afebrile. Head is nontraumatic. Neck is supple without bruit.    Cardiac exam no murmur or gallop. Lungs are clear to auscultation. Distal pulses are well felt. Neurological Exam :  Drowsy but can be aroused.able to open eyes briefly. Does follow with gaze but with limitation of abduction in the right eye and adduction in the left eye. No nystagmus.and moves  right-sided side purposefully against gravity.dense left hemiplegia but can  move her left leg   only trace withdrawal  Sensation, coordination and gait cannot be tested reliably.   ASSESSMENT/PLAN [name] is a 37 y.o. male with history of renal insufficiency and testicular cancer with lymph node metastasis who presented to [facility] ED this morning with AMS, rigors and dystonic posturing. He did not receive IV t-PA due to unknown time of onset.  Stroke: bilateral cerebellar, brainstem and occipital infarcts due to BA occlusion s/p IR with TICI2b reperfusion and rescue stenting - embolic - source unclear, could be Paradoxical emboli  CT head - Patchy low-density in the bilateral cerebellum and occipital lobes.  MRI head - Acute numerous supra - and infratentorial posterior circulation nonhemorrhagic infarcts.   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   TEE - EF  65% to 70%. No PFO. No SOE  LE Dopplers negative  LDL - unable to calculate due to TG 1,966 on Clevipex and propofol. Repeat TG 1449  HgbA1c - 6.4  No need for EEG  UDS - THC positive  VTE prophylaxis - heparin subq, d/c SCDs  Diet - NPO - hold given planned extubation  No antithrombotic prior to admission, now on aspirin 81 mg daily and Brilinta 90 mg BID given stent   Therapy recommendations:  pending  Disposition:  Pending  Right testicular tumor  Followed by Dr. Ron Agee w/ oncology  With lymph node metastasis  S/p orchidectomy and currently on chemo with cisplatin and etoposide with 1 more dose due in Dec  Dr. Jana Hakim does not feel likely to be hypercoagulable state from testicular  tumor or from cistplatin / etoposide - he recommended continued embolic workup  Acute respiratory failure  Intubated on sedation for airway protection  CCM on board  plan extubation today  Hypertension  Stable . On cleviprex . Increase SBP goal 110-180 . on  multiple BP meds including amlodipine, chlorthalidone, clonidine  . Wean off cleviprex as able . Long-term BP goal normotensive  Hyperlipidemia  Lipid lowering medication PTA:  none  LDL unable to calculate due to high TG, goal < 70  TG 1966 likely contamination from propofol and cleviprex  Repeat lipid 1449   Current lipid lowering medication: none  Add statin once able to swallow  Tobacco abuse  Current cigar smoker  Smoking cessation counseling will be provided  Leukocytosis with low grade fever  Felt to be due to chemo  WBC 15.1->3.6 -> 3.8 -> 0.9   Tmax 98.9  UA pending  CXR no active disease  Received 1-2 doses of vanco, rocephin and acyclovir - all stopped  Leonie Man spoke with oncologist at [facility], will start neupogen   Other Stroke Risk Factors  THC user - UDS positive for THC  Other Active Problems  Hemoglobin 12.0->19.1->10.9->9.2  Hyponatremia Na - 132->136->134  Hypokalemia 2.8 - replaced->3.5  Hypocalcemia->8.1  Hospital day # [place], MSN, APRN, ANVP-BC, AGPCNP-BC Advanced Practice Stroke Nurse [place] for Schedule & Pager information 01/06/2018 10:31 AM  I have personally examined this patient, reviewed notes, independently viewed imaging studies, participated in medical decision making and plan of care.ROS completed by me personally and pertinent positives fully documented  I have made any additions or clarifications directly to the above note. Agree with note above.  I spoke to Dr. Gwenyth Allegra oncologist and discussed patient's condition. He recommended starting Neupogen and patient's chemotherapy will have to be on hold in definitively. I also discussed the patient's wife and informed her about the plan. Discussed with Dr.Sood. Try to extubate him today if tolerated.This patient is critically ill and at significant risk of neurological worsening, death and care requires constant monitoring of vital signs,  hemodynamics,respiratory and cardiac monitoring, extensive review of multiple databases, frequent neurological assessment, discussion with family, other specialists and medical decision making of high complexity.I have made any additions or clarifications directly to the above note.This critical care time does not reflect procedure time, or teaching time or supervisory time of PA/NP/Med Resident etc but could involve care discussion time.  I spent 35 minutes of neurocritical care time  in the care of  this patient.      Antony Contras, MD Medical Director [facility] St[facility] Pager: [phone] 01/06/2018 3:11 PM     To contact Stroke Continuity provider, please refer to [email or web address] After hours, contact General Neurology

## 2018-01-06 NOTE — Progress Notes (Signed)
SLP Cancellation Note  Patient Details Name: Caleb Hubbard MRN: [id number] DOB: [birth year]   Cancelled treatment:       Reason Eval/Treat Not Completed: Patient not medically ready.  Pt remains intubated.  Will continue to follow along as pt is medically appropriate.     Merritt Kibby, Selinda Orion 01/06/2018, 10:18 AM

## 2018-01-06 NOTE — Evaluation (Signed)
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 Presents on 11/23 with nausea, altered mental status, dystonic posturing.  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   Vented 11/23.   Clinical Impression  Pt admitted with above. Pt remains on vent but was able to follow 1 step commands but unable to maintain eye opening. Pt able to move R UE/LE however presents with L UE/LE flaccidity and no active mvmt. Pt was indep and driving PTA, now requires maxAX2 for all mobility. Recommend CIR upon d/c from maximal functional recovery. Acute PT to follow.    Follow Up Recommendations CIR    Equipment Recommendations  None recommended by PT(TBD)    Recommendations for Other Services Rehab consult     Precautions / Restrictions Precautions Precautions: Fall Restrictions Weight Bearing Restrictions: No      Mobility  Bed Mobility Overal bed mobility: Needs Assistance Bed Mobility: Supine to Sit;Sit to Supine     Supine to sit: +2 for physical assistance;HOB elevated;Max assist Sit to supine: +2 for physical assistance;HOB elevated;Max assist   General bed mobility comments: pt pulled up with with R UE on PT however due to vent required maxAx2  Transfers                 General transfer comment: deferred due to vent and lethargy  Ambulation/Gait                Stairs            Wheelchair Mobility    Modified Rankin (Stroke Patients Only) Modified Rankin (Stroke Patients Only) Pre-Morbid Rankin Score: No symptoms Modified Rankin: Severe disability     Balance Overall balance assessment: Needs  assistance Sitting-balance support: Bilateral upper extremity supported Sitting balance-Leahy Scale: Poor Sitting balance - Comments: pt required maxA to maintain EOB balance, pt unable ot use L UE to support self Postural control: Posterior lean                                   Pertinent Vitals/Pain Pain Assessment: Faces Faces Pain Scale: Hurts a little bit Pain Location: generalized discomfort  Pain Descriptors / Indicators: Discomfort;Grimacing Pain Intervention(s): Monitored during session    Home Living Family/patient expects to be discharged to:: Private residence Living Arrangements: Parent Available Help at Discharge: Family Type of Home: House Home Access: Stairs to enter Entrance Stairs-Rails: None Entrance Stairs-Number of Steps: 3 Home Layout: Two level;Able to live on main level with bedroom/bathroom Home Equipment: None Additional Comments: lives with significant other, but home setup is for his parents house in [place] as his mother would like to take him home. (information provided from mother )    Prior Function Level of Independence: Independent         Comments: driving, not working      Journalist, newspaper        Extremity/Trunk Assessment   Upper Extremity Assessment Upper Extremity Assessment: Defer to OT evaluation RUE Deficits / Details: WFL  LUE Deficits / Details: flaccid, non functional, no initation but able to give thumbs up when asked if  touched  LUE Sensation: WNL LUE Coordination: decreased fine motor;decreased gross motor    Lower Extremity Assessment Lower Extremity Assessment: LLE deficits/detail LLE Deficits / Details: flaccid, no initiation of movement, no withdrawl to pain however gave thumbs up with R accurately when asked if he was being touched    Cervical / Trunk Assessment Cervical / Trunk Assessment: Normal  Communication   Communication: Other (comment)  Cognition Arousal/Alertness:  Lethargic Behavior During Therapy: Flat affect Overall Cognitive Status: Impaired/Different from baseline Area of Impairment: Following commands;Safety/judgement;Awareness;Problem solving                       Following Commands: Follows one step commands with increased time;Follows one step commands inconsistently Safety/Judgement: Decreased awareness of deficits(R gaze preference)   Problem Solving: Difficulty sequencing General Comments: pt able to follow 1 step commands but unable to initiate L UE/LE      General Comments General comments (skin integrity, edema, etc.): pt with alot of clear secreations from mouth and nose requiring freq suction, HR 109-118    Exercises     Assessment/Plan    PT Assessment Patient needs continued PT services  PT Problem List Decreased strength;Decreased range of motion;Decreased activity tolerance;Decreased balance;Decreased mobility;Decreased coordination;Decreased cognition;Decreased knowledge of use of DME;Decreased safety awareness;Decreased knowledge of precautions       PT Treatment Interventions DME instruction;Gait training;Stair training;Functional mobility training;Therapeutic activities;Therapeutic exercise;Balance training;Neuromuscular re-education;Cognitive remediation    PT Goals (Current goals can be found in the Care Plan section)  Acute Rehab PT Goals Patient Stated Goal: none stated PT Goal Formulation: With patient/family Time For Goal Achievement: 01/20/18 Potential to Achieve Goals: Fair    Frequency Min 4X/week   Barriers to discharge        Co-evaluation PT/OT/SLP Co-Evaluation/Treatment: Yes Reason for Co-Treatment: Complexity of the patient's impairments (multi-system involvement) PT goals addressed during session: Mobility/safety with mobility         AM-PAC PT "6 Clicks" Mobility  Outcome Measure Help needed turning from your back to your side while in a flat bed without using bedrails?: A  Lot Help needed moving from lying on your back to sitting on the side of a flat bed without using bedrails?: A Lot Help needed moving to and from a bed to a chair (including a wheelchair)?: Total Help needed standing up from a chair using your arms (e.g., wheelchair or bedside chair)?: Total Help needed to walk in hospital room?: Total Help needed climbing 3-5 steps with a railing? : Total 6 Click Score: 8    End of Session   Activity Tolerance: Patient tolerated treatment well Patient left: in bed;with call bell/phone within reach;with nursing/sitter in room(in chair position) Nurse Communication: Mobility status PT Visit Diagnosis: Difficulty in walking, not elsewhere classified (R26.2);Hemiplegia and hemiparesis Hemiplegia - Right/Left: Left Hemiplegia - caused by: Nontraumatic intracerebral hemorrhage    Time: 1120-1159 PT Time Calculation (min) (ACUTE ONLY): 39 min   Charges:   PT Evaluation $PT Eval Moderate Complexity: 1 Mod          [name], PT, DPT Acute Rehabilitation Services Pager #: [phone] Office #: [phone]   [name] 01/06/2018, 1:42 PM

## 2018-01-07 ENCOUNTER — Encounter (HOSPITAL_COMMUNITY): Payer: Self-pay

## 2018-01-07 ENCOUNTER — Inpatient Hospital Stay (HOSPITAL_COMMUNITY): Payer: Medicaid Other

## 2018-01-07 DIAGNOSIS — C801 Malignant (primary) neoplasm, unspecified: Secondary | ICD-10-CM | POA: Diagnosis present

## 2018-01-07 DIAGNOSIS — I69319 Unspecified symptoms and signs involving cognitive functions following cerebral infarction: Secondary | ICD-10-CM

## 2018-01-07 DIAGNOSIS — D701 Agranulocytosis secondary to cancer chemotherapy: Secondary | ICD-10-CM

## 2018-01-07 DIAGNOSIS — I69391 Dysphagia following cerebral infarction: Secondary | ICD-10-CM

## 2018-01-07 LAB — BASIC METABOLIC PANEL
ANION GAP: 11 (ref 5–15)
ANION GAP: 11 (ref 5–15)
BUN: 15 mg/dL (ref 6–20)
BUN: 11 mg/dL (ref 6–20)
CALCIUM: 8.6 mg/dL — AB (ref 8.9–10.3)
CALCIUM: 8.9 mg/dL (ref 8.9–10.3)
CHLORIDE: 99 mmol/L (ref 98–111)
CO2: 25 mmol/L (ref 22–32)
CO2: 23 mmol/L (ref 22–32)
CREATININE: 0.99 mg/dL (ref 0.61–1.24)
Chloride: 97 mmol/L — ABNORMAL LOW (ref 98–111)
Creatinine, Ser: 0.88 mg/dL (ref 0.61–1.24)
GFR calc Af Amer: >60 mL/min (ref >60)
GLUCOSE: 100 mg/dL — AB (ref 70–99)
Glucose, Bld: 118 mg/dL — ABNORMAL HIGH (ref 70–99)
POTASSIUM: 2.4 mmol/L — AB (ref 3.5–5.1)
Potassium: 2.8 mmol/L — ABNORMAL LOW (ref 3.5–5.1)
SODIUM: 133 mmol/L — AB (ref 135–145)
Sodium: 133 mmol/L — ABNORMAL LOW (ref 135–145)

## 2018-01-07 LAB — CBC WITH DIFFERENTIAL/PLATELET
Abs Immature Granulocytes: 0 10*3/uL (ref 0.00–0.07)
Band Neutrophils: 2 %
Basophils Absolute: 0 10*3/uL (ref 0.0–0.1)
Basophils Relative: 0 %
EOS ABS: 0 10*3/uL (ref 0.0–0.5)
EOS PCT: 0 %
HEMATOCRIT: 23.5 % — AB (ref 39.0–52.0)
Hemoglobin: 8 g/dL — ABNORMAL LOW (ref 13.0–17.0)
LYMPHS ABS: 0.4 10*3/uL — AB (ref 0.7–4.0)
Lymphocytes Relative: 72 %
MCH: 30.8 pg (ref 26.0–34.0)
MCHC: 34 g/dL (ref 30.0–36.0)
MCV: 90.4 fL (ref 80.0–100.0)
Monocytes Absolute: 0 10*3/uL — ABNORMAL LOW (ref 0.1–1.0)
Monocytes Relative: 1 %
NEUTROS PCT: 25 %
Neutro Abs: 0.1 10*3/uL — ABNORMAL LOW (ref 1.7–7.7)
Platelets: UNDETERMINED 10*3/uL (ref 150–400)
RBC: 2.6 MIL/uL — ABNORMAL LOW (ref 4.22–5.81)
RDW: 14.4 % (ref 11.5–15.5)
WBC: 0.5 10*3/uL — CL (ref 4.0–10.5)
nRBC: 0 % (ref 0.0–0.2)

## 2018-01-07 LAB — GLUCOSE, CAPILLARY
GLUCOSE-CAPILLARY: 100 mg/dL — AB (ref 70–99)
GLUCOSE-CAPILLARY: 101 mg/dL — AB (ref 70–99)
GLUCOSE-CAPILLARY: 112 mg/dL — AB (ref 70–99)
Glucose-Capillary: 103 mg/dL — ABNORMAL HIGH (ref 70–99)
Glucose-Capillary: 104 mg/dL — ABNORMAL HIGH (ref 70–99)
Glucose-Capillary: 146 mg/dL — ABNORMAL HIGH (ref 70–99)
Glucose-Capillary: 94 mg/dL (ref 70–99)
Glucose-Capillary: 94 mg/dL (ref 70–99)

## 2018-01-07 LAB — SICKLE CELL SCREEN: Sickle Cell Screen: NEGATIVE

## 2018-01-07 LAB — MAGNESIUM: Magnesium: 1.6 mg/dL — ABNORMAL LOW (ref 1.7–2.4)

## 2018-01-07 LAB — TRIGLYCERIDES: TRIGLYCERIDES: 285 mg/dL — AB (ref <150)

## 2018-01-07 LAB — PHOSPHORUS: PHOSPHORUS: 2.3 mg/dL — AB (ref 2.5–4.6)

## 2018-01-07 MED ORDER — POTASSIUM PHOSPHATES 15 MMOLE/5ML IV SOLN
20.0000 mmol | Freq: Once | INTRAVENOUS | Status: AC
  Administered 2018-01-07: 20 mmol via INTRAVENOUS
  Filled 2018-01-07: qty 6.67

## 2018-01-07 MED ORDER — MAGNESIUM SULFATE 2 GM/50ML IV SOLN
2.0000 g | Freq: Once | INTRAVENOUS | Status: AC
  Administered 2018-01-07: 2 g via INTRAVENOUS
  Filled 2018-01-07: qty 50

## 2018-01-07 MED ORDER — OSMOLITE 1.2 CAL PO LIQD
1000.0000 mL | ORAL | Status: DC
  Administered 2018-01-07: 1000 mL
  Filled 2018-01-07 (×8): qty 1000

## 2018-01-07 MED ORDER — POTASSIUM CHLORIDE 20 MEQ/15ML (10%) PO SOLN
40.0000 meq | ORAL | Status: AC
  Administered 2018-01-07 (×2): 40 meq via ORAL
  Filled 2018-01-07 (×2): qty 30

## 2018-01-07 MED ORDER — TBO-FILGRASTIM 300 MCG/0.5ML ~~LOC~~ SOSY
300.0000 ug | PREFILLED_SYRINGE | Freq: Once | SUBCUTANEOUS | Status: AC
  Administered 2018-01-07: 300 ug via SUBCUTANEOUS
  Filled 2018-01-07: qty 0.5

## 2018-01-07 MED ORDER — TBO-FILGRASTIM 480 MCG/0.8ML ~~LOC~~ SOSY
480.0000 ug | PREFILLED_SYRINGE | Freq: Every day | SUBCUTANEOUS | Status: DC
  Administered 2018-01-07 – 2018-01-10 (×4): 480 ug via SUBCUTANEOUS
  Filled 2018-01-07 (×5): qty 0.8

## 2018-01-07 NOTE — Progress Notes (Signed)
Caleb Hubbard, MRN:  [id number], DOB:  October 11, 1980, LOS: 4 ADMISSION DATE:  01/03/2018, CONSULTATION DATE:  11/23 REFERRING MD:  Dr. Cheral Marker , CHIEF COMPLAINT:  CVA   Brief History   37 yo male with nausea, altered mental status, dystonic posturing.  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  Remained on vent after procedure.  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  Past Medical History  HTN, Depression  Significant Hospital Events   11/23 admit for brainstem stroke. IR revascularization. On vent.   Consults:  Neuro IR Neuro  Procedures:  11/23 IR cerebral arteriogram with revascularization.  ETT 11/23 > 11/27   Significant Diagnostic Tests:  CT head 11/23 > Patchy low-density in the bilateral cerebellum and occipital lobes. This pattern suggests posterior circulation acute infarcts-consider CTA. Given young age and recent chemotherapy underlying PRES is a consideration-although no symmetric cerebral edema. Metastatic disease with edema is possible but less likely given the distribution. 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 No embolic source seen in the neck, although limited by significant motion artifact. MRI Brain 11/23 >> numerous acute supra and infratentorial posterior circulation nonhemorrhagic infarcts, acute basilar artery thrombosis. TEE 11/25 > EF 65 to 70%, mod LVH, no ASD or PFO  Micro Data:  Blood 11/23 >  Antimicrobials:  N/A   Interim history/subjective:  Extubated 11/26. No issues overnight.   Objective   Blood pressure 131/77, pulse 87, temperature 99.3 F (37.4 C), temperature source Oral, resp. rate (!) 30, height 5\' 11"  (1.803 m), weight 67 kg, SpO2 98 %.    Vent Mode:  CPAP;PSV FiO2 (%):  [30 %] 30 % PEEP:  [5 cmH20] 5 cmH20 Pressure Support:  [5 cmH20] 5 cmH20   Intake/Output Summary (Last 24 hours) at 01/07/2018 1012 Last data filed at 01/07/2018 7412 Gross per 24 hour  Intake 1721.68 ml  Output 2675 ml  Net -953.32 ml   Filed Weights   01/03/18 1300 01/03/18 1845 01/07/18 0500  Weight: 66 kg 67.1 kg 67 kg    Examination: General: Adult male, no distress  EENT - Dry MM  Cardiac - RRR, no MRG  Chest -Clear breath sounds, no wheeze/crackles  Abdomen - soft, non-tender, active bowel sounds  Extremities - no edema  Skin - no rashes Neuro - no movement to LUE/LLE, 4/5 RUE, 4/5 RLE    Assessment & Plan:   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 Plan - Per Neurology  - Plans for Inpatient Rehab  - continue ASA, brilinta  Left Basilar Atelectasis  Plan  -Wean Supplemental Oxygen to maintain Saturation >92 -Pulmonary Hygiene > IS, Up to Chair   Hypertension. Plan -Cardiac Monitoring  -Goal SBP 110 to 180 -Continue norvasc, chlorthalidone, catapres  Chemotherapy induced leukopenia. Plan - Oncology/Hematology Following  - Trend CBC  - Neutropenic precautions - Granix given 11/26, Re-dose for 11/27   Hypokalemia. Hypomagnesemia  Plan - Trend BMP  - Replace electrolytes as indicated   Hypertriglyceridemia from diprivan and cleviprex (off both). Plan - Trend triglyceride levels - down-trended to 285   Dysphagia s/p CVA Plan  -Place Cortrack  -Continue TF -Speech Therapy    Best practice:  Diet: Tube feeds DVT prophylaxis: SCDs/Heparin GI prophylaxis: Protonix Mobility: BR Code Status: Full  Family Communication: Mother updated at bedside   CC time 57 minutes  Hayden Pedro, AGACNP-BC [place] Pgr: [phone]

## 2018-01-07 NOTE — Procedures (Signed)
Cortrak  Tube Type:  Cortrak - 43 inches Tube Location:  Left nare Initial Placement:  Postpyloric Secured by: Bridle Technique Used to Measure Tube Placement:  Documented cm marking at nare/ corner of mouth Cortrak Secured At:  79 cm    Cortrak Tube Team Note:  Consult received to place a Cortrak feeding tube.   No x-Troyer is required. RN may begin using tube.   If the tube becomes dislodged please keep the tube and contact the Cortrak team at [email or web address] ([email or web address]) for replacement.  If after hours and replacement cannot be delayed, place a NG tube and confirm placement with an abdominal x-Holzer.    Koleen Distance MS, RD, LDN Pager #- [phone] Office#- [phone] After Hours Pager: [phone]

## 2018-01-07 NOTE — Evaluation (Signed)
Clinical/Bedside Swallow Evaluation NOTE DICTATED FOR Caleb Hubbard, SLP Patient Details  Name: Caleb Hubbard MRN: [id number] Date of Birth: Jul 12, 1980  Today's Date: 01/07/2018 Time: SLP Start Time (ACUTE ONLY): 24 SLP Stop Time (ACUTE ONLY): 0940 SLP Time Calculation (min) (ACUTE ONLY): 20 min  Past Medical History:  Past Medical History:  Diagnosis Date  . Anxiety   . Cancer ([place])   . Headache   . Hypertension   . Testicular mass    Past Surgical History:  Past Surgical History:  Procedure Laterality Date  . IR ANGIO INTRA EXTRACRAN SEL COM CAROTID INNOMINATE BILAT MOD SED  01/03/2018  . IR ANGIO VERTEBRAL SEL VERTEBRAL UNI L MOD SED  01/03/2018  . IR CT HEAD LTD  01/03/2018  . IR INTRA CRAN STENT  01/03/2018  . IR PERCUTANEOUS ART THROMBECTOMY/INFUSION INTRACRANIAL INC DIAG ANGIO  01/03/2018  . NO PAST SURGERIES    . ORCHIECTOMY Right 06/17/2017   Procedure: RIGHT RADICAL ORCHIECTOMY;  Surgeon: Ceasar Mons, MD;  Location: [facility];  Service: Urology;  Laterality: Right;  . RADIOLOGY WITH ANESTHESIA N/A 01/03/2018   Procedure: CODE STROKE;  Surgeon: Luanne Bras, MD;  Location: [facility];  Service: Radiology;  Laterality: N/A;   HPI:  [name] is a 37 y.o. male with history of renal insufficiency and testicular cancer with lymph node metastasis who presented with AMS, rigors and dystonic posturing. He did not receive IV t-PA due to unknown time of onset. 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 Pt intubated 11/23 to 11/26.    Assessment / Plan / Recommendation Clinical Impression  Pt demonstrates signs concerning for neuromuscular oropharyngeal dysphagia as well as concern for decreased airway protection following prolonged intubation. Objective test warranted to assess ability to start oral diet. Discussed  with physician - pt is likely to need temporary alternative nutrition.  SLP Visit Diagnosis: Dysphagia, unspecified (R13.10)    Aspiration Risk  Moderate aspiration risk    Diet Recommendation NPO;Alternative means - temporary   Medication Administration: Via alternative means    Other  Recommendations Oral Care Recommendations: Oral care QID   Follow up Recommendations (tba)      Frequency and Duration            Prognosis        Swallow Study   General HPI: [name] is a 37 y.o. male with history of renal insufficiency and testicular cancer with lymph node metastasis who presented with AMS, rigors and dystonic posturing. He did not receive IV t-PA due to unknown time of onset. 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 Pt intubated 11/23 to 11/26.  Type of Study: Bedside Swallow Evaluation Previous Swallow Assessment: none in chart Diet Prior to this Study: NPO Temperature Spikes Noted: Yes(100.5) Respiratory Status: Room air History of Recent Intubation: Yes Length of Intubations (days): 4 days Date extubated: 01/06/18 Baseline Vocal Quality: Not observed    Oral/Motor/Sensory Function Overall Oral Motor/Sensory Function: Generalized oral weakness(mandibular jerk)   Ice Chips Ice chips: Impaired Oral Phase Functional Implications: Other (comment);Prolonged oral transit(anterior spill) Pharyngeal Phase Impairments: Suspected delayed Swallow   Thin Liquid Thin Liquid: Impaired Presentation: Cup Oral Phase Functional Implications: Prolonged oral transit;Other (comment)(anterior spill) Pharyngeal  Phase Impairments:  Cough - Delayed    Nectar Thick Nectar Thick Liquid: Not tested   Honey Thick Honey Thick Liquid: Not tested   Puree Puree: Impaired Presentation: Spoon Oral Phase Impairments: Reduced labial seal;Other  (comment)(? lingual pumping) Oral Phase Functional Implications: Prolonged oral transit   Solid     Solid: Not tested      Germain Osgood 01/07/2018,11:35 AM  Germain Osgood, M.A. [facility] Acute Environmental education officer [phone] Office [phone]

## 2018-01-07 NOTE — Consult Note (Signed)
Physical Medicine and Rehabilitation Consult   Reason for Consult:  Functional deficits due to stroke.  Referring Physician: Dr. Leonie Man   HPI: Caleb Hubbard is a 37 y.o. male with history of HTN, situational depression, testicular cancer status post radical orchiectomy, recent retroperitoneal lymph node dissection with initiation of chemo11/18; who was admitted on 01/03/2018 with confusion, dystonic posturing and concerns of seizure seizure activity.  UDS positive for THC. CT head done showing patchy low density in bilateral cerebellum and occipital lobes suggestive of acute infarcts versus PRES. 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  He underwent cerebral angiogram with endovascular revascularization of occluded basilar artery followed by rescue stent of severe focal stenosis in mid basilar artery by Dr. Estanislado Pandy.  Follow-up MRI of brain showed numerous supra and infratentorial posterior circulation nonhemorrhagic infarcts with mild cytotoxic edema.  Blood cultures x2 done due to leukocytosis and negative so far.  Bilateral lower extreme Dopplers negative for DVT.  TEE showed no cardiac source of emboli, no ASD or PFO, no wall abnormality and moderate LVH with EF of 65 to 70%..  Placed on neutropenic precautions due to drop in WBC to 0.5 and pancytopenic today. Therapy evaluation done while intubated and limited. Patient lethargic with difficulty following one-step commands, delayed processing and noted to have flaccid left upper extremity.  He tolerated extubation yesterday and failed bedside swallow evaluation today. Tube feeds recommended for nutritional support. Dr. Leonie Man felt stroke embolic with and unclear source-that could be paradoxical emboli to continue aspirin and Brilinta given stent. CIR recommended due  to functional deficits.   opens eyes to command  Review of Systems  Unable to perform ROS: Patient nonverbal    Eyes: Positive for blurred vision.  Respiratory: Negative for cough and hemoptysis.   Cardiovascular: Negative for chest pain and palpitations.  Neurological: Positive for sensory change (pointing to left side as source of pain), speech change and focal weakness.      Past Medical History:  Diagnosis Date  . Anxiety   . Cancer ([place])   . Headache   . Hypertension   . Testicular mass     Past Surgical History:  Procedure Laterality Date  . IR ANGIO INTRA EXTRACRAN SEL COM CAROTID INNOMINATE BILAT MOD SED  01/03/2018  . IR ANGIO VERTEBRAL SEL VERTEBRAL UNI L MOD SED  01/03/2018  . IR CT HEAD LTD  01/03/2018  . IR INTRA CRAN STENT  01/03/2018  . IR PERCUTANEOUS ART THROMBECTOMY/INFUSION INTRACRANIAL INC DIAG ANGIO  01/03/2018  . NO PAST SURGERIES    . ORCHIECTOMY Right 06/17/2017   Procedure: RIGHT RADICAL ORCHIECTOMY;  Surgeon: Ceasar Mons, MD;  [facility];  Service: Urology;  Laterality: Right;  . RADIOLOGY WITH ANESTHESIA N/A 01/03/2018   Procedure: CODE STROKE;  Surgeon: Luanne Bras, MD;  Location: [facility];  Service: Radiology;  Laterality: N/A;    Family History  Problem Relation Age of Onset  . High blood pressure Father      Social History:   Single. Has been unemployed since diagnosis of cancer? Lives with girlfriend (disabled due to psychological issues?) and her 32 year old child. Parents live in [place] and supportive.  Per reports that he has been smoking cigarettes and cigars. He has never used smokeless tobacco. Per  Reports he has current or past drug history. Drug: Marijuana. Per reports  he does not drink alcohol.    Allergies: No Known Allergies  Medications Prior to Admission  Medication Sig Dispense Refill  . amLODipine (NORVASC) 10 MG tablet Take 10 mg by mouth daily.    . chlorthalidone (HYGROTON) 25 MG tablet Take 25 mg by mouth daily.  2  . cloNIDine (CATAPRES) 0.2 MG tablet Take 0.2 mg by mouth 2 (two) times daily.     Marland Kitchen dexamethasone (DECADRON) 4 MG tablet Take 4 mg by mouth See admin instructions. Take 2 tablets by mouth once daily for 3 days start the day after chemotherapy and continue for 3 days  0  . OLANZapine (ZYPREXA) 10 MG tablet Take 10 mg by mouth See admin instructions. Take 1 tablet by mouth nightly for 3 doses start the day after chemotherapy and continue for 3 days  2  . ondansetron (ZOFRAN) 8 MG tablet Take 8 mg by mouth every 8 (eight) hours as needed for nausea/vomiting.  2  . prochlorperazine (COMPAZINE) 10 MG tablet Take 10 mg by mouth every 6 (six) hours as needed for nausea/vomiting.  2    Home: Home Living Family/patient expects to be discharged to:: Private residence Living Arrangements: Parent Available Help at Discharge: Family Type of Home: House Home Access: Stairs to enter Technical brewer of Steps: 3 Entrance Stairs-Rails: None Home Layout: Two level, Able to live on main level with bedroom/bathroom Bathroom Shower/Tub: Multimedia programmer: [place]: None Additional Comments: lives with significant other, but home setup is for his parents house in [place] as his mother would like to take him home. (information provided from mother )  Functional History: Prior Function Level of Independence: Independent Comments: driving, not working  Functional Status:  Mobility: Bed Mobility Overal bed mobility: Needs Assistance Bed Mobility: Supine to Sit, Sit to Supine Supine to sit: +2 for physical assistance, HOB elevated, Max assist Sit to supine: +2 for physical assistance, HOB elevated, Max assist General bed mobility comments: pt pulled up with with R UE on PT however due to vent required maxAx2 Transfers General transfer comment: deferred due to vent and lethargy      ADL: ADL Overall ADL's : Needs assistance/impaired Eating/Feeding: NPO Grooming: Total assistance, Bed level Upper Body Bathing: Total assistance, Sitting Lower  Body Bathing: Total assistance, +2 for physical assistance, Bed level Upper Body Dressing : Total assistance, Sitting Lower Body Dressing: Total assistance, +2 for physical assistance, Bed level Toilet Transfer Details (indicate cue type and reason): deferred due to safety Toileting- Clothing Manipulation and Hygiene: Total assistance, Bed level, +2 for physical assistance Functional mobility during ADLs: Total assistance, +2 for physical assistance General ADL Comments: pt requires total assist +2 for bed mobility and sitting EOB, patient able to assist with use of R UE at times but fatigues easily   Cognition: Cognition Overall Cognitive Status: Impaired/Different from baseline Orientation Level: (P) Oriented X4(with choices, nods appropriately) Cognition Arousal/Alertness: Lethargic Behavior During Therapy: Flat affect Overall Cognitive Status: Impaired/Different from baseline Area of Impairment: Following commands, Safety/judgement, Awareness, Problem solving Following Commands: Follows one step commands with increased time, Follows one step commands inconsistently Safety/Judgement: Decreased awareness of deficits(R gaze preference) Problem Solving: Difficulty sequencing General Comments: pt able to follow 1 step commands but unable to initiate L UE/LE Difficult to assess due to: Intubated, Level of arousal  Blood pressure 131/77, pulse 87, temperature 99.3 F (37.4 C), temperature source Oral, resp. rate (!) 30, height 5\' 11"  (1.803 m), weight 67 kg, SpO2 98 %. Physical Exam  Nursing note and vitals reviewed. Constitutional: He appears well-developed and  well-nourished.  Lying in bed with eyes closed. Some lability noted.   Cardiovascular: Tachycardia present.  Respiratory: No stridor.  Neurological: He is alert.  Kept eyes closed but interacted and was able to follow basic motor commands without difficulty. Non-verbal--opened mouth minimally. Tongue deviation to the left.   Question dysconjugate gaze on the right with right inattention. Left sided weakness with sensory deficits (indicating pain along left hemibody). Left sided weakness with flexor tone LUE.   Patient does not cooperate with formal manual muscle testing. He withdraws to pinch in bilateral lower limbs and right upper limb, could not test left upper limb due to patient's bed position  Results for orders placed or performed during the hospital encounter of 01/03/18 (from the past 24 hour(s))  Glucose, capillary     Status: Abnormal   Collection Time: 01/06/18  8:08 PM  Result Value Ref Range   Glucose-Capillary 104 (H) 70 - 99 mg/dL  Glucose, capillary     Status: Abnormal   Collection Time: 01/06/18 11:41 PM  Result Value Ref Range   Glucose-Capillary 103 (H) 70 - 99 mg/dL  Glucose, capillary     Status: None   Collection Time: 01/07/18  3:39 AM  Result Value Ref Range   Glucose-Capillary 94 70 - 99 mg/dL  CBC with Differential     Status: Abnormal   Collection Time: 01/07/18  6:18 AM  Result Value Ref Range   WBC 0.5 (LL) 4.0 - 10.5 K/uL   RBC 2.60 (L) 4.22 - 5.81 MIL/uL   Hemoglobin 8.0 (L) 13.0 - 17.0 g/dL   HCT 23.5 (L) 39.0 - 52.0 %   MCV 90.4 80.0 - 100.0 fL   MCH 30.8 26.0 - 34.0 pg   MCHC 34.0 30.0 - 36.0 g/dL   RDW 14.4 11.5 - 15.5 %   Platelets PLATELET CLUMPS NOTED ON SMEAR, UNABLE TO ESTIMATE 150 - 400 K/uL   nRBC 0.0 0.0 - 0.2 %   Neutrophils Relative % 25 %   Neutro Abs 0.1 (L) 1.7 - 7.7 K/uL   Band Neutrophils 2 %   Lymphocytes Relative 72 %   Lymphs Abs 0.4 (L) 0.7 - 4.0 K/uL   Monocytes Relative 1 %   Monocytes Absolute 0.0 (L) 0.1 - 1.0 K/uL   Eosinophils Relative 0 %   Eosinophils Absolute 0.0 0.0 - 0.5 K/uL   Basophils Relative 0 %   Basophils Absolute 0.0 0.0 - 0.1 K/uL   Smear Review      PLATELET CLUMPING, SUGGEST RECOLLECTION OF SAMPLE IN CITRATE TUBE.   Abs Immature Granulocytes 0.00 0.00 - 0.07 K/uL   Reactive, Benign Lymphocytes PRESENT     Polychromasia PRESENT    Target Cells PRESENT   Basic metabolic panel     Status: Abnormal   Collection Time: 01/07/18  6:18 AM  Result Value Ref Range   Sodium 133 (L) 135 - 145 mmol/L   Potassium 2.4 (LL) 3.5 - 5.1 mmol/L   Chloride 97 (L) 98 - 111 mmol/L   CO2 25 22 - 32 mmol/L   Glucose, Bld 100 (H) 70 - 99 mg/dL   BUN 15 6 - 20 mg/dL   Creatinine, Ser 0.99 0.61 - 1.24 mg/dL   Calcium 8.6 (L) 8.9 - 10.3 mg/dL   GFR calc non Af Amer >60 >60 mL/min   GFR calc Af Amer >60 >60 mL/min   Anion gap 11 5 - 15  Phosphorus     Status: Abnormal   Collection Time:  01/07/18  6:18 AM  Result Value Ref Range   Phosphorus 2.3 (L) 2.5 - 4.6 mg/dL  Magnesium     Status: Abnormal   Collection Time: 01/07/18  6:18 AM  Result Value Ref Range   Magnesium 1.6 (L) 1.7 - 2.4 mg/dL  Triglycerides     Status: Abnormal   Collection Time: 01/07/18  6:18 AM  Result Value Ref Range   Triglycerides 285 (H) <150 mg/dL  Glucose, capillary     Status: None   Collection Time: 01/07/18  7:57 AM  Result Value Ref Range   Glucose-Capillary 94 70 - 99 mg/dL   Comment 1 Notify RN    Comment 2 Document in Chart    Dg Chest Port 1 View  Result Date: 01/07/2018 CLINICAL DATA:  Respiratory failure EXAM: PORTABLE CHEST 1 VIEW COMPARISON:  01/03/2018 FINDINGS: Cardiac shadow is stable. Endotracheal tube and nasogastric catheter have been removed in the interval. The lungs are hypoinflated with very minimal atelectatic changes in the left base. No sizable effusion or infiltrate is noted. No bony abnormality is seen. A vial is noted over the left upper quadrant consistent with extrinsic artifact. IMPRESSION: Minimal left basilar atelectasis related to poor inspiratory effort. Electronically Signed   By: Inez Catalina M.D.   On: 01/07/2018 08:05    Assessment/Plan: Diagnosis: Basilar artery thrombosis with weakness, impaired cognition, dysphagia 1. Does the need for close, 24 hr/day medical supervision in concert with  the patient's rehab needs make it unreasonable for this patient to be served in a less intensive setting? Yes 2. Co-Morbidities requiring supervision/potential complications: Testicular cot carcinoma status post orchiectomy, post chemo neutropenia 3. Due to bladder management, bowel management, safety, skin/wound care, disease management, medication administration, pain management and patient education, does the patient require 24 hr/day rehab nursing? Yes 4. Does the patient require coordinated care of a physician, rehab nurse, PT (1-2 hrs/day, 5 days/week), OT (1-2 hrs/day, 5 days/week) and SLP (.5-1 hrs/day, 5 days/week) to address physical and functional deficits in the context of the above medical diagnosis(es)? Yes Addressing deficits in the following areas: balance, endurance, locomotion, strength, transferring, bowel/bladder control, bathing, dressing, feeding, grooming, toileting, cognition, speech, language, swallowing and psychosocial support 5. Can the patient actively participate in an intensive therapy program of at least 3 hrs of therapy per day at least 5 days per week? No 6. The potential for patient to make measurable gains while on inpatient rehab is Currently poor 7. Anticipated functional outcomes upon discharge from inpatient rehab are n/a  with PT, n/a with OT, n/a with SLP. 8. Estimated rehab length of stay to reach the above functional goals is: Once appropriate, will need approximately 4 weeks 9. Anticipated D/C setting: Home versus SNF 10. Anticipated post D/C treatments: [facility] therapy 11. Overall Rehab/Functional Prognosis: fair  RECOMMENDATIONS: This patient's condition is appropriate for continued rehabilitative care in the following setting: Not ready for CIR at the current time.  Would anticipate further improvement and increasing therapy participation Patient has agreed to participate in recommended program. N/A Note that insurance prior authorization may be required for  reimbursement for recommended care.  Comment: Will monitor progress while in acute care setting  "I have personally performed a face to face diagnostic evaluation of this patient.  Additionally, I have reviewed and concur with the physician assistant's documentation above." [name] M.D. [facility] FAAPM&R (Sports Med, Neuromuscular Med) Diplomate Am Board of [employer], PA-C 01/07/2018

## 2018-01-07 NOTE — Progress Notes (Signed)
STROKE TEAM PROGRESS NOTE   SUBJECTIVE (INTERVAL HISTORY) His wife is at the bedside. WBC down further. On neutropenic precautions. Oncology Dr Jana Hakim recommends additional dose of  Neupogen.now extubated yesterday and is breathing well. Speech therapy recommends feeding tube  OBJECTIVE Vitals:   01/07/18 1100 01/07/18 1200 01/07/18 1300 01/07/18 1400  BP: (!) 153/89 114/78 121/76 120/78  Pulse: 98 82    Resp: (!) 27 (!) 23 (!) 24 (!) 26  Temp:  99.4 F (37.4 C)    TempSrc:  Oral    SpO2: 100% 97%    Weight:      Height:        CBC:  Recent Labs  Lab 01/06/18 0535 01/07/18 0618  WBC 0.9* 0.5*  NEUTROABS 0.5* 0.1*  HGB 9.2* 8.0*  HCT 26.8* 23.5*  MCV 89.6 90.4  PLT 150 PLATELET CLUMPS NOTED ON SMEAR, UNABLE TO ESTIMATE    Basic Metabolic Panel:  Recent Labs  Lab 01/06/18 0535 01/06/18 0851 01/07/18 0618  NA 134*  --  133*  K 3.5  --  2.4*  CL 103  --  97*  CO2 21*  --  25  GLUCOSE 114*  --  100*  BUN 16  --  15  CREATININE 1.15  --  0.99  CALCIUM 8.1*  --  8.6*  MG  --  1.6* 1.6*  PHOS 3.6  --  2.3*    Lipid Panel:     Component Value Date/Time   CHOL 204 (H) 01/05/2018 0500   TRIG 285 (H) 01/07/2018 0618   HDL NOT REPORTED DUE TO HIGH TRIGLYCERIDES 01/05/2018 0500   CHOLHDL NOT REPORTED DUE TO HIGH TRIGLYCERIDES 01/05/2018 0500   VLDL UNABLE TO CALCULATE IF TRIGLYCERIDE OVER 400 mg/dL 01/05/2018 0500   LDLCALC UNABLE TO CALCULATE IF TRIGLYCERIDE OVER 400 mg/dL 01/05/2018 0500   HgbA1c:  Lab Results  Component Value Date   HGBA1C 6.4 (H) 01/04/2018   Urine Drug Screen:     Component Value Date/Time   LABOPIA NONE DETECTED 01/04/2018 0833   COCAINSCRNUR NONE DETECTED 01/04/2018 0833   LABBENZ NONE DETECTED 01/04/2018 0833   AMPHETMU NONE DETECTED 01/04/2018 0833   THCU POSITIVE (A) 01/04/2018 0833   LABBARB NONE DETECTED 01/04/2018 0833    Alcohol Level No results found for: ETH  IMAGING Ct Head Wo Contrast 01/03/2018 Patchy low-density  in the bilateral cerebellum and occipital lobes. This pattern suggests posterior circulation acute infarcts-consider CTA. Given young age and recent chemotherapy underlying PRES is a consideration-although no symmetric cerebral edema. Metastatic disease with edema is possible but less likely given the distribution.   Ct Angio Head W Or Wo Contrast Ct Angio Neck W And/or Wo Contrast 01/03/2018 1. 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  2. No embolic source seen in the neck, although limited by significant motion artifact.   Mr Brain Wo Contrast 01/03/2018 1. Acute numerous supra- and infratentorial posterior circulation nonhemorrhagic infarcts.  2. 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   Transthoracic Echocardiogram - canceled given TEE results  Bilateral Lower Extremity Venous Dopplers There is no DVT or SVT noted in the bilateral lower extremities.   TEE   Left Ventrical:  65-70%, at least moderate LVH Mitral Valve: normal, no vegetation Aortic Valve: normal, no vegetation Tricuspid Valve: mild TR, no vegetation Pulmonic Valve: trivial PR Left Atrium/ Left atrial appendage: no thrombus Atrial septum: No ASD or PFO Aorta: mild non-mobile atheroma  PHYSICAL EXAM  young African-American gentleman  . Marland Kitchen Afebrile. Head is nontraumatic. Neck is supple without bruit.    Cardiac exam no murmur or gallop. Lungs are clear to auscultation. Distal pulses are well felt. Neurological Exam :  Awake alert patient appears depressed and is not very cooperative. Keep his eyes closed. Extraocular moments are full range with saccadic dysmetria bilaterally. Blinks to threat bilaterally. Left lower facial weakness. Tongue midline. Weak cough and  gag. Left hemiplegia but is able to move fingers and mild left grip. Trace withdrawal in the left lower extremity. Normal strength on the right side. Sensation appears diminished on the left side. Reflexes are depressed on the left normal on the right. Left plantar upgoing right downgoing. Gait not tested.   ASSESSMENT/PLAN [facility] is a 37 y.o. male with history of renal insufficiency and testicular cancer with lymph node metastasis who presented to [facility] ED this morning with AMS, rigors and dystonic posturing. He did not receive IV t-PA due to unknown time of onset.  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  MRI head - Acute numerous supra - and infratentorial posterior circulation nonhemorrhagic infarcts.   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   TEE - EF  65% to 70%. No PFO. No SOE  LE Dopplers negative  LDL - unable to calculate due to TG 1,966 on Clevipex and propofol. Repeat TG 1449  HgbA1c - 6.4  No need for EEG  UDS - THC positive  VTE prophylaxis - heparin subq, d/c SCDs  Diet - NPO - hold given planned extubation  No antithrombotic prior to admission, now on aspirin 81 mg daily and Brilinta 90 mg BID given stent   Therapy recommendations:  pending  Disposition:  Pending  Right testicular tumor  Followed by Dr. Ron Agee w/ oncology  With lymph node metastasis  S/p orchidectomy and currently on chemo with cisplatin and etoposide with 1 more dose due in Dec  Dr. Jana Hakim does not feel likely to be hypercoagulable state from testicular tumor or from cistplatin / etoposide - he recommended continued embolic workup  Acute  respiratory failure  Intubated on sedation for airway protection  CCM on board  plan extubation today  Hypertension  Stable . On cleviprex . Increase SBP goal 110-180 . on multiple BP meds including amlodipine, chlorthalidone, clonidine  . Wean off cleviprex as able . Long-term BP goal normotensive  Hyperlipidemia  Lipid lowering medication PTA:  none  LDL unable to calculate due to high TG, goal < 70  TG 1966 likely contamination from propofol and cleviprex  Repeat lipid 1449   Current lipid lowering medication: none  Add statin once able to swallow  Tobacco abuse  Current cigar smoker  Smoking cessation counseling will be provided  Leukocytosis with low grade fever  Felt to be due to chemo  WBC 15.1->3.6 -> 3.8 -> 0.9   Tmax 98.9  UA pending  CXR no active disease  Received 1-2 doses of vanco, rocephin and acyclovir - all stopped  Leonie Man spoke with oncologist at [facility], will start neupogen   Other Stroke Risk Factors  THC user - UDS positive for THC  Other Active Problems  Hemoglobin 12.0->19.1->10.9->9.2  Hyponatremia Na - 132->136->134  Hypokalemia 2.8 - replaced->3.5  Hypocalcemia->8.1  Hospital day # 4   I discussed with the patient's wife and informed her about the plan. Discussed with Dr.Sood.recommend panda tube for nutrition and medications. Mobilize out of bed. Therapy and rehabilitation consults. Continue Neupogen and neutropenic precautions.This   patient is critically ill and at significant risk of neurological worsening, death and care requires constant monitoring of vital signs, hemodynamics,respiratory and cardiac monitoring, extensive review of multiple databases, frequent neurological assessment, discussion with family, other specialists and medical decision making of high complexity.I have made any additions or clarifications directly to the above note.This critical care time does not reflect procedure time, or teaching time or  supervisory time of PA/NP/Med Resident etc but could involve care discussion time.  I spent 35 minutes of neurocritical care time  in the care of  this patient.      Antony Contras, MD Medical Director [facility] Pager: [phone] 01/07/2018 2:43 PM     To contact Stroke Continuity provider, please refer to [email or web address] After hours, contact General Neurology

## 2018-01-07 NOTE — Progress Notes (Signed)
[facility]   DOB:1980-07-23   VQ#:[id number]   IEP#:[id number]  Subjective:  Patient remains intubated, is lying on his left side in the bed, does not respond to voice or touch; no family in room   Objective: [name] examined in bed Vitals:   01/07/18 0930 01/07/18 1000  BP:  131/77  Pulse: (!) 102 87  Resp: (!) 29 (!) 30  Temp:    SpO2: 100% 98%    Body mass index is 20.6 kg/m.  Intake/Output Summary (Last 24 hours) at 01/07/2018 1111 Last data filed at 01/07/2018 1008 Gross per 24 hour  Intake 1699.09 ml  Output 2475 ml  Net -775.91 ml     CBG (last 3)  Recent Labs    01/06/18 2341 01/07/18 0339 01/07/18 0757  GLUCAP 103* 94 94     Labs:  [facility] Results  Component Value Date   WBC 0.5 (LL) 01/07/2018   HGB 8.0 (L) 01/07/2018   HCT 23.5 (L) 01/07/2018   MCV 90.4 01/07/2018   PLT PLATELET CLUMPS NOTED ON SMEAR, UNABLE TO ESTIMATE 01/07/2018   NEUTROABS 0.1 (L) 01/07/2018    @LASTCHEMISTRY @  Urine Studies No results for input(s): UHGB, CRYS in the last 72 hours.  Invalid input(s): UACOL, UAPR, USPG, UPH, UTP, UGL, [place], UBIL, UNIT, UROB, [place], UEPI, UWBC, [place], [place], [place], [place], [place]  Basic Metabolic Panel: Recent Labs  [place] 01/03/18 1120 01/04/18 0346 01/05/18 0500 01/06/18 0535 01/06/18 0851 01/07/18 0618  NA 140 132* 136 134*  --  133*  K 3.4* 3.8 2.8* 3.5  --  2.4*  CL 100 101 108 103  --  97*  CO2 26 22 15* 21*  --  25  GLUCOSE 103* 113* 102* 114*  --  100*  BUN 21* 17 9 16   --  15  CREATININE 1.01 0.50* 0.85 1.15  --  0.99  CALCIUM 9.7 8.2* 5.7* 8.1*  --  8.6*  MG 1.8  --   --   --  1.6* 1.6*  PHOS 2.9  --  4.1 3.6  --  2.3*   GFR Estimated Creatinine Clearance: 97.8 mL/min (by C-G formula based on SCr of 0.99 mg/dL). Liver Function Tests: Recent Labs  [place] 01/03/18 1120 01/05/18 0500 01/06/18 0535  AST 20  --   --   ALT 12  --   --   ALKPHOS 63  --   --   BILITOT 1.0  --   --   PROT 7.1  --   --   ALBUMIN 4.0  2.5* 2.5*   Recent Labs  [place] 01/03/18 1120  LIPASE 22   No results for input(s): AMMONIA in the last 168 hours. Coagulation profile Recent Labs  [facility] 01/03/18 1120  INR 1.16    CBC: Recent Labs  [place] 01/03/18 1120 01/04/18 0346 01/05/18 1105 01/06/18 0535 01/07/18 0618  WBC 15.1* 3.6* 3.8* 0.9* 0.5*  NEUTROABS 13.1* 3.3 3.2 0.5* 0.1*  HGB 12.0* 19.1* 10.9* 9.2* 8.0*  HCT 35.7* 54.2* 30.2* 26.8* 23.5*  MCV 96.5 93.9 90.4 89.6 90.4  PLT 298 137* 204 150 PLATELET CLUMPS NOTED ON SMEAR, UNABLE TO ESTIMATE   Cardiac Enzymes: Recent Labs  [place] 01/03/18 1120 01/03/18 1206  CKTOTAL 119  --   TROPONINI  --  <0.03   BNP: Invalid input(s): POCBNP CBG: Recent Labs  [place] 01/03/18 1124 01/06/18 2008 01/06/18 2341 01/07/18 0339 01/07/18 0757  GLUCAP 111* 104* 103* 94 94   D-Dimer No results for input(s):  DDIMER in the last 72 hours. Hgb A1c No results for input(s): HGBA1C in the last 72 hours. Lipid Profile Recent Labs    01/05/18 0500 01/07/18 0618  CHOL 204*  --   HDL NOT REPORTED DUE TO HIGH TRIGLYCERIDES  --   LDLCALC UNABLE TO CALCULATE IF TRIGLYCERIDE OVER 400 mg/dL  --   TRIG 1,449* 285*  CHOLHDL NOT REPORTED DUE TO HIGH TRIGLYCERIDES  --    Thyroid function studies No results for input(s): TSH, T4TOTAL, T3FREE, THYROIDAB in the last 72 hours.  Invalid input(s): FREET3 Anemia work up No results for input(s): VITAMINB12, FOLATE, FERRITIN, TIBC, IRON, RETICCTPCT in the last 72 hours. Microbiology Recent Results (from the past 240 hour(s))  Blood Culture (routine x 2)     Status: None (Preliminary result)   Collection Time: 01/03/18 11:20 AM  Result Value Ref Range Status   Specimen Description   Final    LEFT ANTECUBITAL Blood Culture adequate volume BOTTLES DRAWN AEROBIC ONLY Performed at [facility] [address], [place], [place] [address]    Special Requests   Final    NONE Performed at [facility], [facility] [address], [place], [place] [address]    Culture   Final    NO GROWTH 4 DAYS Performed at [facility], [facility] [address] [place], [place] [address]    Report Status PENDING  Incomplete  Blood Culture (routine x 2)     Status: None (Preliminary result)   Collection Time: 01/03/18 11:21 AM  Result Value Ref Range Status   Specimen Description   Final    BLOOD RIGHT ANTECUBITAL Blood Culture adequate volume BOTTLES DRAWN AEROBIC AND ANAEROBIC Performed at [facility] [address] [place], [place] [address]    Special Requests   Final    NONE Performed at [facility], [facility] [address], [place], [place] [address]    Culture   Final    NO GROWTH 4 DAYS Performed at [facility], [facility] [address] [place], [place] [address]    Report Status PENDING  Incomplete  MRSA PCR Screening     Status: None   Collection Time: 01/03/18  6:37 PM  Result Value Ref Range Status   MRSA by PCR NEGATIVE NEGATIVE Final    Comment:        The GeneXpert MRSA Assay (FDA approved for NASAL specimens only), is one component of a comprehensive MRSA colonization surveillance program. It is not intended to diagnose MRSA infection nor to guide or monitor treatment for MRSA infections. Performed at [facility], [facility] [address], [place], [place] [address]       Studies:  Dg Chest Port 1 View  Result Date: 01/07/2018 CLINICAL DATA:  Respiratory failure EXAM: PORTABLE CHEST 1 VIEW COMPARISON:  01/03/2018 FINDINGS: Cardiac shadow is stable. Endotracheal tube and nasogastric catheter have been removed in the interval. The lungs are hypoinflated with very minimal atelectatic changes in the left base. No sizable effusion or infiltrate is noted. No bony abnormality is seen. A vial is noted over the left upper quadrant consistent with extrinsic artifact. IMPRESSION: Minimal left basilar atelectasis related to poor inspiratory effort. Electronically Signed    By: [facility] M.D.   On: 01/07/2018 08:05    Assessment: 37 y.o. 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 I am writing for neupogen which will speed up granulocyte recovery. The neupogen may be discontinued once the absolute neutrophil count rises above 1.0.  Once the patient attains full recovery the question whether any further chemotherapy should be considered can be addressed. At this [facility] I would expect no further chemotherapy in the future so as far as his testicular cancer is concerned all he will need is observation. As previously noted, he has an excellent chance of cure as far as his cancer is concerned.  I will be out of unavailable until 01/12/2018. Please consult my partners as needed   Chauncey Cruel, MD 01/07/2018  11:11 AM Medical Oncology and Hematology [facility] [address] [place], [place] [address] Tel. [phone]    Fax. [phone]

## 2018-01-07 NOTE — Progress Notes (Signed)
Initial Nutrition Assessment  DOCUMENTATION CODES:   Not applicable  INTERVENTION:   Initiate TF via Cortrak tube  Osmolite 1.2 start @ 30 ml/hr and increase by 10 ml every 12 hours to goal rate of 70 ml/hr 30 ml Prostat daily  Provides: 2116 kcal, 108 grams protein, and 1362 ml free water.   Noted electrolytes are low and being repleated.  Monitor magnesium and phosphorus every 12 hours x 4 occurences, MD to replete as needed.   NUTRITION DIAGNOSIS:   Inadequate oral intake related to inability to eat as evidenced by NPO status.  GOAL:   Patient will meet greater than or equal to 90% of their needs  MONITOR:   TF tolerance, Labs  REASON FOR ASSESSMENT:   Consult Enteral/tube feeding initiation and management  ASSESSMENT:   Pt with PMH of testicular cancer with metastasis to lymph - s/p right radical orchiectomy 06/17/17 and retroperitoneal lyph node dissection 11/02/17.  Now s/p 1 of 2 planned cycles of cisplatin / etoposide (12/19/17 through 01/01/18 at [facility]). 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   Pt discussed during ICU rounds and with RN.  11/26 extubated  11/27 Cortrak, tip post pyloric per Cortrak team  Pt is not alert and is not able to answer any questions. Mom at bedside. She reports that pt seems to have lost a few pounds but has always been small. He does not live with her so she is unsure of his weight or intake hx.   Medications reviewed and include: 40 mEq KCl every 4 hrs (d/c today), 20 mmol Kphos x 1 Labs reviewed: K+ 2.4 (L), Na 133 (L), PO4: 2.3 (L), magnesium 1.6 (L)  NUTRITION - FOCUSED PHYSICAL EXAM:    Most Recent Value  Orbital Region  No depletion  Upper Arm Region  No depletion  Thoracic and Lumbar Region  No depletion  Buccal Region  No depletion  Temple Region  Mild depletion  Clavicle Bone Region  No depletion  Clavicle and Acromion Bone Region  No depletion  Scapular Bone Region  No depletion   Dorsal Hand  No depletion  Patellar Region  No depletion  Anterior Thigh Region  No depletion  Edema (RD Assessment)  None  Hair  Reviewed  Eyes  Reviewed  Mouth  Unable to assess  Skin  Reviewed  Nails  Reviewed       Diet Order:   Diet Order            Diet NPO time specified  Diet effective now              EDUCATION NEEDS:   No education needs have been identified at this time  Skin:  Skin Assessment: Reviewed RN Assessment  Last BM:  11/27  Height:   Ht Readings from Last 1 Encounters:  01/03/18 5\' 11"  (1.803 m)    Weight:   Wt Readings from Last 1 Encounters:  01/07/18 67 kg    Ideal Body Weight:  78.1 kg  BMI:  Body mass index is 20.6 kg/m.  Estimated Nutritional Needs:   Kcal:  2100-2300  Protein:  100-125 grams  Fluid:  >2.1 L/day  Maylon Peppers RD, LDN, CNSC [phone] Pager [phone] After Hours Pager

## 2018-01-07 NOTE — Plan of Care (Signed)
Patient had cortrak placed and was started on Osmolite 1.2 tube feed today.  PT had patient stand at bedside.  Rectal pouch was placed because of frequent loose bowel movements.  Will further assess patient's swallowing later in the week.  CIR consulted for appropriateness. [place], [place]

## 2018-01-07 NOTE — Progress Notes (Signed)
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 Presents on 11/23 with nausea, altered mental status, dystonic posturing.  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   Vented 11/23.     PT Comments    Patient seen for activity progression. Tolerated session well with improved ability to engage. Patient able to perform OOB activity with moderate assist to power to standing with face to face and gait belt. blocking of LLE noted. Reliance on RLE tone during power up. Tolerated extensive time in standing while nursing performed hygiene and pericare. Patient able to follow commands for cervical extension activities and for positioning of Right body during bed level and OOB activities. Continue to feel CIR is most appropriate venue for discharge. Encouraged with patients ability to engage today. Will continue to see and progress as tolerated.  Follow Up Recommendations  CIR     Equipment Recommendations  None recommended by PT(TBD)    Recommendations for Other Services Rehab consult     Precautions / Restrictions Precautions Precautions: Fall Restrictions Weight Bearing Restrictions: No    Mobility  Bed Mobility Overal bed mobility: Needs Assistance Bed Mobility: Supine to Sit;Sit to Supine     Supine to sit: Mod assist;+2 for physical assistance;HOB elevated Sit to supine: Mod assist;+2 for physical assistance;HOB elevated   General bed mobility comments: patient with improved use of right side to assist with task performance  Transfers Overall transfer level: Needs assistance Equipment used: 1 person hand held  assist Transfers: Sit to/from Stand Sit to Stand: Mod assist         General transfer comment: moderate assist to power to standing with face to face and gait belt. blocking of LLE noted. Reliance on RLE tone during power up. Tolerated extensive time in standing while nursing performed hygiene and pericare.  Ambulation/Gait Ambulation/Gait assistance: Total assist Gait Distance (Feet): 1 Feet   Gait Pattern/deviations: Step-to pattern         Stairs             Wheelchair Mobility    Modified Rankin (Stroke Patients Only) Modified Rankin (Stroke Patients Only) Pre-Morbid Rankin Score: No symptoms Modified Rankin: Severe disability     Balance Overall balance assessment: Needs assistance Sitting-balance support: Bilateral upper extremity supported Sitting balance-Leahy Scale: Poor Sitting balance - Comments: moderate assist for sitting balance at EOB with left lateral lean but able to elevate head into cervical extension upon cues                                    Cognition Arousal/Alertness: Awake/alert Behavior During Therapy: Flat affect Overall Cognitive Status: Impaired/Different from baseline Area of Impairment: Following commands;Safety/judgement;Awareness;Problem solving                       Following Commands: Follows one step commands with increased time;Follows one step commands inconsistently Safety/Judgement: Decreased awareness of deficits(R gaze preference)   Problem Solving: Difficulty sequencing General Comments: pt able to follow 1 step commands but unable to initiate L UE/LE      Exercises  General Comments        Pertinent Vitals/Pain Pain Assessment: Faces Faces Pain Scale: Hurts a little bit Pain Location: generalized discomfort  Pain Descriptors / Indicators: Discomfort;Grimacing Pain Intervention(s): Monitored during session    Home Living                      Prior Function             PT Goals (current goals can now be found in the care plan section) Acute Rehab PT Goals Patient Stated Goal: none stated PT Goal Formulation: With patient/family Time For Goal Achievement: 01/20/18 Potential to Achieve Goals: Fair Progress towards PT goals: Progressing toward goals    Frequency    Min 4X/week      PT Plan Current plan remains appropriate    Co-evaluation              AM-PAC PT "6 Clicks" Mobility   Outcome Measure  Help needed turning from your back to your side while in a flat bed without using bedrails?: A Lot Help needed moving from lying on your back to sitting on the side of a flat bed without using bedrails?: A Lot Help needed moving to and from a bed to a chair (including a wheelchair)?: A Lot Help needed standing up from a chair using your arms (e.g., wheelchair or bedside chair)?: Total Help needed to walk in hospital room?: Total Help needed climbing 3-5 steps with a railing? : Total 6 Click Score: 9    End of Session Equipment Utilized During Treatment: Gait belt Activity Tolerance: Patient tolerated treatment well Patient left: in bed;with call bell/phone within reach;with nursing/sitter in room(in chair position) Nurse Communication: Mobility status PT Visit Diagnosis: Difficulty in walking, not elsewhere classified (R26.2);Hemiplegia and hemiparesis Hemiplegia - Right/Left: Left Hemiplegia - caused by: Nontraumatic intracerebral hemorrhage     Time: 6812-7517 PT Time Calculation (min) (ACUTE ONLY): 21 min  Charges:  $Therapeutic Activity: 8-22 mins                     Alben Deeds, PT DPT  Board Certified Neurologic Specialist [place] Pager [phone] Office [place] 01/07/2018, 5:32 PM

## 2018-01-07 NOTE — Progress Notes (Signed)
IP rehab admissions - I am following for potential acute inpatient rehab admission.  I will watch for therapy participation and tolerance.  Call me for questions.  [phone]

## 2018-01-08 DIAGNOSIS — E876 Hypokalemia: Secondary | ICD-10-CM

## 2018-01-08 DIAGNOSIS — C801 Malignant (primary) neoplasm, unspecified: Secondary | ICD-10-CM

## 2018-01-08 LAB — CBC WITH DIFFERENTIAL/PLATELET
Abs Immature Granulocytes: 0 10*3/uL (ref 0.00–0.07)
Basophils Absolute: 0 10*3/uL (ref 0.0–0.1)
Basophils Relative: 0 %
EOS PCT: 0 %
Eosinophils Absolute: 0 10*3/uL (ref 0.0–0.5)
HEMATOCRIT: 23.3 % — AB (ref 39.0–52.0)
Hemoglobin: 8.1 g/dL — ABNORMAL LOW (ref 13.0–17.0)
Immature Granulocytes: 0 %
LYMPHS PCT: 58 %
Lymphs Abs: 0.4 10*3/uL — ABNORMAL LOW (ref 0.7–4.0)
MCH: 31.4 pg (ref 26.0–34.0)
MCHC: 34.8 g/dL (ref 30.0–36.0)
MCV: 90.3 fL (ref 80.0–100.0)
MONO ABS: 0.1 10*3/uL (ref 0.1–1.0)
Monocytes Relative: 10 %
Neutro Abs: 0.2 10*3/uL — ABNORMAL LOW (ref 1.7–7.7)
Neutrophils Relative %: 32 %
Platelets: 86 10*3/uL — ABNORMAL LOW (ref 150–400)
RBC: 2.58 MIL/uL — AB (ref 4.22–5.81)
RDW: 14.5 % (ref 11.5–15.5)
WBC: 0.7 10*3/uL — AB (ref 4.0–10.5)
nRBC: 0 % (ref 0.0–0.2)

## 2018-01-08 LAB — BASIC METABOLIC PANEL
Anion gap: 12 (ref 5–15)
Anion gap: 10 (ref 5–15)
BUN: 15 mg/dL (ref 6–20)
BUN: 15 mg/dL (ref 6–20)
CALCIUM: 8.8 mg/dL — AB (ref 8.9–10.3)
CO2: 24 mmol/L (ref 22–32)
CO2: 27 mmol/L (ref 22–32)
Calcium: 8.9 mg/dL (ref 8.9–10.3)
Chloride: 97 mmol/L — ABNORMAL LOW (ref 98–111)
Chloride: 99 mmol/L (ref 98–111)
Creatinine, Ser: 0.96 mg/dL (ref 0.61–1.24)
Creatinine, Ser: 0.85 mg/dL (ref 0.61–1.24)
GFR calc Af Amer: >60 mL/min (ref >60)
GFR calc non Af Amer: >60 mL/min (ref >60)
GLUCOSE: 117 mg/dL — AB (ref 70–99)
Glucose, Bld: 102 mg/dL — ABNORMAL HIGH (ref 70–99)
POTASSIUM: 2.9 mmol/L — AB (ref 3.5–5.1)
Potassium: 2.5 mmol/L — CL (ref 3.5–5.1)
SODIUM: 133 mmol/L — AB (ref 135–145)
Sodium: 136 mmol/L (ref 135–145)

## 2018-01-08 LAB — CULTURE, BLOOD (ROUTINE X 2)
CULTURE: NO GROWTH
Culture: NO GROWTH

## 2018-01-08 LAB — GLUCOSE, CAPILLARY
Glucose-Capillary: 125 mg/dL — ABNORMAL HIGH (ref 70–99)
Glucose-Capillary: 120 mg/dL — ABNORMAL HIGH (ref 70–99)
Glucose-Capillary: 115 mg/dL — ABNORMAL HIGH (ref 70–99)
Glucose-Capillary: 92 mg/dL (ref 70–99)
Glucose-Capillary: 111 mg/dL — ABNORMAL HIGH (ref 70–99)
Glucose-Capillary: 126 mg/dL — ABNORMAL HIGH (ref 70–99)

## 2018-01-08 LAB — PHOSPHORUS: PHOSPHORUS: 2 mg/dL — AB (ref 2.5–4.6)

## 2018-01-08 LAB — MAGNESIUM: MAGNESIUM: 1.9 mg/dL (ref 1.7–2.4)

## 2018-01-08 LAB — CALCIUM, IONIZED: Calcium, Ionized, Serum: 4.4 mg/dL — ABNORMAL LOW (ref 4.5–5.6)

## 2018-01-08 MED ORDER — SENNOSIDES-DOCUSATE SODIUM 8.6-50 MG PO TABS: 1.0000 | ORAL_TABLET | Freq: Every evening | ORAL | Status: DC | PRN

## 2018-01-08 MED ORDER — POTASSIUM CHLORIDE 10 MEQ/100ML IV SOLN
10.0000 meq | INTRAVENOUS | Status: AC
  Administered 2018-01-08 (×6): 10 meq via INTRAVENOUS
  Filled 2018-01-08 (×6): qty 100

## 2018-01-08 MED ORDER — ORAL CARE MOUTH RINSE
15.0000 mL | Freq: Two times a day (BID) | OROMUCOSAL | Status: DC
  Administered 2018-01-08 – 2018-01-12 (×6): 15 mL via OROMUCOSAL

## 2018-01-08 MED ORDER — CHLORHEXIDINE GLUCONATE 0.12 % MT SOLN
15.0000 mL | Freq: Two times a day (BID) | OROMUCOSAL | Status: DC
  Administered 2018-01-08 – 2018-01-14 (×13): 15 mL via OROMUCOSAL
  Filled 2018-01-08 (×15): qty 15

## 2018-01-08 MED ORDER — LOPERAMIDE HCL 1 MG/7.5ML PO SUSP
2.0000 mg | ORAL | Status: DC | PRN
  Administered 2018-01-09: 2 mg
  Filled 2018-01-08 (×3): qty 15

## 2018-01-08 MED ORDER — POTASSIUM CHLORIDE 20 MEQ/15ML (10%) PO SOLN
40.0000 meq | Freq: Three times a day (TID) | ORAL | Status: DC
  Administered 2018-01-08 – 2018-01-10 (×6): 40 meq
  Filled 2018-01-08 (×6): qty 30

## 2018-01-08 MED ORDER — LOPERAMIDE HCL 1 MG/7.5ML PO SUSP
2.0000 mg | ORAL | Status: DC | PRN
  Administered 2018-01-08: 2 mg via ORAL
  Filled 2018-01-08: qty 15

## 2018-01-08 NOTE — Progress Notes (Signed)
STROKE TEAM PROGRESS NOTE   SUBJECTIVE (INTERVAL HISTORY) His RN is at the bedside. WBC slightly higher today. On neutropenic precautions. ET tube has been inserted and tube feedings started.  OBJECTIVE Vitals:   01/08/18 0700 01/08/18 0800 01/08/18 0907 01/08/18 1200  BP: 130/80  (!) 143/99   Pulse: 92     Resp: (!) 25     Temp:  99.3 F (37.4 C)  98.8 F (37.1 C)  TempSrc:  Axillary  Axillary  SpO2: 100%     Weight:      Height:        CBC:  Recent Labs  Lab 01/07/18 0618 01/08/18 0429  WBC 0.5* 0.7*  NEUTROABS 0.1* 0.2*  HGB 8.0* 8.1*  HCT 23.5* 23.3*  MCV 90.4 90.3  PLT PLATELET CLUMPS NOTED ON SMEAR, UNABLE TO ESTIMATE 86*    Basic Metabolic Panel:  Recent Labs  Lab 01/07/18 0618 01/07/18 1938 01/08/18 0429  NA 133* 133* 133*  K 2.4* 2.8* 2.5*  CL 97* 99 97*  CO2 25 23 24   GLUCOSE 100* 118* 117*  BUN 15 11 15   CREATININE 0.99 0.88 0.96  CALCIUM 8.6* 8.9 8.8*  MG 1.6*  --  1.9  PHOS 2.3*  --  2.0*    Lipid Panel:     Component Value Date/Time   CHOL 204 (H) 01/05/2018 0500   TRIG 285 (H) 01/07/2018 0618   HDL NOT REPORTED DUE TO HIGH TRIGLYCERIDES 01/05/2018 0500   CHOLHDL NOT REPORTED DUE TO HIGH TRIGLYCERIDES 01/05/2018 0500   VLDL UNABLE TO CALCULATE IF TRIGLYCERIDE OVER 400 mg/dL 01/05/2018 0500   LDLCALC UNABLE TO CALCULATE IF TRIGLYCERIDE OVER 400 mg/dL 01/05/2018 0500   HgbA1c:  Lab Results  Component Value Date   HGBA1C 6.4 (H) 01/04/2018   Urine Drug Screen:     Component Value Date/Time   LABOPIA NONE DETECTED 01/04/2018 0833   COCAINSCRNUR NONE DETECTED 01/04/2018 0833   LABBENZ NONE DETECTED 01/04/2018 0833   AMPHETMU NONE DETECTED 01/04/2018 0833   THCU POSITIVE (A) 01/04/2018 0833   LABBARB NONE DETECTED 01/04/2018 0833    Alcohol Level No results found for: ETH  IMAGING Ct Head Wo Contrast 01/03/2018 Patchy low-density in the bilateral cerebellum and occipital lobes. This pattern suggests posterior circulation acute  infarcts-consider CTA. Given young age and recent chemotherapy underlying PRES is a consideration-although no symmetric cerebral edema. Metastatic disease with edema is possible but less likely given the distribution.   Ct Angio Head W Or Wo Contrast Ct Angio Neck W And/or Wo Contrast 01/03/2018 1. 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  2. No embolic source seen in the neck, although limited by significant motion artifact.   Mr Brain Wo Contrast 01/03/2018 1. Acute numerous supra- and infratentorial posterior circulation nonhemorrhagic infarcts.  2. 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   Transthoracic Echocardiogram - canceled given TEE results  Bilateral Lower Extremity Venous Dopplers There is no DVT or SVT noted in the bilateral lower extremities.   TEE   Left Ventrical:  65-70%, at least moderate LVH Mitral Valve: normal, no vegetation Aortic Valve: normal, no vegetation Tricuspid Valve: mild TR, no vegetation Pulmonic Valve: trivial PR Left Atrium/ Left atrial appendage: no thrombus Atrial septum: No ASD or PFO Aorta: mild non-mobile atheroma  PHYSICAL EXAM  young African-American gentleman  . Marland Kitchen Afebrile. Head is nontraumatic. Neck is supple without bruit.    Cardiac exam no murmur or gallop. Lungs are clear to auscultation. Distal pulses are well felt. Neurological Exam :  Awake alert  and is not very cooperative. Keep his eyes closed. Extraocular moments are full range with saccadic dysmetria bilaterally. Blinks to threat bilaterally. Left lower facial weakness. Tongue midline. Weak cough and gag. Left hemiplegia but is able to move fingers and mild left grip. Trace withdrawal in the left lower extremity. Normal  strength on the right side. Sensation appears diminished on the left side. Reflexes are depressed on the left normal on the right. Left plantar upgoing right downgoing. Gait not tested.   ASSESSMENT/PLAN [name] is a 37 y.o. male with history of renal insufficiency and testicular cancer with lymph node metastasis who presented to the [facility] ED this morning with AMS, rigors and dystonic posturing. He did not receive IV t-PA due to unknown time of onset.  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  MRI head - Acute numerous supra - and infratentorial posterior circulation nonhemorrhagic infarcts.   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   TEE - EF  65% to 70%. No PFO. No SOE  LE Dopplers negative  LDL - unable to calculate due to TG 1,966 on Clevipex and propofol. Repeat TG 1449  HgbA1c - 6.4  No need for EEG  UDS - THC positive  VTE prophylaxis - heparin subq, d/c SCDs  Diet - NPO - hold given planned extubation  No antithrombotic prior to admission, now on aspirin 81 mg daily and Brilinta 90 mg BID given stent   Therapy recommendations:  pending  Disposition:  Pending  Right testicular tumor  Followed by Dr. Ron Hubbard w/ oncology  With lymph node metastasis  S/p orchidectomy and currently on chemo with cisplatin and etoposide with 1 more dose due in Dec  Dr. Jana Hubbard does not feel likely to be hypercoagulable state from testicular tumor or from cistplatin / etoposide - he recommended continued embolic workup  Acute respiratory failure  Intubated on sedation for airway protection  CCM on board  plan extubation  today  Hypertension  Stable . On cleviprex . Increase SBP goal 110-180 . on multiple BP meds including amlodipine, chlorthalidone, clonidine  . Wean off cleviprex as able . Long-term BP goal normotensive  Hyperlipidemia  Lipid lowering medication PTA:  none  LDL unable to calculate due to high TG, goal < 70  TG 1966 likely contamination from propofol and cleviprex  Repeat lipid 1449   Current lipid lowering medication: none  Add statin once able to swallow  Tobacco abuse  Current cigar smoker  Smoking cessation counseling will be provided  Leukocytosis with low grade fever  Felt to be due to chemo  WBC 15.1->3.6 -> 3.8 -> 0.9   Tmax 98.9  CXR no active disease  Received 1-2 doses of vanco, rocephin and acyclovir - all stopped  Other Stroke Risk Factors  THC user - UDS positive for THC  Other Active Problems  Hemoglobin 12.0->19.1->10.9->9.2  Hyponatremia Na - 132->136->134  Hypokalemia 2.8 - replaced->3.5  Hypocalcemia->8.1  Pancytopenia-likely chemotherapy effect-- started  Neupogen.  Hospital day # 5   I discussed with Dr. Chase Caller.Continue panda tube for nutrition and medications. Mobilize out of bed. Therapy and rehabilitation consults. Continue Neupogen and neutropenic precautions.This   patient is critically ill and at significant risk of neurological worsening, death and care requires constant monitoring of vital signs, hemodynamics,respiratory and cardiac monitoring, extensive review of multiple databases, frequent neurological assessment, discussion with family, other specialists and medical decision making of high complexity.I have made any additions or clarifications directly to the above note.This critical care time does not reflect procedure time, or teaching time or supervisory time of PA/NP/Med Resident etc but could involve care discussion time.  I spent 35 minutes of neurocritical care time  in the care of  this patient.      Antony Contras, MD Medical Director [facility] Pager: [phone] 01/08/2018 12:53 PM     To contact Stroke Continuity provider, please refer to [email or web address] After hours, contact General Neurology

## 2018-01-08 NOTE — Consult Note (Signed)
[facility] TEAM 1 - Stepdown/ICU TEAM  MEDICAL CONSULTATION NOTE   Caleb Hubbard  OVF:[id number] DOB: Nov 23, 1980 DOA: 01/03/2018 PCP: Care, [facility]    Brief Narrative:  37 yo male who presented w/ nausea, altered mental status, and dystonic posturing. His w/u revealed B cerebellar, brainstem, and occipital infarcts from a basilar artery occlusion. He underwent revascularization with Neuro IR, and remained on a vent after the procedure.  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  Significant Events: 11/23  IR cerebral arteriogram with revascularization - intubated  11/27 extubated 11/28 TRH asked to assume med consult by PCCM   Subjective: Patient is sitting up in a bedside chair.  He does not answer my questions and cannot provide a detailed history.  He does not appear to be uncomfortable.  His breathing is stable.  There is no evidence of uncontrolled pain.  He has disconnected his own tube feeding line and his nurse is currently attending to this issue.  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 Care per Neurology - plan for Inpatient Rehab - continue ASA, brilinta  Hypertension BP well controlled at this time   Chemotherapy induced leukopenia - Testicular CA w/ nodal mets  Oncology/Hematology following - cont neutropenic precautions - Granix given 11/26, w/ re-dose 11/27   Hypokalemia Persists - increase replacement and follow  Hypomagnesemia  Corrected w/ supplementation   Dysphagia s/p CVA Feeding per [place] - SLP following   Tobacco abuse  DVT prophylaxis: SQ heparin  Code Status: FULL CODE Family Communication: no family present at time of exam   Antimicrobials:  none   Objective: Blood pressure (!) 143/99, pulse 92, temperature 98.8 F (37.1 C), temperature source Axillary, resp. rate  (!) 25, height 5\' 11"  (1.803 m), weight 60.8 kg, SpO2 100 %.  Intake/Output Summary (Last 24 hours) at 01/08/2018 1439 Last data filed at 01/08/2018 0300 Gross per 24 hour  Intake 535.92 ml  Output 1890 ml  Net -1354.08 ml   Filed Weights   01/03/18 1845 01/07/18 0500 01/08/18 0441  Weight: 67.1 kg 67 kg 60.8 kg    Examination: General: No acute respiratory distress Lungs: Clear to auscultation bilaterally without wheezes or crackles Cardiovascular: RRR - no M  Abdomen: NT/ND, soft, bowel sounds positive, no rebound, no ascites Extremities: No signif edema bilateral lower extremities  CBC: Recent Labs  Lab 01/06/18 0535 01/07/18 0618 01/08/18 0429  WBC 0.9* 0.5* 0.7*  NEUTROABS 0.5* 0.1* 0.2*  HGB 9.2* 8.0* 8.1*  HCT 26.8* 23.5* 23.3*  MCV 89.6 90.4 90.3  PLT 150 PLATELET CLUMPS NOTED ON SMEAR, UNABLE TO ESTIMATE 86*   Basic Metabolic Panel: Recent Labs  Lab 01/06/18 0535 01/06/18 0851 01/07/18 0618 01/07/18 1938 01/08/18 0429  NA 134*  --  133* 133* 133*  K 3.5  --  2.4* 2.8* 2.5*  CL 103  --  97* 99 97*  CO2 21*  --  25 23 24   GLUCOSE 114*  --  100* 118* 117*  BUN 16  --  15 11 15   CREATININE 1.15  --  0.99 0.88 0.96  CALCIUM 8.1*  --  8.6* 8.9 8.8*  MG  --  1.6* 1.6*  --  1.9  PHOS 3.6  --  2.3*  --  2.0*   GFR: Estimated Creatinine Clearance: 91.5 mL/min (by C-G formula based on SCr  of 0.96 mg/dL).  Liver Function Tests: Recent Labs  Lab 01/03/18 1120 01/05/18 0500 01/06/18 0535  AST 20  --   --   ALT 12  --   --   ALKPHOS 63  --   --   BILITOT 1.0  --   --   PROT 7.1  --   --   ALBUMIN 4.0 2.5* 2.5*   Recent Labs  Lab 01/03/18 1120  LIPASE 22    Coagulation Profile: Recent Labs  Lab 01/03/18 1120  INR 1.16    Cardiac Enzymes: Recent Labs  Lab 01/03/18 1120 01/03/18 1206  CKTOTAL 119  --   TROPONINI  --  <0.03    HbA1C: Hgb A1c MFr Bld  Date/Time Value Ref Range Status  01/04/2018 03:45 AM 6.4 (H) 4.8 - 5.6 % Final     Comment:    (NOTE) Pre diabetes:          5.7%-6.4% Diabetes:              >6.4% Glycemic control for   <7.0% adults with diabetes     CBG: Recent Labs  Lab 01/07/18 2023 01/07/18 2356 01/08/18 0341 01/08/18 0755 01/08/18 1154  GLUCAP 112* 146* 125* 120* 115*    Recent Results (from the past 240 hour(s))  Blood Culture (routine x 2)     Status: None   Collection Time: 01/03/18 11:20 AM  Result Value Ref Range Status   Specimen Description   Final    LEFT ANTECUBITAL Blood Culture adequate volume BOTTLES DRAWN AEROBIC ONLY Performed at [facility] [address], [place], [place] [address]    Special Requests   Final    NONE Performed at [facility], [place] [address], [place], [place] [address]    Culture   Final    NO GROWTH 5 DAYS Performed at [facility], [place] [address], [place], [place] [address]    Report Status 01/08/2018 FINAL  Final  Blood Culture (routine x 2)     Status: None   Collection Time: 01/03/18 11:21 AM  Result Value Ref Range Status   Specimen Description   Final    BLOOD RIGHT ANTECUBITAL Blood Culture adequate volume BOTTLES DRAWN AEROBIC AND ANAEROBIC Performed at [facility] [address] [place], [place] [address]    Special Requests   Final    NONE Performed at [facility], [place] [address], [place], [place] [address]    Culture   Final    NO GROWTH 5 DAYS Performed at [facility], [place] [address] [place], [place] [address]    Report Status 01/08/2018 FINAL  Final  MRSA PCR Screening     Status: None   Collection Time: 01/03/18  6:37 PM  Result Value Ref Range Status   MRSA by PCR NEGATIVE NEGATIVE Final    Comment:        The GeneXpert MRSA Assay (FDA approved for NASAL specimens only), is one component of a comprehensive MRSA colonization surveillance program. It is not intended to diagnose MRSA infection nor to guide or monitor  treatment for MRSA infections. Performed at [facility], [place] [address], [place], [place] [address]      Scheduled Meds: . amLODipine  10 mg Per Tube Daily  . aspirin  81 mg Oral Daily   Or  . aspirin  81 mg Per Tube Daily  . chlorhexidine  15 mL Mouth Rinse  BID  . chlorthalidone  25 mg Per Tube Daily  . cloNIDine  0.2 mg Per Tube BID  . feeding supplement (PRO-STAT SUGAR FREE 64)  30 mL Per Tube Daily  . heparin injection (subcutaneous)  5,000 Units Subcutaneous Q8H  . mouth rinse  15 mL Mouth Rinse q12n4p  . pantoprazole sodium  40 mg Per Tube Q24H  . Tbo-filgastrim (GRANIX) SQ  480 mcg Subcutaneous Daily  . ticagrelor  90 mg Oral BID   Or  . ticagrelor  90 mg Per Tube BID     LOS: 5 days   Cherene Altes, MD [facility] Office  [phone] Pager - Text Page per Amion  If 7PM-7AM, please contact night-coverage per Amion 01/08/2018, 2:39 PM

## 2018-01-09 ENCOUNTER — Inpatient Hospital Stay (HOSPITAL_COMMUNITY): Payer: Medicaid Other

## 2018-01-09 LAB — CBC WITH DIFFERENTIAL/PLATELET
Basophils Absolute: 0 10*3/uL (ref 0.0–0.1)
Basophils Relative: 1 %
Eosinophils Absolute: 0 10*3/uL (ref 0.0–0.5)
Eosinophils Relative: 0 %
HCT: 21.9 % — ABNORMAL LOW (ref 39.0–52.0)
Hemoglobin: 7.7 g/dL — ABNORMAL LOW (ref 13.0–17.0)
Lymphocytes Relative: 58 %
Lymphs Abs: 0.5 10*3/uL — ABNORMAL LOW (ref 0.7–4.0)
MCH: 31.8 pg (ref 26.0–34.0)
MCHC: 35.2 g/dL (ref 30.0–36.0)
MCV: 90.5 fL (ref 80.0–100.0)
Monocytes Absolute: 0.1 10*3/uL (ref 0.1–1.0)
Monocytes Relative: 13 %
Neutro Abs: 0.2 10*3/uL — ABNORMAL LOW (ref 1.7–7.7)
Neutrophils Relative %: 28 %
PLATELETS: 75 10*3/uL — AB (ref 150–400)
RBC: 2.42 MIL/uL — ABNORMAL LOW (ref 4.22–5.81)
RDW: 14.6 % (ref 11.5–15.5)
WBC Morphology: ABNORMAL
WBC: 0.8 10*3/uL — CL (ref 4.0–10.5)
nRBC: 0 % (ref 0.0–0.2)

## 2018-01-09 LAB — COMPREHENSIVE METABOLIC PANEL
ALT: 64 U/L — AB (ref 0–44)
AST: 71 U/L — ABNORMAL HIGH (ref 15–41)
Albumin: 2.9 g/dL — ABNORMAL LOW (ref 3.5–5.0)
Alkaline Phosphatase: 83 U/L (ref 38–126)
Anion gap: 9 (ref 5–15)
BUN: 15 mg/dL (ref 6–20)
CO2: 23 mmol/L (ref 22–32)
Calcium: 8.8 mg/dL — ABNORMAL LOW (ref 8.9–10.3)
Chloride: 101 mmol/L (ref 98–111)
Creatinine, Ser: 0.85 mg/dL (ref 0.61–1.24)
Glucose, Bld: 141 mg/dL — ABNORMAL HIGH (ref 70–99)
Potassium: 2.8 mmol/L — ABNORMAL LOW (ref 3.5–5.1)
Sodium: 133 mmol/L — ABNORMAL LOW (ref 135–145)
Total Bilirubin: 0.7 mg/dL (ref 0.3–1.2)
Total Protein: 6.5 g/dL (ref 6.5–8.1)

## 2018-01-09 LAB — CARDIOLIPIN ANTIBODIES, IGG, IGM, IGA: Anticardiolipin IgA: <9 APL U/mL (ref 0–11)

## 2018-01-09 LAB — GLUCOSE, CAPILLARY
Glucose-Capillary: 144 mg/dL — ABNORMAL HIGH (ref 70–99)
Glucose-Capillary: 140 mg/dL — ABNORMAL HIGH (ref 70–99)
Glucose-Capillary: 139 mg/dL — ABNORMAL HIGH (ref 70–99)
Glucose-Capillary: 88 mg/dL (ref 70–99)

## 2018-01-09 LAB — MAGNESIUM: Magnesium: 1.8 mg/dL (ref 1.7–2.4)

## 2018-01-09 MED ORDER — POTASSIUM CHLORIDE 20 MEQ/15ML (10%) PO SOLN
40.0000 meq | ORAL | Status: AC
  Administered 2018-01-09 (×3): 40 meq
  Filled 2018-01-09 (×3): qty 30

## 2018-01-09 MED ORDER — RESOURCE THICKENUP CLEAR PO POWD
ORAL | Status: DC | PRN
  Filled 2018-01-09: qty 125

## 2018-01-09 MED ORDER — INSULIN ASPART 100 UNIT/ML ~~LOC~~ SOLN
4.0000 [IU] | Freq: Once | SUBCUTANEOUS | Status: AC
  Administered 2018-01-09: 4 [IU] via SUBCUTANEOUS

## 2018-01-09 NOTE — Progress Notes (Signed)
Modified Barium Swallow Progress Note  Patient Details  Name: Cordell Guercio MRN: [id number] Date of Birth: 1981/01/26  Today's Date: 01/09/2018  Modified Barium Swallow completed.  Full report located under Chart Review in the Imaging Section.  Brief recommendations include the following:  Clinical Impression  Pt demonstrates a primary oral dysphagia following bialteral brain stem infarcts. Deficits are moderate and related to fine motor control of articulators with some involuntary responses (jaw jerk, yawning) observed that interfere with function. There is also concern for decreased pharygneal sensation, though this may be related to laryngeal recovery following intubation than neurogenic cause. Best method to reduce anterior spill and facilitate pt control is straw sips, pt utilizes gradual repetitive lingual pumping to transit boluses resulting in premature spill and delayed swallow response. Though swallow trigger is otherwise strong and WNL, when taking thin liquids due to spill and delayed initiation, regardless of bolus size there is repeated aspiration before the swallow with quantity dependent sensation. Cough is weak and is only involuntary (pt has still not phonated or coughed on command). Recommend pt initaite a dys 2 diet given slow mastication with decreased rotatary movement as well as nectar thick liquids. Pt could not orally transit a pill. Will f/u for interventions to advance diet and facilitate intake.    Swallow Evaluation Recommendations       SLP Diet Recommendations: Dysphagia 2 (Fine chop) solids;Nectar thick liquid   Liquid Administration via: Straw   Medication Administration: Crushed with puree   Supervision: Staff to assist with self feeding;Full supervision/cueing for compensatory strategies   Compensations: Slow rate;Small sips/bites;Minimize environmental distractions   Postural Changes: Seated upright at 90 degrees   Oral Care Recommendations: Oral care  BID   Other Recommendations: Order thickener from pharmacy;Have oral suction available   [name], MA [place] Pager [phone] Office [phone]  Lynann Beaver 01/09/2018,11:08 AM

## 2018-01-09 NOTE — Progress Notes (Signed)
PT Cancellation Note  Patient Details Name: Caleb Hubbard MRN: [id number] DOB: 10-10-1980   Cancelled Treatment:    Reason Eval/Treat Not Completed: Other (comment).  RN tech going in room with towels and wash cloths.  Pt is covered in feces.  Pt to check back later as time allows.    Thanks,    Barbarann Ehlers. Contrina Orona, PT, DPT  Acute Rehabilitation [phone] pager #[phone] office   01/09/2018, 5:59 PM

## 2018-01-09 NOTE — Progress Notes (Signed)
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 He underwent revascularization with Neuro IR, and remained on a vent after the procedure.  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 Nutrition -CIR evaluating needs aggressive rehabilitation,  -Transfer out of ICU today  Right testicular tumor with nodal metastasis -Dr. Jana Hakim oncology following -Status post orchiectomy, currently on chemo with cisplatin and etoposide -Oncology does not feel he is hypercoagulable from testicular tumor or  chemo  Acute respiratory failure -Intubated for airway protection -Extubated 11/27 -Was followed by PCCM  Neutropenia -Due to chemotherapy -Continued daily Granix until Page >1000  Hypertension -Now on amlodipine, clonidine, chlorthalidone  Tobacco abuse  Diarrhea -Likely due to tube feeds, Imodium as needed  Hypokalemia -Due to tube feeds, replace aggressively mag is 1.9  DVT prophylaxis: SQ heparin  Code Status: FULL CODE Family Communication:  Grandmother at bedside Consultants:   Neuro   Procedures: 11/23 revascularization with TICI to be followed by rescue stent at the site of severe focal stenosis in mid basilar artery  Antimicrobials:    Subjective: -No changes, remains profoundly aphasic -Follows commands, having some diarrhea from tube feeds  Objective: Vitals:   01/09/18 0500 01/09/18 0700 01/09/18 0800 01/09/18 0900  BP: (!) 150/85 135/82 (!) 152/104 134/89  Pulse: (!) 109 86 (!) 119 (!) 119  Resp: (!) 25 18 (!) 28 (!) 28  Temp:   99.3 F (37.4 C)   TempSrc:   Oral   SpO2: 100% 97% 100% 100%  Weight: 61.6 kg     Height:        Intake/Output Summary (Last 24 hours) at 01/09/2018 1133 Last data filed at 01/09/2018 0900 Gross per 24 hour  Intake 4510.3 ml  Output 3475 ml  Net 1035.3 ml   Filed Weights   01/07/18 0500 01/08/18 0441 01/09/18 0500  Weight: 67 kg 60.8 kg 61.6 kg    Examination:  General exam: Eyes open, mute, follows some commands, no distress, cortrak Respiratory system: Clear bilaterally Cardiovascular system: S1 & S2 heard, RRR.  Gastrointestinal system: Abdomen is nondistended, soft and nontender.Normal bowel sounds heard. Central nervous system: Alert awake, follows commands, mostly keeps his eyes closed but opens on command extraocular movements are intact left hemiplegia right arm  and leg is 4+/5 Extremities: No edema Skin: No rashes, lesions or ulcers Psychiatry: Unable to assess    Data Reviewed:    CBC: Recent Labs  Lab 01/05/18 1105 01/06/18 0535 01/07/18 0618 01/08/18 0429 01/09/18 0308  WBC 3.8* 0.9* 0.5* 0.7* 0.8*  NEUTROABS 3.2 0.5* 0.1* 0.2* 0.2*  HGB 10.9* 9.2* 8.0* 8.1* 7.7*  HCT 30.2* 26.8* 23.5* 23.3* 21.9*  MCV 90.4 89.6 90.4 90.3 90.5  PLT 204 150 PLATELET CLUMPS NOTED ON SMEAR, UNABLE TO ESTIMATE 86* 75*   Basic Metabolic Panel: Recent Labs  Lab 01/03/18 1120  01/05/18 0500 01/06/18 0535 01/06/18 0851 01/07/18 0618 01/07/18 1938 01/08/18 0429 01/08/18 1543 01/09/18 0308  NA 140   < > 136 134*  --  133* 133* 133* 136 133*  K 3.4*   < > 2.8* 3.5  --  2.4* 2.8* 2.5* 2.9* 2.8*  CL 100   < > 108 103  --  97* 99 97* 99 101  CO2 26   < > 15* 21*  --  25 23 24 27 23   GLUCOSE 103*   < > 102* 114*  --  100* 118* 117* 102* 141*  BUN 21*   < > 9 16  --  15 11 15 15 15   CREATININE 1.01   < > 0.85 1.15  --  0.99 0.88 0.96 0.85 0.85  CALCIUM 9.7   < > 5.7* 8.1*  --  8.6* 8.9 8.8* 8.9 8.8*  MG 1.8  --   --   --  1.6* 1.6*  --  1.9  --  1.8  PHOS 2.9  --  4.1 3.6  --  2.3*  --  2.0*  --   --    < > = values in this interval not displayed.   GFR: Estimated Creatinine Clearance: 104.7 mL/min (by C-G formula based on SCr of 0.85 mg/dL). Liver Function Tests: Recent Labs  Lab 01/03/18 1120 01/05/18 0500 01/06/18 0535 01/09/18 0308  AST 20  --   --  71*  ALT 12  --   --  64*  ALKPHOS 63  --   --  83  BILITOT 1.0  --   --  0.7  PROT 7.1  --   --  6.5  ALBUMIN 4.0 2.5* 2.5* 2.9*   Recent Labs  Lab 01/03/18 1120  LIPASE 22   No results for input(s): AMMONIA in the last 168 hours. Coagulation Profile: Recent Labs  Lab 01/03/18 1120  INR 1.16   Cardiac Enzymes: Recent Labs  Lab 01/03/18 1120 01/03/18 1206  CKTOTAL 119  --   TROPONINI  --  <0.03   BNP (last 3 results) No results for input(s): PROBNP in the last 8760 hours. HbA1C: No results for input(s): HGBA1C in the last 72 hours. CBG: Recent Labs  Lab 01/08/18 1557 01/08/18 1939  01/08/18 2319 01/09/18 0322 01/09/18 0807  GLUCAP 92 111* 126* 144* 140*   Lipid Profile: Recent Labs    01/07/18 0618  TRIG 285*   Thyroid Function Tests: No results for input(s): TSH, T4TOTAL, FREET4, T3FREE, THYROIDAB in the last 72 hours. Anemia Panel: No results for input(s): VITAMINB12, FOLATE, FERRITIN, TIBC, IRON, RETICCTPCT in the last 72 hours. Urine analysis:    Component Value Date/Time   COLORURINE YELLOW 05/27/2017 [place] 05/27/2017 1159   LABSPEC 1.012 05/27/2017 1159   PHURINE 6.0 05/27/2017 1159   GLUCOSEU NEGATIVE 05/27/2017 1159   HGBUR NEGATIVE 05/27/2017 1159  BILIRUBINUR NEGATIVE 05/27/2017 [place] 05/27/2017 1159   PROTEINUR NEGATIVE 05/27/2017 1159   UROBILINOGEN 0.2 09/13/2013 1330   NITRITE NEGATIVE 05/27/2017 1159   LEUKOCYTESUR NEGATIVE 05/27/2017 1159   Sepsis Labs: @LABRCNTIP (procalcitonin:4,lacticidven:4)  ) Recent Results (from the past 240 hour(s))  Blood Culture (routine x 2)     Status: None   Collection Time: 01/03/18 11:20 AM  Result Value Ref Range Status   Specimen Description   Final    LEFT ANTECUBITAL Blood Culture adequate volume BOTTLES DRAWN AEROBIC ONLY Performed at [facility], [place] [address], [place], [place] [address]    Special Requests   Final    NONE Performed at [facility], [place] [address], [place], [place] [address]    Culture   Final    NO GROWTH 5 DAYS Performed at [facility], [place] [address] [place], [place] [address]    Report Status 01/08/2018 FINAL  Final  Blood Culture (routine x 2)     Status: None   Collection Time: 01/03/18 11:21 AM  Result Value Ref Range Status   Specimen Description   Final    BLOOD RIGHT ANTECUBITAL Blood Culture adequate volume BOTTLES DRAWN AEROBIC AND ANAEROBIC Performed at [facility] [address], [place], [place] [address]    Special Requests   Final     NONE Performed at [facility], [place] [address], [place], [place] [address]    Culture   Final    NO GROWTH 5 DAYS Performed at [facility], [place] [address], [place], [place] [address]    Report Status 01/08/2018 FINAL  Final  MRSA PCR Screening     Status: None   Collection Time: 01/03/18  6:37 PM  Result Value Ref Range Status   MRSA by PCR NEGATIVE NEGATIVE Final    Comment:        The GeneXpert MRSA Assay (FDA approved for NASAL specimens only), is one component of a comprehensive MRSA colonization surveillance program. It is not intended to diagnose MRSA infection nor to guide or monitor treatment for MRSA infections. Performed at [facility], [place] [address], [place], [place] [address]          Radiology Studies: No results found.      Scheduled Meds: . amLODipine  10 mg Per Tube Daily  . aspirin  81 mg Per Tube Daily  . chlorhexidine  15 mL Mouth Rinse BID  . chlorthalidone  25 mg Per Tube Daily  . cloNIDine  0.2 mg Per Tube BID  . feeding supplement (PRO-STAT SUGAR FREE 64)  30 mL Per Tube Daily  . heparin injection (subcutaneous)  5,000 Units Subcutaneous Q8H  . mouth rinse  15 mL Mouth Rinse q12n4p  . pantoprazole sodium  40 mg Per Tube Q24H  . potassium chloride  40 mEq Per Tube TID  . potassium chloride  40 mEq Per Tube Q2H  . Tbo-filgastrim (GRANIX) SQ  480 mcg Subcutaneous Daily  . ticagrelor  90 mg Oral BID   Or  . ticagrelor  90 mg Per Tube BID   Continuous Infusions: . sodium chloride 75 mL/hr at 01/09/18 0900  . feeding supplement (OSMOLITE 1.2 CAL) 70 mL/hr at 01/09/18 0500     LOS: 6 days    Time spent: 77min    Domenic Polite, MD [facility] Page via [email or web address], password [email or web address] After 7PM please contact night-coverage  01/09/2018, 11:33 AM

## 2018-01-09 NOTE — Progress Notes (Signed)
STROKE TEAM PROGRESS NOTE   SUBJECTIVE (INTERVAL HISTORY) His wife is at the bedside. WBC slightly higher today. On neutropenic precautions. Panda tube has been inserted and tube feedings started.speech therapy plans to take him down for modified barium later today.  OBJECTIVE Vitals:   01/09/18 0500 01/09/18 0700 01/09/18 0800 01/09/18 0900  BP: (!) 150/85 135/82 (!) 152/104 134/89  Pulse: (!) 109 86 (!) 119 (!) 119  Resp: (!) 25 18 (!) 28 (!) 28  Temp:   99.3 F (37.4 C)   TempSrc:   Oral   SpO2: 100% 97% 100% 100%  Weight: 61.6 kg     Height:        CBC:  Recent Labs  Lab 01/08/18 0429 01/09/18 0308  WBC 0.7* 0.8*  NEUTROABS 0.2* 0.2*  HGB 8.1* 7.7*  HCT 23.3* 21.9*  MCV 90.3 90.5  PLT 86* 75*    Basic Metabolic Panel:  Recent Labs  Lab 01/07/18 0618  01/08/18 0429 01/08/18 1543 01/09/18 0308  NA 133*   < > 133* 136 133*  K 2.4*   < > 2.5* 2.9* 2.8*  CL 97*   < > 97* 99 101  CO2 25   < > 24 27 23   GLUCOSE 100*   < > 117* 102* 141*  BUN 15   < > 15 15 15   CREATININE 0.99   < > 0.96 0.85 0.85  CALCIUM 8.6*   < > 8.8* 8.9 8.8*  MG 1.6*  --  1.9  --  1.8  PHOS 2.3*  --  2.0*  --   --    < > = values in this interval not displayed.    Lipid Panel:     Component Value Date/Time   CHOL 204 (H) 01/05/2018 0500   TRIG 285 (H) 01/07/2018 0618   HDL NOT REPORTED DUE TO HIGH TRIGLYCERIDES 01/05/2018 0500   CHOLHDL NOT REPORTED DUE TO HIGH TRIGLYCERIDES 01/05/2018 0500   VLDL UNABLE TO CALCULATE IF TRIGLYCERIDE OVER 400 mg/dL 01/05/2018 0500   LDLCALC UNABLE TO CALCULATE IF TRIGLYCERIDE OVER 400 mg/dL 01/05/2018 0500   HgbA1c:  Lab Results  Component Value Date   HGBA1C 6.4 (H) 01/04/2018   Urine Drug Screen:     Component Value Date/Time   LABOPIA NONE DETECTED 01/04/2018 0833   COCAINSCRNUR NONE DETECTED 01/04/2018 0833   LABBENZ NONE DETECTED 01/04/2018 0833   AMPHETMU NONE DETECTED 01/04/2018 0833   THCU POSITIVE (A) 01/04/2018 0833   LABBARB NONE  DETECTED 01/04/2018 0833    Alcohol Level No results found for: ETH  IMAGING Ct Head Wo Contrast 01/03/2018 Patchy low-density in the bilateral cerebellum and occipital lobes. This pattern suggests posterior circulation acute infarcts-consider CTA. Given young age and recent chemotherapy underlying PRES is a consideration-although no symmetric cerebral edema. Metastatic disease with edema is possible but less likely given the distribution.   Ct Angio Head W Or Wo Contrast Ct Angio Neck W And/or Wo Contrast 01/03/2018 1. 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  2. No embolic source seen in the neck, although limited by significant motion artifact.   Mr Brain Wo Contrast 01/03/2018 1. Acute numerous supra- and infratentorial posterior circulation nonhemorrhagic infarcts.  2. 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   Transthoracic Echocardiogram - canceled given TEE results  Bilateral Lower Extremity Venous Dopplers There is no DVT or SVT noted in the bilateral lower extremities.   TEE   Left Ventrical:  65-70%, at least  moderate LVH Mitral Valve: normal, no vegetation Aortic Valve: normal, no vegetation Tricuspid Valve: mild TR, no vegetation Pulmonic Valve: trivial PR Left Atrium/ Left atrial appendage: no thrombus Atrial septum: No ASD or PFO Aorta: mild non-mobile atheroma                                                                                                                                                                                                         PHYSICAL EXAM  young African-American gentleman  . Marland Kitchen Afebrile. Head is nontraumatic. Neck is supple without bruit.    Cardiac exam no murmur or gallop. Lungs are clear to auscultation. Distal pulses are well felt. Neurological Exam :  Awake alert  and is not very cooperative. Keep his eyes closed. Extraocular moments are full range with saccadic  dysmetria bilaterally. Blinks to threat bilaterally. Left lower facial weakness. Tongue midline. Weak cough and gag. Left hemiplegia but is able to move fingers only. Trace withdrawal in the left lower extremity. Normal strength on the right side. Sensation appears diminished on the left side. Reflexes are depressed on the left normal on the right. Left plantar upgoing right downgoing. Gait not tested.   ASSESSMENT/PLAN Caleb Hubbard is a 37 y.o. male with history of renal insufficiency and testicular cancer with lymph node metastasis who presented to the [facility] ED this morning with AMS, rigors and dystonic posturing. He did not receive IV t-PA due to unknown time of onset.  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  MRI head - Acute numerous supra - and infratentorial posterior circulation nonhemorrhagic infarcts.   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   TEE - EF  65% to 70%. No PFO. No SOE  LE Dopplers negative  LDL - unable to calculate due to TG 1,966 on Clevipex and propofol. Repeat TG 1449  HgbA1c - 6.4  No need for EEG  UDS - THC positive  VTE prophylaxis - heparin subq, d/c SCDs  Diet - NPO - hold given planned extubation  No antithrombotic prior to admission, now on aspirin 81 mg daily and Brilinta 90 mg BID given stent   Therapy recommendations:  pending  Disposition:  Pending  Right testicular tumor  Followed by Dr. Ron Agee w/ oncology  With lymph node metastasis  S/p orchidectomy and currently on chemo with cisplatin and etoposide with 1 more dose due in Dec  Dr. Jana Hakim does not feel likely to be hypercoagulable state from  testicular tumor or from cistplatin / etoposide - he recommended continued embolic workup  Acute respiratory failure  Intubated on sedation for airway protection  CCM on board  plan extubation today  Hypertension  Stable . On cleviprex . Increase SBP goal 110-180 . on multiple BP meds including amlodipine, chlorthalidone, clonidine  . Wean off cleviprex as able . Long-term BP goal normotensive  Hyperlipidemia  Lipid lowering medication PTA:  none  LDL unable to calculate due to high TG, goal < 70  TG 1966 likely contamination from propofol and cleviprex  Repeat lipid 1449   Current lipid lowering medication: none  Add statin once able to swallow  Tobacco abuse  Current cigar smoker  Smoking cessation counseling will be provided  Leukocytosis with low grade fever  Felt to be due to chemo  WBC 15.1->3.6 -> 3.8 -> 0.9   Tmax 98.9  CXR no active disease  Received 1-2 doses of vanco, rocephin and acyclovir - all stopped  Other Stroke Risk Factors  THC user - UDS positive for THC  Other Active Problems  Hemoglobin 12.0->19.1->10.9->9.2  Hyponatremia Na - 132->136->134  Hypokalemia 2.8 - replaced->3.5  Hypocalcemia->8.1  Pancytopenia-likely chemotherapy effect-- started Neupogen.  Hospital day # 6   I discussed with Dr. Broadus John.Continue panda tube for nutrition and medications. Mobilize out of bed. Therapy and rehabilitation consults. Continue Neupogen and neutropenic precautions. Transfer to internal medicine service for neutropenia management. Stroke service will follow. Call for questions. Discussed with patient, wife and Dr. Broadus John. Greater than 50% time during this 35 minute visit was spent on counseling and coordination of care about his stroke, dysphagia and developing plan of care     Antony Contras, [place] Pager: [phone] 01/09/2018 12:32 PM     To contact Stroke Continuity provider, please  refer to [email or web address] After hours, contact General Neurology

## 2018-01-10 LAB — CBC WITH DIFFERENTIAL/PLATELET
Abs Immature Granulocytes: 0.01 10*3/uL (ref 0.00–0.07)
Basophils Absolute: 0 10*3/uL (ref 0.0–0.1)
Basophils Relative: 1 %
Eosinophils Absolute: 0 10*3/uL (ref 0.0–0.5)
Eosinophils Relative: 0 %
HCT: 23.3 % — ABNORMAL LOW (ref 39.0–52.0)
Hemoglobin: 7.8 g/dL — ABNORMAL LOW (ref 13.0–17.0)
Immature Granulocytes: 1 %
Lymphocytes Relative: 40 %
Lymphs Abs: 0.8 10*3/uL (ref 0.7–4.0)
MCH: 31.3 pg (ref 26.0–34.0)
MCHC: 33.5 g/dL (ref 30.0–36.0)
MCV: 93.6 fL (ref 80.0–100.0)
Monocytes Absolute: 0.2 10*3/uL (ref 0.1–1.0)
Monocytes Relative: 10 %
NEUTROS PCT: 48 %
Neutro Abs: 1 10*3/uL — ABNORMAL LOW (ref 1.7–7.7)
Platelets: 106 10*3/uL — ABNORMAL LOW (ref 150–400)
RBC: 2.49 MIL/uL — ABNORMAL LOW (ref 4.22–5.81)
RDW: 15.6 % — ABNORMAL HIGH (ref 11.5–15.5)
WBC: 2 10*3/uL — ABNORMAL LOW (ref 4.0–10.5)
nRBC: 0 % (ref 0.0–0.2)

## 2018-01-10 LAB — BASIC METABOLIC PANEL
Anion gap: 15 (ref 5–15)
BUN: 14 mg/dL (ref 6–20)
CO2: 22 mmol/L (ref 22–32)
Calcium: 9.7 mg/dL (ref 8.9–10.3)
Chloride: 101 mmol/L (ref 98–111)
Creatinine, Ser: 0.86 mg/dL (ref 0.61–1.24)
GFR calc Af Amer: >60 mL/min (ref >60)
Glucose, Bld: 125 mg/dL — ABNORMAL HIGH (ref 70–99)
Potassium: 4.8 mmol/L (ref 3.5–5.1)
Sodium: 138 mmol/L (ref 135–145)

## 2018-01-10 LAB — GLUCOSE, CAPILLARY
Glucose-Capillary: 115 mg/dL — ABNORMAL HIGH (ref 70–99)
Glucose-Capillary: 125 mg/dL — ABNORMAL HIGH (ref 70–99)
Glucose-Capillary: 121 mg/dL — ABNORMAL HIGH (ref 70–99)
Glucose-Capillary: 105 mg/dL — ABNORMAL HIGH (ref 70–99)
Glucose-Capillary: 132 mg/dL — ABNORMAL HIGH (ref 70–99)
Glucose-Capillary: 169 mg/dL — ABNORMAL HIGH (ref 70–99)

## 2018-01-10 MED ORDER — LOPERAMIDE HCL 1 MG/7.5ML PO SUSP
2.0000 mg | Freq: Two times a day (BID) | ORAL | Status: AC
  Administered 2018-01-10 – 2018-01-11 (×4): 2 mg
  Filled 2018-01-10 (×4): qty 15

## 2018-01-10 MED ORDER — OSMOLITE 1.2 CAL PO LIQD
1000.0000 mL | ORAL | Status: DC
  Administered 2018-01-10: 1000 mL
  Filled 2018-01-10: qty 1000

## 2018-01-10 NOTE — Progress Notes (Signed)
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 He underwent revascularization with Neuro IR, and remained on a vent after the procedure.  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  -Neuro interventional radiology consulted, underwent revascularization with TICI 2b followed by rescue stent at the site of severe focal stenosis in mid basilar artery -Stable on aspirin and Brilinta -Unfortunately remains profoundly aphasic, hemiplegic, dysphagia as well -TEE noted EF of 65%, no PFO, Dopplers negative -LDL could not be calculated due to elevated triglycerides, hemoglobin A1c was 6.4 -UDS was positive for [facility] -SLP reevaluated 11/29, started dysphagia 2 diet, cut down tube feeds due to diarrhea -CIR evaluating needs aggressive rehabilitation,   Right testicular tumor with nodal metastasis -Dr. Jana Hakim oncology following -Status post orchiectomy, currently on chemo with cisplatin and etoposide -Oncology does not feel he is hypercoagulable from testicular tumor or chemo  Acute respiratory failure -Intubated for airway protection -Extubated  11/27 -Was followed by PCCM  Neutropenia -Due to chemotherapy -Continued daily Granix until [place] >1000 -WBC is improving, ANC close to 1000, will discontinue Granix tomorrow  Hypertension -Now on amlodipine, clonidine, chlorthalidone  Tobacco abuse  Diarrhea -Likely due to tube feeds, Imodium as needed -cut dpwn TFs  Hypokalemia -Due to tube feeds, replaced  DVT prophylaxis: SQ heparin  Code Status: FULL CODE Family Communication:  Grandmother at bedside yesterday Consultants:   Neuro   Procedures: 11/23 revascularization with TICI to be followed by rescue stent at the site of severe focal stenosis in mid basilar artery  Antimicrobials:    Subjective: -Continues to have diarrhea from tube feeds, no fevers or chills, -Started on a dysphagia 2 diet yesterday  Objective: Vitals:   01/09/18 2332 01/10/18 0341 01/10/18 0748 01/10/18 1033  BP: (!) 166/91 (!) 155/106 (!) 150/90 (!) 168/123  Pulse: (!) 132 (!) 113 (!) 114   Resp: (!) 24  18   Temp: 99.2 F (37.3 C) 99.9 F (37.7 C) 99.1 F (37.3 C)   TempSrc: Oral Axillary Axillary   SpO2: 100% 98% 100%   Weight:      Height:        Intake/Output Summary (Last 24 hours) at 01/10/2018 1210 Last data filed at 01/10/2018 0700 Gross per 24 hour  Intake 1599.98 ml  Output 1625 ml  Net -25.02 ml   Filed Weights   01/08/18 0441 01/09/18 0500 01/09/18 2050  Weight: 60.8 kg 61.6 kg 62.2 kg    Examination:  Gen: Alert awake, aphasic, follows commands, no distress, core track with tube feeds infusing HEENT: PERRLA, Neck supple, no JVD Lungs: Decreased breath sounds at both bases CVS: RRR,No Gallops,Rubs or new Murmurs Abd: soft, Non tender, non distended, BS present Extremities: No edema Skin: no new rashes Central nervous system: Alert  awake, follows commands, mostly keeps his eyes closed but opens on command extraocular movements are intact left hemiplegia, right arm and right lower extremities 4+/5  Data  Reviewed:   CBC: Recent Labs  Lab 01/06/18 0535 01/07/18 0618 01/08/18 0429 01/09/18 0308 01/10/18 0916  WBC 0.9* 0.5* 0.7* 0.8* 2.0*  NEUTROABS 0.5* 0.1* 0.2* 0.2* 1.0*  HGB 9.2* 8.0* 8.1* 7.7* 7.8*  HCT 26.8* 23.5* 23.3* 21.9* 23.3*  MCV 89.6 90.4 90.3 90.5 93.6  PLT 150 PLATELET CLUMPS NOTED ON SMEAR, UNABLE TO ESTIMATE 86* 75* 993*   Basic Metabolic Panel: Recent Labs  Lab 01/05/18 0500 01/06/18 0535 01/06/18 0851 01/07/18 0618 01/07/18 1938 01/08/18 0429 01/08/18 1543 01/09/18 0308 01/10/18 0916  NA 136 134*  --  133* 133* 133* 136 133* 138  K 2.8* 3.5  --  2.4* 2.8* 2.5* 2.9* 2.8* 4.8  CL 108 103  --  97* 99 97* 99 101 101  CO2 15* 21*  --  25 23 24 27 23 22   GLUCOSE 102* 114*  --  100* 118* 117* 102* 141* 125*  BUN 9 16  --  15 11 15 15 15 14   CREATININE 0.85 1.15  --  0.99 0.88 0.96 0.85 0.85 0.86  CALCIUM 5.7* 8.1*  --  8.6* 8.9 8.8* 8.9 8.8* 9.7  MG  --   --  1.6* 1.6*  --  1.9  --  1.8  --   PHOS 4.1 3.6  --  2.3*  --  2.0*  --   --   --    GFR: Estimated Creatinine Clearance: 104.5 mL/min (by C-G formula based on SCr of 0.86 mg/dL). Liver Function Tests: Recent Labs  Lab 01/05/18 0500 01/06/18 0535 01/09/18 0308  AST  --   --  71*  ALT  --   --  64*  ALKPHOS  --   --  83  BILITOT  --   --  0.7  PROT  --   --  6.5  ALBUMIN 2.5* 2.5* 2.9*   No results for input(s): LIPASE, AMYLASE in the last 168 hours. No results for input(s): AMMONIA in the last 168 hours. Coagulation Profile: No results for input(s): INR, PROTIME in the last 168 hours. Cardiac Enzymes: No results for input(s): CKTOTAL, CKMB, CKMBINDEX, TROPONINI in the last 168 hours. BNP (last 3 results) No results for input(s): PROBNP in the last 8760 hours. HbA1C: No results for input(s): HGBA1C in the last 72 hours. CBG: Recent Labs  Lab 01/09/18 1526 01/09/18 2117 01/10/18 0357 01/10/18 0744 01/10/18 1153  GLUCAP 139* 88 115* 125* 121*   Lipid Profile: No results for  input(s): CHOL, HDL, LDLCALC, TRIG, CHOLHDL, LDLDIRECT in the last 72 hours. Thyroid Function Tests: No results for input(s): TSH, T4TOTAL, FREET4, T3FREE, THYROIDAB in the last 72 hours. Anemia Panel: No results for input(s): VITAMINB12, FOLATE, FERRITIN, TIBC, IRON, RETICCTPCT in the last 72 hours. Urine analysis:    Component Value Date/Time   COLORURINE YELLOW 05/27/2017 1159   APPEARANCEUR CLEAR 05/27/2017 1159   LABSPEC 1.012 05/27/2017 1159   PHURINE 6.0 05/27/2017 1159   GLUCOSEU NEGATIVE 05/27/2017 1159   HGBUR NEGATIVE 05/27/2017 1159   BILIRUBINUR NEGATIVE 05/27/2017 1159   KETONESUR NEGATIVE 05/27/2017 1159   PROTEINUR NEGATIVE 05/27/2017 1159   UROBILINOGEN 0.2 09/13/2013 1330   NITRITE NEGATIVE 05/27/2017 1159   LEUKOCYTESUR NEGATIVE 05/27/2017 1159   Sepsis Labs: @LABRCNTIP (procalcitonin:4,lacticidven:4)  ) Recent Results (from the past 240 hour(s))  Blood Culture (routine x 2)  Status: None   Collection Time: 01/03/18 11:20 AM  Result Value Ref Range Status   Specimen Description   Final    LEFT ANTECUBITAL Blood Culture adequate volume BOTTLES DRAWN AEROBIC ONLY Performed at [facility] [address] [place], [place] [address]    Special Requests   Final    NONE Performed at [facility], [place] [address], [place], [place] [address]    Culture   Final    NO GROWTH 5 DAYS Performed at [facility], [place] [address], [place], [place] [address]    Report Status 01/08/2018 FINAL  Final  Blood Culture (routine x 2)     Status: None   Collection Time: 01/03/18 11:21 AM  Result Value Ref Range Status   Specimen Description   Final    BLOOD RIGHT ANTECUBITAL Blood Culture adequate volume BOTTLES DRAWN AEROBIC AND ANAEROBIC Performed at [facility] [address] [place], [place] [address]    Special Requests   Final    NONE Performed at [facility], [place] [address], [place], [place] [address]    Culture   Final    NO GROWTH 5 DAYS Performed at [facility], [place] [address] [place], [place] [address]    Report Status 01/08/2018 FINAL  Final  MRSA PCR Screening     Status: None   Collection Time: 01/03/18  6:37 PM  Result Value Ref Range Status   MRSA by PCR NEGATIVE NEGATIVE Final    Comment:        The GeneXpert MRSA Assay (FDA approved for NASAL specimens only), is one component of a comprehensive MRSA colonization surveillance program. It is not intended to diagnose MRSA infection nor to guide or monitor treatment for MRSA infections. Performed at [facility], [place] [address] [place], [place] [address]          Radiology Studies: No results found.      Scheduled Meds: . amLODipine  10 mg Per Tube Daily  . aspirin  81 mg Per Tube Daily  . chlorhexidine  15 mL Mouth Rinse BID  . cloNIDine  0.2 mg Per Tube BID  . feeding supplement (PRO-STAT SUGAR FREE 64)  30 mL Per Tube Daily  . heparin injection (subcutaneous)  5,000 Units Subcutaneous Q8H  . mouth rinse  15 mL Mouth Rinse q12n4p  . pantoprazole sodium  40 mg Per Tube Q24H  . potassium chloride  40 mEq Per Tube TID  . Tbo-filgastrim (GRANIX) SQ  480 mcg Subcutaneous Daily  . ticagrelor  90 mg Oral BID   Or  . ticagrelor  90 mg Per Tube BID   Continuous Infusions: . sodium chloride 75 mL/hr at 01/10/18 0406  . feeding supplement (OSMOLITE 1.2 CAL) 1,000 mL (01/10/18 1046)     LOS: 7 days    Time spent: 85min    Domenic Polite, MD [facility] Page via [email or web address], password [email or web address] After 7PM please contact night-coverage  01/10/2018, 12:10 PM

## 2018-01-10 NOTE — Progress Notes (Signed)
STROKE TEAM PROGRESS NOTE   SUBJECTIVE (INTERVAL HISTORY) Patient remains neurologically stable. He did pass swallow eval yesterday and dysphagia 2 diet has been recommended. His white count is elevated today and is 1000 and neutrophils also up.nno new complaints  OBJECTIVE Vitals:   01/10/18 0341 01/10/18 0748 01/10/18 1033 01/10/18 1156  BP: (!) 155/106 (!) 150/90 (!) 168/123 (!) 134/114  Pulse: (!) 113 (!) 114  (!) 110  Resp:  18  20  Temp: 99.9 F (37.7 C) 99.1 F (37.3 C)  98.1 F (36.7 C)  TempSrc: Axillary Axillary  Axillary  SpO2: 98% 100%  97%  Weight:      Height:        CBC:  Recent Labs  Lab 01/09/18 0308 01/10/18 0916  WBC 0.8* 2.0*  NEUTROABS 0.2* 1.0*  HGB 7.7* 7.8*  HCT 21.9* 23.3*  MCV 90.5 93.6  PLT 75* 106*    Basic Metabolic Panel:  Recent Labs  Lab 01/07/18 0618  01/08/18 0429  01/09/18 0308 01/10/18 0916  NA 133*   < > 133*   < > 133* 138  K 2.4*   < > 2.5*   < > 2.8* 4.8  CL 97*   < > 97*   < > 101 101  CO2 25   < > 24   < > 23 22  GLUCOSE 100*   < > 117*   < > 141* 125*  BUN 15   < > 15   < > 15 14  CREATININE 0.99   < > 0.96   < > 0.85 0.86  CALCIUM 8.6*   < > 8.8*   < > 8.8* 9.7  MG 1.6*  --  1.9  --  1.8  --   PHOS 2.3*  --  2.0*  --   --   --    < > = values in this interval not displayed.    Lipid Panel:     Component Value Date/Time   CHOL 204 (H) 01/05/2018 0500   TRIG 285 (H) 01/07/2018 0618   HDL NOT REPORTED DUE TO HIGH TRIGLYCERIDES 01/05/2018 0500   CHOLHDL NOT REPORTED DUE TO HIGH TRIGLYCERIDES 01/05/2018 0500   VLDL UNABLE TO CALCULATE IF TRIGLYCERIDE OVER 400 mg/dL 01/05/2018 0500   LDLCALC UNABLE TO CALCULATE IF TRIGLYCERIDE OVER 400 mg/dL 01/05/2018 0500   HgbA1c:  Lab Results  Component Value Date   HGBA1C 6.4 (H) 01/04/2018   Urine Drug Screen:     Component Value Date/Time   LABOPIA NONE DETECTED 01/04/2018 0833   COCAINSCRNUR NONE DETECTED 01/04/2018 0833   LABBENZ NONE DETECTED 01/04/2018 0833   AMPHETMU NONE DETECTED 01/04/2018 0833   THCU POSITIVE (A) 01/04/2018 0833   LABBARB NONE DETECTED 01/04/2018 0833    Alcohol Level No results found for: ETH  IMAGING Ct Head Wo Contrast 01/03/2018 Patchy low-density in the bilateral cerebellum and occipital lobes. This pattern suggests posterior circulation acute infarcts-consider CTA. Given young age and recent chemotherapy underlying PRES is a consideration-although no symmetric cerebral edema. Metastatic disease with edema is possible but less likely given the distribution.   Ct Angio Head W Or Wo Contrast Ct Angio Neck W And/or Wo Contrast 01/03/2018 1. 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  2. No embolic source seen in the neck, although limited by significant motion artifact.   Mr Brain Wo Contrast 01/03/2018 1. Acute numerous supra- and infratentorial posterior circulation nonhemorrhagic infarcts.  2. MRI corroboration of known acute  basilar artery thrombosis.   Transthoracic Echocardiogram - canceled given TEE results  Bilateral Lower Extremity Venous Dopplers There is no DVT or SVT noted in the bilateral lower extremities.   TEE   Left Ventrical:  65-70%, at least moderate LVH Mitral Valve: normal, no vegetation Aortic Valve: normal, no vegetation Tricuspid Valve: mild TR, no vegetation Pulmonic Valve: trivial PR Left Atrium/ Left atrial appendage: no thrombus Atrial septum: No ASD or PFO Aorta: mild non-mobile atheroma                                                                                                                                                                                                         PHYSICAL EXAM  young African-American gentleman  . Marland Kitchen Afebrile. Head is nontraumatic. Neck is supple without bruit.    Cardiac exam no murmur or gallop. Lungs are clear to auscultation. Distal pulses are well felt. Neurological Exam :  Awake alert  and is not  very cooperative. Keep his eyes closed. Extraocular moments are full range with saccadic dysmetria bilaterally. Blinks to threat bilaterally. Left lower facial weakness. Tongue midline. Weak cough and gag. Left hemiplegia but is able to move fingers only. Trace withdrawal in the left lower extremity. Normal strength on the right side. Sensation appears diminished on the left side. Reflexes are depressed on the left normal on the right. Left plantar upgoing right downgoing. Gait not tested.   ASSESSMENT/PLAN Mr. Kristin Lamagna is a 37 y.o. male with history of renal insufficiency and testicular cancer with lymph node metastasis who presented to the [facility] ED this morning with AMS, rigors and dystonic posturing. He did not receive IV t-PA due to unknown time of onset.  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  MRI head - Acute numerous supra - and infratentorial posterior circulation nonhemorrhagic infarcts.   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   TEE - EF  65% to 70%. No PFO. No SOE  LE Dopplers negative  LDL - unable to calculate due to TG 1,966 on Clevipex and propofol. Repeat TG 1449  HgbA1c - 6.4  No need for EEG  UDS - THC positive  VTE prophylaxis - heparin subq, d/c SCDs  Diet - NPO - hold given planned extubation  No antithrombotic prior  to admission, now on aspirin 81 mg daily and Brilinta 90 mg BID given stent   Therapy recommendations:  CLR  Disposition:  CLR  Right testicular tumor  Followed by Dr. Ron Agee w/ oncology  With lymph node metastasis  S/p orchidectomy and currently on chemo with cisplatin and etoposide with 1 more dose  due in Dec  Dr. Jana Hakim does not feel likely to be hypercoagulable state from testicular tumor or from cistplatin / etoposide - he recommended continued embolic workup  Acute respiratory failure  Intubated on sedation for airway protection  CCM on board  plan extubation today  Hypertension  Stable . On cleviprex . Increase SBP goal 110-180 . on multiple BP meds including amlodipine, chlorthalidone, clonidine  . Wean off cleviprex as able . Long-term BP goal normotensive  Hyperlipidemia  Lipid lowering medication PTA:  none  LDL unable to calculate due to high TG, goal < 70  TG 1966 likely contamination from propofol and cleviprex  Repeat lipid 1449   Current lipid lowering medication: none  Add statin once able to swallow  Tobacco abuse  Current cigar smoker  Smoking cessation counseling will be provided  Leukocytosis with low grade fever  Felt to be due to chemo  WBC 15.1->3.6 -> 3.8 -> 0.9   Tmax 98.9  CXR no active disease  Received 1-2 doses of vanco, rocephin and acyclovir - all stopped  Other Stroke Risk Factors  THC user - UDS positive for THC  Other Active Problems  Hemoglobin 12.0->19.1->10.9->9.2  Hyponatremia Na - 132->136->134  Hypokalemia 2.8 - replaced->3.5  Hypocalcemia->8.1  Pancytopenia-likely chemotherapy effect-- started Neupogen.  Hospital day # 7   I discussed with Dr. Broadus John.Continue panda tube and encourage oral intake Mobilize out of bed. Therapy and rehabilitation consults.Discontinue neutropenic precautions.   Stroke service will sign off.. Call for questions. Discussed with patient,   and Dr. Broadus John.      Antony Contras, MD Medical Director [facility] Pager: [phone] 01/10/2018 1:26 PM     To contact Stroke Continuity provider, please refer to [email or web address] After hours, contact General Neurology

## 2018-01-11 LAB — GLUCOSE, CAPILLARY
GLUCOSE-CAPILLARY: 148 mg/dL — AB (ref 70–99)
Glucose-Capillary: 117 mg/dL — ABNORMAL HIGH (ref 70–99)
Glucose-Capillary: 107 mg/dL — ABNORMAL HIGH (ref 70–99)
Glucose-Capillary: 171 mg/dL — ABNORMAL HIGH (ref 70–99)
Glucose-Capillary: 156 mg/dL — ABNORMAL HIGH (ref 70–99)

## 2018-01-11 LAB — CBC WITH DIFFERENTIAL/PLATELET
Abs Immature Granulocytes: 0.23 10*3/uL — ABNORMAL HIGH (ref 0.00–0.07)
Basophils Absolute: 0.1 10*3/uL (ref 0.0–0.1)
Basophils Relative: 1 %
EOS ABS: 0 10*3/uL (ref 0.0–0.5)
Eosinophils Relative: 0 %
HEMATOCRIT: 23 % — AB (ref 39.0–52.0)
HEMOGLOBIN: 7.6 g/dL — AB (ref 13.0–17.0)
Immature Granulocytes: 4 %
LYMPHS ABS: 1.3 10*3/uL (ref 0.7–4.0)
Lymphocytes Relative: 25 %
MCH: 31.1 pg (ref 26.0–34.0)
MCHC: 33 g/dL (ref 30.0–36.0)
MCV: 94.3 fL (ref 80.0–100.0)
Monocytes Absolute: 0.4 10*3/uL (ref 0.1–1.0)
Monocytes Relative: 8 %
Neutro Abs: 3.2 10*3/uL (ref 1.7–7.7)
Neutrophils Relative %: 62 %
Platelets: 158 10*3/uL (ref 150–400)
RBC: 2.44 MIL/uL — ABNORMAL LOW (ref 4.22–5.81)
RDW: 16 % — ABNORMAL HIGH (ref 11.5–15.5)
WBC: 5.3 10*3/uL (ref 4.0–10.5)
nRBC: 0 % (ref 0.0–0.2)

## 2018-01-11 LAB — BASIC METABOLIC PANEL
Anion gap: 15 (ref 5–15)
BUN: 15 mg/dL (ref 6–20)
CO2: 22 mmol/L (ref 22–32)
Calcium: 9.5 mg/dL (ref 8.9–10.3)
Chloride: 99 mmol/L (ref 98–111)
Creatinine, Ser: 0.95 mg/dL (ref 0.61–1.24)
GFR calc Af Amer: >60 mL/min (ref >60)
GFR calc non Af Amer: >60 mL/min (ref >60)
Glucose, Bld: 126 mg/dL — ABNORMAL HIGH (ref 70–99)
Potassium: 3.9 mmol/L (ref 3.5–5.1)
Sodium: 136 mmol/L (ref 135–145)

## 2018-01-11 MED ORDER — METOPROLOL TARTRATE 25 MG PO TABS: 25.0000 mg | ORAL_TABLET | Freq: Two times a day (BID) | ORAL | Status: DC

## 2018-01-11 MED ORDER — METOPROLOL TARTRATE 12.5 MG HALF TABLET
12.5000 mg | ORAL_TABLET | Freq: Two times a day (BID) | ORAL | Status: DC
  Administered 2018-01-11 (×2): 12.5 mg
  Filled 2018-01-11 (×2): qty 1

## 2018-01-11 MED ORDER — CLONIDINE HCL 0.1 MG PO TABS
0.1000 mg | ORAL_TABLET | Freq: Two times a day (BID) | ORAL | Status: DC
  Administered 2018-01-11 – 2018-01-12 (×2): 0.1 mg
  Filled 2018-01-11 (×2): qty 1

## 2018-01-11 NOTE — Progress Notes (Signed)
CSW received a call from pt's RN stating family wants information/resources on seeking Medicaid for the pt.  CSW will bring family resources.  CSW will continue to follow for D/C needs.  Alphonse Guild. Samule Life, LCSW, LCAS, CSI Clinical Social Worker Ph: [phone]

## 2018-01-11 NOTE — Progress Notes (Signed)
STROKE TEAM PROGRESS NOTE   SUBJECTIVE (INTERVAL HISTORY) Patient remains neurologically stable. He did pass swallow eval  and is on dysphagia 2 diet but has not been eating well. His white count is elevated today 2 normal range  and neutrophils are also in the normal range. He has no new complaints.his sister and Dr. Broadus John are at the bedside  OBJECTIVE Vitals:   01/10/18 2312 01/11/18 0400 01/11/18 0755 01/11/18 1108  BP: (!) 169/96 (!) 174/83 131/85 123/88  Pulse: (!) 106 (!) 103 91 98  Resp: 20 (!) 24 (!) 21 20  Temp: 98 F (36.7 C) 97.6 F (36.4 C) (!) 97.5 F (36.4 C) 98 F (36.7 C)  TempSrc: Oral Oral Axillary Axillary  SpO2: 96% 96% 99% 97%  Weight:  62.5 kg    Height:        CBC:  Recent Labs  Lab 01/10/18 0916 01/11/18 0600  WBC 2.0* 5.3  NEUTROABS 1.0* 3.2  HGB 7.8* 7.6*  HCT 23.3* 23.0*  MCV 93.6 94.3  PLT 106* 696    Basic Metabolic Panel:  Recent Labs  Lab 01/07/18 0618  01/08/18 0429  01/09/18 0308 01/10/18 0916 01/11/18 0600  NA 133*   < > 133*   < > 133* 138 136  K 2.4*   < > 2.5*   < > 2.8* 4.8 3.9  CL 97*   < > 97*   < > 101 101 99  CO2 25   < > 24   < > 23 22 22   GLUCOSE 100*   < > 117*   < > 141* 125* 126*  BUN 15   < > 15   < > 15 14 15   CREATININE 0.99   < > 0.96   < > 0.85 0.86 0.95  CALCIUM 8.6*   < > 8.8*   < > 8.8* 9.7 9.5  MG 1.6*  --  1.9  --  1.8  --   --   PHOS 2.3*  --  2.0*  --   --   --   --    < > = values in this interval not displayed.    Lipid Panel:     Component Value Date/Time   CHOL 204 (H) 01/05/2018 0500   TRIG 285 (H) 01/07/2018 0618   HDL NOT REPORTED DUE TO HIGH TRIGLYCERIDES 01/05/2018 0500   CHOLHDL NOT REPORTED DUE TO HIGH TRIGLYCERIDES 01/05/2018 0500   VLDL UNABLE TO CALCULATE IF TRIGLYCERIDE OVER 400 mg/dL 01/05/2018 0500   LDLCALC UNABLE TO CALCULATE IF TRIGLYCERIDE OVER 400 mg/dL 01/05/2018 0500   HgbA1c:  Lab Results  Component Value Date   HGBA1C 6.4 (H) 01/04/2018   Urine Drug Screen:      Component Value Date/Time   LABOPIA NONE DETECTED 01/04/2018 0833   COCAINSCRNUR NONE DETECTED 01/04/2018 0833   LABBENZ NONE DETECTED 01/04/2018 0833   AMPHETMU NONE DETECTED 01/04/2018 0833   THCU POSITIVE (A) 01/04/2018 0833   LABBARB NONE DETECTED 01/04/2018 0833    Alcohol Level No results found for: ETH  IMAGING Ct Head Wo Contrast 01/03/2018 Patchy low-density in the bilateral cerebellum and occipital lobes. This pattern suggests posterior circulation acute infarcts-consider CTA. Given young age and recent chemotherapy underlying PRES is a consideration-although no symmetric cerebral edema. Metastatic disease with edema is possible but less likely given the distribution.   Ct Angio Head W Or Wo Contrast Ct Angio Neck W And/or Wo Contrast 01/03/2018 1. 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  2. No embolic source seen in the neck, although limited by significant motion artifact.   Mr Brain Wo Contrast 01/03/2018 1. Acute numerous supra- and infratentorial posterior circulation nonhemorrhagic infarcts.  2. 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   Transthoracic Echocardiogram - canceled given TEE results  Bilateral Lower Extremity Venous Dopplers There is no DVT or SVT noted in the bilateral lower extremities.   TEE   Left Ventrical:  65-70%, at least moderate LVH Mitral Valve: normal, no vegetation Aortic Valve: normal, no vegetation Tricuspid Valve: mild TR, no vegetation Pulmonic Valve: trivial PR Left Atrium/ Left atrial appendage: no thrombus Atrial septum: No ASD or PFO Aorta: mild non-mobile atheroma                                                                                                                                                                                                         PHYSICAL EXAM  young African-American gentleman  . Marland Kitchen Afebrile. Head is nontraumatic. Neck is supple  without bruit.    Cardiac exam no murmur or gallop. Lungs are clear to auscultation. Distal pulses are well felt. Neurological Exam :  Sleepy but arouses easily and is not very cooperative. Keep his eyes closed. Extraocular moments are full range with saccadic dysmetria bilaterally. Blinks to threat bilaterally. Left lower facial weakness. Tongue midline. Weak cough and gag. Left hemiplegia but is able to move fingers only. Trace withdrawal in the left lower extremity. Normal strength on the right side. Sensation appears diminished on the left side. Reflexes are depressed on the left normal on the right. Left plantar upgoing right downgoing. Gait not tested.   ASSESSMENT/PLAN [name] is a 37 y.o. male with history of renal insufficiency and testicular cancer with lymph node metastasis who presented to [facility] ED this morning with AMS, rigors and dystonic posturing. He did not receive IV t-PA due to unknown time of onset.  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  MRI head - Acute numerous supra - and infratentorial posterior circulation nonhemorrhagic infarcts.   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   TEE - EF  65% to 70%. No PFO. No SOE  LE Dopplers negative  LDL - unable to calculate due to TG  1,966 on Clevipex and propofol. Repeat TG 1449  HgbA1c - 6.4  No need for EEG  UDS - THC positive  VTE prophylaxis - heparin subq, d/c SCDs  Diet - NPO - hold given planned extubation  No antithrombotic prior to admission, now on aspirin 81 mg daily and Brilinta 90 mg BID given stent   Therapy recommendations:  CLR  Disposition:  CLR  Right  testicular tumor  Followed by Dr. Ron Agee w/ oncology  With lymph node metastasis  S/p orchidectomy and currently on chemo with cisplatin and etoposide with 1 more dose due in Dec  Dr. Jana Hakim does not feel likely to be hypercoagulable state from testicular tumor or from cistplatin / etoposide - he recommended continued embolic workup  Acute respiratory failure  Intubated on sedation for airway protection  CCM on board  plan extubation today  Hypertension  Stable . On cleviprex . Increase SBP goal 110-180 . on multiple BP meds including amlodipine, chlorthalidone, clonidine  . Wean off cleviprex as able . Long-term BP goal normotensive  Hyperlipidemia  Lipid lowering medication PTA:  none  LDL unable to calculate due to high TG, goal < 70  TG 1966 likely contamination from propofol and cleviprex  Repeat lipid 1449   Current lipid lowering medication: none  Add statin once able to swallow  Tobacco abuse  Current cigar smoker  Smoking cessation counseling will be provided  Leukocytosis with low grade fever  Felt to be due to chemo  WBC 15.1->3.6 -> 3.8 -> 0.9   Tmax 98.9  CXR no active disease  Received 1-2 doses of vanco, rocephin and acyclovir - all stopped  Other Stroke Risk Factors  THC user - UDS positive for THC  Other Active Problems  Hemoglobin 12.0->19.1->10.9->9.2  Hyponatremia Na - 132->136->134  Hypokalemia 2.8 - replaced->3.5  Hypocalcemia->8.1  Pancytopenia-likely chemotherapy effect-- started Neupogen. And resolved now  Hospital day # 8   I discussed with Dr. Broadus John.Continue panda tube and encourage oral intake Mobilize out of bed. Therapy and rehabilitation consults.Discontinue neutropenic precautions.   Stroke service will sign off.. Call for questions. Discussed with patient, sister  and Dr. Broadus John.      Antony Contras, MD Medical Director [facility] Pager: [phone] 01/11/2018 11:53 AM     To  contact Stroke Continuity provider, please refer to [email or web address] After hours, contact General Neurology

## 2018-01-11 NOTE — Progress Notes (Signed)
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 He underwent revascularization with Neuro IR, and remained on a vent after the procedure.  -now with aphasia, dysphagia, left hemiplegia -Hospitalization also complicated by neutropenia from chemotherapy -Cortrak placed, started tube feeds, then developed profuse diarrhea from tube feeding  Significant Events: 11/23: [facility]ed to [facility] ED 11/23 IR cerebral arteriogram with revascularization - intubated  11/27 extubated 11/28 [facility] asked to assume med consult by PCCM  11/29: Transferred from neurology to [place]:   Acute CVA  -Bilateral cerebellar, brainstem and occipital infarcts due to basilar artery occlusion  -Neuro interventional radiology consulted, underwent revascularization with TICI 2b followed by rescue stent at the site of severe focal stenosis in mid basilar artery -Stable on aspirin and Brilinta -Unfortunately remains profoundly aphasic, hemiplegic, dysphagia as well -TEE noted EF of 65%, no PFO, Dopplers negative -LDL could not be calculated due to elevated triglycerides, hemoglobin A1c was 6.4 -UDS was positive for [facility] -SLP reevaluated 11/29, started dysphagia 2 diet, cut down tube feeds due to diarrhea -CIR evaluating needs aggressive rehabilitation,   Right testicular tumor with nodal metastasis -Dr. Jana Hakim oncology following -Status post orchiectomy, currently on chemo with cisplatin and etoposide -Oncology does not feel he is hypercoagulable from testicular tumor or chemo -Stop  Granix  Acute respiratory failure -Intubated for airway protection -Extubated 11/27 -Was followed by PCCM, now resolved  Neutropenia -Due to chemotherapy -Treated with daily Granix until [place] >1000 -WBC is improving, ANC well above, will discontinue Granix tomorrow  Hypertension -Now on amlodipine, clonidine, chlorthalidone -Add beta-blocker as well, cut down clonidine dose  Tobacco abuse  Diarrhea -Likely due to tube feeds most likely as he was not having diarrhea before tube feeds -Imodium as needed -cut down TFs  Hypokalemia -Due to tube feeds, replaced  DVT prophylaxis: SQ heparin  Code Status: FULL CODE Family Communication:  mother at bedside yesterday disposition needs CIR for aggressive rehab  Consultants:   Neuro   Procedures: 11/23 revascularization with TICI to be followed by rescue stent at the site of severe focal stenosis in mid basilar artery  Antimicrobials:    Subjective: -Continues to have diarrhea from tube feeds, no fevers or chills, -Started on a dysphagia 2 diet yesterday  Objective: Vitals:   01/10/18 2312 01/11/18 0400 01/11/18 0755 01/11/18 1108  BP: (!) 169/96 (!) 174/83 131/85 123/88  Pulse: (!) 106 (!) 103 91 98  Resp: 20 (!) 24 (!) 21 20  Temp: 98 F (36.7 C) 97.6 F (36.4 C) (!) 97.5 F (36.4 C) 98 F (36.7 C)  TempSrc: Oral Oral Axillary Axillary  SpO2: 96% 96% 99% 97%  Weight:  62.5 kg    Height:        Intake/Output Summary (Last 24 hours) at 01/11/2018 1150 Last data filed at 01/11/2018 1104 Gross per 24 hour  Intake 2993.15 ml  Output 3000 ml  Net -6.85 ml   Filed Weights   01/09/18 0500 01/09/18 2050 01/11/18 0400  Weight: 61.6 kg 62.2 kg 62.5 kg    Examination:  Gen: Awake alert, aphasic, follows commands, no distress HEENT:  PERRLA, Neck supple, no JVD Lungs: Good air movement bilaterally, CTAB CVS: S1-S2/regular rate rhythm, mild tachycardia Abd: soft, Non tender, non distended, BS  present Extremities: No edema Skin: no new rashes Central nervous system: Alert awake, follows commands, mostly keeps his eyes closed but opens on command extraocular movements are intact left hemiplegia, right arm and right lower extremities 4+/5  Data Reviewed:   CBC: Recent Labs  Lab 01/07/18 0618 01/08/18 0429 01/09/18 0308 01/10/18 0916 01/11/18 0600  WBC 0.5* 0.7* 0.8* 2.0* 5.3  NEUTROABS 0.1* 0.2* 0.2* 1.0* 3.2  HGB 8.0* 8.1* 7.7* 7.8* 7.6*  HCT 23.5* 23.3* 21.9* 23.3* 23.0*  MCV 90.4 90.3 90.5 93.6 94.3  PLT PLATELET CLUMPS NOTED ON SMEAR, UNABLE TO ESTIMATE 86* 75* 106* 342   Basic Metabolic Panel: Recent Labs  Lab 01/05/18 0500 01/06/18 0535 01/06/18 0851 01/07/18 0618  01/08/18 0429 01/08/18 1543 01/09/18 0308 01/10/18 0916 01/11/18 0600  NA 136 134*  --  133*   < > 133* 136 133* 138 136  K 2.8* 3.5  --  2.4*   < > 2.5* 2.9* 2.8* 4.8 3.9  CL 108 103  --  97*   < > 97* 99 101 101 99  CO2 15* 21*  --  25   < > 24 27 23 22 22   GLUCOSE 102* 114*  --  100*   < > 117* 102* 141* 125* 126*  BUN 9 16  --  15   < > 15 15 15 14 15   CREATININE 0.85 1.15  --  0.99   < > 0.96 0.85 0.85 0.86 0.95  CALCIUM 5.7* 8.1*  --  8.6*   < > 8.8* 8.9 8.8* 9.7 9.5  MG  --   --  1.6* 1.6*  --  1.9  --  1.8  --   --   PHOS 4.1 3.6  --  2.3*  --  2.0*  --   --   --   --    < > = values in this interval not displayed.   GFR: Estimated Creatinine Clearance: 95 mL/min (by C-G formula based on SCr of 0.95 mg/dL). Liver Function Tests: Recent Labs  Lab 01/05/18 0500 01/06/18 0535 01/09/18 0308  AST  --   --  71*  ALT  --   --  64*  ALKPHOS  --   --  83  BILITOT  --   --  0.7  PROT  --   --  6.5  ALBUMIN 2.5* 2.5* 2.9*   No results for input(s): LIPASE, AMYLASE in the last 168 hours. No results for input(s): AMMONIA in the last 168 hours. Coagulation Profile: No results for input(s): INR, PROTIME in the last 168 hours. Cardiac Enzymes: No results for input(s): CKTOTAL, CKMB,  CKMBINDEX, TROPONINI in the last 168 hours. BNP (last 3 results) No results for input(s): PROBNP in the last 8760 hours. HbA1C: No results for input(s): HGBA1C in the last 72 hours. CBG: Recent Labs  Lab 01/10/18 1935 01/10/18 2304 01/11/18 0330 01/11/18 0754 01/11/18 1139  GLUCAP 132* 169* 117* 107* 148*   Lipid Profile: No results for input(s): CHOL, HDL, LDLCALC, TRIG, CHOLHDL, LDLDIRECT in the last 72 hours. Thyroid Function Tests: No results for input(s): TSH, T4TOTAL, FREET4, T3FREE, THYROIDAB in the last 72 hours. Anemia Panel: No results for input(s): VITAMINB12, FOLATE, FERRITIN, TIBC, IRON, RETICCTPCT in the last 72 hours. Urine analysis:    Component Value Date/Time   COLORURINE YELLOW 05/27/2017 1159  APPEARANCEUR CLEAR 05/27/2017 1159   LABSPEC 1.012 05/27/2017 1159   PHURINE 6.0 05/27/2017 [address] 05/27/2017 1159   HGBUR NEGATIVE 05/27/2017 1159   [place] 05/27/2017 1159   [place] 05/27/2017 1159   PROTEINUR NEGATIVE 05/27/2017 1159   UROBILINOGEN 0.2 09/13/2013 1330   NITRITE NEGATIVE 05/27/2017 1159   LEUKOCYTESUR NEGATIVE 05/27/2017 1159   Sepsis Labs: @LABRCNTIP (procalcitonin:4,lacticidven:4)  ) Recent Results (from the past 240 hour(s))  Blood Culture (routine x 2)     Status: None   Collection Time: 01/03/18 11:20 AM  Result Value Ref Range Status   Specimen Description   Final    LEFT ANTECUBITAL Blood Culture adequate volume BOTTLES DRAWN AEROBIC ONLY Performed at [facility], [place] [address] [place], [place] [address]    Special Requests   Final    NONE Performed at [facility], [place] [address] [place], [place] [address]    Culture   Final    NO GROWTH 5 DAYS Performed at [facility], [place] [address], [place], [place] [address]    Report Status 01/08/2018 FINAL  Final  Blood Culture (routine x 2)     Status: None   Collection Time: 01/03/18 11:21 AM   Result Value Ref Range Status   Specimen Description   Final    BLOOD RIGHT ANTECUBITAL Blood Culture adequate volume BOTTLES DRAWN AEROBIC AND ANAEROBIC Performed at [place] [address], [place], [place] [address]    Special Requests   Final    NONE Performed at [facility], [place] [address], [place], [place] [address]    Culture   Final    NO GROWTH 5 DAYS Performed at [facility], [place] [address] [place], [place] [address]    Report Status 01/08/2018 FINAL  Final  MRSA PCR Screening     Status: None   Collection Time: 01/03/18  6:37 PM  Result Value Ref Range Status   MRSA by PCR NEGATIVE NEGATIVE Final    Comment:        The GeneXpert MRSA Assay (FDA approved for NASAL specimens only), is one component of a comprehensive MRSA colonization surveillance program. It is not intended to diagnose MRSA infection nor to guide or monitor treatment for MRSA infections. Performed at [facility], [place] [address], [place], [place] [address]          Radiology Studies: No results found.      Scheduled Meds: . amLODipine  10 mg Per Tube Daily  . aspirin  81 mg Per Tube Daily  . chlorhexidine  15 mL Mouth Rinse BID  . cloNIDine  0.2 mg Per Tube BID  . heparin injection (subcutaneous)  5,000 Units Subcutaneous Q8H  . loperamide HCl  2 mg Per Tube BID  . mouth rinse  15 mL Mouth Rinse q12n4p  . metoprolol tartrate  25 mg Per Tube BID  . pantoprazole sodium  40 mg Per Tube Q24H  . ticagrelor  90 mg Oral BID   Or  . ticagrelor  90 mg Per Tube BID   Continuous Infusions:    LOS: 8 days    Time spent: 66min    Domenic Polite, MD [facility] Page via [email or web address], password [email or web address] After 7PM please contact night-coverage  01/11/2018, 11:50 AM

## 2018-01-12 ENCOUNTER — Inpatient Hospital Stay (HOSPITAL_COMMUNITY): Payer: Medicaid Other

## 2018-01-12 LAB — GLUCOSE, CAPILLARY
Glucose-Capillary: 123 mg/dL — ABNORMAL HIGH (ref 70–99)
Glucose-Capillary: 151 mg/dL — ABNORMAL HIGH (ref 70–99)
Glucose-Capillary: 119 mg/dL — ABNORMAL HIGH (ref 70–99)
Glucose-Capillary: 119 mg/dL — ABNORMAL HIGH (ref 70–99)
Glucose-Capillary: 118 mg/dL — ABNORMAL HIGH (ref 70–99)
Glucose-Capillary: 96 mg/dL (ref 70–99)
Glucose-Capillary: 125 mg/dL — ABNORMAL HIGH (ref 70–99)

## 2018-01-12 LAB — BASIC METABOLIC PANEL
Anion gap: 15 (ref 5–15)
BUN: 10 mg/dL (ref 6–20)
CHLORIDE: 95 mmol/L — AB (ref 98–111)
CO2: 25 mmol/L (ref 22–32)
CREATININE: 0.97 mg/dL (ref 0.61–1.24)
Calcium: 9.6 mg/dL (ref 8.9–10.3)
GFR calc Af Amer: >60 mL/min (ref >60)
GFR calc non Af Amer: >60 mL/min (ref >60)
Glucose, Bld: 120 mg/dL — ABNORMAL HIGH (ref 70–99)
Potassium: 3.4 mmol/L — ABNORMAL LOW (ref 3.5–5.1)
Sodium: 135 mmol/L (ref 135–145)

## 2018-01-12 LAB — URINALYSIS, ROUTINE W REFLEX MICROSCOPIC
Bilirubin Urine: NEGATIVE
Glucose, UA: NEGATIVE mg/dL
Hgb urine dipstick: NEGATIVE
Ketones, ur: NEGATIVE mg/dL
LEUKOCYTES UA: NEGATIVE
Nitrite: NEGATIVE
Protein, ur: NEGATIVE mg/dL
SPECIFIC GRAVITY, URINE: 1.012 (ref 1.005–1.030)
pH: 6 (ref 5.0–8.0)

## 2018-01-12 LAB — CBC
HCT: 22.9 % — ABNORMAL LOW (ref 39.0–52.0)
Hemoglobin: 7.6 g/dL — ABNORMAL LOW (ref 13.0–17.0)
MCH: 30.6 pg (ref 26.0–34.0)
MCHC: 33.2 g/dL (ref 30.0–36.0)
MCV: 92.3 fL (ref 80.0–100.0)
Platelets: 226 10*3/uL (ref 150–400)
RBC: 2.48 MIL/uL — ABNORMAL LOW (ref 4.22–5.81)
RDW: 15.7 % — ABNORMAL HIGH (ref 11.5–15.5)
WBC: 11.6 10*3/uL — ABNORMAL HIGH (ref 4.0–10.5)
nRBC: 0.5 % — ABNORMAL HIGH (ref 0.0–0.2)

## 2018-01-12 MED ORDER — AMLODIPINE BESYLATE 10 MG PO TABS
10.0000 mg | ORAL_TABLET | Freq: Every day | ORAL | Status: DC
  Administered 2018-01-13 – 2018-01-15 (×3): 10 mg via ORAL
  Filled 2018-01-12 (×3): qty 1

## 2018-01-12 MED ORDER — ACETAMINOPHEN 160 MG/5ML PO SOLN
650.0000 mg | ORAL | Status: DC | PRN
  Administered 2018-01-12 – 2018-01-14 (×2): 650 mg via ORAL
  Filled 2018-01-12 (×2): qty 20.3

## 2018-01-12 MED ORDER — ACETAMINOPHEN 650 MG RE SUPP: 650.0000 mg | RECTAL | Status: DC | PRN

## 2018-01-12 MED ORDER — CLONIDINE HCL 0.1 MG PO TABS
0.1000 mg | ORAL_TABLET | Freq: Every day | ORAL | Status: DC
  Administered 2018-01-13 – 2018-01-15 (×3): 0.1 mg via ORAL
  Filled 2018-01-12 (×3): qty 1

## 2018-01-12 MED ORDER — METOPROLOL TARTRATE 25 MG PO TABS
25.0000 mg | ORAL_TABLET | Freq: Two times a day (BID) | ORAL | Status: DC
  Administered 2018-01-12 – 2018-01-15 (×6): 25 mg via ORAL
  Filled 2018-01-12 (×6): qty 1

## 2018-01-12 MED ORDER — METOPROLOL TARTRATE 25 MG PO TABS
25.0000 mg | ORAL_TABLET | Freq: Two times a day (BID) | ORAL | Status: DC
  Administered 2018-01-12: 25 mg
  Filled 2018-01-12: qty 1

## 2018-01-12 MED ORDER — PANTOPRAZOLE SODIUM 40 MG PO PACK
40.0000 mg | PACK | ORAL | Status: DC
  Administered 2018-01-13 – 2018-01-14 (×2): 40 mg via ORAL
  Filled 2018-01-12 (×2): qty 20

## 2018-01-12 MED ORDER — ENSURE ENLIVE PO LIQD
237.0000 mL | Freq: Three times a day (TID) | ORAL | Status: DC
  Administered 2018-01-12 – 2018-01-14 (×3): 237 mL via ORAL

## 2018-01-12 MED ORDER — ASPIRIN 81 MG PO CHEW
81.0000 mg | CHEWABLE_TABLET | Freq: Every day | ORAL | Status: DC
  Administered 2018-01-13 – 2018-01-15 (×3): 81 mg via ORAL
  Filled 2018-01-12 (×3): qty 1

## 2018-01-12 NOTE — Plan of Care (Signed)
  Problem: Clinical Measurements: Goal: Diagnostic test results will improve Outcome: Progressing   Problem: Elimination: Goal: Will not experience complications related to bowel motility Outcome: Progressing   

## 2018-01-12 NOTE — Progress Notes (Signed)
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 He underwent revascularization with Neuro IR, and remained on a vent after the procedure.  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  -Neuro interventional radiology consulted, underwent revascularization with TICI 2b followed by rescue stent at the site of severe focal stenosis in mid basilar artery -Stable on aspirin and Brilinta -Unfortunately remains profoundly aphasic, hemiplegic, dysphagia as well -TEE noted EF of 65%, no PFO, Dopplers negative -LDL could not be calculated due to elevated triglycerides, hemoglobin A1c was 6.4 -UDS was positive for [facility] -SLP reevaluated 11/29, started dysphagia 2 diet, cut down and stopped tube feeds due to diarrhea -CIR evaluating needs aggressive rehabilitation -medically more stable now, discontinue cortrak is if he is able to tolerate breakfast and lunch today  Right testicular tumor with nodal  metastasis -Dr. Jana Hakim oncology following -Status post orchiectomy, currently on chemo with cisplatin and etoposide -Oncology does not feel he is hypercoagulable from testicular tumor or chemo -Stopped Granix  Acute respiratory failure -Intubated for airway protection -Extubated 11/27 -Was followed by PCCM, now resolved  Neutropenia -Due to chemotherapy -Treated with daily Granix  -WBC is improving, ANC significantly up, discontinued Granix   Hypertension -Now on amlodipine, clonidine, chlorthalidone -Continue beta-blocker as well, cut down clonidine dose  Tachycardia -Likely secondary to autonomic dysfunction from brainstem stroke -Also rule out infection, check urinalysis, chest x-Andel, diarrhea has resolved -Added low-dose beta-blocker, continue this -Patient otherwise appears nontoxic and clinically improving  Tobacco abuse  Diarrhea -Likely due to tube feeds most likely as he was not having diarrhea before tube feeds -Imodium as needed Stopped TFs, diarrhea is better -discontinue Flexi-Seal  Hypokalemia -Due to diarrhea from tube feeds, replaced  DVT prophylaxis: SQ heparin  Code Status: FULL CODE Family Communication:  mother at bedside yesterday disposition needs CIR for aggressive rehab  Consultants:   Neuro   Procedures: 11/23 revascularization with TICI to be followed by rescue stent at the site of severe focal stenosis in mid basilar artery  Antimicrobials:    Subjective: -Diarrhea has resolved, starting to mumble/communicate better  Objective: Vitals:   01/11/18 2000 01/11/18 2353 01/12/18 0400 01/12/18 0500  BP: (!) 137/103 (!) 160/112 (!) 146/105   Pulse: (!) 110 (!) 121 (!) 112   Resp: 18 20 (!) 22   Temp: 98 F (36.7 C) 99 F (37.2 C) 98.9 F (37.2 C)   TempSrc: Oral Oral Oral   SpO2: 96% 97% 98%   Weight:  62.8 kg  63.4 kg  Height:        Intake/Output Summary (Last 24 hours) at 01/12/2018 1128 Last data filed  at 01/11/2018  2207 Gross per 24 hour  Intake 180 ml  Output 750 ml  Net -570 ml   Filed Weights   01/11/18 0400 01/11/18 2353 01/12/18 0500  Weight: 62.5 kg 62.8 kg 63.4 kg    Examination:  Gen: Awake, Alert, pleasant, nontoxic, making eye contact today HEENT: PERRLA, Neck supple, no JVD Lungs: Decreased breath sounds at both bases CVS: S1-S2/tachycardic Abd: soft, Non tender, non distended, BS present Extremities: No edema Skin: no new rashes Central nervous system: Alert awake, follows commands, eyes open, starting to communicate a few words, left hemiplegia, right arm and right lower extremities 4+/5  Data Reviewed:   CBC: Recent Labs  Lab 01/07/18 0618 01/08/18 0429 01/09/18 0308 01/10/18 0916 01/11/18 0600 01/12/18 0603  WBC 0.5* 0.7* 0.8* 2.0* 5.3 11.6*  NEUTROABS 0.1* 0.2* 0.2* 1.0* 3.2  --   HGB 8.0* 8.1* 7.7* 7.8* 7.6* 7.6*  HCT 23.5* 23.3* 21.9* 23.3* 23.0* 22.9*  MCV 90.4 90.3 90.5 93.6 94.3 92.3  PLT PLATELET CLUMPS NOTED ON SMEAR, UNABLE TO ESTIMATE 86* 75* 106* 158 347   Basic Metabolic Panel: Recent Labs  Lab 01/06/18 0535 01/06/18 0851 01/07/18 0618  01/08/18 0429 01/08/18 1543 01/09/18 0308 01/10/18 0916 01/11/18 0600 01/12/18 0603  NA 134*  --  133*   < > 133* 136 133* 138 136 135  K 3.5  --  2.4*   < > 2.5* 2.9* 2.8* 4.8 3.9 3.4*  CL 103  --  97*   < > 97* 99 101 101 99 95*  CO2 21*  --  25   < > 24 27 23 22 22 25   GLUCOSE 114*  --  100*   < > 117* 102* 141* 125* 126* 120*  BUN 16  --  15   < > 15 15 15 14 15 10   CREATININE 1.15  --  0.99   < > 0.96 0.85 0.85 0.86 0.95 0.97  CALCIUM 8.1*  --  8.6*   < > 8.8* 8.9 8.8* 9.7 9.5 9.6  MG  --  1.6* 1.6*  --  1.9  --  1.8  --   --   --   PHOS 3.6  --  2.3*  --  2.0*  --   --   --   --   --    < > = values in this interval not displayed.   GFR: Estimated Creatinine Clearance: 94.4 mL/min (by C-G formula based on SCr of 0.97 mg/dL). Liver Function Tests: Recent Labs  Lab 01/06/18 0535 01/09/18 0308   AST  --  71*  ALT  --  64*  ALKPHOS  --  83  BILITOT  --  0.7  PROT  --  6.5  ALBUMIN 2.5* 2.9*   No results for input(s): LIPASE, AMYLASE in the last 168 hours. No results for input(s): AMMONIA in the last 168 hours. Coagulation Profile: No results for input(s): INR, PROTIME in the last 168 hours. Cardiac Enzymes: No results for input(s): CKTOTAL, CKMB, CKMBINDEX, TROPONINI in the last 168 hours. BNP (last 3 results) No results for input(s): PROBNP in the last 8760 hours. HbA1C: No results for input(s): HGBA1C in the last 72 hours. CBG: Recent Labs  Lab 01/11/18 2017 01/11/18 2312 01/12/18 0419 01/12/18 0809 01/12/18 1123  GLUCAP 151* 156* 119* 119* 118*   Lipid Profile: No results for input(s): CHOL, HDL, LDLCALC, TRIG, CHOLHDL, LDLDIRECT in the last 72 hours. Thyroid Function Tests: No results for input(s): TSH,  T4TOTAL, FREET4, T3FREE, THYROIDAB in the last 72 hours. Anemia Panel: No results for input(s): VITAMINB12, FOLATE, FERRITIN, TIBC, IRON, RETICCTPCT in the last 72 hours. Urine analysis:    Component Value Date/Time   COLORURINE YELLOW 01/12/2018 1106   APPEARANCEUR CLEAR 01/12/2018 1106   LABSPEC 1.012 01/12/2018 1106   PHURINE 6.0 01/12/2018 1106   GLUCOSEU NEGATIVE 01/12/2018 1106   HGBUR NEGATIVE 01/12/2018 1106   [place] 01/12/2018 1106   KETONESUR NEGATIVE 01/12/2018 1106   PROTEINUR NEGATIVE 01/12/2018 1106   UROBILINOGEN 0.2 09/13/2013 1330   NITRITE NEGATIVE 01/12/2018 1106   LEUKOCYTESUR NEGATIVE 01/12/2018 1106   Sepsis Labs: @LABRCNTIP (procalcitonin:4,lacticidven:4)  ) Recent Results (from the past 240 hour(s))  Blood Culture (routine x 2)     Status: None   Collection Time: 01/03/18 11:20 AM  Result Value Ref Range Status   Specimen Description   Final    LEFT ANTECUBITAL Blood Culture adequate volume BOTTLES DRAWN AEROBIC ONLY Performed at [facility], [place] [address], [place], [place] [address]     Special Requests   Final    NONE Performed at [facility], [place] [address] [place], [place] [address]    Culture   Final    NO GROWTH 5 DAYS Performed at [facility], [place] [address] [place], [place] [address]    Report Status 01/08/2018 FINAL  Final  Blood Culture (routine x 2)     Status: None   Collection Time: 01/03/18 11:21 AM  Result Value Ref Range Status   Specimen Description   Final    BLOOD RIGHT ANTECUBITAL Blood Culture adequate volume BOTTLES DRAWN AEROBIC AND ANAEROBIC Performed at [place] [address] [place], [place] [address]    Special Requests   Final    NONE Performed at [facility], [place] [address], [place], [place] [address]    Culture   Final    NO GROWTH 5 DAYS Performed at [facility], [place] [address], [place], [place] [address]    Report Status 01/08/2018 FINAL  Final  MRSA PCR Screening     Status: None   Collection Time: 01/03/18  6:37 PM  Result Value Ref Range Status   MRSA by PCR NEGATIVE NEGATIVE Final    Comment:        The GeneXpert MRSA Assay (FDA approved for NASAL specimens only), is one component of a comprehensive MRSA colonization surveillance program. It is not intended to diagnose MRSA infection nor to guide or monitor treatment for MRSA infections. Performed at [facility], [place] [address] [place], [place] [address]          Radiology Studies: No results found.      Scheduled Meds: . amLODipine  10 mg Per Tube Daily  . aspirin  81 mg Per Tube Daily  . chlorhexidine  15 mL Mouth Rinse BID  . cloNIDine  0.1 mg Per Tube BID  . heparin injection (subcutaneous)  5,000 Units Subcutaneous Q8H  . mouth rinse  15 mL Mouth Rinse q12n4p  . metoprolol tartrate  25 mg Per Tube BID  . pantoprazole sodium  40 mg Per Tube Q24H  . ticagrelor  90 mg Oral BID   Or  . ticagrelor  90 mg Per Tube BID   Continuous Infusions:    LOS: 9 days     Time spent: 34min    Domenic Polite, MD [facility] Page via [email or web address], password [facility]  After 7PM please contact night-coverage  01/12/2018, 11:28 AM

## 2018-01-12 NOTE — Progress Notes (Signed)
IP rehab admissions - I saw patient briefly at the bedside.  I called patient's mom and patient's girlfriend.  Mom would like CIR and then home with her and Dad in [place].  Mom is attempting to make inquiries into medicaid application.  Patient has no insurance.  I spoke with Dr. Broadus John.  Patient not ready for CIR today.  Will check back tomorrow for medical readiness.  Call me for questions.  [phone]

## 2018-01-12 NOTE — Progress Notes (Signed)
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 Presents on 11/23 with nausea, altered mental status, dystonic posturing.  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   Vented 11/23.    OT comments  Making steady progress toward goals. Able to progress to EOB with mod A and sustain midline postural control EOB with occasional tactile cues. Increased tone LUE and flexor synergy developing. Participating in UE dressing  And grooming in seated position. Pt tearful at times during session. Excellent CIR candidate. Pt wanting to go home.   Follow Up Recommendations  CIR    Equipment Recommendations  3 in 1 bedside commode;Tub/shower bench    Recommendations for Other Services Rehab consult    Precautions / Restrictions Precautions Precautions: Fall Precaution Comments: corretrack ; neutropenic      Mobility Bed Mobility Overal bed mobility: Needs Assistance       Supine to sit: Mod assist     General bed mobility comments: Once EOB initailly L lateral lean; able to maintain midlien postural control with min A at times  Transfers Overall transfer level: Needs assistance   Transfers: Sit to/from Stand;Stand Pivot Transfers Sit to Stand: Mod assist Stand pivot transfers: Mod assist;+2 physical assistance       General transfer comment: physical assist to advance LLE; L knee buckles with weight shift to L    Balance Overall balance assessment: Needs assistance Sitting-balance support: Feet supported;Single extremity supported   Sitting balance - Comments: occaisional min A; posterior pelvic tilt; kyphotic;  forward head   Standing balance support: Bilateral upper extremity supported Standing balance-Leahy Scale: Poor                             ADL either performed or assessed with clinical judgement   ADL Overall ADL's : Needs assistance/impaired Eating/Feeding: Moderate assistance Eating/Feeding Details (indicate cue type and reason): dysphagia 2 Grooming: Moderate assistance Grooming Details (indicate cue type and reason): difficulty controlling secretions in sitting         Upper Body Dressing : Maximal assistance Upper Body Dressing Details (indicate cue type and reason): hand over hand to initiate donning gown over hemi arm                 Functional mobility during ADLs: Maximal assistance;+2 for physical assistance       Vision   Vision Assessment?: Vision impaired- to be further tested in functional context Additional Comments: dysconjugate gaze; vision impaired; difficulty sustaingin gaze; poor visual attneiton; unable to complete saccades; will further assess   Perception     Praxis      Cognition Arousal/Alertness: Awake/alert Behavior During Therapy: Flat affect(tearful at times) Overall Cognitive Status: Impaired/Different from baseline Area of Impairment: Attention;Safety/judgement;Awareness;Problem solving                   Current Attention Level: Selective   Following Commands: Follows one step commands consistently Safety/Judgement: Decreased awareness of safety;Decreased awareness of deficits Awareness: Emergent Problem Solving: Slow processing;Decreased initiation;Difficulty sequencing;Requires verbal cues;Requires tactile cues General Comments: improved cognition; "level of arousal affecting performance; states he is very tired  Exercises  Facilitation of trunk control during bed mobility and sitting EOB; trunk extension improves with vc to look up out of windows  facilitation of LUE movement patterns after  protraction of scapula to inhibit tone.   Shoulder Instructions       General Comments      Pertinent Vitals/ Pain       Pain Assessment: No/denies pain  Home Living                                          Prior Functioning/Environment              Frequency  Min 2X/week        Progress Toward Goals  OT Goals(current goals can now be found in the care plan section)  Progress towards OT goals: Progressing toward goals;OT to reassess next treatment  Acute Rehab OT Goals Patient Stated Goal: to go home OT Goal Formulation: With patient Time For Goal Achievement: 01/20/18 Potential to Achieve Goals: Good ADL Goals Pt Will Perform Grooming: with set-up;sitting Pt Will Perform Upper Body Bathing: with min assist;sitting Additional ADL Goal #1: Pt will complete bed mobility with min assist +2 and sit EOB with close supervision as precursor for ADLs. Additional ADL Goal #2: Pt will follow 1 step commands with 75% accuracy to increase participation in ADLs.  Plan Discharge plan remains appropriate    Co-evaluation    PT/OT/SLP Co-Evaluation/Treatment: Yes Reason for Co-Treatment: Complexity of the patient's impairments (multi-system involvement);For patient/therapist safety;To address functional/ADL transfers   OT goals addressed during session: ADL's and self-care;Strengthening/ROM      AM-PAC OT "6 Clicks" Daily Activity     Outcome Measure   Help from another person eating meals?: A Little Help from another person taking care of personal grooming?: A Little Help from another person toileting, which includes using toliet, bedpan, or urinal?: A Lot Help from another person bathing (including washing, rinsing, drying)?: A Lot Help from another person to put on and taking off regular upper body clothing?: A Lot Help from another person to put on and taking off regular lower body clothing?: A Lot 6 Click Score: 14    End of Session Equipment  Utilized During Treatment: Gait belt  OT Visit Diagnosis: Other abnormalities of gait and mobility (R26.89);Hemiplegia and hemiparesis Hemiplegia - Right/Left: Left Hemiplegia - dominant/non-dominant: Non-Dominant Hemiplegia - caused by: Cerebral infarction   Activity Tolerance Patient tolerated treatment well   Patient Left in chair;with call bell/phone within reach;with chair alarm set   Nurse Communication Mobility status        Time: 1638-4536 OT Time Calculation (min): 32 min  Charges: OT General Charges $OT Visit: 1 Visit OT Treatments $Self Care/Home Management : 8-22 mins  Maurie Boettcher, OT/L   Acute OT Clinical Specialist [place] Pager [phone] Office [phone]    [facility] 01/12/2018, 3:19 PM

## 2018-01-12 NOTE — Progress Notes (Signed)
Nutrition Follow-up  DOCUMENTATION CODES:   Not applicable  INTERVENTION:  Provide Ensure Enlive po TID (thickened to nectar thick consistency), each supplement provides 350 kcal and 20 grams of protein.  Encourage adequate PO intake.   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  Now s/p 1 of 2 planned cycles of cisplatin / etoposide (12/19/17 through 01/01/18 at [facility]). 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   11/26 extubated  11/27 Cortrak, tip post pyloric per Cortrak team  Diet has been advanced to a dysphagia 2 diet with nectar thick liquids. Tube feeding and Cortrak NGT has been discontinued. Meal completion 5% at dinner last night. Pt unavailable during time of visit due to procedure. RD to order Ensure to aid in caloric and protein needs. Labs and medications reviewed.   Diet Order:   Diet Order            DIET DYS 2 Room service appropriate? Yes; Fluid consistency: Nectar Thick  Diet effective now              EDUCATION NEEDS:   No education needs have been identified at this time  Skin:  Skin Assessment: Reviewed RN Assessment  Last BM:  12/2  Height:   Ht Readings from Last 1 Encounters:  01/03/18 5\' 11"  (1.803 m)    Weight:   Wt Readings from Last 1 Encounters:  01/12/18 63.4 kg    Ideal Body Weight:  78.1 kg  BMI:  Body mass index is 19.49 kg/m.  Estimated Nutritional Needs:   Kcal:   2100-2300  Protein:  100-125 grams  Fluid:  >2.1 L/day    Corrin Parker, MS, RD, LDN Pager # [phone] After hours/ weekend pager # [phone]

## 2018-01-12 NOTE — Progress Notes (Signed)
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 Presents on 11/23 with nausea, altered mental status, dystonic posturing.  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   Vented 11/23.     PT Comments    Pt in bed upon arrival. Pt willing to participate in therapy, but reported he felt tired once sitting EOB. Pt participated in bed mobility, sitting balance and stand pivot transfers. Pt required Mod A to stand from EOB, but Max +2 for stand pivot toward chair for safety. I have discussed the patient's current level of function related to deficits (listed below) with the patient. He acknowledges understanding of this and does not feel the patient would be able to have his care needs met at home. He is interested in post-acute rehab in an inpatient setting. Pt would benefit from continued PT in order to progress toward goals and maximize functional independence. Pt remains appropriate for CIR based on current functional status.    Follow Up Recommendations  CIR     Equipment Recommendations  None recommended by PT    Recommendations for Other Services Rehab consult     Precautions / Restrictions Precautions Precautions: Fall Precaution Comments: corretrack Restrictions Weight Bearing Restrictions: No    Mobility  Bed Mobility Overal bed mobility: Needs Assistance Bed Mobility: Rolling;Sidelying to Sit Rolling: Min assist Sidelying to sit: Mod assist Supine to sit: Mod assist     General bed mobility comments: Once EOB initailly L lateral lean; able to maintain midline postural control with min A at  times  Transfers Overall transfer level: Needs assistance Equipment used: 1 person hand held assist Transfers: Sit to/from [facility] Sit to Stand: Mod assist Stand pivot transfers: Mod assist;+2 physical assistance       General transfer comment: physical assist to advance LLE; L knee buckles with weight shift to L.   Ambulation/Gait                 Stairs             Wheelchair Mobility    Modified Rankin (Stroke Patients Only) Modified Rankin (Stroke Patients Only) Pre-Morbid Rankin Score: No symptoms Modified Rankin: Severe disability     Balance Overall balance assessment: Needs assistance Sitting-balance support: Feet supported;Single extremity supported Sitting balance-Leahy Scale: Poor Sitting balance - Comments: occaisional min A; posterior pelvic tilt; kyphotic; forward head Postural control: Posterior lean Standing balance support: Bilateral upper extremity supported Standing balance-Leahy Scale: Poor Standing balance comment: reliant on external assist                             Cognition Arousal/Alertness: Awake/alert Behavior During Therapy: Flat affect Overall Cognitive Status: Impaired/Different from baseline Area of Impairment: Attention;Safety/judgement;Awareness;Problem solving                   Current Attention Level: Selective   Following Commands: Follows one step commands consistently Safety/Judgement: Decreased awareness of safety;Decreased awareness of deficits Awareness: Emergent Problem Solving: Slow processing;Decreased initiation;Difficulty sequencing;Requires verbal cues;Requires tactile cues General Comments: improved cognition; "level of arousal affecting performance; states he is very tired  Exercises Total Joint Exercises Heel Slides: PROM;AAROM;Left(2 reps PROM; 3 reps AAROM) Other Exercises Other Exercises: Pt in hooklying and instructed in isometric strengthening of Hip  abd/add     General Comments        Pertinent Vitals/Pain Pain Assessment: No/denies pain    Home Living                      Prior Function            PT Goals (current goals can now be found in the care plan section) Acute Rehab PT Goals Patient Stated Goal: to go home PT Goal Formulation: With patient/family Time For Goal Achievement: 01/20/18 Potential to Achieve Goals: Fair Progress towards PT goals: Progressing toward goals    Frequency    Min 4X/week      PT Plan Current plan remains appropriate    Co-evaluation PT/OT/SLP Co-Evaluation/Treatment: Yes Reason for Co-Treatment: Complexity of the patient's impairments (multi-system involvement);For patient/therapist safety;To address functional/ADL transfers PT goals addressed during session: Mobility/safety with mobility OT goals addressed during session: ADL's and self-care;Strengthening/ROM      AM-PAC PT "6 Clicks" Mobility   Outcome Measure  Help needed turning from your back to your side while in a flat bed without using bedrails?: A Lot Help needed moving from lying on your back to sitting on the side of a flat bed without using bedrails?: A Lot Help needed moving to and from a bed to a chair (including a wheelchair)?: A Lot Help needed standing up from a chair using your arms (e.g., wheelchair or bedside chair)?: Total Help needed to walk in hospital room?: Total Help needed climbing 3-5 steps with a railing? 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

## 2018-01-13 ENCOUNTER — Inpatient Hospital Stay (HOSPITAL_COMMUNITY): Payer: Medicaid Other

## 2018-01-13 ENCOUNTER — Encounter (HOSPITAL_COMMUNITY): Payer: Self-pay | Admitting: Radiology

## 2018-01-13 LAB — GLUCOSE, CAPILLARY
GLUCOSE-CAPILLARY: 110 mg/dL — AB (ref 70–99)
Glucose-Capillary: 135 mg/dL — ABNORMAL HIGH (ref 70–99)
Glucose-Capillary: 219 mg/dL — ABNORMAL HIGH (ref 70–99)
Glucose-Capillary: 110 mg/dL — ABNORMAL HIGH (ref 70–99)
Glucose-Capillary: 100 mg/dL — ABNORMAL HIGH (ref 70–99)
Glucose-Capillary: 127 mg/dL — ABNORMAL HIGH (ref 70–99)

## 2018-01-13 LAB — BASIC METABOLIC PANEL
Anion gap: 14 (ref 5–15)
BUN: 14 mg/dL (ref 6–20)
CALCIUM: 9.9 mg/dL (ref 8.9–10.3)
CO2: 28 mmol/L (ref 22–32)
CREATININE: 1.16 mg/dL (ref 0.61–1.24)
Chloride: 94 mmol/L — ABNORMAL LOW (ref 98–111)
GFR calc Af Amer: >60 mL/min (ref >60)
GFR calc non Af Amer: >60 mL/min (ref >60)
Glucose, Bld: 119 mg/dL — ABNORMAL HIGH (ref 70–99)
Potassium: 3.7 mmol/L (ref 3.5–5.1)
Sodium: 136 mmol/L (ref 135–145)

## 2018-01-13 LAB — CBC
HCT: 24.8 % — ABNORMAL LOW (ref 39.0–52.0)
Hemoglobin: 8.1 g/dL — ABNORMAL LOW (ref 13.0–17.0)
MCH: 30.9 pg (ref 26.0–34.0)
MCHC: 32.7 g/dL (ref 30.0–36.0)
MCV: 94.7 fL (ref 80.0–100.0)
Platelets: 288 10*3/uL (ref 150–400)
RBC: 2.62 MIL/uL — ABNORMAL LOW (ref 4.22–5.81)
RDW: 15.9 % — ABNORMAL HIGH (ref 11.5–15.5)
WBC: 13.7 10*3/uL — ABNORMAL HIGH (ref 4.0–10.5)
nRBC: 0.5 % — ABNORMAL HIGH (ref 0.0–0.2)

## 2018-01-13 MED ORDER — IOPAMIDOL (ISOVUE-370) INJECTION 76%
INTRAVENOUS | Status: AC
  Administered 2018-01-13: 100 mL
  Filled 2018-01-13: qty 100

## 2018-01-13 NOTE — Progress Notes (Signed)
  Speech Language Pathology Treatment: Dysphagia  Patient Details Name: Caleb Hubbard MRN: [id number] DOB: 10/08/1980 Today's Date: 01/13/2018 Time: 4917-9150 SLP Time Calculation (min) (ACUTE ONLY): 8 min  Assessment / Plan / Recommendation Clinical Impression  Skilled treatment session focused on dysphagia goals. SLP facilitiated session by providing skilled observation of pt consuming snack of graham crackers in applesauce with nectar thick liquids via cup. Pt continues to remain aphonic with decreased cough strength. However when consuming the above mentioned pt with no overt s/s of aspiration and demonstrates good oral manipulation of boluses. Would recommend trials of advanced textures in next session and explore possibility of RMT.    HPI HPI: [name] is a 37 y.o. male with history of renal insufficiency and testicular cancer with lymph node metastasis who presented with AMS, rigors and dystonic posturing. He did not receive IV t-PA due to unknown time of onset. 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 Pt intubated 11/23 to 11/26.       SLP Plan  Continue with current plan of care       Recommendations  Diet recommendations: Dysphagia 2 (fine chop);Nectar-thick liquid Liquids provided via: Cup Medication Administration: Whole meds with puree Supervision: Patient able to self feed;Intermittent supervision to cue for compensatory strategies Compensations: Slow rate;Small sips/bites;Minimize environmental distractions Postural Changes and/or Swallow Maneuvers: Seated upright 90 degrees                General recommendations: Rehab consult Oral Care Recommendations: Oral care BID Follow up Recommendations: Inpatient Rehab SLP Visit Diagnosis: Dysphagia, oropharyngeal phase (R13.12) Plan: Continue with current plan of  care       GO                Caleb Hubbard 01/13/2018, 11:20 AM

## 2018-01-13 NOTE — Evaluation (Signed)
Speech Language Pathology Evaluation Patient Details Name: Jakorian Marengo MRN: [id number] DOB: 1980-03-10 Today's Date: 01/13/2018 Time: 9242-6834 SLP Time Calculation (min) (ACUTE ONLY): 9 min  Problem List:  Patient Active Problem List   Diagnosis Date Noted  . Cancer ([place])   . Endotracheally intubated   . Stroke (cerebrum) ([place]) 01/03/2018  . 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 Anxiety   . Cancer ([place])   . Headache   . Hypertension   . Testicular mass    Past Surgical History:  Past Surgical History:  Procedure Laterality Date  . IR ANGIO INTRA EXTRACRAN SEL COM CAROTID INNOMINATE BILAT MOD SED  01/03/2018  . IR ANGIO VERTEBRAL SEL VERTEBRAL UNI L MOD SED  01/03/2018  . IR CT HEAD LTD  01/03/2018  . IR INTRA CRAN STENT  01/03/2018  . IR PERCUTANEOUS ART THROMBECTOMY/INFUSION INTRACRANIAL INC DIAG ANGIO  01/03/2018  . NO PAST SURGERIES    . ORCHIECTOMY Right 06/17/2017   Procedure: RIGHT RADICAL ORCHIECTOMY;  Surgeon: Ceasar Mons, MD;  Location: [facility];  Service: Urology;  Laterality: Right;  . RADIOLOGY WITH ANESTHESIA N/A 01/03/2018   Procedure: CODE STROKE;  Surgeon: Luanne Bras, MD;  Location: [facility];  Service: Radiology;  Laterality: N/A;   HPI:  Mr. Shaune Westfall is a 37 y.o. male with history of renal insufficiency and testicular cancer with lymph node metastasis who presented with AMS, rigors and dystonic posturing. He did not receive IV t-PA due to unknown time of onset. 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 Pt intubated 11/23 to 11/26.    Assessment / Plan / Recommendation Clinical Impression  Pt presents with decreased speech intelligibility impacting intelligibility at the word level d/t aphonia (hoarseness and very low vocal  intensity) s/p intubations. Pt also presents with impaired cognitive abilitieis as evidenced by deficits in selective attention, semi-complex problem solving, memory (storage/retrieval/recall) and overall awareness of deficits. ST is required to target the above mentioned deficits as well as explore possibility of RMT.     SLP Assessment  SLP Recommendation/Assessment: Patient needs continued Speech Lanaguage Pathology Services SLP Visit Diagnosis: Cognitive communication deficit (R41.841);Aphonia (R49.1)    Follow Up Recommendations  Inpatient Rehab    Frequency and Duration min 2x/week  2 weeks      SLP Evaluation Cognition  Overall Cognitive Status: Impaired/Different from baseline Arousal/Alertness: Awake/alert Orientation Level: Oriented X4 Attention: Selective Selective Attention: Impaired Selective Attention Impairment: Verbal basic;Functional basic Memory: Impaired Memory Impairment: Retrieval deficit;Decreased recall of new information;Decreased short term memory Decreased Short Term Memory: Verbal basic;Functional basic Awareness: Impaired Awareness Impairment: Emergent impairment Problem Solving: Impaired Problem Solving Impairment: Verbal complex;Functional complex Safety/Judgment: Impaired       Comprehension  Auditory Comprehension Overall Auditory Comprehension: Appears within functional limits for tasks assessed Yes/No Questions: Within Functional Limits Commands: Within Functional Limits Conversation: Simple Visual Recognition/Discrimination Discrimination: Not tested Reading Comprehension Reading Status: Not tested    Expression Expression Primary Mode of Expression: Verbal Verbal Expression Overall Verbal Expression: Impaired Initiation: Impaired Automatic Speech: Name;Day of week;Month of year;Social Response Level of Generative/Spontaneous Verbalization: Phrase   Oral / Motor  Oral Motor/Sensory Function Overall Oral Motor/Sensory Function:  Moderate impairment Motor Speech Overall Motor Speech: Impaired Respiration: Within functional limits Phonation: Aphonic;Low vocal intensity;Hoarse Resonance: Within functional limits Articulation: Within functional  limitis Intelligibility: Intelligibility reduced Word: 50-74% accurate Phrase: 25-49% accurate Sentence: 25-49% accurate Conversation: 0-24% accurate Motor Planning: Witnin functional limits Motor Speech Errors: Not applicable   GO                    Macarius Ruark 01/13/2018, 11:30 AM

## 2018-01-13 NOTE — Progress Notes (Signed)
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  -Dated on admission, admitted to ICU, he underwent revascularization with Neuro IR, and remained on a vent after the procedure.  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  -Neuro interventional radiology consulted, underwent revascularization with TICI 2b followed by rescue stent at the site of severe focal stenosis in mid basilar artery -Stable on aspirin and Brilinta -Unfortunately remains profoundly aphasic, hemiplegic, dysphagia as well -TEE noted EF of 65%, no PFO, Dopplers negative -LDL could not be calculated due to elevated triglycerides, hemoglobin A1c was 6.4 -UDS was positive for [facility] -SLP reevaluated 11/29, started dysphagia 2 diet -CIR evaluating needs aggressive rehabilitation -Improving, however remains tachycardic today -Cortrakk removed started dysphagia 2 diet  Right testicular tumor with nodal metastasis -Dr. Jana Hakim oncology following -Status post orchiectomy, currently on chemo with cisplatin and etoposide -Oncology does not feel he is hypercoagulable from testicular tumor or  chemo -Stopped Granix  Acute respiratory failure -Intubated for airway protection -Extubated 11/27 -Was followed by PCCM, now resolved  Neutropenia -Due to chemotherapy -Treated with daily Granix  -WBC is improving, ANC significantly up, discontinued Granix, WBC will continue to trend up due to multiple doses of Granix given  Hypertension -Now on amlodipine, clonidine, chlorthalidone -Continue beta-blocker as well, cut down clonidine dose  Tachycardia -Likely secondary to autonomic dysfunction from brainstem stroke -No evidence of underlying infection, diarrhea has resolved, UA and chest x-Caselli are unremarkable, abdominal exam is been benign  -I started him on a low-dose beta-blocker, despite this continues to remain tachycardic  -We will check CT angiogram to rule out pulmonary embolism  Tobacco abuse  Diarrhea -Likely due to tube feeds most likely as he was not having diarrhea before tube feeds -Imodium as needed Stopped TFs, diarrhea is better -discontinued Flexi-Seal  Hypokalemia -Due to diarrhea from tube feeds, replaced  DVT prophylaxis: SQ heparin  Code Status: FULL CODE Family Communication:  mother at bedside yesterday disposition needs CIR for aggressive rehab, tachycardia is better  Consultants:   Neuro   Procedures: 11/23 revascularization with TICI to be followed by rescue stent at the site of severe focal stenosis in mid basilar artery  Antimicrobials:    Subjective: -Diarrhea has resolved, heart rate remains in the low 100-1 20 range -Tolerating dysphagia 2 diet  Objective: Vitals:   01/13/18 0400 01/13/18 0500 01/13/18 0750 01/13/18 1218  BP: (!) 145/99  (!) 150/101 137/81  Pulse: (!) 114  (!) 119 (!) 105  Resp: 20  20 16   Temp: 98.2 F (36.8 C)  98.2 F (36.8 C) 98.7 F (37.1 C)  TempSrc: Oral  Oral Oral  SpO2: 100%  100% 100%  Weight:  61.1 kg    Height:        Intake/Output Summary (Last 24 hours) at  01/13/2018 1332 Last data  filed at 01/13/2018 1610 Gross per 24 hour  Intake 357 ml  Output 660 ml  Net -303 ml   Filed Weights   01/11/18 2353 01/12/18 0500 01/13/18 0500  Weight: 62.8 kg 63.4 kg 61.1 kg    Examination:  Gen: Awake, Alert, pleasant, nontoxic, opens eyes, makes eye contact, follows commands HEENT: PERRLA, Neck supple, no JVD, core track removed Lungs: Decreased breath sounds at both bases CVS: S1-S2/tachycardia Abd: soft, Non tender, non distended, BS present Extremities: No Cyanosis, Clubbing or edema Skin: no new rashes Central nervous system: Alert awake, follows commands, eyes open, starting to communicate a few words, left hemiplegia, right arm and right lower extremities 4+/5  Data Reviewed:   CBC: Recent Labs  Lab 01/07/18 0618 01/08/18 0429 01/09/18 0308 01/10/18 0916 01/11/18 0600 01/12/18 0603 01/13/18 0350  WBC 0.5* 0.7* 0.8* 2.0* 5.3 11.6* 13.7*  NEUTROABS 0.1* 0.2* 0.2* 1.0* 3.2  --   --   HGB 8.0* 8.1* 7.7* 7.8* 7.6* 7.6* 8.1*  HCT 23.5* 23.3* 21.9* 23.3* 23.0* 22.9* 24.8*  MCV 90.4 90.3 90.5 93.6 94.3 92.3 94.7  PLT PLATELET CLUMPS NOTED ON SMEAR, UNABLE TO ESTIMATE 86* 75* 106* 158 226 960   Basic Metabolic Panel: Recent Labs  Lab 01/07/18 0618  01/08/18 0429  01/09/18 0308 01/10/18 0916 01/11/18 0600 01/12/18 0603 01/13/18 0350  NA 133*   < > 133*   < > 133* 138 136 135 136  K 2.4*   < > 2.5*   < > 2.8* 4.8 3.9 3.4* 3.7  CL 97*   < > 97*   < > 101 101 99 95* 94*  CO2 25   < > 24   < > 23 22 22 25 28   GLUCOSE 100*   < > 117*   < > 141* 125* 126* 120* 119*  BUN 15   < > 15   < > 15 14 15 10 14   CREATININE 0.99   < > 0.96   < > 0.85 0.86 0.95 0.97 1.16  CALCIUM 8.6*   < > 8.8*   < > 8.8* 9.7 9.5 9.6 9.9  MG 1.6*  --  1.9  --  1.8  --   --   --   --   PHOS 2.3*  --  2.0*  --   --   --   --   --   --    < > = values in this interval not displayed.   GFR: Estimated Creatinine Clearance: 76.1 mL/min (by C-G formula based on SCr of 1.16 mg/dL). Liver  Function Tests: Recent Labs  Lab 01/09/18 0308  AST 71*  ALT 64*  ALKPHOS 83  BILITOT 0.7  PROT 6.5  ALBUMIN 2.9*   No results for input(s): LIPASE, AMYLASE in the last 168 hours. No results for input(s): AMMONIA in the last 168 hours. Coagulation Profile: No results for input(s): INR, PROTIME in the last 168 hours. Cardiac Enzymes: No results for input(s): CKTOTAL, CKMB, CKMBINDEX, TROPONINI in the last 168 hours. BNP (last 3 results) No results for input(s): PROBNP in the last 8760 hours. HbA1C: No results for input(s): HGBA1C in the last 72 hours. CBG: Recent Labs  Lab 01/12/18 1123 01/12/18 1723 01/12/18 2043 01/13/18 0745 01/13/18 1148  GLUCAP 118* 96 125* 110* 100*   Lipid Profile: No results for input(s): CHOL, HDL, LDLCALC, TRIG, CHOLHDL, LDLDIRECT in the last 72 hours. Thyroid Function Tests: No results for input(s):  TSH, T4TOTAL, FREET4, T3FREE, THYROIDAB in the last 72 hours. Anemia Panel: No results for input(s): VITAMINB12, FOLATE, FERRITIN, TIBC, IRON, RETICCTPCT in the last 72 hours. Urine analysis:    Component Value Date/Time   COLORURINE YELLOW 01/12/2018 1106   APPEARANCEUR CLEAR 01/12/2018 1106   LABSPEC 1.012 01/12/2018 1106   PHURINE 6.0 01/12/2018 1106   GLUCOSEU NEGATIVE 01/12/2018 1106   HGBUR NEGATIVE 01/12/2018 1106   BILIRUBINUR NEGATIVE 01/12/2018 1106   KETONESUR NEGATIVE 01/12/2018 1106   PROTEINUR NEGATIVE 01/12/2018 1106   UROBILINOGEN 0.2 09/13/2013 1330   NITRITE NEGATIVE 01/12/2018 1106   LEUKOCYTESUR NEGATIVE 01/12/2018 1106   Sepsis Labs: @LABRCNTIP (procalcitonin:4,lacticidven:4)  ) Recent Results (from the past 240 hour(s))  MRSA PCR Screening     Status: None   Collection Time: 01/03/18  6:37 PM  Result Value Ref Range Status   MRSA by PCR NEGATIVE NEGATIVE Final    Comment:        The GeneXpert MRSA Assay (FDA approved for NASAL specimens only), is one component of a comprehensive MRSA  colonization surveillance program. It is not intended to diagnose MRSA infection nor to guide or monitor treatment for MRSA infections. Performed at [facility], [place] [address] [place], [place] [address]          Radiology Studies: Dg Chest 2 View  Result Date: 01/12/2018 CLINICAL DATA:  Patient admitted on 11/23 for nausea and confusion, on chemo. Patient was intubated at one point. Right lateral obtained. EXAM: CHEST - 2 VIEW COMPARISON:  01/07/2018 FINDINGS: Improved aeration.  No focal infiltrate or overt edema. Heart size and mediastinal contours are within normal limits. No effusion. Feeding tube extends at least as far as the stomach, tip not seen. Residual contrast is noted in bowel in the left upper quadrant. IMPRESSION: No acute cardiopulmonary disease. Electronically Signed   By: Lucrezia Europe M.D.   On: 01/12/2018 13:40        Scheduled Meds: . amLODipine  10 mg Oral Daily  . aspirin  81 mg Oral Daily  . chlorhexidine  15 mL Mouth Rinse BID  . cloNIDine  0.1 mg Oral Daily  . feeding supplement (ENSURE ENLIVE)  237 mL Oral TID BM  . heparin injection (subcutaneous)  5,000 Units Subcutaneous Q8H  . mouth rinse  15 mL Mouth Rinse q12n4p  . metoprolol tartrate  25 mg Oral BID  . pantoprazole sodium  40 mg Oral Q24H  . ticagrelor  90 mg Oral BID   Continuous Infusions:    LOS: 10 days    Time spent: 16min    Domenic Polite, MD [facility] Page via [email or web address], password [email or web address] After 7PM please contact night-coverage  01/13/2018, 1:32 PM

## 2018-01-13 NOTE — Progress Notes (Signed)
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 Presents on 11/23 with nausea, altered mental status, dystonic posturing.  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   Vented 11/23.     PT Comments    Pt sat edge of bed and performed static and dynamic reaching activities.  Pt remains to lose balance sitting edge of bed when not supporting himself with RUE.  Pt continues to benefit from CIR therapies to improve strength and function.  I have discussed the patient's current level of function related to balance and strength deficits with the patient and his gf. They acknowledge understanding of this and do not feel the patient would be able to have their care needs met at home.  They are interested in post-acute rehab in an inpatient setting.      Follow Up Recommendations  CIR     Equipment Recommendations  None recommended by PT    Recommendations for Other Services Rehab consult     Precautions / Restrictions Precautions Precautions: Fall Restrictions Weight Bearing Restrictions: No    Mobility  Bed Mobility Overal bed mobility: Needs Assistance Bed Mobility: Rolling;Sidelying to Sit Rolling: Min assist Sidelying to sit: Mod assist Supine to sit: Mod assist Sit to supine: Mod assist;+2 for physical assistance;HOB elevated   General bed mobility comments: Once EOB initailly L lateral lean; able to maintain midline postural control with min A at times, LOB noted with posterior translation.    Transfers                    Ambulation/Gait                 Stairs             Wheelchair  Mobility    Modified Rankin (Stroke Patients Only) Modified Rankin (Stroke Patients Only) Pre-Morbid Rankin Score: No symptoms Modified Rankin: Severe disability     Balance Overall balance assessment: Needs assistance Sitting-balance support: Feet supported;Single extremity supported Sitting balance-Leahy Scale: Poor Sitting balance - Comments: occaisional min A; posterior pelvic tilt; kyphotic; forward head Postural control: Posterior lean                                  Cognition Arousal/Alertness: Awake/alert Behavior During Therapy: Flat affect Overall Cognitive Status: Impaired/Different from baseline Area of Impairment: Attention;Safety/judgement;Awareness;Problem solving                   Current Attention Level: Selective   Following Commands: Follows one step commands consistently Safety/Judgement: Decreased awareness of safety;Decreased awareness of deficits Awareness: Emergent Problem Solving: Slow processing;Decreased initiation;Difficulty sequencing;Requires verbal cues;Requires tactile cues General Comments: improved cognition; "level of arousal affecting performance; states he is very tired      Exercises      General Comments        Pertinent Vitals/Pain Pain Assessment: No/denies pain    Home Living                      Prior Function  PT Goals (current goals can now be found in the care plan section) Acute Rehab PT Goals Patient Stated Goal: to go home Potential to Achieve Goals: Fair Progress towards PT goals: Progressing toward goals    Frequency    Min 4X/week      PT Plan Current plan remains appropriate    Co-evaluation              AM-PAC PT "6 Clicks" Mobility   Outcome Measure  Help needed turning from your back to your side while in a flat bed without using bedrails?: A Lot Help needed moving from lying on your back to sitting on the side of a flat bed without using  bedrails?: A Lot Help needed moving to and from a bed to a chair (including a wheelchair)?: A Lot Help needed standing up from a chair using your arms (e.g., wheelchair or bedside chair)?: Total Help needed to walk in hospital room?: Total Help needed climbing 3-5 steps with a railing? 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

## 2018-01-14 ENCOUNTER — Other Ambulatory Visit: Payer: Self-pay

## 2018-01-14 DIAGNOSIS — C6210 Malignant neoplasm of unspecified descended testis: Secondary | ICD-10-CM

## 2018-01-14 DIAGNOSIS — E785 Hyperlipidemia, unspecified: Secondary | ICD-10-CM

## 2018-01-14 DIAGNOSIS — E781 Pure hyperglyceridemia: Secondary | ICD-10-CM

## 2018-01-14 LAB — GLUCOSE, CAPILLARY
Glucose-Capillary: 109 mg/dL — ABNORMAL HIGH (ref 70–99)
Glucose-Capillary: 120 mg/dL — ABNORMAL HIGH (ref 70–99)
Glucose-Capillary: 102 mg/dL — ABNORMAL HIGH (ref 70–99)
Glucose-Capillary: 122 mg/dL — ABNORMAL HIGH (ref 70–99)

## 2018-01-14 NOTE — Progress Notes (Signed)
Spoke with rehab coordinator, Colan Neptune, regarding patient's consult to inpatient rehab. She has been following this patient for medical readiness. She will see the patient and speak to the attending physician tomorrow 12/5 and try to admit patient to rehab then. Wendee Copp

## 2018-01-14 NOTE — Progress Notes (Signed)
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 Presents on 11/23 with nausea, altered mental status, dystonic posturing.  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   Vented 11/23.     PT Comments    Patient's tolerance to treatment today was good.  Patient was in bed with no visitors present upon PT arrival.  Patient was pleasant and agreeable to therapy initially but demonstrated moments of fatigue and frustration throughout session.  Pt was able to stand into stedy for 3 trials today.  He also completed LE exercises but continues to be functionally limited on L side.  I have discussed the patient's current level of function related to strength and mobility with the patient.  He acknowledges understanding of this and does not feel he would be able to have his care needs met at home.  He is interested in post-acute rehab in an inpatient setting.  Pt continues to be a good candidate for CIR placement at this time based on current functional status.   Follow Up Recommendations  CIR     Equipment Recommendations  None recommended by PT    Recommendations for Other Services Rehab consult     Precautions / Restrictions Precautions Precautions: Fall Precaution Comments: corretrack Restrictions Weight Bearing Restrictions: No    Mobility  Bed Mobility Overal bed mobility: Needs Assistance Bed Mobility: Supine to Sit     Supine to sit: Min assist;+2 for physical assistance     General bed mobility comments: Pt was able to sit up in bed with no assistance with flat HOB.  Pt required VC and min A to move B LE towards EOB.   Patient initiated movement with  R LE but required increased assistance to move L LE.  Transfers Overall transfer level: Needs assistance Equipment used: Ambulation equipment used Transfers: Sit to/from Stand Sit to Stand: Min assist;Mod assist;+2 physical assistance         General transfer comment: Pt required mod A +2 and VC to scoot to EOB.  Patient was able to hold front stedy bar with R hand.  Pt was unable to grip stedy bar with L hand and required support from therapist.  Pt completed 3 sit to stands to stedy requiring mod A+2 for first attempt and min A +2 for last 2 attempts.  Pt was able to pull into standing with R hand.    Ambulation/Gait                 Stairs             Wheelchair Mobility    Modified Rankin (Stroke Patients Only) Modified Rankin (Stroke Patients Only) Pre-Morbid Rankin Score: No symptoms Modified Rankin: Severe disability     Balance Overall balance assessment: Needs assistance Sitting-balance support: Feet supported;Single extremity supported Sitting balance-Leahy Scale: Poor Sitting balance - Comments: occasional minA for sitting balance, overall requires use of single UE support; attempted dynamic reaching activity however pt initiating return to supine with attempts (reports due to fatigue)  Postural control: Posterior lean Standing balance support: Bilateral upper extremity supported;Single extremity supported Standing balance-Leahy Scale: Poor Standing balance comment: reliant on UE support/ external assist  Cognition Arousal/Alertness: Awake/alert Behavior During Therapy: Flat affect Overall Cognitive Status: Impaired/Different from baseline Area of Impairment: Attention;Safety/judgement;Awareness;Problem solving                   Current Attention Level: Selective   Following Commands: Follows one step commands consistently Safety/Judgement: Decreased awareness of  safety;Decreased awareness of deficits Awareness: Emergent Problem Solving: Slow processing;Decreased initiation;Difficulty sequencing;Requires verbal cues;Requires tactile cues General Comments: pt overall following 1 step commands consistently this session, continues to require cue and increased time for processing      Exercises General Exercises - Lower Extremity Long Arc Quad: AROM;Right;10 reps;Seated(PROM; Left; 10 reps, Seated) Other Exercises Other Exercises: applied gentle stretching to LUE    General Comments        Pertinent Vitals/Pain Pain Assessment: No/denies pain    Home Living                      Prior Function            PT Goals (current goals can now be found in the care plan section) Acute Rehab PT Goals Patient Stated Goal: none stated PT Goal Formulation: With patient/family Time For Goal Achievement: 01/20/18 Potential to Achieve Goals: Fair Progress towards PT goals: Progressing toward goals    Frequency    Min 4X/week      PT Plan Current plan remains appropriate    Co-evaluation PT/OT/SLP Co-Evaluation/Treatment: Yes Reason for Co-Treatment: For patient/therapist safety;To address functional/ADL transfers PT goals addressed during session: Mobility/safety with mobility;Strengthening/ROM OT goals addressed during session: Proper use of Adaptive equipment and DME;Strengthening/ROM(mobility progression)      AM-PAC PT "6 Clicks" Mobility   Outcome Measure  Help needed turning from your back to your side while in a flat bed without using bedrails?: A Lot Help needed moving from lying on your back to sitting on the side of a flat bed without using bedrails?: A Lot Help needed moving to and from a bed to a chair (including a wheelchair)?: A Lot Help needed standing up from a chair using your arms (e.g., wheelchair or bedside chair)?: A Lot Help needed to walk in hospital room?: Total Help needed climbing 3-5 steps with a  railing? 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

## 2018-01-14 NOTE — Progress Notes (Signed)
Caleb Hubbard   DOB:Oct 26, 1980   KG#:[id number]   [id number]  Subjective:  Caleb Hubbard is lying on his left side in bed, knees tucked up. When addressed he opens eyes. He appears to listen. There is no obvious attempt to respond. No family in room  Objective: [name] examined in bed Vitals:   01/13/18 2338 01/14/18 0502  BP: 131/89 (!) 130/94  Pulse: 96 (!) 113  Resp: 18 18  Temp: 98.4 F (36.9 C) 98.5 F (36.9 C)  SpO2: 90% 100%    Body mass index is 18.79 kg/m.  Intake/Output Summary (Last 24 hours) at 01/14/2018 0720 Last data filed at 01/14/2018 0536 Gross per 24 hour  Intake -  Output 1200 ml  Net -1200 ml     CBG (last 3)  Recent Labs    01/13/18 1617 01/13/18 2151 01/14/18 0639  GLUCAP 127* 110* 109*     Labs:  Lab Results  Component Value Date   WBC 13.7 (H) 01/13/2018   HGB 8.1 (L) 01/13/2018   HCT 24.8 (L) 01/13/2018   MCV 94.7 01/13/2018   PLT 288 01/13/2018   NEUTROABS 3.2 01/11/2018    @LASTCHEMISTRY @  Urine Studies No results for input(s): UHGB, CRYS in the last 72 hours.  Invalid input(s): UACOL, UAPR, USPG, UPH, UTP, UGL, UKET, UBIL, UNIT, UROB, [place], UEPI, UWBC, [place], [place], [place], [place], [place]  Basic Metabolic Panel: Recent Labs  Lab 01/08/18 0429  01/09/18 0308 01/10/18 0916 01/11/18 0600 01/12/18 0603 01/13/18 0350  NA 133*   < > 133* 138 136 135 136  K 2.5*   < > 2.8* 4.8 3.9 3.4* 3.7  CL 97*   < > 101 101 99 95* 94*  CO2 24   < > 23 22 22 25 28   GLUCOSE 117*   < > 141* 125* 126* 120* 119*  BUN 15   < > 15 14 15 10 14   CREATININE 0.96   < > 0.85 0.86 0.95 0.97 1.16  CALCIUM 8.8*   < > 8.8* 9.7 9.5 9.6 9.9  MG 1.9  --  1.8  --   --   --   --   PHOS 2.0*  --   --   --   --   --   --    < > = values in this interval not displayed.   GFR Estimated Creatinine Clearance: 76.1 mL/min (by C-G formula based on SCr of 1.16 mg/dL). Liver Function Tests: Recent Labs  Lab 01/09/18 0308  AST 71*  ALT 64*  ALKPHOS 83   BILITOT 0.7  PROT 6.5  ALBUMIN 2.9*   No results for input(s): LIPASE, AMYLASE in the last 168 hours. No results for input(s): AMMONIA in the last 168 hours. Coagulation profile No results for input(s): INR, PROTIME in the last 168 hours.  CBC: Recent Labs  Lab 01/08/18 0429 01/09/18 0308 01/10/18 0916 01/11/18 0600 01/12/18 0603 01/13/18 0350  WBC 0.7* 0.8* 2.0* 5.3 11.6* 13.7*  NEUTROABS 0.2* 0.2* 1.0* 3.2  --   --   HGB 8.1* 7.7* 7.8* 7.6* 7.6* 8.1*  HCT 23.3* 21.9* 23.3* 23.0* 22.9* 24.8*  MCV 90.3 90.5 93.6 94.3 92.3 94.7  PLT 86* 75* 106* 158 226 288   Cardiac Enzymes: No results for input(s): CKTOTAL, CKMB, CKMBINDEX, TROPONINI in the last 168 hours. BNP: Invalid input(s): POCBNP CBG: Recent Labs  Lab 01/13/18 0745 01/13/18 1148 01/13/18 1617 01/13/18 2151 01/14/18 0639  GLUCAP 110* 100* 127* 110* 109*  D-Dimer No results for input(s): DDIMER in the last 72 hours. Hgb A1c No results for input(s): HGBA1C in the last 72 hours. Lipid Profile No results for input(s): CHOL, HDL, LDLCALC, TRIG, CHOLHDL, LDLDIRECT in the last 72 hours. Thyroid function studies No results for input(s): TSH, T4TOTAL, T3FREE, THYROIDAB in the last 72 hours.  Invalid input(s): FREET3 Anemia work up No results for input(s): VITAMINB12, FOLATE, FERRITIN, TIBC, IRON, RETICCTPCT in the last 72 hours. Microbiology No results found for this or any previous visit (from the past 240 hour(s)).    Studies:  Dg Chest 2 View  Result Date: 01/12/2018 CLINICAL DATA:  Patient admitted on 11/23 for nausea and confusion, on chemo. Patient was intubated at one point. Right lateral obtained. EXAM: CHEST - 2 VIEW COMPARISON:  01/07/2018 FINDINGS: Improved aeration.  No focal infiltrate or overt edema. Heart size and mediastinal contours are within normal limits. No effusion. Feeding tube extends at least as far as the stomach, tip not seen. Residual contrast is noted in bowel in the left upper  quadrant. IMPRESSION: No acute cardiopulmonary disease. Electronically Signed   By: Lucrezia Europe M.D.   On: 01/12/2018 13:40   Ct Angio Chest Pe W Or Wo Contrast  Result Date: 01/13/2018 CLINICAL DATA:  37 y/o M; unexplained tachycardia. History of testicular cancer receiving chemotherapy and brainstem stroke. EXAM: CT ANGIOGRAPHY CHEST WITH CONTRAST TECHNIQUE: Multidetector CT imaging of the chest was performed using the standard protocol during bolus administration of intravenous contrast. Multiplanar CT image reconstructions and MIPs were obtained to evaluate the vascular anatomy. CONTRAST:  180mL ISOVUE-370 IOPAMIDOL (ISOVUE-370) INJECTION 76% COMPARISON:  None. FINDINGS: Cardiovascular: Satisfactory opacification of the pulmonary arteries to the segmental level. No evidence of pulmonary embolism. Normal heart size. No pericardial effusion. Mediastinum/Nodes: No enlarged mediastinal, hilar, or axillary lymph nodes. Thyroid gland, trachea, and esophagus demonstrate no significant findings. Lungs/Pleura: Lungs are clear. No pleural effusion or pneumothorax. Small blebs in the lung apices. Upper Abdomen: No acute abnormality. Musculoskeletal: No chest wall abnormality. No acute or significant osseous findings. Review of the MIP images confirms the above findings. IMPRESSION: No pulmonary embolus identified.  Negative CTA of the chest. Electronically Signed   By: Kristine Garbe M.D.   On: 01/13/2018 17:38    Assessment: 37 y.o. 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 Slightly high WBC likely from recent granix--should resolve over next few days.   As far as I can tell, w/u for cause of stroke was nonspecific. Patient does have a long history of hypertension, had significant hypertriglyceridemia, and a HbA1c of 6.4.  From a testicular cancer point of view, I do not recommend proceeding with further chemotherapy given the patient's marginal functional status. His cancer prognosis is good. I suggest checking the AFP and HCG every 6 months for the next 2 years, then yearly for the next 3 years. If there is any movement in his markers additional testing can be considered.  I obtained markers today for  baseline-- they have been WNL at [facility]. I will follow peripherally and make sure markers are repeated in 6 months.   Please let me know if I can be of further help.    Chauncey Cruel, MD 01/14/2018  7:20 AM Medical Oncology and Hematology [facility] [address] [place], [place] [address] Tel. [phone]    Fax. [phone]

## 2018-01-14 NOTE — Progress Notes (Addendum)
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 Presents on 11/23 with nausea, altered mental status, dystonic posturing.  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   Vented 11/23.    OT comments  Pt progressing towards OT goals. Seen in conjunction with PT this session for mobility progression. Pt continues to require at least single UE support and intermittent minA to maintain sitting balance EOB. Pt completing multiple sit<>stands at [facility] this session initially with modA+2 progressed to minA+2, with use of Stedy for transfer OOB to recliner. Pt also reporting visual deficits, (double vision?) during session, will continue to assess and address during treatment sessions. Feel pt remains appropriate candidate for CIR level services at time of discharge. Will continue to follow acutely.    Follow Up Recommendations  CIR    Equipment Recommendations  3 in 1 bedside commode;Tub/shower bench          Precautions / Restrictions Precautions Precautions: Fall Precaution Comments: corretrack Restrictions Weight Bearing Restrictions: No       Mobility Bed Mobility Overal bed mobility: Needs Assistance Bed Mobility: Supine to Sit     Supine to sit: Min assist;+2 for physical assistance     General bed mobility comments: Pt was able to sit up in bed with no assistance with flat HOB.  Pt required VC and min A to move B LE towards EOB.  Patient initiated movement with  R LE but required increased assistance to move L LE.  Transfers Overall transfer level: Needs assistance Equipment used: Ambulation equipment  used Transfers: Sit to/from Stand Sit to Stand: Min assist;Mod assist;+2 physical assistance         General transfer comment: Pt required mod A +2 and VC to scoot to EOB.  Patient was able to hold front stedy bar with R hand.  Pt was unable to grip stedy bar with L hand and required support from therapist.  Pt completed 3 sit to stands to stedy requiring mod A+2 for first attempt and min A +2 for last 2 attempts.  Pt was able to pull into standing with R hand.      Balance Overall balance assessment: Needs assistance Sitting-balance support: Feet supported;Single extremity supported Sitting balance-Leahy Scale: Poor Sitting balance - Comments: occasional minA for sitting balance, overall requires use of single UE support; attempted dynamic reaching activity however pt initiating return to supine with attempts (reports due to fatigue)  Postural control: Posterior lean Standing balance support: Bilateral upper extremity supported;Single extremity supported Standing balance-Leahy Scale: Poor Standing balance comment: reliant on UE support/ external assist                            ADL either performed or assessed with clinical judgement   ADL Overall ADL's : Needs assistance/impaired Eating/Feeding: Minimal assistance;Sitting Eating/Feeding Details (indicate cue type and reason): dysphagia 2; per GF report pt should use spoon? utilized spoon initially however pt frustrated with this method, provided supervision while pt took sips of juice (modified consistency), though requires cues for small sips and to pause to swallow  General ADL Comments: use of Stedy for transfer OOB to chair during this session; pt with improvements in mobility, requires encouragement intermittently during session to participate     Vision   Additional Comments: pt reports double vision and requires increased effort to focus on TV, reports images are  side by side. will continue to assess and treat appropriate   Perception     Praxis      Cognition Arousal/Alertness: Awake/alert Behavior During Therapy: Flat affect Overall Cognitive Status: Impaired/Different from baseline Area of Impairment: Attention;Safety/judgement;Awareness;Problem solving                   Current Attention Level: Selective   Following Commands: Follows one step commands consistently Safety/Judgement: Decreased awareness of safety;Decreased awareness of deficits Awareness: Emergent Problem Solving: Slow processing;Decreased initiation;Difficulty sequencing;Requires verbal cues;Requires tactile cues General Comments: pt overall following 1 step commands consistently this session, continues to require cue and increased time for processing        Exercises Exercises: Other exercises General Exercises - Lower Extremity Long Arc Quad: AROM;Right;10 reps;Seated(PROM; Left; 10 reps, Seated) Other Exercises Other Exercises: applied gentle stretching to LUE   Shoulder Instructions       General Comments      Pertinent Vitals/ Pain       Pain Assessment: No/denies pain Pain Score: (unable to rate d/t refusal, pt repositioned to decrease)  Home Living                                          Prior Functioning/Environment              Frequency  Min 2X/week        Progress Toward Goals  OT Goals(current goals can now be found in the care plan section)  Progress towards OT goals: Progressing toward goals  Acute Rehab OT Goals Patient Stated Goal: none stated OT Goal Formulation: With patient Time For Goal Achievement: 01/20/18 Potential to Achieve Goals: Good  Plan Discharge plan remains appropriate    Co-evaluation    PT/OT/SLP Co-Evaluation/Treatment: Yes Reason for Co-Treatment: For patient/therapist safety;To address functional/ADL transfers PT goals addressed during session: Mobility/safety with  mobility;Strengthening/ROM OT goals addressed during session: Proper use of Adaptive equipment and DME;Strengthening/ROM(mobility progression)      AM-PAC OT "6 Clicks" Daily Activity     Outcome Measure   Help from another person eating meals?: A Little Help from another person taking care of personal grooming?: A Little Help from another person toileting, which includes using toliet, bedpan, or urinal?: A Lot Help from another person bathing (including washing, rinsing, drying)?: A Lot Help from another person to put on and taking off regular upper body clothing?: A Lot Help from another person to put on and taking off regular lower body clothing?: A Lot 6 Click Score: 14    End of Session Equipment Utilized During Treatment: Gait belt  OT Visit Diagnosis: Other abnormalities of gait and mobility (R26.89);Hemiplegia and hemiparesis Hemiplegia - Right/Left: Left Hemiplegia - dominant/non-dominant: Non-Dominant Hemiplegia - caused by: Cerebral infarction   Activity Tolerance Patient tolerated treatment well   Patient Left in chair;with call bell/phone within reach;with chair alarm set   Nurse Communication Mobility status        Time: 1001-1030 OT Time Calculation (min): 29 min  Charges: OT General Charges $OT Visit: 1 Visit OT Treatments $Therapeutic Activity: 8-22 mins  Lou Cal, OT [facility] Pager [phone] Office [phone]   Raymondo Band 01/14/2018, 12:05 PM

## 2018-01-14 NOTE — Progress Notes (Signed)
  Speech Language Pathology Treatment: Cognitive-Linquistic  Patient Details Name: Caleb Hubbard MRN: [id number] DOB: February 28, 1980 Today's Date: 01/14/2018 Time: 0920-0930 SLP Time Calculation (min) (ACUTE ONLY): 10 min  Assessment / Plan / Recommendation Clinical Impression  Skilled treatment session focused on communication goals. SLP received pt in bed with intent to trial ice chips. However pt refused and gestured information with no attempt to voice. Pt is able to produce voicing on demand but it is hoarse/breathy/low vocal intensity. Pt required Max A verbal cues to even attempt to voice instead of gesture. However pt was able to voice at the phrase level with Max A cues needed to increase vocal intensity. Pt able to achieve increased speech intelligibility but unable to communicate what was causing discomfort to goin and lower back area. SLP notified nursing but assisted pt with repositioning in bed. Nursing notified and pt left side-laying in bed with all needs within reach.    HPI HPI: Mr. Kore Madlock is a 37 y.o. male with history of renal insufficiency and testicular cancer with lymph node metastasis who presented with AMS, rigors and dystonic posturing. He did not receive IV t-PA due to unknown time of onset. 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 Pt intubated 11/23 to 11/26.       SLP Plan  Continue with current plan of care       Recommendations  Diet recommendations: Dysphagia 2 (fine chop);Nectar-thick liquid Liquids provided via: Cup Medication Administration: Whole meds with puree Supervision: Patient able to self feed;Intermittent supervision to cue for compensatory strategies Compensations: Slow rate;Small sips/bites;Minimize environmental distractions Postural Changes and/or Swallow Maneuvers: Seated upright 90 degrees               General recommendations: Rehab consult Oral Care Recommendations: Oral care BID Follow up Recommendations: Inpatient Rehab SLP Visit Diagnosis: Aphonia (R49.1) Plan: Continue with current plan of care       [place] 01/14/2018, 9:31 AM

## 2018-01-14 NOTE — Progress Notes (Signed)
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 -he presentedw/nausea, altered mental status, anddystonic posturing, w/u revealed Bcerebellar, brainstem,and occipital infarcts fromabasilar artery occlusion.  -Dated on admission, admitted to ICU, he underwent revascularization withNeuro IR, and remained onavent aftertheprocedure.  -now with aphasia, dysphagia, left hemiplegia -Extubated,  transferred from neurology to [facility] service -Hospitalization also complicated by neutropenia from chemotherapy -Cortrak placed, started tube feeds, then developed profuse diarrhea from tube feeding CIR evaluating  Assessment & Plan:   Active Problems:   Stroke (cerebrum) (HCC)   Basilar artery occlusion   Endotracheally intubated   Cancer (HCC)   Acute CVA,  Bilateral cerebellar,b rainstem, and occipital infarcts due to basilar artery occlusion.  IR consulted, underwent revascularization with a stent at the site of focal stenosis.  Stable on aspirin and Brilinta.  TEE noted, no PFO.  SLP started on dysphagia 2 diet.  CIR consulted and evaluating.    Right testicular tumour with nodal metastasis:  Oncology consulted and recommendations given.  No plans for chemo at this time.    Acute respiratory failure intubated for air protection and extubated and transferred to [facility] service. No issues.    Neutropenia from chemo,  Was on Granix, .  Monitor.    Hypertension:  Well controlled.    Tachycardia: Pt appears to be asymptomatic.  Rate between 90 to 110/min.    Diarrhea : appears to have resolved.    Hypokalemia: replaced.       DVT prophylaxis: heparin.  Code Status: full code.  Family Communication: None at  bedside.  Disposition Plan: pending CIR..   Consultants:   NEURO.   IR    Procedures: 11/23 revascularization with TICI to be followed by rescue stent at the site of severe focal stenosis in mid basilar artery  Antimicrobials:NONE.     Subjective: NO NEW COMPLAINTS.   Objective: Vitals:   01/13/18 2338 01/14/18 0502 01/14/18 0825 01/14/18 1145  BP: 131/89 (!) 130/94 117/82 (!) 131/99  Pulse: 96 (!) 113 (!) 119 (!) 112  Resp: 18 18 19 15   Temp: 98.4 F (36.9 C) 98.5 F (36.9 C) 98.7 F (37.1 C) 98.4 F (36.9 C)  TempSrc: Oral Oral Oral Oral  SpO2: 90% 100% 100% 99%  Weight:      Height:        Intake/Output Summary (Last 24 hours) at 01/14/2018 1745 Last data filed at 01/14/2018 0536 Gross per 24 hour  Intake -  Output 1200 ml  Net -1200 ml   Filed Weights   01/11/18 2353 01/12/18 0500 01/13/18 0500  Weight: 62.8 kg 63.4 kg 61.1 kg    Examination:  General exam: Appears calm and comfortable . APHASIC, but following simple commands.  Respiratory system: diminished at bases, no wheezing or rhonchi.  Cardiovascular system: S1 & S2 heard, tachycardic.  Gastrointestinal system: Abdomen is nondistended, soft and nontender. No organomegaly or masses felt. Normal bowel sounds heard. Central nervous system: Alert following simple commands. Able to say small words. Left hemiplegia.  Extremities: no cyanosis or clubbing.  Skin: No rashes, lesions or ulcers Psychiatry: no agitation noted.     Data Reviewed: I have personally reviewed following labs and imaging studies  CBC: Recent Labs  Lab 01/08/18 0429 01/09/18 0308 01/10/18  1884 01/11/18 0600 01/12/18 0603 01/13/18 0350  WBC 0.7* 0.8* 2.0* 5.3 11.6* 13.7*  NEUTROABS 0.2* 0.2* 1.0* 3.2  --   --   HGB 8.1* 7.7* 7.8* 7.6* 7.6* 8.1*  HCT 23.3* 21.9* 23.3* 23.0* 22.9* 24.8*  MCV 90.3 90.5 93.6 94.3 92.3 94.7  PLT 86* 75* 106* 158 226 166   Basic Metabolic Panel: Recent Labs  Lab 01/08/18 0429   01/09/18 0308 01/10/18 0916 01/11/18 0600 01/12/18 0603 01/13/18 0350  NA 133*   < > 133* 138 136 135 136  K 2.5*   < > 2.8* 4.8 3.9 3.4* 3.7  CL 97*   < > 101 101 99 95* 94*  CO2 24   < > 23 22 22 25 28   GLUCOSE 117*   < > 141* 125* 126* 120* 119*  BUN 15   < > 15 14 15 10 14   CREATININE 0.96   < > 0.85 0.86 0.95 0.97 1.16  CALCIUM 8.8*   < > 8.8* 9.7 9.5 9.6 9.9  MG 1.9  --  1.8  --   --   --   --   PHOS 2.0*  --   --   --   --   --   --    < > = values in this interval not displayed.   GFR: Estimated Creatinine Clearance: 76.1 mL/min (by C-G formula based on SCr of 1.16 mg/dL). Liver Function Tests: Recent Labs  Lab 01/09/18 0308  AST 71*  ALT 64*  ALKPHOS 83  BILITOT 0.7  PROT 6.5  ALBUMIN 2.9*   No results for input(s): LIPASE, AMYLASE in the last 168 hours. No results for input(s): AMMONIA in the last 168 hours. Coagulation Profile: No results for input(s): INR, PROTIME in the last 168 hours. Cardiac Enzymes: No results for input(s): CKTOTAL, CKMB, CKMBINDEX, TROPONINI in the last 168 hours. BNP (last 3 results) No results for input(s): PROBNP in the last 8760 hours. HbA1C: No results for input(s): HGBA1C in the last 72 hours. CBG: Recent Labs  Lab 01/13/18 1617 01/13/18 2151 01/14/18 0639 01/14/18 1141 01/14/18 1629  GLUCAP 127* 110* 109* 120* 102*   Lipid Profile: No results for input(s): CHOL, HDL, LDLCALC, TRIG, CHOLHDL, LDLDIRECT in the last 72 hours. Thyroid Function Tests: No results for input(s): TSH, T4TOTAL, FREET4, T3FREE, THYROIDAB in the last 72 hours. Anemia Panel: No results for input(s): VITAMINB12, FOLATE, FERRITIN, TIBC, IRON, RETICCTPCT in the last 72 hours. Sepsis Labs: No results for input(s): PROCALCITON, LATICACIDVEN in the last 168 hours.  No results found for this or any previous visit (from the past 240 hour(s)).       Radiology Studies: Ct Angio Chest Pe W Or Wo Contrast  Result Date: 01/13/2018 CLINICAL DATA:  37  y/o M; unexplained tachycardia. History of testicular cancer receiving chemotherapy and brainstem stroke. EXAM: CT ANGIOGRAPHY CHEST WITH CONTRAST TECHNIQUE: Multidetector CT imaging of the chest was performed using the standard protocol during bolus administration of intravenous contrast. Multiplanar CT image reconstructions and MIPs were obtained to evaluate the vascular anatomy. CONTRAST:  140mL ISOVUE-370 IOPAMIDOL (ISOVUE-370) INJECTION 76% COMPARISON:  None. FINDINGS: Cardiovascular: Satisfactory opacification of the pulmonary arteries to the segmental level. No evidence of pulmonary embolism. Normal heart size. No pericardial effusion. Mediastinum/Nodes: No enlarged mediastinal, hilar, or axillary lymph nodes. Thyroid gland, trachea, and esophagus demonstrate no significant findings. Lungs/Pleura: Lungs are clear. No pleural effusion or pneumothorax. Small blebs in the lung apices. Upper Abdomen: No acute abnormality.  Musculoskeletal: No chest wall abnormality. No acute or significant osseous findings. Review of the MIP images confirms the above findings. IMPRESSION: No pulmonary embolus identified.  Negative CTA of the chest. Electronically Signed   By: Kristine Garbe M.D.   On: 01/13/2018 17:38        Scheduled Meds: . amLODipine  10 mg Oral Daily  . aspirin  81 mg Oral Daily  . chlorhexidine  15 mL Mouth Rinse BID  . cloNIDine  0.1 mg Oral Daily  . feeding supplement (ENSURE ENLIVE)  237 mL Oral TID BM  . heparin injection (subcutaneous)  5,000 Units Subcutaneous Q8H  . mouth rinse  15 mL Mouth Rinse q12n4p  . metoprolol tartrate  25 mg Oral BID  . pantoprazole sodium  40 mg Oral Q24H  . ticagrelor  90 mg Oral BID   Continuous Infusions:   LOS: 11 days    Time spent: 26 minutes.     Hosie Poisson, MD [facility] Pager [phone]  If 7PM-7AM, please contact night-coverage [email or web address] Password [email or web address] 01/14/2018, 5:45 PM

## 2018-01-15 ENCOUNTER — Inpatient Hospital Stay (HOSPITAL_COMMUNITY)
Admission: RE | Admit: 2018-01-15 | Discharge: 2018-02-09 | DRG: 057 | Disposition: A | Discharge status: Home / Self Care | Payer: Medicaid Other | Source: Intra-hospital | Attending: Physical Medicine & Rehabilitation | Admitting: Physical Medicine & Rehabilitation

## 2018-01-15 ENCOUNTER — Other Ambulatory Visit: Payer: Self-pay

## 2018-01-15 ENCOUNTER — Encounter (HOSPITAL_COMMUNITY): Payer: Self-pay | Admitting: Nurse Practitioner

## 2018-01-15 DIAGNOSIS — Z8547 Personal history of malignant neoplasm of testis: Secondary | ICD-10-CM | POA: Diagnosis not present

## 2018-01-15 DIAGNOSIS — H53469 Homonymous bilateral field defects, unspecified side: Secondary | ICD-10-CM | POA: Diagnosis present

## 2018-01-15 DIAGNOSIS — F329 Major depressive disorder, single episode, unspecified: Secondary | ICD-10-CM | POA: Diagnosis present

## 2018-01-15 DIAGNOSIS — D702 Other drug-induced agranulocytosis: Secondary | ICD-10-CM

## 2018-01-15 DIAGNOSIS — I651 Occlusion and stenosis of basilar artery: Secondary | ICD-10-CM | POA: Diagnosis present

## 2018-01-15 DIAGNOSIS — Z9221 Personal history of antineoplastic chemotherapy: Secondary | ICD-10-CM | POA: Diagnosis not present

## 2018-01-15 DIAGNOSIS — F1210 Cannabis abuse, uncomplicated: Secondary | ICD-10-CM | POA: Diagnosis present

## 2018-01-15 DIAGNOSIS — G8110 Spastic hemiplegia affecting unspecified side: Secondary | ICD-10-CM

## 2018-01-15 DIAGNOSIS — F17210 Nicotine dependence, cigarettes, uncomplicated: Secondary | ICD-10-CM | POA: Diagnosis present

## 2018-01-15 DIAGNOSIS — I69391 Dysphagia following cerebral infarction: Secondary | ICD-10-CM | POA: Diagnosis not present

## 2018-01-15 DIAGNOSIS — Z818 Family history of other mental and behavioral disorders: Secondary | ICD-10-CM

## 2018-01-15 DIAGNOSIS — I69320 Aphasia following cerebral infarction: Secondary | ICD-10-CM | POA: Diagnosis not present

## 2018-01-15 DIAGNOSIS — E785 Hyperlipidemia, unspecified: Secondary | ICD-10-CM | POA: Diagnosis present

## 2018-01-15 DIAGNOSIS — E639 Nutritional deficiency, unspecified: Secondary | ICD-10-CM

## 2018-01-15 DIAGNOSIS — I10 Essential (primary) hypertension: Secondary | ICD-10-CM | POA: Diagnosis present

## 2018-01-15 DIAGNOSIS — R7303 Prediabetes: Secondary | ICD-10-CM | POA: Diagnosis present

## 2018-01-15 DIAGNOSIS — E781 Pure hyperglyceridemia: Secondary | ICD-10-CM | POA: Diagnosis present

## 2018-01-15 DIAGNOSIS — Z823 Family history of stroke: Secondary | ICD-10-CM

## 2018-01-15 DIAGNOSIS — F419 Anxiety disorder, unspecified: Secondary | ICD-10-CM | POA: Diagnosis present

## 2018-01-15 DIAGNOSIS — R414 Neurologic neglect syndrome: Secondary | ICD-10-CM | POA: Diagnosis present

## 2018-01-15 DIAGNOSIS — G479 Sleep disorder, unspecified: Secondary | ICD-10-CM

## 2018-01-15 DIAGNOSIS — I69354 Hemiplegia and hemiparesis following cerebral infarction affecting left non-dominant side: Secondary | ICD-10-CM | POA: Diagnosis present

## 2018-01-15 DIAGNOSIS — R1310 Dysphagia, unspecified: Secondary | ICD-10-CM | POA: Diagnosis present

## 2018-01-15 DIAGNOSIS — R1312 Dysphagia, oropharyngeal phase: Secondary | ICD-10-CM | POA: Diagnosis present

## 2018-01-15 LAB — CBC
HCT: 27.5 % — ABNORMAL LOW (ref 39.0–52.0)
Hemoglobin: 9.2 g/dL — ABNORMAL LOW (ref 13.0–17.0)
MCH: 31.6 pg (ref 26.0–34.0)
MCHC: 33.5 g/dL (ref 30.0–36.0)
MCV: 94.5 fL (ref 80.0–100.0)
Platelets: 362 10*3/uL (ref 150–400)
RBC: 2.91 MIL/uL — ABNORMAL LOW (ref 4.22–5.81)
RDW: 15.8 % — ABNORMAL HIGH (ref 11.5–15.5)
WBC: 10.5 10*3/uL (ref 4.0–10.5)
nRBC: 0.6 % — ABNORMAL HIGH (ref 0.0–0.2)

## 2018-01-15 LAB — LUPUS ANTICOAGULANT PANEL: PTT Lupus Anticoagulant: 48.9 s (ref 0.0–51.9)

## 2018-01-15 LAB — GLUCOSE, CAPILLARY
Glucose-Capillary: 103 mg/dL — ABNORMAL HIGH (ref 70–99)
Glucose-Capillary: 125 mg/dL — ABNORMAL HIGH (ref 70–99)
Glucose-Capillary: 118 mg/dL — ABNORMAL HIGH (ref 70–99)
Glucose-Capillary: 119 mg/dL — ABNORMAL HIGH (ref 70–99)

## 2018-01-15 LAB — BETA HCG QUANT (REF LAB): hCG Quant: <1 m[IU]/mL (ref 0–3)

## 2018-01-15 LAB — AFP TUMOR MARKER: AFP, Serum, Tumor Marker: 2.5 ng/mL (ref 0.0–8.3)

## 2018-01-15 MED ORDER — CHLORHEXIDINE GLUCONATE 0.12 % MT SOLN
15.0000 mL | Freq: Two times a day (BID) | OROMUCOSAL | Status: DC
  Administered 2018-01-16 – 2018-02-07 (×33): 15 mL via OROMUCOSAL
  Filled 2018-01-15 (×44): qty 15

## 2018-01-15 MED ORDER — RESOURCE THICKENUP CLEAR PO POWD
ORAL | Status: DC | PRN
  Filled 2018-01-15: qty 125

## 2018-01-15 MED ORDER — ALUM & MAG HYDROXIDE-SIMETH 200-200-20 MG/5ML PO SUSP: 30.0000 mL | ORAL | Status: DC | PRN

## 2018-01-15 MED ORDER — POLYETHYLENE GLYCOL 3350 17 G PO PACK: 17.0000 g | PACK | Freq: Every day | ORAL | Status: DC | PRN

## 2018-01-15 MED ORDER — DIPHENHYDRAMINE HCL 12.5 MG/5ML PO ELIX: 12.5000 mg | ORAL_SOLUTION | Freq: Four times a day (QID) | ORAL | Status: DC | PRN

## 2018-01-15 MED ORDER — METOPROLOL TARTRATE 25 MG PO TABS
25.0000 mg | ORAL_TABLET | Freq: Two times a day (BID) | ORAL | Status: DC
  Administered 2018-01-15 – 2018-02-09 (×46): 25 mg via ORAL
  Filled 2018-01-15 (×50): qty 1

## 2018-01-15 MED ORDER — METOPROLOL TARTRATE 25 MG PO TABS: 25.0000 mg | ORAL_TABLET | Freq: Two times a day (BID) | ORAL | Status: DC

## 2018-01-15 MED ORDER — ACETAMINOPHEN 325 MG PO TABS
325.0000 mg | ORAL_TABLET | ORAL | Status: DC | PRN
  Administered 2018-01-19 – 2018-01-23 (×2): 650 mg via ORAL
  Administered 2018-01-27: 325 mg via ORAL
  Administered 2018-02-08: 650 mg via ORAL
  Filled 2018-01-15 (×4): qty 2

## 2018-01-15 MED ORDER — GUAIFENESIN-DM 100-10 MG/5ML PO SYRP: 5.0000 mL | ORAL_SOLUTION | Freq: Four times a day (QID) | ORAL | Status: DC | PRN

## 2018-01-15 MED ORDER — BISACODYL 10 MG RE SUPP
10.0000 mg | Freq: Every day | RECTAL | Status: DC | PRN
  Administered 2018-01-16 – 2018-01-29 (×4): 10 mg via RECTAL
  Filled 2018-01-15 (×6): qty 1

## 2018-01-15 MED ORDER — ATORVASTATIN CALCIUM 80 MG PO TABS: 80.0000 mg | ORAL_TABLET | Freq: Every day | ORAL | Status: DC

## 2018-01-15 MED ORDER — FLEET ENEMA 7-19 GM/118ML RE ENEM
1.0000 | ENEMA | Freq: Once | RECTAL | Status: AC | PRN
  Administered 2018-01-29: 1 via RECTAL
  Filled 2018-01-15: qty 1

## 2018-01-15 MED ORDER — PRO-STAT SUGAR FREE PO LIQD
30.0000 mL | Freq: Two times a day (BID) | ORAL | Status: DC
  Administered 2018-01-15 – 2018-02-09 (×41): 30 mL via ORAL
  Filled 2018-01-15 (×45): qty 30

## 2018-01-15 MED ORDER — HEPARIN SODIUM (PORCINE) 5000 UNIT/ML IJ SOLN
5000.0000 [IU] | Freq: Three times a day (TID) | INTRAMUSCULAR | Status: DC
  Administered 2018-01-15 – 2018-01-16 (×2): 5000 [IU] via SUBCUTANEOUS
  Filled 2018-01-15 (×2): qty 1

## 2018-01-15 MED ORDER — ASPIRIN 81 MG PO CHEW
81.0000 mg | CHEWABLE_TABLET | Freq: Every day | ORAL | Status: DC
  Administered 2018-01-16 – 2018-02-09 (×25): 81 mg via ORAL
  Filled 2018-01-15 (×25): qty 1

## 2018-01-15 MED ORDER — PROCHLORPERAZINE EDISYLATE 10 MG/2ML IJ SOLN: 5.0000 mg | Freq: Four times a day (QID) | INTRAMUSCULAR | Status: DC | PRN

## 2018-01-15 MED ORDER — CLONIDINE HCL 0.1 MG PO TABS
0.1000 mg | ORAL_TABLET | Freq: Every day | ORAL | Status: DC
  Administered 2018-01-16 – 2018-02-09 (×24): 0.1 mg via ORAL
  Filled 2018-01-15 (×25): qty 1

## 2018-01-15 MED ORDER — AMLODIPINE BESYLATE 10 MG PO TABS
10.0000 mg | ORAL_TABLET | Freq: Every day | ORAL | Status: DC
  Administered 2018-01-16 – 2018-02-09 (×24): 10 mg via ORAL
  Filled 2018-01-15 (×25): qty 1

## 2018-01-15 MED ORDER — PROCHLORPERAZINE MALEATE 5 MG PO TABS: 5.0000 mg | ORAL_TABLET | Freq: Four times a day (QID) | ORAL | Status: DC | PRN

## 2018-01-15 MED ORDER — SENNOSIDES-DOCUSATE SODIUM 8.6-50 MG PO TABS
1.0000 | ORAL_TABLET | Freq: Every evening | ORAL | Status: DC | PRN
  Administered 2018-01-22 – 2018-02-04 (×3): 1 via ORAL
  Filled 2018-01-15 (×3): qty 1

## 2018-01-15 MED ORDER — TICAGRELOR 90 MG PO TABS: 90.0000 mg | ORAL_TABLET | Freq: Two times a day (BID) | ORAL | Status: DC

## 2018-01-15 MED ORDER — TICAGRELOR 90 MG PO TABS
90.0000 mg | ORAL_TABLET | Freq: Two times a day (BID) | ORAL | Status: DC
  Administered 2018-01-15 – 2018-02-09 (×49): 90 mg via ORAL
  Filled 2018-01-15 (×50): qty 1

## 2018-01-15 MED ORDER — PROCHLORPERAZINE 25 MG RE SUPP: 12.5000 mg | Freq: Four times a day (QID) | RECTAL | Status: DC | PRN

## 2018-01-15 MED ORDER — INSULIN ASPART 100 UNIT/ML ~~LOC~~ SOLN
0.0000 [IU] | Freq: Three times a day (TID) | SUBCUTANEOUS | Status: DC
  Administered 2018-01-17: 2 [IU] via SUBCUTANEOUS

## 2018-01-15 MED ORDER — PANTOPRAZOLE SODIUM 40 MG PO PACK: 40.0000 mg | PACK | ORAL | Status: DC

## 2018-01-15 MED ORDER — ASPIRIN 81 MG PO CHEW: 81.0000 mg | CHEWABLE_TABLET | Freq: Every day | ORAL | Status: AC

## 2018-01-15 MED ORDER — CLONIDINE HCL 0.1 MG PO TABS: 0.1000 mg | ORAL_TABLET | Freq: Every day | ORAL | 11 refills | Status: DC

## 2018-01-15 MED ORDER — INSULIN ASPART 100 UNIT/ML ~~LOC~~ SOLN: 0.0000 [IU] | Freq: Every day | SUBCUTANEOUS | Status: DC

## 2018-01-15 MED ORDER — ATORVASTATIN CALCIUM 80 MG PO TABS
80.0000 mg | ORAL_TABLET | Freq: Every day | ORAL | Status: DC
  Administered 2018-01-15 – 2018-02-08 (×25): 80 mg via ORAL
  Filled 2018-01-15 (×25): qty 1

## 2018-01-15 MED ORDER — ENSURE ENLIVE PO LIQD: 237.0000 mL | Freq: Three times a day (TID) | ORAL | 12 refills | Status: DC

## 2018-01-15 MED ORDER — ORAL CARE MOUTH RINSE
15.0000 mL | Freq: Two times a day (BID) | OROMUCOSAL | Status: DC
  Administered 2018-01-15 – 2018-02-03 (×25): 15 mL via OROMUCOSAL

## 2018-01-15 MED ORDER — PANTOPRAZOLE SODIUM 40 MG PO PACK
40.0000 mg | PACK | ORAL | Status: DC
  Administered 2018-01-16 – 2018-01-18 (×3): 40 mg via ORAL
  Filled 2018-01-15: qty 20

## 2018-01-15 MED ORDER — RESOURCE THICKENUP CLEAR PO POWD: ORAL | Status: DC

## 2018-01-15 MED ORDER — TRAZODONE HCL 50 MG PO TABS
25.0000 mg | ORAL_TABLET | Freq: Every evening | ORAL | Status: DC | PRN
  Administered 2018-01-15: 50 mg via ORAL
  Filled 2018-01-15: qty 1

## 2018-01-15 NOTE — Progress Notes (Signed)
IP rehab admissions - A bed has become available on inpatient rehab and will admit to CIR today.  Patient has been cleared by attending MD for admit today.  Call me for questions.  [phone]

## 2018-01-15 NOTE — Discharge Summary (Signed)
Physician Discharge Summary  Caleb Hubbard QBH:[id number] DOB: 1980/12/17 DOA: 01/03/2018  PCP: [facility]  Admit date: 01/03/2018 Discharge date: 01/15/2018  Admitted From: Home Disposition:  cir  Recommendations for Outpatient Follow-up:  1. Follow up with PCP in 1-2 weeks 2. Please obtain BMP/CBC in one week Please follow up with neurology as recommended.   Discharge Condition:stable.  CODE STATUS: full code.  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 -he presentedw/nausea, altered mental status, anddystonic posturing, w/u revealed Bcerebellar, brainstem,and occipital infarcts fromabasilar artery occlusion. -Dated on admission, admitted to ICU, he underwent revascularization withNeuro IR, and remained onavent aftertheprocedure.  -now with aphasia, dysphagia, left hemiplegia -Extubated, transferred from neurology to [facility] service -Hospitalization also complicated by neutropenia from chemotherapy Pt has improved and is medically stable for CIR admission.   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  IR consulted, underwent revascularization with a stent at the site of focal stenosis.  Stable on aspirin and Brilinta.  TEE noted, no PFO.  SLP started on dysphagia 2 diet.  CIR consulted and evaluating.    Right testicular tumour with nodal metastasis:  Oncology consulted and recommendations given.  No plans for chemo at this time.    Acute respiratory failure intubated for air protection and extubated and transferred to [facility] service. No issues.    Neutropenia from  chemo,  Was on Granix, .  Monitor.    Hypertension:  Well controlled.    Tachycardia: Pt appears to be asymptomatic.  Rate between 90 to 110/min.    Diarrhea : appears to have resolved.    Hypokalemia: replaced.   hyperlipidemia lipitor started.    Discharge Instructions  Discharge Instructions    Diet - low sodium heart healthy   Complete by:  As directed    Discharge instructions   Complete by:  As directed    Please follow up with neurology as recommended.     Allergies as of 01/15/2018   No Known Allergies     Medication List    STOP taking these medications   amLODipine 10 MG tablet Commonly known as:  NORVASC   chlorthalidone 25 MG tablet Commonly known as:  HYGROTON   dexamethasone 4 MG tablet Commonly known as:  DECADRON   OLANZapine 10 MG tablet Commonly known as:  ZYPREXA   ondansetron 8 MG tablet Commonly known as:  ZOFRAN     TAKE these medications   aspirin 81 MG chewable tablet Chew 1 tablet (81 mg total) by mouth daily. Start taking on:  01/16/2018   atorvastatin 80 MG tablet Commonly known as:  LIPITOR Take 1 tablet (80 mg total) by mouth daily at 6 PM.   cloNIDine 0.1 MG tablet Commonly known as:  CATAPRES Take 1 tablet (0.1 mg total) by mouth daily. Start taking on:  01/16/2018 What changed:    medication strength  how much to take  when to take this   feeding supplement (ENSURE ENLIVE) Liqd Take 237 mLs by mouth 3 (three) times daily between meals.   metoprolol tartrate 25 MG tablet Commonly known as:  LOPRESSOR Take 1 tablet (25 mg total) by mouth 2 (two)  times daily.   pantoprazole sodium 40 mg/20 mL Pack Commonly known as:  PROTONIX Take 20 mLs (40 mg total) by mouth daily.   prochlorperazine 10 MG tablet Commonly known as:  COMPAZINE Take 10 mg by mouth every 6 (six) hours as needed for nausea/vomiting.   RESOURCE THICKENUP CLEAR Powd Use with every  Meal   ticagrelor 90 MG Tabs  tablet Commonly known as:  BRILINTA Take 1 tablet (90 mg total) by mouth 2 (two) times daily.       No Known Allergies  Consultations: Neurology  IR   Procedures/Studies: Ct Angio Head W Or Wo Contrast  Result Date: 01/03/2018 CLINICAL DATA:  Altered mental status. EXAM: CT ANGIOGRAPHY HEAD AND NECK TECHNIQUE: Multidetector CT imaging of the head and neck was performed using the standard protocol during bolus administration of intravenous contrast. Multiplanar CT image reconstructions and MIPs were obtained to evaluate the vascular anatomy. Carotid stenosis measurements (when applicable) are obtained utilizing NASCET criteria, using the distal internal carotid diameter as the denominator. CONTRAST:  141mL ISOVUE-370 IOPAMIDOL (ISOVUE-370) INJECTION 76% COMPARISON:  Noncontrast head CT earlier today FINDINGS: CTA NECK FINDINGS Aortic arch: Normal.  Three vessel branching. Right carotid system: Limited by motion but no stenosis, ulceration, or dissection. Left carotid system: Limited by motion but no stenosis, ulceration, or dissection. There is mild atherosclerotic plaque at the ICA bulb. Vertebral arteries: No proximal subclavian stenosis or atherosclerosis. Both vertebral arteries are motion degraded but where not distorted they are widely patent. Skeleton: Accounting for motion there is no acute finding Other neck: Nasal trumpet in place. Upper chest: Negative Review of the MIP images confirms the above findings CTA HEAD FINDINGS Anterior circulation: Limited by motion. No major branch occlusion flow limiting stenosis is seen. No gross aneurysm. Posterior circulation: Mild right vertebral artery dominance. Occluded basilar at the vertebrobasilar junction with reconstitution at the terminus. Venous sinuses: Hypoplastic on the right. The dural venous sinuses are patent. Anatomic variants: Negative for aneurysm. Delayed phase: Patient's acute infarcts are nonenhancing. Critical Value/emergent  results were called by telephone at the time of interpretation on 01/03/2018 at 1:20 pm to Dr. Charlesetta Shanks , who verbally acknowledged these results. Review of the MIP images confirms the above findings IMPRESSION: 1. 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 2. No embolic source seen in the neck, although limited by significant motion artifact. Electronically Signed   By: Monte Fantasia M.D.   On: 01/03/2018 13:27   Dg Chest 2 View  Result Date: 01/12/2018 CLINICAL DATA:  Patient admitted on 11/23 for nausea and confusion, on chemo. Patient was intubated at one point. Right lateral obtained. EXAM: CHEST - 2 VIEW COMPARISON:  01/07/2018 FINDINGS: Improved aeration.  No focal infiltrate or overt edema. Heart size and mediastinal contours are within normal limits. No effusion. Feeding tube extends at least as far as the stomach, tip not seen. Residual contrast is noted in bowel in the left upper quadrant. IMPRESSION: No acute cardiopulmonary disease. Electronically Signed   By: Lucrezia Europe M.D.   On: 01/12/2018 13:40   Ct Head Wo Contrast  Result Date: 01/03/2018 CLINICAL DATA:  Altered level of consciousness. Seizure-like activity EXAM: CT HEAD WITHOUT CONTRAST TECHNIQUE: Contiguous axial images were obtained from the base of the skull through the vertex without intravenous contrast. COMPARISON:  None. FINDINGS: Brain: Patchy low-density in the bilateral cerebellum and occipital lobes. No low-density seen in the anterior circulation distribution. No hemorrhage, hydrocephalus, or mass effect. Vascular: No hyperdense  vessel, including the basilar Skull: Negative Sinuses/Orbits: Negative Other: Critical Value/emergent results were called by telephone at the time of interpretation on 01/03/2018 at 12:43 pm to Dr. Jeannie Done PFEIFFER , who verbally acknowledged these results. IMPRESSION: Patchy low-density in the bilateral cerebellum and occipital lobes. This pattern suggests  posterior circulation acute infarcts-consider CTA. Given young age and recent chemotherapy underlying PRES is a consideration-although no symmetric cerebral edema. Metastatic disease with edema is possible but less likely given the distribution. Electronically Signed   By: Monte Fantasia M.D.   On: 01/03/2018 12:45   Ct Angio Neck W And/or Wo Contrast  Result Date: 01/03/2018 CLINICAL DATA:  Altered mental status. EXAM: CT ANGIOGRAPHY HEAD AND NECK TECHNIQUE: Multidetector CT imaging of the head and neck was performed using the standard protocol during bolus administration of intravenous contrast. Multiplanar CT image reconstructions and MIPs were obtained to evaluate the vascular anatomy. Carotid stenosis measurements (when applicable) are obtained utilizing NASCET criteria, using the distal internal carotid diameter as the denominator. CONTRAST:  192mL ISOVUE-370 IOPAMIDOL (ISOVUE-370) INJECTION 76% COMPARISON:  Noncontrast head CT earlier today FINDINGS: CTA NECK FINDINGS Aortic arch: Normal.  Three vessel branching. Right carotid system: Limited by motion but no stenosis, ulceration, or dissection. Left carotid system: Limited by motion but no stenosis, ulceration, or dissection. There is mild atherosclerotic plaque at the ICA bulb. Vertebral arteries: No proximal subclavian stenosis or atherosclerosis. Both vertebral arteries are motion degraded but where not distorted they are widely patent. Skeleton: Accounting for motion there is no acute finding Other neck: Nasal trumpet in place. Upper chest: Negative Review of the MIP images confirms the above findings CTA HEAD FINDINGS Anterior circulation: Limited by motion. No major branch occlusion flow limiting stenosis is seen. No gross aneurysm. Posterior circulation: Mild right vertebral artery dominance. Occluded basilar at the vertebrobasilar junction with reconstitution at the terminus. Venous sinuses: Hypoplastic on the right. The dural venous sinuses  are patent. Anatomic variants: Negative for aneurysm. Delayed phase: Patient's acute infarcts are nonenhancing. Critical Value/emergent results were called by telephone at the time of interpretation on 01/03/2018 at 1:20 pm to Dr. Charlesetta Shanks , who verbally acknowledged these results. Review of the MIP images confirms the above findings IMPRESSION: 1. 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 2. No embolic source seen in the neck, although limited by significant motion artifact. Electronically Signed   By: Monte Fantasia M.D.   On: 01/03/2018 13:27   Ct Angio Chest Pe W Or Wo Contrast  Result Date: 01/13/2018 CLINICAL DATA:  37 y/o M; unexplained tachycardia. History of testicular cancer receiving chemotherapy and brainstem stroke. EXAM: CT ANGIOGRAPHY CHEST WITH CONTRAST TECHNIQUE: Multidetector CT imaging of the chest was performed using the standard protocol during bolus administration of intravenous contrast. Multiplanar CT image reconstructions and MIPs were obtained to evaluate the vascular anatomy. CONTRAST:  190mL ISOVUE-370 IOPAMIDOL (ISOVUE-370) INJECTION 76% COMPARISON:  None. FINDINGS: Cardiovascular: Satisfactory opacification of the pulmonary arteries to the segmental level. No evidence of pulmonary embolism. Normal heart size. No pericardial effusion. Mediastinum/Nodes: No enlarged mediastinal, hilar, or axillary lymph nodes. Thyroid gland, trachea, and esophagus demonstrate no significant findings. Lungs/Pleura: Lungs are clear. No pleural effusion or pneumothorax. Small blebs in the lung apices. Upper Abdomen: No acute abnormality. Musculoskeletal: No chest wall abnormality. No acute or significant osseous findings. Review of the MIP images confirms the above findings. IMPRESSION: No pulmonary embolus identified.  Negative CTA of the chest. Electronically Signed   By: Kristine Garbe  M.D.   On: 01/13/2018 17:38   Mr Brain Wo Contrast  Result  Date: 01/03/2018 CLINICAL DATA:  Follow-up basilar artery thrombosis and stroke. History of testicular cancer. EXAM: MRI HEAD WITHOUT CONTRAST TECHNIQUE: Axial diffusion weighted imaging, axial T2 FLAIR and axial T2. COMPARISON:  CT HEAD and CT angiogram head and neck January 03, 2018 FINDINGS: Brain: Patchy to confluent reduced diffusion bilateral cerebellum, confluent reduced diffusion within the lower pons. Confluent LEFT greater than RIGHT reduced diffusion bilateral occipital lobes, patchy thalami and RIGHT splenium of corpus callosum reduced diffusion. Subcentimeter areas of reduced diffusion bilateral posterior temporal lobes. All areas of reduced diffusion demonstrate low ADC values with mild T2 hyperintense cytotoxic edema. Patent fourth ventricle, no hydrocephalus. No midline shift. No abnormal extra-axial fluid collections. Vascular: Intermediate signal vertebral arteries and basilar artery. Skull and upper cervical spine: Not tailored for evaluation, non suspicious. Sinuses/Orbits: Mild paranasal sinus mucosal thickening. RIGHT mastoid effusion. Other: None. IMPRESSION: 1. Acute numerous supra- and infratentorial posterior circulation nonhemorrhagic infarcts. 2. 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 3. Critical Value/emergent results text paged to Dr.ERIC [facility] via AMION secure system on 01/03/2018 at 3:50 pm, including interpreting physician's phone number. Electronically Signed   By: Elon Alas M.D.   On: 01/03/2018 16:01   Ir Intra Cran Stent  Result Date: 01/06/2018 INDICATION: Progressive somnolence with seizure-like activity. 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 MRI of the brain reveals multifocal diffusion weighted abnormalities involving the cerebral hemispheres, the thalami, and the pons. EXAM: 1. EMERGENT LARGE VESSEL OCCLUSION THROMBOLYSIS (POSTERIOR CIRCULATION) COMPARISON:  CT angiogram of the head and neck of 01/03/2018, and  MRI of the brain 01/03/2018. MEDICATIONS: Patient received vancomycin IV prior to the procedure. ANESTHESIA/SEDATION: General anesthesia CONTRAST:  Isovue 300 approximately 100 cc. FLUOROSCOPY TIME:  Fluoroscopy Time: 44 minutes 0 seconds (3201 mGy). COMPLICATIONS: None immediate. TECHNIQUE: Following a full explanation of the procedure along with the potential associated complications, an informed witnessed consent was obtained spouse and family. The risks of intracranial hemorrhage of 10%, worsening neurological deficit, ventilator dependency, death and inability to revascularize were all reviewed in detail with the patient's spouse and family. The patient was then put under general anesthesia by the Department of Anesthesiology at [facility]. The right groin was prepped and draped in the usual sterile fashion. Thereafter using modified Seldinger technique, transfemoral access into the right common femoral artery was obtained without difficulty. Over a 0.035 inch guidewire a 5 French Pinnacle sheath was inserted. Through this, and also over a 0.035 inch guidewire a 5 [place] JB 1 catheter was advanced to the aortic arch region and selectively positioned in the right common carotid artery, the right vertebral artery, the left vertebral artery, and the left common carotid artery. FINDINGS: The right common carotid arteriogram demonstrates the right external carotid artery and its major branches to be widely patent. The right internal carotid artery at the bulb to the cranial skull base opacifies normally. The petrous, cavernous and supraclinoid segments are widely patent. The right middle cerebral artery and the right anterior cerebral artery opacify into the capillary and venous phases. There is a small right posterior communicating artery which retrogradely opacifies the distal basilar artery and subsequently the right posterior cerebral artery transiently. Right vertebral artery origin is widely patent.  The vessel is seen to opacify to the cranial skull base. Wide patency is seen of the right vertebrobasilar junction to the level of the right posterior-inferior cerebellar artery. Distal to this there  is slow ascent of contrast to the distal right vertebrobasilar junction. There nonvisualization of the basilar artery. The left vertebral artery origin is widely patent. The vessel is seen to opacify to the cranial skull base. The vessel terminates in the left posterior inferior cerebellar artery. No angiographic opacification is noted distal to the origin of the left posterior-inferior cerebellar artery. The left common carotid arteriogram demonstrates the left external carotid artery and its major branches to be widely patent. The left internal carotid artery at the bulb to the cranial skull base demonstrates normal opacification. The petrous, cavernous, and supraclinoid segments are widely patent. The left middle cerebral artery and the left anterior cerebral artery opacify into the capillary and venous phases. PROCEDURE: ENDOVASCULAR COMPLETE REVASCULARIZATION OF THE OCCLUDED BASILAR ARTERY. The diagnostic JB 1 catheter in the distal right subclavian artery was exchanged over a 0.035 inch 300 cm Rosen exchange guidewire for an 8 [place] Neuron Max guide sheath using biplane roadmap technique and constant fluoroscopic guidance. Good aspiration obtained from the hub of the Neuron Max sheath. Gentle contrast injection through the sheath demonstrated no evidence of spasm or of intraluminal filling defects of the right subclavian artery. The guidewire was removed. Through the Neuron Max guide sheath, over a 0.035 inch Roadrunner guidewire, a 115 cm Catalyst guide catheter was advanced to just distal to the origin of the Neuron Max sheath. This had been retrieved just proximal to the origin of the right vertebral artery. The 0.035 inch Roadrunner guidewire was then gently manipulated into the right vertebral artery  followed by the Catalyst guide catheter to the distal right vertebral artery. At this time, the Neuron Max sheath was also advanced into the proximal 1/3 of the right vertebral artery. The guidewire was removed. Good aspiration obtained from the hub of the 5 [place] Catalyst guide catheter. 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 Over a 0.014 inch Softip Synchro micro guidewire, an 021 Trevo ProVue 115 cm microcatheter was advanced to just distal to the origin of the 5 [place] Catalyst guide catheter. With the micro guidewire leading with a J-tip configuration, the combination was navigated without difficulty to the right vertebrobasilar junction proximal to the right posterior-inferior cerebellar artery. Using biplane roadmap technique and constant fluoroscopic guidance, the J-tip 0.014 inch micro guidewire was then gently manipulated using a torque device through the occluded right vertebrobasilar junction and the occluded basilar artery. Access was obtained into the right superior cerebellar artery followed by the microcatheter. The guidewire was removed. Good aspiration obtained from the 021 microcatheter. A gentle contrast injection demonstrated free antegrade flow distally. Microcatheter was then connected to continuous heparinized saline infusion. At this time, a 4 mm x 40 mm Solitaire X retrieval device was advanced to the distal end of the microcatheter. The O ring on the delivery microcatheter was loosened. With slight forward gentle traction with the right hand on the delivery micro guidewire, with the left hand the delivery microcatheter was retrieved unsheathing the distal and then the proximal portion of the retrieval device. A gentle control arteriogram performed through the 5 [place] Catalyst guide catheter demonstrated no  angiographic recanalization of the occluded basilar artery. The Neuron Max guide sheath was advanced more distally in the right vertebral artery. With constant aspiration being applied at the hub of the Neuron Max guide sheath, and also using a Penumbra vacuum aspiration device at the hub  of the 5 [place] Catalyst guide catheter, the combination of the retrieval device, the microcatheter and the 5 [place] Catalyst guide catheter were retrieved and removed as aspiration was continued at the hub of the [employer] guide sheath. Good aspiration was continued at the Neuron Max sheath. Free back bleed was noted at the hub of the Neuron Max sheath. A gentle control arteriogram performed through the Neuron Max sheath demonstrated partially recannulization of the occluded basilar artery with now opacification of the posterior cerebral arteries, the superior cerebellar arteries and the anterior-inferior cerebellar arteries proximally. 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 A subsequent arteriogram performed approximately 2 minutes after the initial, demonstrated progressively slow flow in the recanalized basilar artery with severe irregular filling defect in the right vertebrobasilar junction distal to the origin of the right posterior-inferior cerebellar artery. Also there was now poor visualization of the posterior cerebral arteries and also the superior cerebellar arteries. A combination of the Trevo ProVue microcatheter inside of a 5 [place] Catalyst guide catheter was then advanced over a 0.014 inch Softip Synchro micro guidewire to the distal end of the Catalyst guide catheter. With the micro guidewire leading with a J-tip configuration, using biplane roadmap technique and constant fluoroscopic guidance, the micro guidewire was then again gently manipulated through the large filling defect with high-grade stenosis in the proximal basilar artery to the distal basilar artery and the  left posterior cerebral artery followed by the microcatheter. The guidewire was removed. Good aspiration obtained from the hub of the catheter. A gentle control arteriogram performed through the microcatheter demonstrated safe position of tip of the microcatheter. This was then connected to continuous heparinized saline infusion. At this time a 5 mm x 33 mm Embotrap retrieval device was advanced to the distal end of microcatheter. The proximal and the distal landing zones were then defined. Thereafter the O ring on the delivery microcatheter was loosened. With slight forward gentle traction with the right hand on the delivery micro guidewire with the left hand the delivery microcatheter was retrieved unsheathing the retrieval device. Arteriograms performed through the 5 [place] Catalyst guide catheter in the right vertebrobasilar junction demonstrated improved visualization of the distal basilar artery, the posterior cerebral arteries, the superior cerebellar arteries. The anterior inferior cerebellar arteries were poorly visualized. With constant aspiration being applied at the hub of the 5 [place] guide catheter with a Penumbra vacuum aspiration device, and a 60 mL syringe at the hub of the Neuron Max guide sheath, the combination of the retrieval device, the microcatheter and the 5 [place] Catalyst guide catheter were retrieved and removed. At this time, there was clot noted entangled in the cells of the retrieval device. The aspirate also contained clot. A control arteriogram performed through the Neuron Max sheath after having established free back bleed demonstrated complete revascularization of the occluded basilar artery at the right vertebrobasilar junction. A revascularization was obtained. There was now free flow of blood in the posterior cerebral arteries, the superior cerebellar arteries. Faint opacification was noted of the right anterior inferior cerebellar artery and also in the left  anterior-inferior cerebellar artery. However, these 2 latter appeared occluded as did the right superior cerebral artery and its middle 1/3. There continued be a narrowing noted at the mid basilar artery level. This was monitored with control arteriograms performed at 5 and 15 minutes post second retrieval pass. These subsequent arteriograms continued to demonstrate progressive circumferential narrowing of the mid basilar artery  with slow flow. At this time, in a coaxial manner and with constant heparinized saline infusion using biplane roadmap technique and constant fluoroscopic guidance, a combination of the 5 [place] Catalyst 115 cm guide catheter inside of which was a 021 Trevo ProVue microcatheter was advanced over a 0.014 inch Softip Synchro micro guidewire to the distal end of the 8 [place] Neuron Max sheath. With the micro guidewire leading with a J-tip configuration, the combination was navigated to the right vertebrobasilar junction. 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 These were advanced to the left posterior cerebral artery P1 segment. The guidewire was removed. Good aspiration obtained from the hub of the microcatheter. A gentle contrast injection demonstrated safe position of tip of the microcatheter. This was then connected to continuous heparinized saline infusion. The 5 [place] Catalyst guide catheter was advanced further cranially into the right vertebrobasilar junction just distal to the origin of the right posterior-inferior cerebellar artery. Measurements then performed of the distal and proximal landing zone of the basilar artery. It was decided to use a 4 mm x 30 mm Codman Enterprise vascular reconstruction device. This was then advanced using biplane roadmap technique and constant fluoroscopic guidance in a coaxial manner and with constant heparinized saline infusion to the  distal end of the microcatheter. The O ring on the delivery microcatheter was then loosened. The entire microcatheter with the stent was retrieved more proximally. The proximal and landing zones were then reached. Thereafter, with slight forward gentle traction with the right hand on the delivery micro guidewire, with the left hand the delivery microcatheter were retrieved unsheathing the distal, and then the proximal stent. The delivery micro guidewire was then gently retrieved and removed as was the delivery microcatheter. A control arteriogram performed through the 5 [place] Catalyst guide catheter in the left vertebrobasilar junction demonstrated excellent apposition of flow through the stented segment of the basilar artery. There appeared to be complete revascularization of the posterior circulation except for the right anterior-inferior cerebellar artery which was only opacified in its proximal 1/3. Repeat arteriograms were then performed at 10 and 20 minutes post stent placement. These demonstrated intraluminal filling defects at the waist of the mid basilar artery. This necessitated use of 4.5 mg of super selective intracranial intra-arterial Integrilin given over a few minutes. Subsequent arteriograms performed demonstrated progressive clearing and improved caliber at the site of the previous high-grade stenosis. A final control arteriogram performed through the Catalyst guide catheter in the right vertebrobasilar junction demonstrated significant improved caliber and flow through the stented segment of the basilar artery. There was now opacification of the posterior cerebral arteries, the superior cerebellar arteries, the left anterior-inferior cerebellar artery and partially the right anterior-inferior cerebellar artery. Retrograde flow was noted into the left vertebrobasilar junction to the left posterior-inferior cerebellar artery. Throughout the procedure, the patient's blood pressure and neurological  status remained stable. No angiographic evidence of extravasation or mass-effect or midline shift was noted. Prior to the positioning of the stent in the basilar artery, the patient was loaded with 180 mg of Brilinta, and 81 mg of aspirin via an orogastric tube. The stent was positioned approximately 20 minutes post delivery of these medications. The 5 [place] Catalyst guide catheter and the 8 [place] Neuron Max guide sheath were then retrieved into the abdominal aorta and exchanged over a J-tip guidewire for a 6 [place] Pinnacle sheath. This in turn was removed with  the successful application of a 6 [place] ExoSeal closure device in the right common femoral puncture site. The right groin appeared soft without evidence of hematoma or bleeding. Distal pulses remained Dopplerable in the dorsalis pedis, and posterior tibial arteries bilaterally unchanged from prior to the procedure. The patient was then transferred to the neuro ICU intubated for further post endovascular treatment and management. A Dyna CT of the brain performed demonstrated no gross evidence of intracranial hemorrhages, mass effect or midline shift. No evidence of hydrocephalus or of mass-effect was noted in the posterior fossa. 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 PLAN: Patient transferred to neuro ICU to continue with post revascularization management. Electronically Signed   By: Luanne Bras M.D.   On: 01/05/2018 13:31   [place]  Result Date: 01/06/2018 INDICATION: Progressive  somnolence with seizure-like activity. 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 MRI of the brain reveals multifocal diffusion weighted abnormalities involving the cerebral hemispheres, the thalami, and the pons. EXAM: 1. EMERGENT LARGE VESSEL OCCLUSION THROMBOLYSIS (POSTERIOR CIRCULATION) COMPARISON:  CT angiogram of the head and neck of 01/03/2018, and MRI of the brain 01/03/2018. MEDICATIONS: Patient received vancomycin IV prior to the procedure. ANESTHESIA/SEDATION: General anesthesia CONTRAST:  Isovue 300 approximately 100 cc. FLUOROSCOPY TIME:  Fluoroscopy Time: 44 minutes 0 seconds (3201 mGy). COMPLICATIONS: None immediate. TECHNIQUE: Following a full explanation of the procedure along with the potential associated complications, an informed witnessed consent was obtained spouse and family. The risks of intracranial hemorrhage of 10%, worsening neurological deficit, ventilator dependency, death and inability to revascularize were all reviewed in detail with the patient's spouse and family. The patient was then put under general anesthesia by the Department of Anesthesiology at [facility]. The right groin was prepped and draped in the usual sterile fashion. Thereafter using modified Seldinger technique, transfemoral access into the right common femoral artery was obtained without difficulty. Over a 0.035 inch guidewire a 5 [place] Pinnacle sheath was inserted. Through this, and also over a 0.035 inch guidewire a 5 [place] JB 1 catheter was advanced to the aortic arch region and selectively positioned in the right common carotid artery, the right vertebral artery, the left vertebral artery, and the left common carotid artery. FINDINGS: The right common carotid arteriogram demonstrates the right external carotid artery and its major branches to be widely patent. The right internal carotid artery at the bulb to the cranial skull base opacifies normally. The petrous, cavernous and  supraclinoid segments are widely patent. The right middle cerebral artery and the right anterior cerebral artery opacify into the capillary and venous phases. There is a small right posterior communicating artery which retrogradely opacifies the distal basilar artery and subsequently the right posterior cerebral artery transiently. Right vertebral artery origin is widely patent. The vessel is seen to opacify to the cranial skull base. Wide patency is seen of the right vertebrobasilar junction to the level of the right posterior-inferior cerebellar artery. Distal to this there is slow ascent of contrast to the distal right vertebrobasilar junction. There nonvisualization of the basilar artery. The left vertebral  artery origin is widely patent. The vessel is seen to opacify to the cranial skull base. The vessel terminates in the left posterior inferior cerebellar artery. No angiographic opacification is noted distal to the origin of the left posterior-inferior cerebellar artery. The left common carotid arteriogram demonstrates the left external carotid artery and its major branches to be widely patent. The left internal carotid artery at the bulb to the cranial skull base demonstrates normal opacification. The petrous, cavernous, and supraclinoid segments are widely patent. The left middle cerebral artery and the left anterior cerebral artery opacify into the capillary and venous phases. PROCEDURE: ENDOVASCULAR COMPLETE REVASCULARIZATION OF THE OCCLUDED BASILAR ARTERY. The diagnostic JB 1 catheter in the distal right subclavian artery was exchanged over a 0.035 inch 300 cm Rosen exchange guidewire for an 8 [place] Neuron Max guide sheath using biplane roadmap technique and constant fluoroscopic guidance. Good aspiration obtained from the hub of the Neuron Max sheath. Gentle contrast injection through the sheath demonstrated no evidence of spasm or of intraluminal filling defects of the right subclavian artery. The  guidewire was removed. Through the Neuron Max guide sheath, over a 0.035 inch Roadrunner guidewire, a 115 cm Catalyst guide catheter was advanced to just distal to the origin of the Neuron Max sheath. This had been retrieved just proximal to the origin of the right vertebral artery. The 0.035 inch Roadrunner guidewire was then gently manipulated into the right vertebral artery followed by the Catalyst guide catheter to the distal right vertebral artery. At this time, the Neuron Max sheath was also advanced into the proximal 1/3 of the right vertebral artery. The guidewire was removed. Good aspiration obtained from the hub of the 5 [place] Catalyst guide catheter. 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 Over a 0.014 inch Softip Synchro micro guidewire, an 021 Trevo ProVue 115 cm microcatheter was advanced to just distal to the origin of the 5 [place] Catalyst guide catheter. With the micro guidewire leading with a J-tip configuration, the combination was navigated without difficulty to the right vertebrobasilar junction proximal to the right posterior-inferior cerebellar artery. Using biplane roadmap technique and constant fluoroscopic guidance, the J-tip 0.014 inch micro guidewire was then gently manipulated using a torque device through the occluded right vertebrobasilar junction and the occluded basilar artery. Access was obtained into the right superior cerebellar artery followed by the microcatheter. The guidewire was removed. Good aspiration obtained from the 021 microcatheter. A gentle contrast injection demonstrated free antegrade flow distally. Microcatheter was then connected to continuous heparinized saline infusion. At this time, a 4 mm x 40 mm Solitaire X retrieval device was advanced to the distal end of the microcatheter. The O  ring on the delivery microcatheter was loosened. With slight forward gentle traction with the right hand on the delivery micro guidewire, with the left hand the delivery microcatheter was retrieved unsheathing the distal and then the proximal portion of the retrieval device. A gentle control arteriogram performed through the 5 [place] Catalyst guide catheter demonstrated no angiographic recanalization of the occluded basilar artery. The Neuron Max guide sheath was advanced more distally in the right vertebral artery. With constant aspiration being applied at the hub of the Neuron Max guide sheath, and also using a Penumbra vacuum aspiration device at the hub of the 5 [place] Catalyst guide catheter, the combination of the retrieval device, the microcatheter and the [place]  guide catheter were retrieved and removed as aspiration was continued at the hub of the Neuron Max guide sheath. Good aspiration was continued at the Neuron Max sheath. Free back bleed was noted at the hub of the Neuron Max sheath. A gentle control arteriogram performed through the Neuron Max sheath demonstrated partially recannulization of the occluded basilar artery with now opacification of the posterior cerebral arteries, the superior cerebellar arteries and the anterior-inferior cerebellar arteries proximally. 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 A subsequent arteriogram performed approximately 2 minutes after the initial, demonstrated progressively slow flow in the recanalized basilar artery with severe irregular filling defect in the right vertebrobasilar junction distal to the origin of the right posterior-inferior cerebellar artery. Also there was now poor visualization of the posterior cerebral arteries and also the superior cerebellar arteries. A combination of the Trevo ProVue microcatheter inside of a 5 [place] Catalyst guide catheter was then advanced over a 0.014 inch Softip Synchro  micro guidewire to the distal end of the Catalyst guide catheter. With the micro guidewire leading with a J-tip configuration, using biplane roadmap technique and constant fluoroscopic guidance, the micro guidewire was then again gently manipulated through the large filling defect with high-grade stenosis in the proximal basilar artery to the distal basilar artery and the left posterior cerebral artery followed by the microcatheter. The guidewire was removed. Good aspiration obtained from the hub of the catheter. A gentle control arteriogram performed through the microcatheter demonstrated safe position of tip of the microcatheter. This was then connected to continuous heparinized saline infusion. At this time a 5 mm x 33 mm Embotrap retrieval device was advanced to the distal end of microcatheter. The proximal and the distal landing zones were then defined. Thereafter the O ring on the delivery microcatheter was loosened. With slight forward gentle traction with the right hand on the delivery micro guidewire with the left hand the delivery microcatheter was retrieved unsheathing the retrieval device. Arteriograms performed through the 5 [place] Catalyst guide catheter in the right vertebrobasilar junction demonstrated improved visualization of the distal basilar artery, the posterior cerebral arteries, the superior cerebellar arteries. The anterior inferior cerebellar arteries were poorly visualized. With constant aspiration being applied at the hub of the 5 [place] guide catheter with a Penumbra vacuum aspiration device, and a 60 mL syringe at the hub of the Neuron Max guide sheath, the combination of the retrieval device, the microcatheter and the 5 [place] Catalyst guide catheter were retrieved and removed. At this time, there was clot noted entangled in the cells of the retrieval device. The aspirate also contained clot. A control arteriogram performed through the Neuron Max sheath after having  established free back bleed demonstrated complete revascularization of the occluded basilar artery at the right vertebrobasilar junction. A revascularization was obtained. There was now free flow of blood in the posterior cerebral arteries, the superior cerebellar arteries. Faint opacification was noted of the right anterior inferior cerebellar artery and also in the left anterior-inferior cerebellar artery. However, these 2 latter appeared occluded as did the right superior cerebral artery and its middle 1/3. There continued be a narrowing noted at the mid basilar artery level. This was monitored with control arteriograms performed at 5 and 15 minutes post second retrieval pass. These subsequent arteriograms continued to demonstrate progressive circumferential narrowing of the mid basilar artery with slow flow. At this time, in a coaxial manner and with constant heparinized saline infusion using biplane roadmap technique  and constant fluoroscopic guidance, a combination of the 5 [place] Catalyst 115 cm guide catheter inside of which was a 021 Trevo ProVue microcatheter was advanced over a 0.014 inch Softip Synchro micro guidewire to the distal end of the 8 [place] Neuron Max sheath. With the micro guidewire leading with a J-tip configuration, the combination was navigated to the right vertebrobasilar junction. 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 These were advanced to the left posterior cerebral artery P1 segment. The guidewire was removed. Good aspiration obtained from the hub of the microcatheter. A gentle contrast injection demonstrated safe position of tip of the microcatheter. This was then connected to continuous heparinized saline infusion. The 5 [place] Catalyst guide catheter was advanced further cranially into the right vertebrobasilar junction just distal to the origin of the right  posterior-inferior cerebellar artery. Measurements then performed of the distal and proximal landing zone of the basilar artery. It was decided to use a 4 mm x 30 mm Codman Enterprise vascular reconstruction device. This was then advanced using biplane roadmap technique and constant fluoroscopic guidance in a coaxial manner and with constant heparinized saline infusion to the distal end of the microcatheter. The O ring on the delivery microcatheter was then loosened. The entire microcatheter with the stent was retrieved more proximally. The proximal and landing zones were then reached. Thereafter, with slight forward gentle traction with the right hand on the delivery micro guidewire, with the left hand the delivery microcatheter were retrieved unsheathing the distal, and then the proximal stent. The delivery micro guidewire was then gently retrieved and removed as was the delivery microcatheter. A control arteriogram performed through the 5 [place] Catalyst guide catheter in the left vertebrobasilar junction demonstrated excellent apposition of flow through the stented segment of the basilar artery. There appeared to be complete revascularization of the posterior circulation except for the right anterior-inferior cerebellar artery which was only opacified in its proximal 1/3. Repeat arteriograms were then performed at 10 and 20 minutes post stent placement. These demonstrated intraluminal filling defects at the waist of the mid basilar artery. This necessitated use of 4.5 mg of super selective intracranial intra-arterial Integrilin given over a few minutes. Subsequent arteriograms performed demonstrated progressive clearing and improved caliber at the site of the previous high-grade stenosis. A final control arteriogram performed through the Catalyst guide catheter in the right vertebrobasilar junction demonstrated significant improved caliber and flow through the stented segment of the basilar artery. There was now  opacification of the posterior cerebral arteries, the superior cerebellar arteries, the left anterior-inferior cerebellar artery and partially the right anterior-inferior cerebellar artery. Retrograde flow was noted into the left vertebrobasilar junction to the left posterior-inferior cerebellar artery. Throughout the procedure, the patient's blood pressure and neurological status remained stable. No angiographic evidence of extravasation or mass-effect or midline shift was noted. Prior to the positioning of the stent in the basilar artery, the patient was loaded with 180 mg of Brilinta, and 81 mg of aspirin via an orogastric tube. The stent was positioned approximately 20 minutes post delivery of these medications. The 5 [place] Catalyst guide catheter and the 8 [place] Neuron Max guide sheath were then retrieved into the abdominal aorta and exchanged over a J-tip guidewire for a 6 [place] Pinnacle sheath. This in turn was removed with the successful application of a 6 [place] ExoSeal closure device in the right common femoral puncture site. The right groin  appeared soft without evidence of hematoma or bleeding. Distal pulses remained Dopplerable in the dorsalis pedis, and posterior tibial arteries bilaterally unchanged from prior to the procedure. The patient was then transferred to the neuro ICU intubated for further post endovascular treatment and management. A Dyna CT of the brain performed demonstrated no gross evidence of intracranial hemorrhages, mass effect or midline shift. No evidence of hydrocephalus or of mass-effect was noted in the posterior fossa. IMPRESSION: Status post endovascular complete revascularization of occluded basilar artery extending from the right vertebrobasilar junction to the basilar artery to the level of the superior cerebellar arteries, with 1 pass with the 4 mm x 40 mm Solitaire X retrieval device, and 1 pass with the 5 mm x 33 mm Embotrap retrieval device achieving a TICI 2b  revascularization. 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 PLAN: Patient transferred to neuro ICU to continue with post revascularization management. Electronically Signed   By: Luanne Bras M.D.   On: 01/05/2018 13:31   Dg Chest Port 1 View  Result Date: 01/07/2018 CLINICAL DATA:  Respiratory failure EXAM: PORTABLE CHEST 1 VIEW COMPARISON:  01/03/2018 FINDINGS: Cardiac shadow is stable. Endotracheal tube and nasogastric catheter have been removed in the interval. The lungs are hypoinflated with very minimal atelectatic changes in the left base. No sizable effusion or infiltrate is noted. No bony abnormality is seen. A vial is noted over the left upper quadrant consistent with extrinsic artifact. IMPRESSION: Minimal left basilar atelectasis related to poor inspiratory effort. Electronically Signed   By: Inez Catalina M.D.   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 Basilar stent placement. EXAM: PORTABLE CHEST 1 VIEW COMPARISON:  01/03/2018 FINDINGS: The endotracheal tube is satisfactorily position with tip 4.5 cm above the carina. Nasogastric tube enters the stomach with side port in the stomach body. The lungs appear clear. Heart size within normal limits. Mediastinal contours normal. IMPRESSION: 1. Endotracheal and nasogastric tubes appear satisfactorily position. The lungs appear clear. Electronically Signed   By: Van Clines M.D.   On: 01/03/2018 19:35   Dg Chest Portable 1 View  Result Date: 01/03/2018 CLINICAL DATA:  Intubation, history testicular cancer, smoker, hypertension EXAM: PORTABLE CHEST 1 VIEW COMPARISON:  Portable exam 1354 hours compared to 1140 hours FINDINGS: Endotracheal tube with tip projecting 19 mm above carina. Nasogastric tube extends into  stomach. Normal heart size, mediastinal contours, and pulmonary vascularity. Tips of lung apices excluded. Visualized lungs clear. No infiltrate, pleural effusion or gross pneumothorax. IMPRESSION: Tube positions as above. No acute abnormalities. Electronically Signed   By: Lavonia Dana M.D.   On: 01/03/2018 14:27   Dg Chest Port 1 View  Result Date: 01/03/2018 CLINICAL DATA:  Nausea since first chemotherapy treatment this week, smoker, testicular cancer EXAM: PORTABLE CHEST 1 VIEW COMPARISON:  Portable exam 1140 hours compared to 08/07/2016 FINDINGS: Normal heart size, mediastinal contours, and pulmonary vascularity. Lungs clear. No pleural effusion or pneumothorax. Bones unremarkable. IMPRESSION: Normal exam. Electronically Signed   By: Lavonia Dana M.D.   On: 01/03/2018 11:58   Ir Percutaneous Art Thrombectomy/infusion Intracranial Inc Diag Angio  Result Date: 01/06/2018 INDICATION: Progressive somnolence with seizure-like activity. 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 MRI of the brain reveals multifocal diffusion weighted abnormalities involving the cerebral hemispheres, the thalami, and the pons. EXAM: 1. EMERGENT  LARGE VESSEL OCCLUSION THROMBOLYSIS (POSTERIOR CIRCULATION) COMPARISON:  CT angiogram of the head and neck of 01/03/2018, and MRI of the brain 01/03/2018. MEDICATIONS: Patient received vancomycin IV prior to the procedure. ANESTHESIA/SEDATION: General anesthesia CONTRAST:  Isovue 300 approximately 100 cc. FLUOROSCOPY TIME:  Fluoroscopy Time: 44 minutes 0 seconds (3201 mGy). COMPLICATIONS: None immediate. TECHNIQUE: Following a full explanation of the procedure along with the potential associated complications, an informed witnessed consent was obtained spouse and family. The risks of intracranial hemorrhage of 10%, worsening neurological deficit, ventilator dependency, death and inability to revascularize were all reviewed in detail with the patient's spouse and  family. The patient was then put under general anesthesia by the Department of Anesthesiology at [facility]. The right groin was prepped and draped in the usual sterile fashion. Thereafter using modified Seldinger technique, transfemoral access into the right common femoral artery was obtained without difficulty. Over a 0.035 inch guidewire a 5 French Pinnacle sheath was inserted. Through this, and also over a 0.035 inch guidewire a 5 [place] JB 1 catheter was advanced to the aortic arch region and selectively positioned in the right common carotid artery, the right vertebral artery, the left vertebral artery, and the left common carotid artery. FINDINGS: The right common carotid arteriogram demonstrates the right external carotid artery and its major branches to be widely patent. The right internal carotid artery at the bulb to the cranial skull base opacifies normally. The petrous, cavernous and supraclinoid segments are widely patent. The right middle cerebral artery and the right anterior cerebral artery opacify into the capillary and venous phases. There is a small right posterior communicating artery which retrogradely opacifies the distal basilar artery and subsequently the right posterior cerebral artery transiently. Right vertebral artery origin is widely patent. The vessel is seen to opacify to the cranial skull base. Wide patency is seen of the right vertebrobasilar junction to the level of the right posterior-inferior cerebellar artery. Distal to this there is slow ascent of contrast to the distal right vertebrobasilar junction. There nonvisualization of the basilar artery. The left vertebral artery origin is widely patent. The vessel is seen to opacify to the cranial skull base. The vessel terminates in the left posterior inferior cerebellar artery. No angiographic opacification is noted distal to the origin of the left posterior-inferior cerebellar artery. The left common carotid arteriogram  demonstrates the left external carotid artery and its major branches to be widely patent. The left internal carotid artery at the bulb to the cranial skull base demonstrates normal opacification. The petrous, cavernous, and supraclinoid segments are widely patent. The left middle cerebral artery and the left anterior cerebral artery opacify into the capillary and venous phases. PROCEDURE: ENDOVASCULAR COMPLETE REVASCULARIZATION OF THE OCCLUDED BASILAR ARTERY. The diagnostic JB 1 catheter in the distal right subclavian artery was exchanged over a 0.035 inch 300 cm Rosen exchange guidewire for an 8 [place] Neuron Max guide sheath using biplane roadmap technique and constant fluoroscopic guidance. Good aspiration obtained from the hub of the Neuron Max sheath. Gentle contrast injection through the sheath demonstrated no evidence of spasm or of intraluminal filling defects of the right subclavian artery. The guidewire was removed. Through the Neuron Max guide sheath, over a 0.035 inch Roadrunner guidewire, a 115 cm Catalyst guide catheter was advanced to just distal to the origin of the Neuron Max sheath. This had been retrieved just proximal to the origin of the right vertebral artery. The 0.035 inch Roadrunner guidewire was then gently manipulated into the right  vertebral artery followed by the Catalyst guide catheter to the distal right vertebral artery. At this time, the Neuron Max sheath was also advanced into the proximal 1/3 of the right vertebral artery. The guidewire was removed. Good aspiration obtained from the hub of the 5 [place] Catalyst guide catheter. 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 Over a 0.014 inch Softip Synchro micro guidewire, an 021 Trevo ProVue 115 cm microcatheter was advanced to just distal to the origin of the 5  [place] Catalyst guide catheter. With the micro guidewire leading with a J-tip configuration, the combination was navigated without difficulty to the right vertebrobasilar junction proximal to the right posterior-inferior cerebellar artery. Using biplane roadmap technique and constant fluoroscopic guidance, the J-tip 0.014 inch micro guidewire was then gently manipulated using a torque device through the occluded right vertebrobasilar junction and the occluded basilar artery. Access was obtained into the right superior cerebellar artery followed by the microcatheter. The guidewire was removed. Good aspiration obtained from the 021 microcatheter. A gentle contrast injection demonstrated free antegrade flow distally. Microcatheter was then connected to continuous heparinized saline infusion. At this time, a 4 mm x 40 mm Solitaire X retrieval device was advanced to the distal end of the microcatheter. The O ring on the delivery microcatheter was loosened. With slight forward gentle traction with the right hand on the delivery micro guidewire, with the left hand the delivery microcatheter was retrieved unsheathing the distal and then the proximal portion of the retrieval device. A gentle control arteriogram performed through the 5 [place] Catalyst guide catheter demonstrated no angiographic recanalization of the occluded basilar artery. The Neuron Max guide sheath was advanced more distally in the right vertebral artery. With constant aspiration being applied at the hub of the Neuron Max guide sheath, and also using a Penumbra vacuum aspiration device at the hub of the 5 [place] Catalyst guide catheter, the combination of the retrieval device, the microcatheter and the 5 [place] Catalyst guide catheter were retrieved and removed as aspiration was continued at the hub of the [employer] guide sheath. Good aspiration was continued at the Neuron Max sheath. Free back bleed was noted at the hub of the Neuron Max sheath. A gentle  control arteriogram performed through the Neuron Max sheath demonstrated partially recannulization of the occluded basilar artery with now opacification of the posterior cerebral arteries, the superior cerebellar arteries and the anterior-inferior cerebellar arteries proximally. 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 A subsequent arteriogram performed approximately 2 minutes after the initial, demonstrated progressively slow flow in the recanalized basilar artery with severe irregular filling defect in the right vertebrobasilar junction distal to the origin of the right posterior-inferior cerebellar artery. Also there was now poor visualization of the posterior cerebral arteries and also the superior cerebellar arteries. A combination of the Trevo ProVue microcatheter inside of a 5 [place] Catalyst guide catheter was then advanced over a 0.014 inch Softip Synchro micro guidewire to the distal end of the Catalyst guide catheter. With the micro guidewire leading with a J-tip configuration, using biplane roadmap technique and constant fluoroscopic guidance, the micro guidewire was then again gently manipulated through the large filling defect with high-grade stenosis in the proximal basilar artery to the distal basilar artery and the left posterior cerebral artery followed by the microcatheter. The guidewire was removed. Good aspiration obtained from  the hub of the catheter. A gentle control arteriogram performed through the microcatheter demonstrated safe position of tip of the microcatheter. This was then connected to continuous heparinized saline infusion. At this time a 5 mm x 33 mm Embotrap retrieval device was advanced to the distal end of microcatheter. The proximal and the distal landing zones were then defined. Thereafter the O ring on the delivery microcatheter was loosened. With slight forward gentle traction with the right hand on the delivery micro guidewire with the  left hand the delivery microcatheter was retrieved unsheathing the retrieval device. Arteriograms performed through the 5 [place] Catalyst guide catheter in the right vertebrobasilar junction demonstrated improved visualization of the distal basilar artery, the posterior cerebral arteries, the superior cerebellar arteries. The anterior inferior cerebellar arteries were poorly visualized. With constant aspiration being applied at the hub of the 5 [place] guide catheter with a Penumbra vacuum aspiration device, and a 60 mL syringe at the hub of the Neuron Max guide sheath, the combination of the retrieval device, the microcatheter and the 5 [place] Catalyst guide catheter were retrieved and removed. At this time, there was clot noted entangled in the cells of the retrieval device. The aspirate also contained clot. A control arteriogram performed through the Neuron Max sheath after having established free back bleed demonstrated complete revascularization of the occluded basilar artery at the right vertebrobasilar junction. A revascularization was obtained. There was now free flow of blood in the posterior cerebral arteries, the superior cerebellar arteries. Faint opacification was noted of the right anterior inferior cerebellar artery and also in the left anterior-inferior cerebellar artery. However, these 2 latter appeared occluded as did the right superior cerebral artery and its middle 1/3. There continued be a narrowing noted at the mid basilar artery level. This was monitored with control arteriograms performed at 5 and 15 minutes post second retrieval pass. These subsequent arteriograms continued to demonstrate progressive circumferential narrowing of the mid basilar artery with slow flow. At this time, in a coaxial manner and with constant heparinized saline infusion using biplane roadmap technique and constant fluoroscopic guidance, a combination of the 5 [place] Catalyst 115 cm guide catheter inside of  which was a 021 Trevo ProVue microcatheter was advanced over a 0.014 inch Softip Synchro micro guidewire to the distal end of the 8 [place] Neuron Max sheath. With the micro guidewire leading with a J-tip configuration, the combination was navigated to the right vertebrobasilar junction. 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 These were advanced to the left posterior cerebral artery P1 segment. The guidewire was removed. Good aspiration obtained from the hub of the microcatheter. A gentle contrast injection demonstrated safe position of tip of the microcatheter. This was then connected to continuous heparinized saline infusion. The 5 [place] Catalyst guide catheter was advanced further cranially into the right vertebrobasilar junction just distal to the origin of the right posterior-inferior cerebellar artery. Measurements then performed of the distal and proximal landing zone of the basilar artery. It was decided to use a 4 mm x 30 mm [employer] vascular reconstruction device. This was then advanced using biplane roadmap technique and constant fluoroscopic guidance in a coaxial manner and with constant heparinized saline infusion to the distal end of the microcatheter. The O ring on the delivery microcatheter was then loosened. The entire microcatheter with the stent was retrieved more proximally. The proximal and landing zones were then  reached. Thereafter, with slight forward gentle traction with the right hand on the delivery micro guidewire, with the left hand the delivery microcatheter were retrieved unsheathing the distal, and then the proximal stent. The delivery micro guidewire was then gently retrieved and removed as was the delivery microcatheter. A control arteriogram performed through the 5 [place] Catalyst guide catheter in the left vertebrobasilar junction demonstrated excellent  apposition of flow through the stented segment of the basilar artery. There appeared to be complete revascularization of the posterior circulation except for the right anterior-inferior cerebellar artery which was only opacified in its proximal 1/3. Repeat arteriograms were then performed at 10 and 20 minutes post stent placement. These demonstrated intraluminal filling defects at the waist of the mid basilar artery. This necessitated use of 4.5 mg of super selective intracranial intra-arterial Integrilin given over a few minutes. Subsequent arteriograms performed demonstrated progressive clearing and improved caliber at the site of the previous high-grade stenosis. A final control arteriogram performed through the Catalyst guide catheter in the right vertebrobasilar junction demonstrated significant improved caliber and flow through the stented segment of the basilar artery. There was now opacification of the posterior cerebral arteries, the superior cerebellar arteries, the left anterior-inferior cerebellar artery and partially the right anterior-inferior cerebellar artery. Retrograde flow was noted into the left vertebrobasilar junction to the left posterior-inferior cerebellar artery. Throughout the procedure, the patient's blood pressure and neurological status remained stable. No angiographic evidence of extravasation or mass-effect or midline shift was noted. Prior to the positioning of the stent in the basilar artery, the patient was loaded with 180 mg of Brilinta, and 81 mg of aspirin via an orogastric tube. The stent was positioned approximately 20 minutes post delivery of these medications. The 5 [place] Catalyst guide catheter and the 8 [place] Neuron Max guide sheath were then retrieved into the abdominal aorta and exchanged over a J-tip guidewire for a 6 [place] Pinnacle sheath. This in turn was removed with the successful application of a 6 [place] ExoSeal closure device in the right common femoral  puncture site. The right groin appeared soft without evidence of hematoma or bleeding. Distal pulses remained Dopplerable in the dorsalis pedis, and posterior tibial arteries bilaterally unchanged from prior to the procedure. The patient was then transferred to the neuro ICU intubated for further post endovascular treatment and management. A Dyna CT of the brain performed demonstrated no gross evidence of intracranial hemorrhages, mass effect or midline shift. No evidence of hydrocephalus or of mass-effect was noted in the posterior fossa. 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 PLAN: Patient transferred to neuro ICU to continue with post revascularization management. Electronically Signed   By: Luanne Bras M.D.   On: 01/05/2018 13:31   Vas [place] Lower Extremity Venous (dvt)  Result Date: 01/05/2018  Lower Venous Study Indications: Stroke.  Performing Technologist: Sharion Dove RVS  Examination Guidelines: A complete evaluation includes B-mode imaging, spectral Doppler, color Doppler, and power Doppler as needed of all accessible portions of each vessel. Bilateral testing is considered an integral part of a complete examination. Limited examinations for reoccurring  indications may be performed as noted.  Right Venous Findings: +---------+---------------+---------+-----------+----------+-------+          CompressibilityPhasicitySpontaneityPropertiesSummary +---------+---------------+---------+-----------+----------+-------+ CFV      Full            Yes      Yes                          +---------+---------------+---------+-----------+----------+-------+ SFJ      Full                                                 +---------+---------------+---------+-----------+----------+-------+ FV Prox  Full                                                 +---------+---------------+---------+-----------+----------+-------+ FV Mid   Full                                                 +---------+---------------+---------+-----------+----------+-------+ FV DistalFull                                                 +---------+---------------+---------+-----------+----------+-------+ PFV      Full                                                 +---------+---------------+---------+-----------+----------+-------+ POP      Full           Yes      Yes                          +---------+---------------+---------+-----------+----------+-------+ PTV      Full                                                 +---------+---------------+---------+-----------+----------+-------+ PERO     Full                                                 +---------+---------------+---------+-----------+----------+-------+  Left Venous Findings: +---------+---------------+---------+-----------+----------+-------+          CompressibilityPhasicitySpontaneityPropertiesSummary +---------+---------------+---------+-----------+----------+-------+ CFV      Full           Yes      Yes                          +---------+---------------+---------+-----------+----------+-------+ SFJ      Full                                                 +---------+---------------+---------+-----------+----------+-------+  FV Prox  Full                                                 +---------+---------------+---------+-----------+----------+-------+ FV Mid   Full                                                  +---------+---------------+---------+-----------+----------+-------+ FV DistalFull                                                 +---------+---------------+---------+-----------+----------+-------+ PFV      Full                                                 +---------+---------------+---------+-----------+----------+-------+ POP      Full           Yes      Yes                          +---------+---------------+---------+-----------+----------+-------+ PTV      Full                                                 +---------+---------------+---------+-----------+----------+-------+ PERO     Full                                                 +---------+---------------+---------+-----------+----------+-------+    Summary: Right: There is no evidence of deep vein thrombosis in the lower extremity. Left: There is no evidence of deep vein thrombosis in the lower extremity.  *See table(s) above for measurements and observations. Electronically signed by Deitra Mayo MD on 01/05/2018 at 4:22:48 PM.    Final    Ir Angio Intra Extracran Sel Com Carotid Innominate Bilat Mod Sed  Result Date: 01/07/2018 INDICATION: Progressive somnolence with seizure-like activity. 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 MRI of the brain reveals multifocal diffusion weighted abnormalities involving the cerebral hemispheres, the thalami, and the pons.  EXAM: 1. EMERGENT LARGE VESSEL OCCLUSION THROMBOLYSIS (POSTERIOR CIRCULATION)  COMPARISON:  CT angiogram of the head and neck of 01/03/2018, and MRI of the brain 01/03/2018.  MEDICATIONS: Patient received vancomycin IV prior to the procedure.  ANESTHESIA/SEDATION: General anesthesia  CONTRAST:  Isovue 300 approximately 100 cc.  FLUOROSCOPY TIME:  Fluoroscopy Time: 44 minutes 0 seconds (3201 mGy).  COMPLICATIONS: None immediate.  TECHNIQUE: Following a full explanation of the procedure along with the potential  associated complications, an informed witnessed consent was obtained spouse and family. The risks of intracranial hemorrhage of 10%, worsening neurological deficit, ventilator dependency, death and inability to revascularize were all reviewed in detail with the patient's spouse and family.  The patient  was then put under general anesthesia by the Department of Anesthesiology at [facility]  The right groin was prepped and draped in the usual sterile fashion. Thereafter using modified Seldinger technique, transfemoral access into the right common femoral artery was obtained without difficulty. Over a 0.035 inch guidewire a 5 [place] Pinnacle sheath was inserted. Through this, and also over a 0.035 inch guidewire a 5 [place] JB 1 catheter was advanced to the aortic arch region and selectively positioned in the right common carotid artery, the right vertebral artery, the left vertebral artery, and the left common carotid artery.  FINDINGS: The right common carotid arteriogram demonstrates the right external carotid artery and its major branches to be widely patent.  The right internal carotid artery at the bulb to the cranial skull base opacifies normally.  The petrous, cavernous and supraclinoid segments are widely patent.  The right middle cerebral artery and the right anterior cerebral artery opacify into the capillary and venous phases.  There is a small right posterior communicating artery which retrogradely opacifies the distal basilar artery and subsequently the right posterior cerebral artery transiently.  Right vertebral artery origin is widely patent.  The vessel is seen to opacify to the cranial skull base. Wide patency is seen of the right vertebrobasilar junction to the level of the right posterior-inferior cerebellar artery. Distal to this there is slow ascent of contrast to the distal right vertebrobasilar junction. There nonvisualization of the basilar artery.  The left vertebral artery  origin is widely patent.  The vessel is seen to opacify to the cranial skull base. The vessel terminates in the left posterior inferior cerebellar artery. No angiographic opacification is noted distal to the origin of the left posterior-inferior cerebellar artery.  The left common carotid arteriogram demonstrates the left external carotid artery and its major branches to be widely patent.  The left internal carotid artery at the bulb to the cranial skull base demonstrates normal opacification.  The petrous, cavernous, and supraclinoid segments are widely patent.  The left middle cerebral artery and the left anterior cerebral artery opacify into the capillary and venous phases.  PROCEDURE: ENDOVASCULAR COMPLETE REVASCULARIZATION OF THE OCCLUDED BASILAR ARTERY.  The diagnostic JB 1 catheter in the distal right subclavian artery was exchanged over a 0.035 inch 300 cm Rosen exchange guidewire for an 8 [place] Neuron Max guide sheath using biplane roadmap technique and constant fluoroscopic guidance. Good aspiration obtained from the hub of the Neuron Max sheath. Gentle contrast injection through the sheath demonstrated no evidence of spasm or of intraluminal filling defects of the right subclavian artery. The guidewire was removed. Through the Neuron Max guide sheath, over a 0.035 inch Roadrunner guidewire, a 115 cm Catalyst guide catheter was advanced to just distal to the origin of the Neuron Max sheath. This had been retrieved just proximal to the origin of the right vertebral artery.  The 0.035 inch Roadrunner guidewire was then gently manipulated into the right vertebral artery followed by the Catalyst guide catheter to the distal right vertebral artery. At this time, the Neuron Max sheath was also advanced into the proximal 1/3 of the right vertebral artery.  The guidewire was removed. Good aspiration obtained from the hub of the 5 [place] Catalyst guide catheter. 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  Over a 0.014 inch Softip Synchro micro guidewire, an [employer] Trevo ProVue 115  cm microcatheter was advanced to just distal to the origin of the 5 [place] Catalyst guide catheter.  With the micro guidewire leading with a J-tip configuration, the combination was navigated without difficulty to the right vertebrobasilar junction proximal to the right posterior-inferior cerebellar artery.  Using biplane roadmap technique and constant fluoroscopic guidance, the J-tip 0.014 inch micro guidewire was then gently manipulated using a torque device through the occluded right vertebrobasilar junction and the occluded basilar artery. Access was obtained into the right superior cerebellar artery followed by the microcatheter. The guidewire was removed. Good aspiration obtained from the 021 microcatheter. A gentle contrast injection demonstrated free antegrade flow distally. Microcatheter was then connected to continuous heparinized saline infusion.  At this time, a 4 mm x 40 mm Solitaire X retrieval device was advanced to the distal end of the microcatheter.  The O ring on the delivery microcatheter was loosened. With slight forward gentle traction with the right hand on the delivery micro guidewire, with the left hand the delivery microcatheter was retrieved unsheathing the distal and then the proximal portion of the retrieval device. A gentle control arteriogram performed through the 5 [place] Catalyst guide catheter demonstrated no angiographic recanalization of the occluded basilar artery.  The Neuron Max guide sheath was advanced more distally in the right vertebral artery.  With constant aspiration being applied at the hub of the Neuron Max guide sheath, and also using a Penumbra vacuum aspiration device at the hub of the 5 [place] Catalyst guide  catheter, the combination of the retrieval device, the microcatheter and the 5 [place] Catalyst guide catheter were retrieved and removed as aspiration was continued at the hub of the [employer] guide sheath. Good aspiration was continued at the Neuron Max sheath. Free back bleed was noted at the hub of the Neuron Max sheath. A gentle control arteriogram performed through the Neuron Max sheath demonstrated partially recannulization of the occluded basilar artery with now opacification of the posterior cerebral arteries, the superior cerebellar arteries and the anterior-inferior cerebellar arteries proximally.  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 A subsequent arteriogram performed approximately 2 minutes after the initial, demonstrated progressively slow flow in the recanalized basilar artery with severe irregular filling defect in the right vertebrobasilar junction distal to the origin of the right posterior-inferior cerebellar artery.  Also there was now poor visualization of the posterior cerebral arteries and also the superior cerebellar arteries.  A combination of the Trevo ProVue microcatheter inside of a 5 [place] Catalyst guide catheter was then advanced over a 0.014 inch Softip Synchro micro guidewire to the distal end of the Catalyst guide catheter. With the micro guidewire leading with a J-tip configuration, using biplane roadmap technique and constant fluoroscopic guidance, the micro guidewire was then again gently manipulated through the large filling defect with high-grade stenosis in the proximal basilar artery to the distal basilar artery and the left posterior cerebral artery followed by the microcatheter. The guidewire was removed. Good aspiration obtained from the hub of the catheter. A gentle control arteriogram performed through the microcatheter demonstrated safe position of tip of the microcatheter. This was then connected to continuous heparinized  saline infusion. At this time a 5 mm x 33 mm Embotrap retrieval device was advanced to the distal end of microcatheter. The proximal and the distal landing zones were then defined. Thereafter the O ring on the delivery microcatheter was loosened. With slight forward gentle traction with the right hand on the  delivery micro guidewire with the left hand the delivery microcatheter was retrieved unsheathing the retrieval device.  Arteriograms performed through the 5 [place] Catalyst guide catheter in the right vertebrobasilar junction demonstrated improved visualization of the distal basilar artery, the posterior cerebral arteries, the superior cerebellar arteries. The anterior inferior cerebellar arteries were poorly visualized.  With constant aspiration being applied at the hub of the 5 [place] guide catheter with a Penumbra vacuum aspiration device, and a 60 mL syringe at the hub of the Neuron Max guide sheath, the combination of the retrieval device, the microcatheter and the 5 [place] Catalyst guide catheter were retrieved and removed. At this time, there was clot noted entangled in the cells of the retrieval device. The aspirate also contained clot.  A control arteriogram performed through the Neuron Max sheath after having established free back bleed demonstrated complete revascularization of the occluded basilar artery at the right vertebrobasilar junction.  A revascularization was obtained.  There was now free flow of blood in the posterior cerebral arteries, the superior cerebellar arteries. Faint opacification was noted of the right anterior inferior cerebellar artery and also in the left anterior-inferior cerebellar artery.  However, these 2 latter appeared occluded as did the right superior cerebral artery and its middle 1/3.  There continued be a narrowing noted at the mid basilar artery level.  This was monitored with control arteriograms performed at 5 and 15 minutes post second retrieval  pass.  These subsequent arteriograms continued to demonstrate progressive circumferential narrowing of the mid basilar artery with slow flow.  At this time, in a coaxial manner and with constant heparinized saline infusion using biplane roadmap technique and constant fluoroscopic guidance, a combination of the 5 [place] Catalyst 115 cm guide catheter inside of which was a 021 Trevo ProVue microcatheter was advanced over a 0.014 inch Softip Synchro micro guidewire to the distal end of the 8 [place] Neuron Max sheath. With the micro guidewire leading with a J-tip configuration, the combination was navigated to the right vertebrobasilar junction.  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 These were advanced to the left posterior cerebral artery P1 segment. The guidewire was removed. Good aspiration obtained from the hub of the microcatheter. A gentle contrast injection demonstrated safe position of tip of the microcatheter. This was then connected to continuous heparinized saline infusion. The 5 [place] Catalyst guide catheter was advanced further cranially into the right vertebrobasilar junction just distal to the origin of the right posterior-inferior cerebellar artery.  Measurements then performed of the distal and proximal landing zone of the basilar artery.  It was decided to use a 4 mm x 30 mm [employer] Enterprise vascular reconstruction device. This was then advanced using biplane roadmap technique and constant fluoroscopic guidance in a coaxial manner and with constant heparinized saline infusion to the distal end of the microcatheter.  The O ring on the delivery microcatheter was then loosened. The entire microcatheter with the stent was retrieved more proximally.  The proximal and landing zones were then reached. Thereafter, with slight forward gentle traction with the right hand on the delivery  micro guidewire, with the left hand the delivery microcatheter were retrieved unsheathing the distal, and then the proximal stent. The delivery micro guidewire was then gently retrieved and removed as was the delivery microcatheter. A control arteriogram performed through the 5 [place] Catalyst guide catheter in the left vertebrobasilar junction demonstrated excellent  apposition of flow through the stented segment of the basilar artery.  There appeared to be complete revascularization of the posterior circulation except for the right anterior-inferior cerebellar artery which was only opacified in its proximal 1/3.  Repeat arteriograms were then performed at 10 and 20 minutes post stent placement. These demonstrated intraluminal filling defects at the waist of the mid basilar artery.  This necessitated use of 4.5 mg of super selective intracranial intra-arterial Integrilin given over a few minutes.  Subsequent arteriograms performed demonstrated progressive clearing and improved caliber at the site of the previous high-grade stenosis.  A final control arteriogram performed through the Catalyst guide catheter in the right vertebrobasilar junction demonstrated significant improved caliber and flow through the stented segment of the basilar artery. There was now opacification of the posterior cerebral arteries, the superior cerebellar arteries, the left anterior-inferior cerebellar artery and partially the right anterior-inferior cerebellar artery.  Retrograde flow was noted into the left vertebrobasilar junction to the left posterior-inferior cerebellar artery.  Throughout the procedure, the patient's blood pressure and neurological status remained stable.  No angiographic evidence of extravasation or mass-effect or midline shift was noted. Prior to the positioning of the stent in the basilar artery, the patient was loaded with 180 mg of Brilinta, and 81 mg of aspirin via an orogastric tube.  The stent was  positioned approximately 20 minutes post delivery of these medications.  The 5 [place] Catalyst guide catheter and the 8 [place] Neuron Max guide sheath were then retrieved into the abdominal aorta and exchanged over a J-tip guidewire for a 6 [place] Pinnacle sheath. This in turn was removed with the successful application of a 6 [place] ExoSeal closure device in the right common femoral puncture site. The right groin appeared soft without evidence of hematoma or bleeding. Distal pulses remained Dopplerable in the dorsalis pedis, and posterior tibial arteries bilaterally unchanged from prior to the procedure.  The patient was then transferred to the neuro ICU intubated for further post endovascular treatment and management.  A Dyna CT of the brain performed demonstrated no gross evidence of intracranial hemorrhages, mass effect or midline shift. No evidence of hydrocephalus or of mass-effect was noted in the posterior fossa.  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  PLAN: Patient transferred to neuro ICU to continue with post revascularization management.   Electronically Signed   By: Luanne Bras M.D.   On: 01/05/2018 13:31   Ir Angio Vertebral Sel Vertebral Uni L Mod Sed  Result Date: 01/07/2018 INDICATION: Progressive somnolence with seizure-like activity. 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 MRI of the brain reveals multifocal diffusion weighted abnormalities involving the cerebral hemispheres, the thalami, and the  pons.  EXAM: 1. EMERGENT LARGE VESSEL OCCLUSION THROMBOLYSIS (POSTERIOR CIRCULATION)  COMPARISON:  CT angiogram of the head and neck of 01/03/2018, and MRI of the brain 01/03/2018.  MEDICATIONS: Patient received vancomycin IV prior to the procedure.  ANESTHESIA/SEDATION: General anesthesia  CONTRAST:  Isovue 300 approximately 100 cc.  FLUOROSCOPY TIME:  Fluoroscopy Time: 44 minutes 0 seconds (3201 mGy).  COMPLICATIONS: None immediate.  TECHNIQUE:  Following a full explanation of the procedure along with the potential associated complications, an informed witnessed consent was obtained spouse and family. The risks of intracranial hemorrhage of 10%, worsening neurological deficit, ventilator dependency, death and inability to revascularize were all reviewed in detail with the patient's spouse and family.  The patient was then put under general anesthesia by the Department of Anesthesiology at [facility].  The right groin was prepped and draped in the usual sterile fashion. Thereafter using modified Seldinger technique, transfemoral access into the right common femoral artery was obtained without difficulty. Over a 0.035 inch guidewire a 5 [place] Pinnacle sheath was inserted. Through this, and also over a 0.035 inch guidewire a 5 [place] JB 1 catheter was advanced to the aortic arch region and selectively positioned in the right common carotid artery, the right vertebral artery, the left vertebral artery, and the left common carotid artery.  FINDINGS: The right common carotid arteriogram demonstrates the right external carotid artery and its major branches to be widely patent.  The right internal carotid artery at the bulb to the cranial skull base opacifies normally.  The petrous, cavernous and supraclinoid segments are widely patent.  The right middle cerebral artery and the right anterior cerebral artery opacify into the capillary and venous phases.  There is a small right posterior  communicating artery which retrogradely opacifies the distal basilar artery and subsequently the right posterior cerebral artery transiently.  Right vertebral artery origin is widely patent.  The vessel is seen to opacify to the cranial skull base. Wide patency is seen of the right vertebrobasilar junction to the level of the right posterior-inferior cerebellar artery. Distal to this there is slow ascent of contrast to the distal right vertebrobasilar junction. There nonvisualization of the basilar artery.  The left vertebral artery origin is widely patent.  The vessel is seen to opacify to the cranial skull base. The vessel terminates in the left posterior inferior cerebellar artery. No angiographic opacification is noted distal to the origin of the left posterior-inferior cerebellar artery.  The left common carotid arteriogram demonstrates the left external carotid artery and its major branches to be widely patent.  The left internal carotid artery at the bulb to the cranial skull base demonstrates normal opacification.  The petrous, cavernous, and supraclinoid segments are widely patent.  The left middle cerebral artery and the left anterior cerebral artery opacify into the capillary and venous phases.  PROCEDURE: ENDOVASCULAR COMPLETE REVASCULARIZATION OF THE OCCLUDED BASILAR ARTERY.  The diagnostic JB 1 catheter in the distal right subclavian artery was exchanged over a 0.035 inch 300 cm Rosen exchange guidewire for an 8 [place] Neuron Max guide sheath using biplane roadmap technique and constant fluoroscopic guidance. Good aspiration obtained from the hub of the Neuron Max sheath. Gentle contrast injection through the sheath demonstrated no evidence of spasm or of intraluminal filling defects of the right subclavian artery. The guidewire was removed. Through the Neuron Max guide sheath, over a 0.035 inch Roadrunner guidewire, a 115 cm Catalyst guide catheter was advanced to just distal to the origin  of the Neuron Max sheath. This had been retrieved just proximal to the origin of the right vertebral artery.  The 0.035 inch Roadrunner guidewire was then gently manipulated into the right vertebral artery followed by the Catalyst guide catheter to the distal right vertebral artery. At this time, the Neuron Max sheath was also advanced into the proximal 1/3 of the right vertebral artery.  The guidewire was removed. Good  aspiration obtained from the hub of the 5 [place] Catalyst guide catheter. 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  Over a 0.014 inch Softip Synchro micro guidewire, an 021 Trevo ProVue 115 cm microcatheter was advanced to just distal to the origin of the 5 [place] Catalyst guide catheter.  With the micro guidewire leading with a J-tip configuration, the combination was navigated without difficulty to the right vertebrobasilar junction proximal to the right posterior-inferior cerebellar artery.  Using biplane roadmap technique and constant fluoroscopic guidance, the J-tip 0.014 inch micro guidewire was then gently manipulated using a torque device through the occluded right vertebrobasilar junction and the occluded basilar artery. Access was obtained into the right superior cerebellar artery followed by the microcatheter. The guidewire was removed. Good aspiration obtained from the 021 microcatheter. A gentle contrast injection demonstrated free antegrade flow distally. Microcatheter was then connected to continuous heparinized saline infusion.  At this time, a 4 mm x 40 mm Solitaire X retrieval device was advanced to the distal end of the microcatheter.  The O ring on the delivery microcatheter was loosened. With slight forward gentle traction with the right hand on the delivery micro guidewire, with the left hand the  delivery microcatheter was retrieved unsheathing the distal and then the proximal portion of the retrieval device. A gentle control arteriogram performed through the 5 [place] Catalyst guide catheter demonstrated no angiographic recanalization of the occluded basilar artery.  The Neuron Max guide sheath was advanced more distally in the right vertebral artery.  With constant aspiration being applied at the hub of the Neuron Max guide sheath, and also using a Penumbra vacuum aspiration device at the hub of the 5 [place] Catalyst guide catheter, the combination of the retrieval device, the microcatheter and the 5 [place] Catalyst guide catheter were retrieved and removed as aspiration was continued at the hub of the [employer] guide sheath. Good aspiration was continued at the Neuron Max sheath. Free back bleed was noted at the hub of the Neuron Max sheath. A gentle control arteriogram performed through the Neuron Max sheath demonstrated partially recannulization of the occluded basilar artery with now opacification of the posterior cerebral arteries, the superior cerebellar arteries and the anterior-inferior cerebellar arteries proximally.  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 A subsequent arteriogram performed approximately 2 minutes after the initial, demonstrated progressively slow flow in the recanalized basilar artery with severe irregular filling defect in the right vertebrobasilar junction distal to the origin of the right posterior-inferior cerebellar artery.  Also there was now poor visualization of the posterior cerebral arteries and also the superior cerebellar arteries.  A combination of the Trevo ProVue microcatheter inside of a 5 [place] Catalyst guide catheter was then advanced over a 0.014 inch Softip Synchro micro guidewire to the distal end of the Catalyst guide catheter. With the micro guidewire leading with a J-tip configuration, using biplane roadmap  technique and constant fluoroscopic guidance, the micro guidewire was then again gently manipulated through the large filling defect with high-grade stenosis in the proximal basilar artery to the distal basilar artery and the left posterior cerebral artery followed by the microcatheter. The guidewire was removed. Good aspiration obtained from the hub of the catheter. A gentle control arteriogram performed through the microcatheter demonstrated safe position of tip of the microcatheter. This was then connected to continuous heparinized  saline infusion. At this time a 5 mm x 33 mm Embotrap retrieval device was advanced to the distal end of microcatheter. The proximal and the distal landing zones were then defined. Thereafter the O ring on the delivery microcatheter was loosened. With slight forward gentle traction with the right hand on the delivery micro guidewire with the left hand the delivery microcatheter was retrieved unsheathing the retrieval device.  Arteriograms performed through the 5 [place] Catalyst guide catheter in the right vertebrobasilar junction demonstrated improved visualization of the distal basilar artery, the posterior cerebral arteries, the superior cerebellar arteries. The anterior inferior cerebellar arteries were poorly visualized.  With constant aspiration being applied at the hub of the 5 [place] guide catheter with a Penumbra vacuum aspiration device, and a 60 mL syringe at the hub of the Neuron Max guide sheath, the combination of the retrieval device, the microcatheter and the 5 [place] Catalyst guide catheter were retrieved and removed. At this time, there was clot noted entangled in the cells of the retrieval device. The aspirate also contained clot.  A control arteriogram performed through the Neuron Max sheath after having established free back bleed demonstrated complete revascularization of the occluded basilar artery at the right vertebrobasilar junction.  A  revascularization was obtained.  There was now free flow of blood in the posterior cerebral arteries, the superior cerebellar arteries. Faint opacification was noted of the right anterior inferior cerebellar artery and also in the left anterior-inferior cerebellar artery.  However, these 2 latter appeared occluded as did the right superior cerebral artery and its middle 1/3.  There continued be a narrowing noted at the mid basilar artery level.  This was monitored with control arteriograms performed at 5 and 15 minutes post second retrieval pass.  These subsequent arteriograms continued to demonstrate progressive circumferential narrowing of the mid basilar artery with slow flow.  At this time, in a coaxial manner and with constant heparinized saline infusion using biplane roadmap technique and constant fluoroscopic guidance, a combination of the 5 [place] Catalyst 115 cm guide catheter inside of which was a 021 Trevo ProVue microcatheter was advanced over a 0.014 inch Softip Synchro micro guidewire to the distal end of the 8 [place] Neuron Max sheath. With the micro guidewire leading with a J-tip configuration, the combination was navigated to the right vertebrobasilar junction.  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 These were advanced to the left posterior cerebral artery P1 segment. The guidewire was removed. Good aspiration obtained from the hub of the microcatheter. A gentle contrast injection demonstrated safe position of tip of the microcatheter. This was then connected to continuous heparinized saline infusion. The 5 [place] Catalyst guide catheter was advanced further cranially into the right vertebrobasilar junction just distal to the origin of the right posterior-inferior cerebellar artery.  Measurements then performed of the distal and proximal landing zone of the basilar artery.   It was decided to use a 4 mm x 30 mm [employer] vascular reconstruction device. This was then advanced using biplane roadmap technique and constant fluoroscopic guidance in a coaxial manner and with constant heparinized saline infusion to the distal end of the microcatheter.  The O ring on the delivery microcatheter was then loosened. The entire microcatheter with the stent was retrieved more proximally.  The proximal and landing zones were then reached. Thereafter, with slight forward gentle traction with the right hand on the  delivery micro guidewire, with the left hand the delivery microcatheter were retrieved unsheathing the distal, and then the proximal stent. The delivery micro guidewire was then gently retrieved and removed as was the delivery microcatheter. A control arteriogram performed through the 5 [place] Catalyst guide catheter in the left vertebrobasilar junction demonstrated excellent apposition of flow through the stented segment of the basilar artery.  There appeared to be complete revascularization of the posterior circulation except for the right anterior-inferior cerebellar artery which was only opacified in its proximal 1/3.  Repeat arteriograms were then performed at 10 and 20 minutes post stent placement. These demonstrated intraluminal filling defects at the waist of the mid basilar artery.  This necessitated use of 4.5 mg of super selective intracranial intra-arterial Integrilin given over a few minutes.  Subsequent arteriograms performed demonstrated progressive clearing and improved caliber at the site of the previous high-grade stenosis.  A final control arteriogram performed through the Catalyst guide catheter in the right vertebrobasilar junction demonstrated significant improved caliber and flow through the stented segment of the basilar artery. There was now opacification of the posterior cerebral arteries, the superior cerebellar arteries, the left anterior-inferior  cerebellar artery and partially the right anterior-inferior cerebellar artery.  Retrograde flow was noted into the left vertebrobasilar junction to the left posterior-inferior cerebellar artery.  Throughout the procedure, the patient's blood pressure and neurological status remained stable.  No angiographic evidence of extravasation or mass-effect or midline shift was noted. Prior to the positioning of the stent in the basilar artery, the patient was loaded with 180 mg of Brilinta, and 81 mg of aspirin via an orogastric tube.  The stent was positioned approximately 20 minutes post delivery of these medications.  The 5 [place] Catalyst guide catheter and the 8 [place] Neuron Max guide sheath were then retrieved into the abdominal aorta and exchanged over a J-tip guidewire for a 6 [place] Pinnacle sheath. This in turn was removed with the successful application of a 6 [place] ExoSeal closure device in the right common femoral puncture site. The right groin appeared soft without evidence of hematoma or bleeding. Distal pulses remained Dopplerable in the dorsalis pedis, and posterior tibial arteries bilaterally unchanged from prior to the procedure.  The patient was then transferred to the neuro ICU intubated for further post endovascular treatment and management.  A Dyna CT of the brain performed demonstrated no gross evidence of intracranial hemorrhages, mass effect or midline shift. No evidence of hydrocephalus or of mass-effect was noted in the posterior fossa.  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  PLAN: Patient transferred to neuro ICU to continue with post revascularization management.   Electronically Signed   By: Luanne Bras M.D.   On: 01/05/2018 13:31      Subjective: No new complaints.   Discharge Exam: Vitals:   01/15/18 0803 01/15/18 1129  BP: (!) 138/92 140/89  Pulse: 100 (!) 102  Resp: 20 20  Temp: 99 F (37.2 C) 99.1 F (37.3 C)  SpO2: 100% 100%   Vitals:   01/15/18 0002 01/15/18 0410 01/15/18 0803 01/15/18 1129  BP: (!) 131/97 (!) 118/93 (!) 138/92 140/89  Pulse: (!) 50 88 100 (!) 102  Resp: 18 18 20 20   Temp: 98.7 F (37.1 C) 98.8 F (37.1 C) 99 F (37.2 C) 99.1 F (37.3 C)  TempSrc: Oral Oral Oral Oral  SpO2: 100% 97% 100% 100%  Weight:      Height:        General: Pt is alert, awake, not in acute distress Cardiovascular: RRR, S1/S2 +, no rubs, no gallops Respiratory: CTA bilaterally, no wheezing, no rhonchi Abdominal: Soft, NT, ND, bowel sounds + Extremities: no edema, no cyanosis    The results of significant diagnostics from this hospitalization (including imaging, microbiology, ancillary and laboratory) are listed below for reference.     Microbiology: No results found for this or any previous visit (from the past 240 hour(s)).   Labs: BNP (last 3 results) No results for input(s): BNP in the last 8760 hours. Basic Metabolic Panel: Recent Labs  Lab 01/09/18 0308 01/10/18 0916 01/11/18 0600 01/12/18 0603 01/13/18 0350  NA 133* 138 136 135 136  K 2.8* 4.8 3.9 3.4* 3.7  CL 101 101 99 95* 94*  CO2 23 22 22 25 28   GLUCOSE 141* 125* 126* 120* 119*  BUN 15 14 15 10 14   CREATININE 0.85 0.86 0.95 0.97 1.16  CALCIUM 8.8* 9.7 9.5 9.6 9.9  MG 1.8  --   --   --   --    Liver Function Tests: Recent Labs  Lab 01/09/18 0308  AST 71*  ALT 64*  ALKPHOS 83  BILITOT 0.7  PROT 6.5  ALBUMIN 2.9*   No results for input(s): LIPASE, AMYLASE in the last 168 hours. No results for  input(s): AMMONIA in the last 168 hours. CBC: Recent Labs  Lab 01/09/18 0308 01/10/18 0916 01/11/18 0600 01/12/18 0603 01/13/18 0350 01/15/18 1157  WBC 0.8* 2.0* 5.3 11.6* 13.7* 10.5  NEUTROABS 0.2* 1.0* 3.2  --   --   --   HGB 7.7* 7.8* 7.6* 7.6* 8.1* 9.2*  HCT 21.9* 23.3* 23.0* 22.9* 24.8* 27.5*  MCV 90.5 93.6 94.3 92.3 94.7 94.5  PLT 75* 106* 158 226 288 362   Cardiac Enzymes: No results for input(s): CKTOTAL, CKMB, CKMBINDEX, TROPONINI in the last 168 hours. BNP: Invalid input(s): POCBNP CBG: Recent Labs  Lab 01/14/18 1141 01/14/18 1629 01/14/18 2227 01/15/18 0633 01/15/18 1137  GLUCAP 120* 102* 122* 103* 125*   D-Dimer No results for input(s): DDIMER in the last 72 hours. Hgb A1c No results for input(s): HGBA1C in the last 72 hours. Lipid Profile No results for input(s): CHOL, HDL, LDLCALC, TRIG, CHOLHDL, LDLDIRECT in the last 72 hours. Thyroid function studies No results for input(s): TSH, T4TOTAL, T3FREE, THYROIDAB in the last 72 hours.  Invalid input(s): FREET3 Anemia work up No results for input(s): VITAMINB12, FOLATE, FERRITIN, TIBC, IRON, RETICCTPCT in the last 72 hours. Urinalysis    Component Value Date/Time   COLORURINE YELLOW 01/12/2018 1106   APPEARANCEUR CLEAR 01/12/2018 1106   LABSPEC 1.012 01/12/2018 1106   PHURINE 6.0 01/12/2018 1106   GLUCOSEU NEGATIVE 01/12/2018 1106   HGBUR NEGATIVE 01/12/2018 1106   [place] 01/12/2018 1106   KETONESUR NEGATIVE 01/12/2018 1106   PROTEINUR NEGATIVE 01/12/2018 1106   UROBILINOGEN 0.2 09/13/2013 1330   NITRITE NEGATIVE 01/12/2018 1106   LEUKOCYTESUR NEGATIVE 01/12/2018 1106   Sepsis Labs Invalid input(s): PROCALCITONIN,  WBC,  LACTICIDVEN Microbiology No results found for this or any previous visit (from the past 240  hour(s)).   Time coordinating discharge:32 minutes  SIGNED:   Hosie Poisson, MD  [facility] 01/15/2018, 2:09 PM Pager   If 7PM-7AM, please contact  night-coverage [email or web address] Password [email or web address]

## 2018-01-15 NOTE — H&P (Addendum)
Physical Medicine and Rehabilitation Admission H&P        Chief Complaint  Patient presents with  . Functional deficits due to bicerebellar strokes.   . Left sided weakness with aphasia and dysphagia      HPI:  Caleb Hubbard is a 37 year old male with history of HTN, situational depression, testicular cancer S/B radical orchiectomy, recent retroperitoneal lymph node dissection with initiation of chemo 12/29/2017; who was admitted on 11/23 with confusion, dystonic posturing and concerns of seizure activity.  UDS positive for THC.  CT head done showing patchy hypodensity in bilateral cerebellum and occipital lobe suggestive of acute infarcts versus PR ES.  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 Terrilee Files.  Follow-up MRI of brain showed numerous supra and infratentorial tentorial posterior circulation nonhemorrhagic infarcts with mild cytotoxic edema.  Blood cultures x2 done due to leukocytosis and were negative.  BLE Dopplers negative for DVT.  TEE showed no cardiac source of emboli, no ASD or PFO and moderate LVH with EF of 65 to 70%.   She developed pancytopenia with drop in white count to 0.5 and was treated with Granix daily with improvement in [place].  Blood pressures have been labile and medications titrated for better control.  Heme-onc did not feel that hypercoagulable state was responsible for stroke.  Dr. Leonie Man felt that stroke embolic due to unclear service possible underlying basilar stenosis versus hypertension versus smoking/THC use.  He is to continue on low-dose ASA as well as Brilinta twice daily due to stent.  MBS done for swallow eval and patient started on dysphagia to nectar's.  He has had issues with tachycardia and CTA chest negative for PE.  Dr. Jana Hakim felt  that patient's cancer prognosis was good and does not recommend proceeding with further chemotherapy.  Patient with resultant left hemiplegia, dysphagia, aphasia and cognitive deficits affecting overall functional status.  CIR was recommended for further therapy     Review of Systems  HENT: Negative for hearing loss and tinnitus.   Eyes: Positive for blurred vision. Negative for double vision and photophobia.       Reports vision centrally  Respiratory: Negative for cough and hemoptysis.   Cardiovascular: Negative for chest pain, palpitations and leg swelling.  Gastrointestinal: Negative for constipation, heartburn and nausea.  Genitourinary: Negative for dysuria, frequency and urgency.  Skin: Negative for rash.  Neurological: Positive for sensory change (left side numb), speech change and focal weakness.  Psychiatric/Behavioral: Negative for depression. The patient is not nervous/anxious and does not have insomnia.             Past Medical History:  Diagnosis Date  . Anxiety    . Cancer ([place])    . Headache    . Hypertension    . Testicular mass             Past Surgical History:  Procedure Laterality Date  . IR ANGIO INTRA EXTRACRAN SEL COM CAROTID INNOMINATE BILAT MOD SED   01/03/2018  . IR ANGIO VERTEBRAL SEL VERTEBRAL UNI L MOD SED   01/03/2018  . IR CT HEAD LTD   01/03/2018  . IR INTRA CRAN STENT   01/03/2018  . IR PERCUTANEOUS ART THROMBECTOMY/INFUSION INTRACRANIAL INC DIAG ANGIO   01/03/2018  . NO PAST SURGERIES      . ORCHIECTOMY Right 06/17/2017  Procedure: RIGHT RADICAL ORCHIECTOMY;  Surgeon: Ceasar Mons, MD;  Location: [facility];  Service: Urology;  Laterality: Right;  . RADIOLOGY WITH ANESTHESIA N/A 01/03/2018    Procedure: CODE STROKE;  Surgeon: Luanne Bras, MD;  Location: [facility];  Service: Radiology;  Laterality: N/A;           Family History  Problem Relation Age of Onset  . High blood pressure Father        Social History:  Single. Has  been unemployed since diagnosis of cancer? Lives with girlfriend (disabled due to psychological issues?) and her 90 year old child. Parents live in [place] and supportive.  Per reports that he has been smoking cigarettes and cigars. He has never used smokeless tobacco. Per  Reports he has current or past drug history. Drug: Marijuana. Per reports  he does not drink alcohol.      Allergies: No Known Allergies            Medications Prior to Admission  Medication Sig Dispense Refill  . amLODipine (NORVASC) 10 MG tablet Take 10 mg by mouth daily.      . chlorthalidone (HYGROTON) 25 MG tablet Take 25 mg by mouth daily.   2  . cloNIDine (CATAPRES) 0.2 MG tablet Take 0.2 mg by mouth 2 (two) times daily.      Marland Kitchen dexamethasone (DECADRON) 4 MG tablet Take 4 mg by mouth See admin instructions. Take 2 tablets by mouth once daily for 3 days start the day after chemotherapy and continue for 3 days   0  . OLANZapine (ZYPREXA) 10 MG tablet Take 10 mg by mouth See admin instructions. Take 1 tablet by mouth nightly for 3 doses start the day after chemotherapy and continue for 3 days   2  . ondansetron (ZOFRAN) 8 MG tablet Take 8 mg by mouth every 8 (eight) hours as needed for nausea/vomiting.   2  . prochlorperazine (COMPAZINE) 10 MG tablet Take 10 mg by mouth every 6 (six) hours as needed for nausea/vomiting.   2      Drug Regimen Review  Drug regimen was reviewed and remains appropriate with no significant issues identified   Home: Home Living Family/patient expects to be discharged to:: Skilled nursing facility Living Arrangements: Alone Available Help at Discharge: Family Type of Home: House Home Access: Stairs to enter Technical brewer of Steps: 3 Entrance Stairs-Rails: None Home Layout: Two level, Able to live on main level with bedroom/bathroom Bathroom Shower/Tub: Multimedia programmer: Standard Home Equipment: None Additional Comments: lives with significant other, but  home setup is for his parents house in [place] as his mother would like to take him home. (information provided from mother )  Lives With: Family   Functional History: Prior Function Level of Independence: Independent Comments: driving, not working    Functional Status:  Mobility: Bed Mobility Overal bed mobility: Needs Assistance Bed Mobility: Supine to Sit Rolling: Min assist Sidelying to sit: Mod assist Supine to sit: Min assist, +2 for physical assistance Sit to supine: Mod assist, +2 for physical assistance, HOB elevated General bed mobility comments: Pt was able to sit up in bed with no assistance with flat HOB.  Pt required VC and min A to move B LE towards EOB.  Patient initiated movement with  R LE but required increased assistance to move L LE. Transfers Overall transfer level: Needs assistance Equipment used: Ambulation equipment used Transfer via Lift Equipment: Stedy Transfers: Sit to/from [employer] to Stand: [employer]  assist, Mod assist, +2 physical assistance Stand pivot transfers: Mod assist, +2 physical assistance General transfer comment: Pt required mod A +2 and VC to scoot to EOB.  Patient was able to hold front stedy bar with R hand.  Pt was unable to grip stedy bar with L hand and required support from therapist.  Pt completed 3 sit to stands to stedy requiring mod A+2 for first attempt and min A +2 for last 2 attempts.  Pt was able to pull into standing with R hand.   Ambulation/Gait Ambulation/Gait assistance: Total assist Gait Distance (Feet): 1 Feet Gait Pattern/deviations: Step-to pattern   ADL: ADL Overall ADL's : Needs assistance/impaired Eating/Feeding: Minimal assistance, Sitting Eating/Feeding Details (indicate cue type and reason): dysphagia 2; per GF report pt should use spoon? utilized spoon initially however pt frustrated with this method, provided supervision while pt took sips of juice (modified consistency), though requires cues for small sips  and to pause to swallow Grooming: Moderate assistance Grooming Details (indicate cue type and reason): difficulty controlling secretions in sitting Upper Body Bathing: Total assistance, Sitting Lower Body Bathing: Total assistance, +2 for physical assistance, Bed level Upper Body Dressing : Maximal assistance Upper Body Dressing Details (indicate cue type and reason): hand over hand to initiate donning gown over hemi arm Lower Body Dressing: Total assistance, +2 for physical assistance, Bed level Toilet Transfer Details (indicate cue type and reason): deferred due to safety Toileting- Clothing Manipulation and Hygiene: Total assistance, Bed level, +2 for physical assistance Functional mobility during ADLs: Maximal assistance, +2 for physical assistance General ADL Comments: use of Stedy for transfer OOB to chair during this session; pt with improvements in mobility, requires encouragement intermittently during session to participate   Cognition: Cognition Overall Cognitive Status: Impaired/Different from baseline Arousal/Alertness: Awake/alert Orientation Level: Oriented to person, Oriented to place, Oriented to situation Attention: Selective Selective Attention: Impaired Selective Attention Impairment: Verbal basic, Functional basic Memory: Impaired Memory Impairment: Retrieval deficit, Decreased recall of new information, Decreased short term memory Decreased Short Term Memory: Verbal basic, Functional basic Awareness: Impaired Awareness Impairment: Emergent impairment Problem Solving: Impaired Problem Solving Impairment: Verbal complex, Functional complex Safety/Judgment: Impaired Cognition Arousal/Alertness: Awake/alert Behavior During Therapy: Flat affect Overall Cognitive Status: Impaired/Different from baseline Area of Impairment: Attention, Safety/judgement, Awareness, Problem solving Current Attention Level: Selective Following Commands: Follows one step commands  consistently Safety/Judgement: Decreased awareness of safety, Decreased awareness of deficits Awareness: Emergent Problem Solving: Slow processing, Decreased initiation, Difficulty sequencing, Requires verbal cues, Requires tactile cues General Comments: pt overall following 1 step commands consistently this session, continues to require cue and increased time for processing Difficult to assess due to: (pt often with soft spoken/mumbled speech)     Blood pressure 140/89, pulse (!) 102, temperature 99.1 F (37.3 C), temperature source Oral, resp. rate 20, height 5\' 11"  (1.803 m), weight 61.1 kg, SpO2 100 %. Physical Exam  Nursing note and vitals reviewed. Constitutional: He is oriented to person, place, and time. He appears well-developed.  Thin adult male.   Neurological: He is alert and oriented to person, place, and time.  Dysphonic--able to speak in a whisper but decreased breath support. Right visual field deficits. Dense left hemiparesis with flexor tone LUE. Sensory deficits left hemibody? Able to follow simple motor commands. Has poor insight/awareness of deficits.   Skin: He is not diaphoretic.   Motor strength is 0/5 in the left upper limb and left lower limb 4 in the right upper limb and right lower limb in  the deltoid bicep tricep grip hip flexor knee extensor ankle dorsi flexor Sensation difficult to assess secondary to communication as well as decreased attention however does identify which toe is touched  Orientation is to person and place but not to situation Lab Results Last 48 Hours        Results for orders placed or performed during the hospital encounter of 01/03/18 (from the past 48 hour(s))  Glucose, capillary     Status: Abnormal    Collection Time: 01/13/18  4:17 PM  Result Value Ref Range    Glucose-Capillary 127 (H) 70 - 99 mg/dL    Comment 1 Notify RN      Comment 2 Document in Chart    Glucose, capillary     Status: Abnormal    Collection Time: 01/13/18   9:51 PM  Result Value Ref Range    Glucose-Capillary 110 (H) 70 - 99 mg/dL    Comment 1 Notify RN      Comment 2 Document in Chart    Beta hCG quant (ref lab)     Status: None    Collection Time: 01/14/18  4:36 AM  Result Value Ref Range    hCG Quant <1 0 - 3 mIU/mL      Comment: (NOTE) Roche ECLIA methodology Performed At: [facility] [place], [place] [id number] Rush Farmer MD OV:[id number]    AFP tumor marker     Status: None    Collection Time: 01/14/18  4:36 AM  Result Value Ref Range    AFP, Serum, Tumor Marker 2.5 0.0 - 8.3 ng/mL      Comment: (NOTE) Roche [employer]ostics Electrochemiluminescence Immunoassay (ECLIA) Values obtained with different assay methods or kits cannot be used interchangeably.  Results cannot be interpreted as absolute evidence of the presence or absence of malignant disease. This test is not interpretable in pregnant females. Performed At: [facility] [place], [place] [id number] Rush Farmer MD SE:[id number]    Glucose, capillary     Status: Abnormal    Collection Time: 01/14/18  6:39 AM  Result Value Ref Range    Glucose-Capillary 109 (H) 70 - 99 mg/dL    Comment 1 Notify RN      Comment 2 Document in Chart    Glucose, capillary     Status: Abnormal    Collection Time: 01/14/18 11:41 AM  Result Value Ref Range    Glucose-Capillary 120 (H) 70 - 99 mg/dL    Comment 1 Document in Chart    Glucose, capillary     Status: Abnormal    Collection Time: 01/14/18  4:29 PM  Result Value Ref Range    Glucose-Capillary 102 (H) 70 - 99 mg/dL    Comment 1 Notify RN      Comment 2 Document in Chart    Glucose, capillary     Status: Abnormal    Collection Time: 01/14/18 10:27 PM  Result Value Ref Range    Glucose-Capillary 122 (H) 70 - 99 mg/dL    Comment 1 Notify RN      Comment 2 Document in Chart    Glucose, capillary     Status: Abnormal    Collection Time: 01/15/18  6:33 AM  Result Value Ref  Range    Glucose-Capillary 103 (H) 70 - 99 mg/dL    Comment 1 QC Due      Comment 2 Notify RN      Comment 3 Document in Chart  Glucose, capillary     Status: Abnormal    Collection Time: 01/15/18 11:37 AM  Result Value Ref Range    Glucose-Capillary 125 (H) 70 - 99 mg/dL    Comment 1 Notify RN      Comment 2 Document in Chart    CBC     Status: Abnormal    Collection Time: 01/15/18 11:57 AM  Result Value Ref Range    WBC 10.5 4.0 - 10.5 K/uL    RBC 2.91 (L) 4.22 - 5.81 MIL/uL    Hemoglobin 9.2 (L) 13.0 - 17.0 g/dL    HCT 27.5 (L) 39.0 - 52.0 %    MCV 94.5 80.0 - 100.0 fL    MCH 31.6 26.0 - 34.0 pg    MCHC 33.5 30.0 - 36.0 g/dL    RDW 15.8 (H) 11.5 - 15.5 %    Platelets 362 150 - 400 K/uL    nRBC 0.6 (H) 0.0 - 0.2 %      Comment: Performed at [facility], [address] [place], [place] [address]       Imaging Results (Last 48 hours)  Ct Angio Chest Pe W Or Wo Contrast   Result Date: 01/13/2018 CLINICAL DATA:  37 y/o M; unexplained tachycardia. History of testicular cancer receiving chemotherapy and brainstem stroke. EXAM: CT ANGIOGRAPHY CHEST WITH CONTRAST TECHNIQUE: Multidetector CT imaging of the chest was performed using the standard protocol during bolus administration of intravenous contrast. Multiplanar CT image reconstructions and MIPs were obtained to evaluate the vascular anatomy. CONTRAST:  175mL ISOVUE-370 IOPAMIDOL (ISOVUE-370) INJECTION 76% COMPARISON:  None. FINDINGS: Cardiovascular: Satisfactory opacification of the pulmonary arteries to the segmental level. No evidence of pulmonary embolism. Normal heart size. No pericardial effusion. Mediastinum/Nodes: No enlarged mediastinal, hilar, or axillary lymph nodes. Thyroid gland, trachea, and esophagus demonstrate no significant findings. Lungs/Pleura: Lungs are clear. No pleural effusion or pneumothorax. Small blebs in the lung apices. Upper Abdomen: No acute abnormality. Musculoskeletal: No chest wall  abnormality. No acute or significant osseous findings. Review of the MIP images confirms the above findings. IMPRESSION: No pulmonary embolus identified.  Negative CTA of the chest. Electronically Signed   By: Kristine Garbe M.D.   On: 01/13/2018 17:38             Medical Problem List and Plan: 1.  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  DVT Prophylaxis/Anticoagulation: Pharmaceutical: Lovenox 3. Pain Management: tylenol prn 4. Mood: Denies anxiety or depression. Monitor for now. LCSW to follow for evaluation and support.  5. Neuropsych: This patient is not fully capable of making decisions on his own behalf. 6. Skin/Wound Care: routine pressure relief measures.  7. Fluids/Electrolytes/Nutrition: Monitor I/O. Check lytes in am.  8. HTN: Monitor BID. Continue metoprolol and catapres.   9. Pancytopenia: Has resolved post Granix--d/c neutropenic precautions.  10.  Prediabetes: Hgb A1c- 6.4. Monitor BS ac/hs and use SSI for elevated BS 11.  Testicular cancer:Dr. Magrinat  recommends no further chemo and monitoring AFP/HCG every 6 months X 2 years.   12. Hypertriglyceridemia: On lipitor.  13. Depression: Was on Zyprexa for mood stabilization. May need to resume given depressive symptoms.    Post Admission Physician Evaluation: 1. Functional deficits secondary  to basilar artery occlusion. 2. Patient admitted to receive collaborative, interdisciplinary care between the physiatrist, rehab nursing staff, and therapy team. 3. Patient's level of medical complexity and substantial therapy needs in context  of that medical necessity cannot be provided at a lesser intensity of care. 4. Patient has experienced substantial functional loss from his/her baseline. Judging by the patient's diagnosis, physical exam, and functional history, the patient has potential for functional progress which will result in  measurable gains while on inpatient rehab.  These gains will be of substantial and practical use upon discharge in facilitating mobility and self-care at the household level. 5. Physiatrist will provide 24 hour management of medical needs as well as oversight of the therapy plan/treatment and provide guidance as appropriate regarding the interaction of the two. 6. 24 hour rehab nursing will assist in the management of  bladder management, bowel management, safety, skin/wound care, disease management, medication administration, pain management and patient education  and help integrate therapy concepts, techniques,education, etc. 7. PT will assess and treat for:pre gait, gait training, endurance , safety, equipment, neuromuscular re education  .  Goals are: minimal assist. 8. OT will assess and treat for ADLs, Cognitive perceptual skills, Neuromuscular re education, safety, endurance, equipment  .  Goals are: minimal assist.  9. SLP will assess and treat for attention, concentration, memory , problem solving , thought organization  .  Goals are: moderate assist. 10. Case Management and Social Worker will assess and treat for psychological issues and discharge planning. 11. Team conference will be held weekly to assess progress toward goals and to determine barriers to discharge. 12.  Patient will receive at least 3 hours of therapy per day at least 5 days per week. 13. ELOS and Prognosis: 25-28d fair      "I have personally performed a face to face diagnostic evaluation of this patient.  Additionally, I have reviewed and concur with the physician assistant's documentation above." [name] M.D. [facility] FAAPM&R (Sports Med, Neuromuscular Med) Diplomate Am Board of [place], PA-C 01/15/2018

## 2018-01-15 NOTE — Progress Notes (Signed)
Patient ID: Caleb Hubbard, male   DOB: 08/18/1980, 37 y.o.   MRN: [id number] Patient admitted to 4W01 via bed, escorted by nursing staff and family.  Patient and family verbalized understanding of rehab process, specifically fall prevention policy, visitation policy, and personal belongings policy.  Patient appears sad and anxious, avoids eye contact.  Could benefit from neuropsych eval given history and present diagnosis.  Appears to be in no immediate distress at this time.  Brita Romp, RN

## 2018-01-15 NOTE — PMR Pre-admission (Signed)
PMR Admission Coordinator Pre-Admission Assessment  Patient: Caleb Hubbard is an 37 y.o., male MRN: [id number] DOB: 1980-09-23 Height: 5\' 11"  (180.3 cm) Weight: 61.1 kg              Insurance Information Self pay - no insurance  Note:  Patient has medicaid for family planning waiver services only with coverage code [id number]  Medicaid Application Date:        Case Manager:   Disability Application Date:        Case Worker:    Emergency Facilities manager Information    Name Relation Home Work Mobile   [place], [place] Mother   [phone]   [facility] Relative [phone]     Rebekah [place] Significant other   [phone]     Current Medical History  Patient Admitting Diagnosis: 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 male with history of HTN, situational depression, testicular cancer status post radical orchiectomy, recent retroperitoneal lymph node dissection with initiation of chemo11/18; who was admitted on 01/03/2018 with confusion, dystonic posturing and concerns of seizure seizure activity.  UDS positive for THC. CT head done showing patchy low density in bilateral cerebellum and occipital lobes suggestive of acute infarcts versus PRES. 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  He underwent cerebral angiogram with endovascular revascularization of occluded basilar artery followed by rescue stent of severe focal stenosis in mid basilar artery by Dr. Estanislado Pandy.  Follow-up MRI of brain showed numerous supra and infratentorial posterior circulation nonhemorrhagic infarcts with mild cytotoxic edema.  Blood cultures x2 done due to leukocytosis and negative so far.  Bilateral lower extreme Dopplers negative for DVT.  TEE showed no cardiac source of emboli, no ASD or PFO, no wall abnormality and moderate LVH with EF of 65 to 70%..  Placed  on neutropenic precautions due to drop in WBC to 0.5 and pancytopenic today. Therapy evaluations were completed.  Patient lethargic with difficulty following one-step commands, delayed processing and noted to have flaccid left upper extremity.  Dr. Leonie Man felt stroke embolic with and unclear source-that could be paradoxical emboli to continue aspirin and Brilinta given stent. CIR recommended due  to functional deficits. Currently on a dysphagia 2, nectar thick liquids diet with SLP following.  Complete NIHSS TOTAL: 12  Past Medical History  Past Medical History:  Diagnosis Date  . Anxiety   . Cancer ([place])   . Headache   . Hypertension   . Testicular mass     Family History  family history includes High blood pressure in his father.  Prior Rehab/Hospitalizations: Recent orchiectomy with chemotherapy.  Has the patient had major surgery during 100 days prior to admission? Yes.  Lymph node dissection in 09/19.  Current Medications   Current Facility-Administered Medications:  .  [DISCONTINUED] acetaminophen (TYLENOL) tablet 650 mg, 650 mg, Oral, Q4H PRN **OR** acetaminophen (TYLENOL) solution 650 mg, 650 mg, Oral, Q4H PRN, 650 mg at 01/14/18 2116 **OR** acetaminophen (TYLENOL) suppository 650 mg, 650 mg, Rectal, Q4H PRN, Domenic Polite, MD .  amLODipine (NORVASC) tablet 10 mg, 10 mg, Oral, Daily, Domenic Polite, MD, 10 mg at 01/15/18 1105 .  [DISCONTINUED] aspirin chewable tablet 81 mg, 81 mg, Oral, Daily **OR** aspirin chewable tablet 81 mg, 81 mg, Oral, Daily, Domenic Polite, MD, 81 mg at 01/15/18 1104 .  chlorhexidine (PERIDEX) 0.12 % solution 15 mL, 15 mL, Mouth Rinse, BID, Sethi, Lucy Antigua, MD, 15 mL  at 01/14/18 2116 .  cloNIDine (CATAPRES) tablet 0.1 mg, 0.1 mg, Oral, Daily, Domenic Polite, MD, 0.1 mg at 01/15/18 1105 .  feeding supplement (ENSURE ENLIVE) (ENSURE ENLIVE) liquid 237 mL, 237 mL, Oral, TID BM, Domenic Polite, MD, 237 mL at 01/14/18 2116 .  heparin injection 5,000  Units, 5,000 Units, Subcutaneous, Q8H, Rosalin Hawking, MD, 5,000 Units at 01/14/18 2116 .  iohexol (OMNIPAQUE) 300 MG/ML solution 110 mL, 110 mL, Intravenous, Once PRN, Deveshwar, Sanjeev, MD .  MEDLINE mouth rinse, 15 mL, Mouth Rinse, q12n4p, Garvin Fila, MD, 15 mL at 01/12/18 1531 .  metoprolol tartrate (LOPRESSOR) tablet 25 mg, 25 mg, Oral, BID, Domenic Polite, MD, 25 mg at 01/15/18 1104 .  pantoprazole sodium (PROTONIX) 40 mg/20 mL oral suspension 40 mg, 40 mg, Oral, Q24H, Domenic Polite, MD, 40 mg at 01/14/18 1051 .  RESOURCE THICKENUP CLEAR, , Oral, PRN, Domenic Polite, MD .  ticagrelor [facility]) tablet 90 mg, 90 mg, Oral, BID, 90 mg at 01/15/18 1104 **OR** [DISCONTINUED] ticagrelor (BRILINTA) tablet 90 mg, 90 mg, Per Tube, BID, Deveshwar, Sanjeev, MD, 90 mg at 01/12/18 1007  Patients Current Diet:  Diet Order            Diet - low sodium heart healthy        DIET DYS 2 Room service appropriate? Yes; Fluid consistency: Nectar Thick  Diet effective now              Precautions / Restrictions Precautions Precautions: Fall Precaution Comments: corretrack Restrictions Weight Bearing Restrictions: No   Has the patient had 2 or more falls or a fall with injury in the past year?No  Prior Activity Level Community (5-7x/wk): Went out 5 X a week, was driving.  Worked at a [employer] in [employer] jobs, but not since Chemotherapy started.  Home Assistive Devices / Equipment Home Assistive Devices/Equipment: None Home Equipment: None  Prior Device Use: Indicate devices/aids used by the patient prior to current illness, exacerbation or injury? None  Prior Functional Level Prior Function Level of Independence: Independent Comments: driving, not working   Self Care: Did the patient need help bathing, dressing, using the toilet or eating?  Independent  Indoor Mobility: Did the patient need assistance with walking from room to room (with or without device)?  Independent  Stairs: Did the patient need assistance with internal or external stairs (with or without device)? Independent  Functional Cognition: Did the patient need help planning regular tasks such as shopping or remembering to take medications? Independent  Current Functional Level Cognition  Arousal/Alertness: Awake/alert Overall Cognitive Status: Impaired/Different from baseline Difficult to assess due to: (pt often with soft spoken/mumbled speech) Current Attention Level: Selective Orientation Level: Oriented to person, Oriented to place, Oriented to situation Following Commands: Follows one step commands consistently Safety/Judgement: Decreased awareness of safety, Decreased awareness of deficits General Comments: pt overall following 1 step commands consistently this session, continues to require cue and increased time for processing Attention: Selective Selective Attention: Impaired Selective Attention Impairment: Verbal basic, Functional basic Memory: Impaired Memory Impairment: Retrieval deficit, Decreased recall of new information, Decreased short term memory Decreased Short Term Memory: Verbal basic, Functional basic Awareness: Impaired Awareness Impairment: Emergent impairment Problem Solving: Impaired Problem Solving Impairment: Verbal complex, Functional complex Safety/Judgment: Impaired    Extremity Assessment (includes Sensation/Coordination)  Upper Extremity Assessment: LUE deficits/detail RUE Deficits / Details: WFL  LUE Deficits / Details: developing flexor synergy; beginning to perform synergy pattern voluntarily; and able to move minimally out of  synergy pattern; Brunstrom stage II/ III arm; III hand - not using functionally LUE Sensation: WNL LUE Coordination: decreased fine motor, decreased gross motor  Lower Extremity Assessment: Defer to PT evaluation LLE Deficits / Details: flaccid, no initiation of movement, no withdrawl to pain however gave thumbs up  with R accurately when asked if he was being touched    ADLs  Overall ADL's : Needs assistance/impaired Eating/Feeding: Minimal assistance, Sitting Eating/Feeding Details (indicate cue type and reason): dysphagia 2; per GF report pt should use spoon? utilized spoon initially however pt frustrated with this method, provided supervision while pt took sips of juice (modified consistency), though requires cues for small sips and to pause to swallow Grooming: Moderate assistance Grooming Details (indicate cue type and reason): difficulty controlling secretions in sitting Upper Body Bathing: Total assistance, Sitting Lower Body Bathing: Total assistance, +2 for physical assistance, Bed level Upper Body Dressing : Maximal assistance Upper Body Dressing Details (indicate cue type and reason): hand over hand to initiate donning gown over hemi arm Lower Body Dressing: Total assistance, +2 for physical assistance, Bed level Toilet Transfer Details (indicate cue type and reason): deferred due to safety Toileting- Clothing Manipulation and Hygiene: Total assistance, Bed level, +2 for physical assistance Functional mobility during ADLs: Maximal assistance, +2 for physical assistance General ADL Comments: use of Stedy for transfer OOB to chair during this session; pt with improvements in mobility, requires encouragement intermittently during session to participate    Mobility  Overal bed mobility: Needs Assistance Bed Mobility: Supine to Sit Rolling: Min assist Sidelying to sit: Mod assist Supine to sit: Min assist, +2 for physical assistance Sit to supine: Mod assist, +2 for physical assistance, HOB elevated General bed mobility comments: Pt was able to sit up in bed with no assistance with flat HOB.  Pt required VC and min A to move B LE towards EOB.  Patient initiated movement with  R LE but required increased assistance to move L LE.    Transfers  Overall transfer level: Needs assistance Equipment  used: Ambulation equipment used Transfer via Lift Equipment: Stedy Transfers: Sit to/from [employer] to Stand: Min assist, Mod assist, +2 physical assistance Stand pivot transfers: Mod assist, +2 physical assistance General transfer comment: Pt required mod A +2 and VC to scoot to EOB.  Patient was able to hold front stedy bar with R hand.  Pt was unable to grip stedy bar with L hand and required support from therapist.  Pt completed 3 sit to stands to stedy requiring mod A+2 for first attempt and min A +2 for last 2 attempts.  Pt was able to pull into standing with R hand.      Ambulation / Gait / Stairs / Wheelchair Mobility  Ambulation/Gait Ambulation/Gait assistance: Total assist Gait Distance (Feet): 1 Feet Gait Pattern/deviations: Step-to pattern    Posture / Balance Dynamic Sitting Balance Sitting balance - Comments: occasional minA for sitting balance, overall requires use of single UE support; attempted dynamic reaching activity however pt initiating return to supine with attempts (reports due to fatigue)  Balance Overall balance assessment: Needs assistance Sitting-balance support: Feet supported, Single extremity supported Sitting balance-Leahy Scale: Poor Sitting balance - Comments: occasional minA for sitting balance, overall requires use of single UE support; attempted dynamic reaching activity however pt initiating return to supine with attempts (reports due to fatigue)  Postural control: Posterior lean Standing balance support: Bilateral upper extremity supported, Single extremity supported Standing balance-Leahy Scale: Poor Standing balance comment: reliant  on UE support/ external assist     Special needs/care consideration BiPAP/CPAP No CPM No Continuous Drip IV No Dialysis No         Life Vest No Oxygen No Special Bed No Trach Size No Wound Vac (area) No    Skin No                          Bowel mgmt: Last BM 01/13/18 Bladder mgmt: External urinary catheter in  place Diabetic mgmt No    Previous Home Environment Living Arrangements: Alone  Lives With: Family Available Help at Discharge: Family Type of Home: House Home Layout: Two level, Able to live on main level with bedroom/bathroom Home Access: Stairs to enter Entrance Stairs-Rails: None Entrance Stairs-Number of Steps: 3 Bathroom Shower/Tub: Multimedia programmer: Standard Home Care Services: No Additional Comments: lives with significant other, but home setup is for his parents house in [place] as his mother would like to take him home. (information provided from mother )  Discharge Living Setting Plans for Discharge Living Setting: House, Lives with (comment)(Plans to go home with parents.) Type of Home at Discharge: House Discharge Home Layout: Two level, Able to live on main level with bedroom/bathroom Alternate Level Stairs-Number of Steps: Flight Discharge Home Access: Stairs to enter Technical brewer of Steps: 4 steps Discharge Bathroom Shower/Tub: Walk-in shower, Door Discharge Bathroom Toilet: Standard Discharge Bathroom Accessibility: Yes How Accessible: Accessible via walker Does the patient have any problems obtaining your medications?: No  Social/Family/Support Systems Patient Roles: Other (Comment)(Mom and Dad in [place], has a girlfriend locally.) Contact Information: Psychologist, educational - mother Anticipated Caregiver: mom and dad Anticipated Caregiver's Contact Information: Myrtie Soman - mom - [phone] Ability/Limitations of Caregiver: Girlfriend with medical issures.  Mom and Dad can assist, dad works. Caregiver Availability: 24/7 Discharge Plan Discussed with Primary Caregiver: Yes Is Caregiver In Agreement with Plan?: Yes Does Caregiver/Family have Issues with Lodging/Transportation while Pt is in Rehab?: No  Goals/Additional Needs Patient/Family Goal for Rehab: PT/OT/SLP supervision to min assist goals Expected length of stay: 14-20  days Cultural Considerations: None Dietary Needs: Dys 2, nectar thick liquids Equipment Needs: TBD Pt/Family Agrees to Admission and willing to participate: Yes Program Orientation Provided & Reviewed with Pt/Caregiver Including Roles  & Responsibilities: Yes  Decrease burden of Care through IP rehab admission: N/A  Possible need for SNF placement upon discharge: Not planned  Patient Condition: This patient's medical and functional status has changed since the consult dated: 01/07/18 in which the Rehabilitation Physician determined and documented that the patient's condition is appropriate for intensive rehabilitative care in an inpatient rehabilitation facility. See "History of Present Illness" (above) for medical update. Functional changes are:  Currently requiring min to mod for transfers and mod to max assist for ADLs. Patient's medical and functional status update has been discussed with the Rehabilitation physician and patient remains appropriate for inpatient rehabilitation. Will admit to inpatient rehab today.  Preadmission Screen Completed By:  Retta Diones, 01/15/2018 2:08 PM ______________________________________________________________________   Discussed status with Dr. Letta Pate on 01/15/18 at 1408 and received telephone approval for admission today.  Admission Coordinator:  Retta Diones, time 1408/Date 01/15/18

## 2018-01-16 ENCOUNTER — Inpatient Hospital Stay (HOSPITAL_COMMUNITY): Payer: Medicaid Other | Admitting: Physical Therapy

## 2018-01-16 ENCOUNTER — Inpatient Hospital Stay (HOSPITAL_COMMUNITY): Payer: Medicaid Other

## 2018-01-16 ENCOUNTER — Inpatient Hospital Stay (HOSPITAL_COMMUNITY): Payer: Medicaid Other | Admitting: Speech Pathology

## 2018-01-16 DIAGNOSIS — R414 Neurologic neglect syndrome: Secondary | ICD-10-CM

## 2018-01-16 DIAGNOSIS — I69319 Unspecified symptoms and signs involving cognitive functions following cerebral infarction: Secondary | ICD-10-CM

## 2018-01-16 DIAGNOSIS — G8114 Spastic hemiplegia affecting left nondominant side: Secondary | ICD-10-CM

## 2018-01-16 DIAGNOSIS — I651 Occlusion and stenosis of basilar artery: Secondary | ICD-10-CM

## 2018-01-16 DIAGNOSIS — I639 Cerebral infarction, unspecified: Secondary | ICD-10-CM

## 2018-01-16 LAB — CBC WITH DIFFERENTIAL/PLATELET
BLASTS: 0 %
Band Neutrophils: 0 %
Basophils Absolute: 0 10*3/uL (ref 0.0–0.1)
Basophils Relative: 0 %
Eosinophils Absolute: 0 10*3/uL (ref 0.0–0.5)
Eosinophils Relative: 0 %
HCT: 27.1 % — ABNORMAL LOW (ref 39.0–52.0)
Hemoglobin: 8.4 g/dL — ABNORMAL LOW (ref 13.0–17.0)
LYMPHS ABS: 2.7 10*3/uL (ref 0.7–4.0)
Lymphocytes Relative: 20 %
MCH: 30.5 pg (ref 26.0–34.0)
MCHC: 31 g/dL (ref 30.0–36.0)
MCV: 98.5 fL (ref 80.0–100.0)
Metamyelocytes Relative: 0 %
Monocytes Absolute: 1.5 10*3/uL — ABNORMAL HIGH (ref 0.1–1.0)
Monocytes Relative: 11 %
Myelocytes: 0 %
NEUTROS PCT: 69 %
Neutro Abs: 9.1 10*3/uL — ABNORMAL HIGH (ref 1.7–7.7)
Other: 0 %
Platelets: 485 10*3/uL — ABNORMAL HIGH (ref 150–400)
Promyelocytes Relative: 0 %
RBC: 2.75 MIL/uL — ABNORMAL LOW (ref 4.22–5.81)
RDW: 16.1 % — AB (ref 11.5–15.5)
WBC: 13.3 10*3/uL — ABNORMAL HIGH (ref 4.0–10.5)
nRBC: 0 /100 WBC
nRBC: 0.5 % — ABNORMAL HIGH (ref 0.0–0.2)

## 2018-01-16 LAB — COMPREHENSIVE METABOLIC PANEL
ALT: 44 U/L (ref 0–44)
ANION GAP: 13 (ref 5–15)
AST: 30 U/L (ref 15–41)
Albumin: 3.9 g/dL (ref 3.5–5.0)
Alkaline Phosphatase: 139 U/L — ABNORMAL HIGH (ref 38–126)
BUN: 13 mg/dL (ref 6–20)
CO2: 28 mmol/L (ref 22–32)
Calcium: 10.2 mg/dL (ref 8.9–10.3)
Chloride: 96 mmol/L — ABNORMAL LOW (ref 98–111)
Creatinine, Ser: 1.13 mg/dL (ref 0.61–1.24)
GFR calc non Af Amer: >60 mL/min (ref >60)
Glucose, Bld: 125 mg/dL — ABNORMAL HIGH (ref 70–99)
Potassium: 4.6 mmol/L (ref 3.5–5.1)
Sodium: 137 mmol/L (ref 135–145)
Total Bilirubin: 0.3 mg/dL (ref 0.3–1.2)
Total Protein: 7.9 g/dL (ref 6.5–8.1)

## 2018-01-16 LAB — GLUCOSE, CAPILLARY
Glucose-Capillary: 113 mg/dL — ABNORMAL HIGH (ref 70–99)
Glucose-Capillary: 114 mg/dL — ABNORMAL HIGH (ref 70–99)
Glucose-Capillary: 105 mg/dL — ABNORMAL HIGH (ref 70–99)
Glucose-Capillary: 112 mg/dL — ABNORMAL HIGH (ref 70–99)

## 2018-01-16 MED ORDER — LIDOCAINE HCL URETHRAL/MUCOSAL 2 % EX GEL
1.0000 "application " | CUTANEOUS | Status: DC | PRN
  Filled 2018-01-16: qty 5

## 2018-01-16 MED ORDER — ADULT MULTIVITAMIN W/MINERALS CH
1.0000 | ORAL_TABLET | Freq: Every day | ORAL | Status: DC
  Administered 2018-01-16 – 2018-02-09 (×25): 1 via ORAL
  Filled 2018-01-16 (×24): qty 1

## 2018-01-16 MED ORDER — ENOXAPARIN SODIUM 40 MG/0.4ML ~~LOC~~ SOLN
40.0000 mg | SUBCUTANEOUS | Status: DC
  Administered 2018-01-16 – 2018-02-03 (×19): 40 mg via SUBCUTANEOUS
  Filled 2018-01-16 (×24): qty 0.4

## 2018-01-16 MED ORDER — ACETAMINOPHEN 325 MG PO TABS: 325.0000 mg | ORAL_TABLET | ORAL | Status: AC | PRN

## 2018-01-16 MED ORDER — TRAZODONE HCL 50 MG PO TABS
50.0000 mg | ORAL_TABLET | Freq: Every day | ORAL | Status: DC
  Administered 2018-01-16 – 2018-01-19 (×4): 50 mg via ORAL
  Filled 2018-01-16 (×4): qty 1

## 2018-01-16 NOTE — Progress Notes (Signed)
[facility] PHYSICAL MEDICINE & REHABILITATION PROGRESS NOTE   Subjective/Complaints:  Up since 4am (what I do at home) , voice is stronger this am, very hungry, discussed lovenox vs sq heparin  ROS- no CP, SOB, N/V/D   Objective:   No results found. Recent Labs    01/15/18 1157 01/16/18 0507  WBC 10.5 13.3*  HGB 9.2* 8.4*  HCT 27.5* 27.1*  PLT 362 485*   Recent Labs    01/16/18 0507  NA 137  K 4.6  CL 96*  CO2 28  GLUCOSE 125*  BUN 13  CREATININE 1.13  CALCIUM 10.2    Intake/Output Summary (Last 24 hours) at 01/16/2018 0721 Last data filed at 01/15/2018 1905 Gross per 24 hour  Intake 240 ml  Output -  Net 240 ml     Physical Exam: Vital Signs Blood pressure (!) 125/92, pulse (!) 101, temperature 98.1 F (36.7 C), resp. rate 18, height 5\' 11"  (1.803 m), weight 59.7 kg, SpO2 100 %.  General: No acute distress Mood and affect are appropriate Heart: Regular rate and rhythm no rubs murmurs or extra sounds Lungs: Clear to auscultation, breathing unlabored, no rales or wheezes Abdomen: Positive bowel sounds, soft nontender to palpation, nondistended Extremities: No clubbing, cyanosis, or edema Skin: No evidence of breakdown, no evidence of rash Neurologic: Cranial nerves II through XII intact, motor strength is 4/5 in right deltoid, bicep, tricep, grip, hip flexor, knee extensors, ankle dorsiflexor and plantar flexor, 0/5 on left except 3+ at trap  Sensory exam normal sensation to light touch and proprioception in bilateral upper and lower extremities Cerebellar exam normal finger to nose to finger as well as heel to shin in bilateral upper and lower extremities Musculoskeletal: Full range of motion in all 4 extremities. No joint swelling    Assessment/Plan: 1. Functional deficits secondary to Left hemiparesis and left neglect Right pontine and occipital infarcts which require 3+ hours per day of interdisciplinary therapy in a comprehensive inpatient rehab  setting.  Physiatrist is providing close team supervision and 24 hour management of active medical problems listed below.  Physiatrist and rehab team continue to assess barriers to discharge/monitor patient progress toward functional and medical goals  Care Tool:  Bathing              Bathing assist       Upper Body Dressing/Undressing Upper body dressing        Upper body assist      Lower Body Dressing/Undressing Lower body dressing            Lower body assist       Toileting Toileting    Toileting assist Assist for toileting: Minimal Assistance - Patient > 75%     Transfers Chair/bed transfer  Transfers assist     Chair/bed transfer assist level: Moderate Assistance - Patient 50 - 74%     Locomotion Ambulation   Ambulation assist              Walk 10 feet activity   Assist           Walk 50 feet activity   Assist           Walk 150 feet activity   Assist           Walk 10 feet on uneven surface  activity   Assist           Wheelchair     Assist  Wheelchair 50 feet with 2 turns activity    Assist            Wheelchair 150 feet activity     Assist          Medical Problem List and Plan: 1. 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 DVT Prophylaxis/Anticoagulation: Pharmaceutical:Lovenox instead of heparin, to reduce needle sticks and increase efficacy in post stroke population3. Pain Management:tylenol prn 4. Mood:Denies anxiety or depression. Monitor for now. LCSW to follow for evaluation and support. 5. Neuropsych: This patientis not fullycapable of making decisions on hisown behalf. 6. Skin/Wound Care:routine pressure relief measures. 7. Fluids/Electrolytes/Nutrition:Monitor I/O. Check lytes in am. 8. MCE:YEMVVKP BID. Continue metoprolol and  catapres. Vitals:   01/15/18 2109 01/16/18 0433  BP: 120/89 (!) 125/92  Pulse: (!) 115 (!) 101  Resp: 17 18  Temp: 98.1 F (36.7 C)   SpO2: 100% 100%  Mild tachy but BP normal,  9. Pancytopenia:Has resolved post Granix--d/c neutropenic precautions.Leukocytosis may be related to granix use but given incontinence and retention check UA 10. Prediabetes: Hgb A1c- 6.4. Monitor BS ac/hs and use SSI for elevated BS 11. Testicular cancer:Dr. Magrinatrecommends no further chemo and monitoring AFP/HCG every 6 months X 2 years.  12. Hypertriglyceridemia:On lipitor. 13. Depression: Was on Zyprexa for mood stabilization. May consider SSRI vs trazodone  (may help with excess drooling)   LOS: 1 days A FACE TO FACE EVALUATION WAS PERFORMED  [name] 01/16/2018, 7:21 AM

## 2018-01-16 NOTE — Progress Notes (Signed)
Social Work  Social Work Assessment and Plan  Patient Details  Name: Caleb Hubbard MRN: [id number] Date of Birth: 1981/02/11  Today's Date: 01/16/2018  Problem List:  Patient Active Problem List   Diagnosis Date Noted  . 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 Cancer ([place])   . Endotracheally intubated   . 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 Anxiety   . Cancer ([place])   . Headache   . Hypertension   . Testicular mass    Past Surgical History:  Past Surgical History:  Procedure Laterality Date  . IR ANGIO INTRA EXTRACRAN SEL COM CAROTID INNOMINATE BILAT MOD SED  01/03/2018  . IR ANGIO VERTEBRAL SEL VERTEBRAL UNI L MOD SED  01/03/2018  . IR CT HEAD LTD  01/03/2018  . IR INTRA CRAN STENT  01/03/2018  . IR PERCUTANEOUS ART THROMBECTOMY/INFUSION INTRACRANIAL INC DIAG ANGIO  01/03/2018  . NO PAST SURGERIES    . ORCHIECTOMY Right 06/17/2017   Procedure: RIGHT RADICAL ORCHIECTOMY;  Surgeon: Ceasar Mons, MD;  Location: [facility];  Service: Urology;  Laterality: Right;  . RADIOLOGY WITH ANESTHESIA N/A 01/03/2018   Procedure: CODE STROKE;  Surgeon: Luanne Bras, MD;  Location: [facility];  Service: Radiology;  Laterality: N/A;   Social History:  reports that he has been smoking cigars. He has never used smokeless tobacco. He reports that he has current or past drug history. Drug: Marijuana. He reports that he does not drink alcohol.  Family / Support Systems Marital Status: Single Patient Roles: Partner, Parent, Other (Comment)(son) Spouse/Significant Other: Tammy Cole-girlfriend [phone] Children: 52 yo son with Tammy Other Supports: Larlotta-Mom [phone]   Anticipated Caregiver: Parents Ability/Limitations of Caregiver: Tammy has medical issues-recent TKR and they live on the third floor, parents both work but will come up with a plan Caregiver Availability:  Other (Comment)(Parents coming up with a plan) Family Dynamics: Close knit with Tammy and his parents. Parents plan on taking him home to their home upon discharge from Rehab. He has friends and co-workers who are supportive and involved  Social History Preferred language: English Religion: Christian Cultural Background: No issues Education: High School Read: Yes Write: Yes Employment Status: Unemployed Date Retired/Disabled/Unemployed: Hasn't worked since chemo Nov 2019 Legal History/Current Legal Issues: No issues Guardian/Conservator: None-according to MD pt is not fully capable of making his own decisions while here. Will look toward his parents since they are his next of kin. He is no married and his son is a minor.   Abuse/Neglect Abuse/Neglect Assessment Can Be Completed: Yes Physical Abuse: Denies Verbal Abuse: Denies Sexual Abuse: Denies Exploitation of patient/patient's resources: Denies Self-Neglect: Denies  Emotional Status Pt's affect, behavior and adjustment status: Pt is motivated to do well, but is feeling discouraged and somewhat depressed, since this happened. He was delaing with one thing-cancer now this. 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 Have made referral to neuro-pyshc to see Monday. Recent Psychosocial Issues: recent cancer diagnosis and checmo for this along with surgery 06/2017 Psychiatric History: has been depressed since cancer diagnosis, would benefit from a medication and seeing neuro-psych while here. He is struggling and it shows. Will provide support and work with on coping mechanisms, along with neuro-psych following Substance Abuse History: THC aware better if he quits, doesn't think it is a issue  Patient / Family  Perceptions, Expectations & Goals Pt/Family understanding of illness & functional limitations: Pt and Mom can explain his stroke and deficits. He is having visual issues  and this bothers him along with his hemiplegia. He and MOm have spoken with the MD and feel they have a good understanding of his treatment plan going forward.  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, father, son, employee, cancer pt, etc Anticipated changes in roles/activities/participation: resume Pt/family expectations/goals: Pt states: " I want to do well here."  Mom states: " I hope he makes good progress here, he is a Scientist, research (physical sciences)." Tammy states: " We want him to get well and will help him in anyway we can."  [employer]: Other (Comment)([facility]) Premorbid Home Care/DME Agencies: None Transportation available at discharge: Friends and [employer] referrals recommended: Neuropsychology, Support group (specify)  Discharge Planning Living Arrangements: Spouse/significant other, Children Support Systems: Spouse/significant other, Children, Parent, Friends/neighbors Type of Residence: Private residence Insurance Resources: Self-pay(Appl;ying for [employer] has [employer]  currently) Museum/gallery curator Resources: Other (Comment)(recnetly unemployed-applied for SSD) Financial Screen Referred: Previously completed Living Expenses: Rent Money Management: Patient, Significant Other Does the patient have any problems obtaining your medications?: Yes (Describe)(uninsured) Home Management: Both he and tammy Patient/Family Preliminary Plans: Plans to go to parent's home, he and Tammy live on the third floor and is not sure if can do steps. Tammy is still recovering from TKR and can not physically assist him. She is on disability herself. Parent's plan to work on a discharge plan. Sw Barriers to Discharge: Inaccessible home environment Sw Barriers to Discharge Comments: Lives on 3rd floor-plans to go to parent's Social Work Anticipated Follow Up Needs: HH/OP, Support Group  Clinical Impression Pleasant gentleman who is discouraged can see in his affect and  facial expressions. He is willing to work and make progress here. Parent's and Tammy are invovled and supportive. Mom looking into Medicaid and his disability application is pending from previous cancer diagnosis. Neuro-psych to see on Monday and will work on safe planfor him. Will ask financial counselor to assist with medicaid application while here.  Elease Hashimoto 01/16/2018, 2:29 PM

## 2018-01-16 NOTE — Progress Notes (Signed)
   01/16/18 2006  What Happened  Was fall witnessed? No  Was patient injured? No  Patient found on floor  Found by Staff-comment  Stated prior activity to/from bed, chair, or stretcher  Follow Up  MD notified Yes  (Dr. Read Drivers notified )  Time MD notified 2015  Family notified Yes-comment  Time family notified 2031  Additional tests No  Simple treatment Other (comment) (Telesitter, Floor, moved close to Nsg station)  Progress note created (see row info) Yes  Adult Fall Risk Assessment  Risk Factor Category (scoring not indicated) High fall risk per protocol (document High fall risk)  Age 37  Fall History: Fall within 6 months prior to admission 5  Elimination; Bowel and/or Urine Incontinence 2  Elimination; Bowel and/or Urine Urgency/Frequency 2  Medications: includes PCA/Opiates, Anti-convulsants, Anti-hypertensives, Diuretics, Hypnotics, Laxatives, Sedatives, and Psychotropics 3  Patient Care Equipment 2  Mobility-Assistance 2  Mobility-Gait 2  Mobility-Sensory Deficit 2  Altered awareness of immediate physical environment 1  Impulsiveness 2  Lack of understanding of one's physical/cognitive limitations 4  Total Score 27  Patient Fall Risk Level High fall risk  Adult Fall Risk Interventions  Required Bundle Interventions *See Row Information* High fall risk - low, moderate, and high requirements implemented  Additional Interventions Camera surveillance (with patient/family notification & education);Other (Comment) (floor mats, move close to nsg station )  Screening for Fall Injury Risk (To be completed on HIGH fall risk patients) - Assessing Need for Low Bed  Risk For Fall Injury- Low Bed Criteria TeleSitter Camera in use  Will Implement Low Bed and Floor Mats Yes  Screening for Fall Injury Risk (To be completed on HIGH fall risk patients who do not meet crieteria for Low Bed) - Assessing Need for Floor Mats Only  Risk For Fall Injury- Criteria for Floor Mats Noncompliant  with safety precautions  Will Implement Floor Mats Yes  Vitals  Temp 98.2 F (36.8 C)  BP (!) 130/96  MAP (mmHg) 107  BP Location Right Arm  BP Method Automatic  Patient Position (if appropriate) Lying  Pulse Rate (!) 109  Pulse Rate Source Monitor  Resp 20  Oxygen Therapy  SpO2 100 %  O2 Device Room Air  Neurological  Neuro ([facility]) X (basilar artery embolism )  Level of Consciousness Alert  Orientation Level Oriented X4  Cognition Poor judgement;Poor safety awareness  Speech Slurred/Dysarthria  Pupil Assessment  No  Additional Pupil Assessments No  Motor Function/Sensation Assessment Grip;Motor response;Sensation;Motor strength  L Hand Grip Absent

## 2018-01-16 NOTE — Progress Notes (Signed)
Orthopedic Tech Progress Note Patient Details:  Caleb Hubbard 1980-09-03 [id number]  Patient ID: Caleb Hubbard, male   DOB: 1980/05/26, 37 y.o.   MRN: [id number]   Caleb Hubbard 01/16/2018, 10:23 AMCalled [facility] for left resting hand splint and AFO boot.

## 2018-01-16 NOTE — Evaluation (Signed)
Occupational Therapy Assessment and Plan  Patient Details  Name: Caleb Hubbard MRN: [id number] Date of Birth: 11-Jan-1981  OT Diagnosis: abnormal posture, cognitive deficits, disturbance of vision, hemiplegia affecting non-dominant side and muscle weakness (generalized) Rehab Potential:   ELOS: 3-4 weeks   Today's Date: 01/16/2018 OT Individual Time: 3419-6222 OT Individual Time Calculation (min): 78 min     Problem List:  Patient Active Problem List   Diagnosis Date Noted  . 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 Cancer ([place])   . Endotracheally intubated   . 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 Anxiety   . Cancer ([place])   . Headache   . Hypertension   . Testicular mass    Past Surgical History:  Past Surgical History:  Procedure Laterality Date  . IR ANGIO INTRA EXTRACRAN SEL COM CAROTID INNOMINATE BILAT MOD SED  01/03/2018  . IR ANGIO VERTEBRAL SEL VERTEBRAL UNI L MOD SED  01/03/2018  . IR CT HEAD LTD  01/03/2018  . IR INTRA CRAN STENT  01/03/2018  . IR PERCUTANEOUS ART THROMBECTOMY/INFUSION INTRACRANIAL INC DIAG ANGIO  01/03/2018  . NO PAST SURGERIES    . ORCHIECTOMY Right 06/17/2017   Procedure: RIGHT RADICAL ORCHIECTOMY;  Surgeon: Ceasar Mons, MD;  [facility];  Service: Urology;  Laterality: Right;  . 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 Presents on 11/23 with nausea, altered mental status, dystonic posturing.  Found to have b/l cerebellar, brainstem and occipital infarcts from basilar artery occlusion and had  revascularization with neuro IR.   Vented 11/23  .    Patient currently requires mod- max with basic self-care skills secondary to muscle weakness, decreased cardiorespiratoy endurance, impaired timing and sequencing, abnormal tone, unbalanced muscle activation and decreased coordination, decreased visual acuity and decreased visual motor skills, decreased attention to left, decreased attention, decreased awareness, decreased problem solving, decreased safety awareness, decreased memory and delayed processing and decreased sitting balance, decreased standing balance, decreased postural control, hemiplegia and decreased balance strategies.  Prior to hospitalization, patient could complete BADL/IADL with independent .  Patient will benefit from skilled intervention to decrease level of assist with basic self-care skills and increase independence with basic self-care skills prior to discharge home with care partner.  Anticipate patient will require 24 hour supervision and minimal physical assistance and follow up home health and follow up outpatient.  OT - End of Session Activity Tolerance: Tolerates 30+ min activity with multiple rests Endurance Deficit: Yes OT Assessment Rehab Potential (ACUTE ONLY): Good OT Barriers to Discharge: Inaccessible home environment OT Patient demonstrates impairments in the following area(s): Balance;Cognition;Endurance;Motor;Perception;Safety;Sensory;Vision OT Basic ADL's Functional Problem(s): Eating;Grooming;Bathing;Dressing;Toileting OT Advanced ADL's Functional Problem(s): Simple Meal Preparation OT Transfers Functional Problem(s): Toilet;Tub/Shower OT Additional Impairment(s): Fuctional Use of Upper Extremity OT Plan OT Intensity: Minimum of 1-2 x/day, 45 to 90 minutes OT Frequency: 5 out of 7 days OT Duration/Estimated Length of Stay: 3-4 weeks OT Treatment/Interventions: Balance/vestibular training;Discharge planning;Functional electrical stimulation;Pain  management;Self Care/advanced ADL retraining;Therapeutic Activities;UE/LE Coordination activities;Cognitive remediation/compensation;Disease mangement/prevention;Functional mobility training;Patient/family education;Skin care/wound managment;Visual/perceptual remediation/compensation;Therapeutic Exercise;Community reintegration;DME/adaptive equipment instruction;Neuromuscular  re-education;Psychosocial support;Splinting/orthotics;UE/LE Strength taining/ROM;Wheelchair propulsion/positioning OT Self Feeding Anticipated Outcome(s): S OT Basic Self-Care Anticipated Outcome(s): S UB dressing; MIN A LB dressing OT Toileting Anticipated Outcome(s): CGA OT Bathroom Transfers Anticipated Outcome(s): CGA toilet; S shower OT Recommendation Recommendations for Other Services: Therapeutic Recreation consult;Neuropsych consult Therapeutic Recreation Interventions: Pet therapy;Outing/community reintergration Patient destination: Home Follow Up Recommendations: Home health OT;Outpatient OT Equipment Recommended: Tub/shower seat;Tub/shower bench;To be determined;3 in 1 bedside comode   Skilled Therapeutic Intervention 1:1. Pt educated on role/purpose of OT, CIR, ELOS and POC. Pt completes bed mobility/transfers with MOD A and A for LLE management and knee block. Pt completes stand pivot transfers with knee block, MOD A and VC for hand placement. Pt able to wash at sink with A to wash buttocks, back and RUE with HOH A for LUE for NMR. Pt completes UB dressing with MOD A for threading LUE and pulling shirt down back. Pt sit to stand and able to advance pants past hips with MOD A for standing balance after OT threads LLE and dons B non slip socks. Pt demo trace shoulder elevation with bathing/HOH reaching in ADL. Pt compeltes toileting with MAX A for clothing management for continent bladder void in toilet. Exited session with pt seated in w/c call light inreach and all needs met  OT Evaluation Precautions/Restrictions   Precautions Precautions: Fall Precaution Comments: corretrack Restrictions Weight Bearing Restrictions: No General Chart Reviewed: Yes Vital Signs Therapy Vitals Pulse Rate: (!) 101 Resp: 18 BP: (!) 125/92 Patient Position (if appropriate): Lying Oxygen Therapy SpO2: 100 % O2 Device: Room Air Pain Pain Assessment Pain Score: 0-No pain Home Living/Prior Functioning Home Living Family/patient expects to be discharged to:: Private residence Living Arrangements: Spouse/significant other Available Help at Discharge: Family Type of Home: House Home Access: Stairs to enter Technical brewer of Steps: 3 Entrance Stairs-Rails: None Home Layout: Two level, Able to live on main level with bedroom/bathroom Bathroom Shower/Tub: Multimedia programmer: Standard Additional Comments: lives with significant other, but home setup is for his parents house in [place] as his mother would like to take him home.   Lives With: Family IADL History Homemaking Responsibilities: Yes Meal Prep Responsibility: Secondary Laundry Responsibility: Secondary Cleaning Responsibility: Secondary Bill Paying/Finance Responsibility: Secondary Shopping Responsibility: Secondary Child Care Responsibility: Secondary Homemaking Comments: family would be providing A at d/c Current License: Yes Mode of Transportation: Car Occupation: On disability Leisure and Hobbies: video games Prior Function Vocation: Unemployed Comments: driving, not working  ADL ADL Eating: Moderate cueing Where Assessed-Eating: Wheelchair Upper Body Bathing: Minimal assistance Where Assessed-Upper Body Bathing: Sitting at sink Lower Body Bathing: Moderate assistance Where Assessed-Lower Body Bathing: Wheelchair Upper Body Dressing: Moderate assistance Lower Body Dressing: Maximal assistance Toileting: Maximal assistance Where Assessed-Toileting: Glass blower/designer: Moderate assistance Toilet Transfer  Equipment: Grab bars, Raised toilet seat Vision Baseline Vision/History: Wears glasses Vision Assessment?: Vision impaired- to be further tested in functional context Additional Comments: reports double vision improveing with R eye covered, R eye rotated inward Perception  Perception: Impaired(mild L body inattention) Praxis Praxis: Intact Cognition Overall Cognitive Status: Impaired/Different from baseline Arousal/Alertness: Awake/alert Orientation Level: Person;Place;Situation Person: Oriented Place: Oriented Situation: Oriented Year: 2019 Month: December Day of Week: Correct Memory: Impaired Memory Impairment: Retrieval deficit;Decreased recall of new information;Decreased short term memory Immediate Memory Recall: Sock;Blue;Bed Memory Recall: Sock;Blue;Bed Memory Recall Sock: Without Cue Memory Recall Blue: Without Cue Memory Recall Bed: Without Cue Problem Solving: Impaired Safety/Judgment: Impaired Sensation   light touch WFL Motor  Motor Motor: Hemiplegia  Motor - Skilled Clinical Observations: dense L hemiplegia Mobility  Transfers Sit to Stand: Moderate Assistance - Patient 50-74%  Trunk/Postural Assessment  Cervical Assessment Cervical Assessment: Within Functional Limits Thoracic Assessment Thoracic Assessment: Within Functional Limits Lumbar Assessment Lumbar Assessment: Within Functional Limits Postural Control Postural Control: Deficits on evaluation(delayed/absent)  Balance Balance Balance Assessed: Yes Dynamic Sitting Balance Sitting balance - Comments: occasional minA for sitting balance, overall requires use of single UE support; attempted dynamic reaching activity however pt initiating return to supine with attempts (reports due to fatigue)  Dynamic Standing Balance Dynamic Standing - Level of Assistance: 3: Mod assist Dynamic Standing - Comments: standing to advance pants past hips Extremity/Trunk Assessment RUE Assessment RUE Assessment:  Within Functional Limits LUE Assessment LUE Assessment: Exceptions to [facility] General Strength Comments: trace shoulder elevation,     Refer to Care Plan for Long Term Goals  Recommendations for other services: Neuropsych and Therapeutic Recreation  Pet therapy and Outing/community reintegration   Discharge Criteria: Patient will be discharged from OT if patient refuses treatment 3 consecutive times without medical reason, if treatment goals not met, if there is a change in medical status, if patient makes no progress towards goals or if patient is discharged from hospital.  The above assessment, treatment plan, treatment alternatives and goals were discussed and mutually agreed upon: by patient  Tonny Branch 01/16/2018, 7:58 AM

## 2018-01-16 NOTE — Care Management (Signed)
Inpatient Rehabilitation Center Individual Statement of Services  Patient Name:  Caleb Hubbard  Date:  01/16/2018  Welcome to the [facility].  Our goal is to provide you with an individualized program based on your diagnosis and situation, designed to meet your specific needs.  With this comprehensive rehabilitation program, you will be expected to participate in at least 3 hours of rehabilitation therapies Monday-Friday, with modified therapy programming on the weekends.  Your rehabilitation program will include the following services:  Physical Therapy (PT), Occupational Therapy (OT), Speech Therapy (ST), 24 hour per day rehabilitation nursing, Therapeutic Recreaction (TR), Neuropsychology, Case Management (Social Worker), Rehabilitation Medicine, Nutrition Services and Pharmacy Services  Weekly team conferences will be held on Wednesday to discuss your progress.  Your Social Worker will talk with you frequently to get your input and to update you on team discussions.  Team conferences with you and your family in attendance may also be held.  Expected length of stay: 21-24 days  Overall anticipated outcome: supervision-min assist level  Depending on your progress and recovery, your program may change. Your Social Worker will coordinate services and will keep you informed of any changes. Your Social Worker's name and contact numbers are listed  below.  The following services may also be recommended but are not provided by the [facility] will be made to provide these services after discharge if needed.  Arrangements include referral to agencies that provide these services.  Your insurance has been verified to be:  Uninsured-applying for Medicaid Your primary doctor is:    Pertinent information will be shared with your doctor and your  insurance company.  Social Worker:  Ovidio Kin, [place] or ([phone]  Information discussed with and copy given to patient by: Elease Hashimoto, 01/16/2018, 9:40 AM

## 2018-01-16 NOTE — Progress Notes (Signed)
Initial Nutrition Assessment  DOCUMENTATION CODES:   Not applicable  INTERVENTION:   - Continue ProStat BID (each provides 100 kcal and 15 g protein) - Add MVI with minerals daily   NUTRITION DIAGNOSIS:   Inadequate oral intake related to acute illness as evidenced by energy intake < 75% for > 7 days, estimated needs.  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  Now s/p 1 of 2 planned cycles of cisplatin / etoposide (12/19/17 through 01/01/18 at [facility]). 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    11/26 extubated  11/27 Cortrak, tip post pyloric per Cortrak team 12/5 transferred to rehab  Labs: chloride 96, glucose 125, ALP 139 (H), Hgb 8.4, Hct 27.1% Meds: Lipitor 80 mg daily, ProStat BID, novolog TID with meals/at bedtime, Protonix 40 mg daily  Pt resting at time of visit. Able to answer yes/no questions.  Pt indicated that 131# is a normal wt and denied any recent wt loss.  Typically, pt eats 2 meals daily and does not follow any special diet. Does not take MVI at home. Pt breakfast tray in room - eggs, sausage, milk (75% complete).  Denies nausea or vomiting, diarrhea or constipation.  Encouraged pt to include protein-rich foods with every meal. Continue with ProStat BID.  NUTRITION - FOCUSED PHYSICAL EXAM:   Most Recent Value  Orbital Region  No depletion  Upper Arm Region  No depletion  Thoracic and Lumbar Region  No depletion  Buccal Region  No depletion  Temple Region  Mild depletion  Clavicle Bone Region  No depletion  Clavicle and Acromion Bone Region  Mild depletion  Scapular Bone Region  No  depletion  Dorsal Hand  No depletion  Patellar Region  No depletion  Anterior Thigh Region  No depletion  Posterior Calf Region  No depletion  Edema (RD Assessment)  None  Hair  Reviewed  Eyes  Reviewed  Mouth  Reviewed  Skin  Reviewed  Nails  Reviewed      Diet Order:  75% of last 2 meals completed, per nsg documentation Diet Order            DIET DYS 2 Room service appropriate? Yes; Fluid consistency: Nectar Thick  Diet effective now              EDUCATION NEEDS:  No education needs have been identified at this time  Skin:  Skin Assessment: Reviewed RN Assessment  Last BM:  12/4, type 6  Height:  Ht Readings from Last 1 Encounters:  01/15/18 5\' 11"  (1.803 m)    Weight:  Wt Readings from Last 1 Encounters:  01/16/18 59.7 kg    Ideal Body Weight:  78.1 kg  BMI:  Body mass index is 18.36 kg/m.  Estimated Nutritional Needs:   Kcal:  2016 calories daily (MSJ x 1.3)  Protein:  94-117 gm daily (1.2-1.5 g/kg IBW)  Fluid:  >/= 2L daily or per MD discretion  Althea Grimmer, MS, RDN, LDN Pager: [phone]

## 2018-01-16 NOTE — Progress Notes (Signed)
Physical Therapy Assessment and Plan  Patient Details  Name: Caleb Hubbard MRN: [id number] Date of Birth: 1981-01-14  PT Diagnosis: Hemiparesis non-dominant, Impaired cognition, Impaired sensation and Muscle weakness Rehab Potential: Good ELOS: 21-24 days   Today's Date: 01/16/2018 PT Individual Time: 1400-1500 PT Individual Time Calculation (min): 60 min    Problem List:  Patient Active Problem List   Diagnosis Date Noted  . 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 Cancer ([place])   . Endotracheally intubated   . Stroke (cerebrum) ([place]) 01/03/2018  . 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 Anxiety   . Cancer ([place])   . Headache   . Hypertension   . Testicular mass    Past Surgical History:  Past Surgical History:  Procedure Laterality Date  . IR ANGIO INTRA EXTRACRAN SEL COM CAROTID INNOMINATE BILAT MOD SED  01/03/2018  . IR ANGIO VERTEBRAL SEL VERTEBRAL UNI L MOD SED  01/03/2018  . IR CT HEAD LTD  01/03/2018  . IR INTRA CRAN STENT  01/03/2018  . IR PERCUTANEOUS ART THROMBECTOMY/INFUSION INTRACRANIAL INC DIAG ANGIO  01/03/2018  . NO PAST SURGERIES    . ORCHIECTOMY Right 06/17/2017   Procedure: RIGHT RADICAL ORCHIECTOMY;  Surgeon: Ceasar Mons, MD;  Location: [facility];  Service: Urology;  Laterality: Right;  . RADIOLOGY WITH ANESTHESIA N/A 01/03/2018   Procedure: CODE STROKE;  Surgeon: Luanne Bras, MD;  Location: [place];  Service: Radiology;  Laterality: N/A;    Assessment & Plan Clinical Impression:  Caleb Hubbard is a72 year old male with history of HTN, situational depression, testicular cancer S/B radical orchiectomy, recent retroperitoneal lymph node dissection with initiation of chemo 12/29/2017;who was admitted on 11/23 with confusion, dystonic posturing and concerns of seizure activity. UDS positive for THC. CT head done showing patchy hypodensity in bilateral cerebellum and occipital lobe  suggestive of acute infarcts versus PR ES. 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 Terrilee Files. Follow-up MRI of brain showed numerous supra and infratentorial tentorial posterior circulation nonhemorrhagic infarcts with mild cytotoxic edema. Blood cultures x2 done due to leukocytosis and were negative. BLE Dopplers negative for DVT. TEE showed no cardiac source of emboli, no ASD or PFO and moderate LVH with EF of 65 to 70%.  He developed pancytopenia with drop in white count to 0.5 and was treated with Granix daily with improvement in Caleb Hubbard. Blood pressures have been labile and medications titrated for better control. Heme-onc did not feel that hypercoagulable state was responsible for stroke.Dr. Leonie Man felt that stroke embolic due to unclear service possible underlying basilar stenosis versus hypertension versus smoking/THC use.He is to continue on low-dose ASA as well as Brilinta twice daily due to stent. MBS done for swallow eval and patient started on dysphagia to nectar's.He has had issues with tachycardia and CTA chest negative for PE.Dr. Jana Hakim felt that patient's cancer prognosis was good and does not recommend proceeding with further chemotherapy. Patient with resultant left hemiplegia, dysphagia, aphasia and cognitive deficitsaffecting overall functional status. CIR was recommended for further therapy. Patient transferred to CIR on 01/15/2018 .   Patient currently requires mod with mobility secondary to muscle weakness and muscle paralysis, unbalanced muscle activation, decreased coordination and decreased motor planning, decreased attention to left, decreased awareness, decreased problem solving, decreased safety  awareness and decreased memory and decreased  sitting balance, decreased standing balance, decreased postural control, hemiplegia and decreased balance strategies.  Prior to hospitalization, patient was independent  with mobility and lived with Family in a House home.  Home access is 3Stairs to enter.  Patient will benefit from skilled PT intervention to maximize safe functional mobility, minimize fall risk and decrease caregiver burden for planned discharge home with 24 hour assist.  Anticipate patient will benefit from follow up [facility] at discharge.  PT - End of Session Activity Tolerance: Tolerates 10 - 20 min activity with multiple rests Endurance Deficit: Yes Endurance Deficit Description: needs frequent rest breaks during session PT Assessment Rehab Potential (ACUTE/IP ONLY): Good PT Barriers to Discharge: Medical stability;Home environment access/layout PT Patient demonstrates impairments in the following area(s): Balance;Endurance;Motor;Perception;Safety;Sensory PT Transfers Functional Problem(s): Bed Mobility;Bed to Chair;Car;Furniture;Floor PT Locomotion Functional Problem(s): Ambulation;Wheelchair Mobility;Stairs PT Plan PT Intensity: Minimum of 1-2 x/day ,45 to 90 minutes PT Frequency: 5 out of 7 days PT Duration Estimated Length of Stay: 21-24 days PT Treatment/Interventions: Ambulation/gait training;Balance/vestibular training;Cognitive remediation/compensation;Community reintegration;Discharge planning;Disease management/prevention;DME/adaptive equipment instruction;Functional electrical stimulation;Functional mobility training;Neuromuscular re-education;Pain management;Patient/family education;Psychosocial support;Splinting/orthotics;Stair training;Therapeutic Activities;Therapeutic Exercise;UE/LE Strength taining/ROM;UE/LE Coordination activities;Visual/perceptual remediation/compensation;Wheelchair propulsion/positioning PT Transfers Anticipated Outcome(s): CGA PT Locomotion Anticipated Outcome(s): CGA with LRAD PT  Recommendation Recommendations for Other Services: Neuropsych consult;Therapeutic Recreation consult Therapeutic Recreation Interventions: Stress management;Outing/community reintergration Follow Up Recommendations: Home health PT Patient destination: Home Equipment Recommended: To be determined Equipment Details: TBD pending progress  Skilled Therapeutic Intervention Evaluation completed (see details above and below) with education on PT POC and goals and individual treatment initiated with focus on functional transfers and equipment setup. Pt received seated in bed, request to use the bathroom. Supine to sit with min A with use of bedrail and skilled cueing for transfer technique. Stand pivot transfer bed to w/c with mod A to the R with L knee blocked. Stand pivot transfer w/c to commode over toilet seat with mod A to the L, L knee blocked. Sit to stand with mod A for dependent management of pants and brief. Pt unable to void once seated on toilet. Squat pivot transfer back to w/c with mod A. Set pt up with 16x16 w/c, cushion, and L arm support. Pt requested to return to bed at end of therapy session. Squat pivot transfer w/c to bed with mod A with L knee blocked and multimodal cueing for positioning with transfer. Sit to supine min A. Pt left supine in bed with needs in reach, bed alarm in place.  PT Evaluation Precautions/Restrictions Precautions Precautions: Fall Precaution Comments: corretrack Restrictions Weight Bearing Restrictions: No Pain Pain Assessment Pain Scale: 0-10 Pain Score: 0-No pain Home Living/Prior Functioning Home Living Living Arrangements: Spouse/significant other;Children Available Help at Discharge: Family Type of Home: House Home Access: Stairs to enter [employer] of Steps: 3 Entrance Stairs-Rails: None Home Layout: Two level;Able to live on main level with bedroom/bathroom  Lives With: Family Prior Function Level of Independence: Independent  with gait;Independent with transfers  Able to Take Stairs?: Yes Driving: Yes Vocation: On disability(due to cancer diagnosis) Vision/Perception  Vision - History Baseline Vision: Wears glasses all the time Patient Visual Report: (reports change from baseline but unable to describe) Perception Perception: Impaired(mild inattention to L hemibody) Praxis Praxis: Intact  Cognition Overall Cognitive Status: Impaired/Different from baseline Arousal/Alertness: Awake/alert Orientation Level: Oriented X4 Attention: Selective Selective Attention: Impaired Selective Attention Impairment: Verbal basic;Functional basic Memory: Impaired Memory Impairment: Retrieval deficit;Decreased recall of new information;Decreased short term  memory Decreased Short Term Memory: Verbal basic;Functional basic Awareness: Impaired Awareness Impairment: Emergent impairment Problem Solving: Impaired Problem Solving Impairment: Verbal complex;Functional complex Executive Function: Reasoning;Decision Making Reasoning: Impaired Reasoning Impairment: Verbal complex;Functional complex Decision Making: Impaired Decision Making Impairment: Verbal complex;Functional complex Behaviors: Restless;Impulsive Safety/Judgment: Impaired Sensation Sensation Light Touch: Appears Intact Proprioception: Impaired by gross assessment(impaired in LLE) Coordination Gross Motor Movements are Fluid and Coordinated: No Fine Motor Movements are Fluid and Coordinated: No Coordination and Movement Description: limited by L hemiplegia Heel Shin Test: unable to assess L to R Motor  Motor Motor: Hemiplegia Motor - Skilled Clinical Observations: dense L hemiplegia  Mobility Bed Mobility Bed Mobility: Rolling Right;Rolling Left;Supine to Sit;Sit to Supine Rolling Right: Minimal Assistance - Patient > 75% Rolling Left: Minimal Assistance - Patient > 75% Supine to Sit: Minimal Assistance - Patient > 75% Sit to Supine: Minimal  Assistance - Patient > 75% Transfers Transfers: Sit to [employer] Transfers Sit to Stand: Moderate Assistance - Patient 50-74% Stand Pivot Transfers: Moderate Assistance - Patient 50 - 74% Stand Pivot Transfer Details: Manual facilitation for placement;Manual facilitation for weight shifting;Manual facilitation for weight bearing Stand Pivot Transfer Details (indicate cue type and reason): L knee blocked Transfer (Assistive device): 1 person hand held assist Locomotion  Stairs / Additional Locomotion Stairs: No Wheelchair Mobility Wheelchair Mobility: No  Trunk/Postural Assessment  Cervical Assessment Cervical Assessment: Within Functional Limits Thoracic Assessment Thoracic Assessment: Within Functional Limits Lumbar Assessment Lumbar Assessment: Exceptions to WFL(posterior pelvic tilt) Postural Control Postural Control: Deficits on evaluation(delayed/absent)  Balance Balance Balance Assessed: Yes Static Sitting Balance Static Sitting - Balance Support: Bilateral upper extremity supported;Feet supported Static Sitting - Level of Assistance: 5: Stand by assistance Dynamic Sitting Balance Dynamic Sitting - Balance Support: Bilateral upper extremity supported;Feet supported;During functional activity Dynamic Sitting - Level of Assistance: 4: Min Insurance risk surveyor Standing - Balance Support: Right upper extremity supported;During functional activity Static Standing - Level of Assistance: 3: Mod assist Dynamic Standing Balance Dynamic Standing - Balance Support: Right upper extremity supported;During functional activity Dynamic Standing - Level of Assistance: 3: Mod assist Extremity Assessment   RLE Assessment RLE Assessment: Within Functional Limits General Strength Comments: 5/5 grossly LLE Assessment LLE Assessment: Exceptions to [facility] General Strength Comments: trace hip flex, 0/5 knee and ankle    Refer to Care Plan for Long Term  Goals  Recommendations for other services: Neuropsych and Therapeutic Recreation  Stress management and Outing/community reintegration  Discharge Criteria: Patient will be discharged from PT if patient refuses treatment 3 consecutive times without medical reason, if treatment goals not met, if there is a change in medical status, if patient makes no progress towards goals or if patient is discharged from hospital.  The above assessment, treatment plan, treatment alternatives and goals were discussed and mutually agreed upon: by patient  Excell Seltzer, PT, DPT  01/16/2018, 5:06 PM

## 2018-01-16 NOTE — Progress Notes (Signed)
Inpatient Rehabilitation  Patient information reviewed and entered into eRehab system by Tacoya Altizer M. Zaleigh Bermingham, M.A., CCC/SLP, PPS Coordinator.  Information including medical coding, functional ability and quality indicators will be reviewed and updated through discharge.    
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
## 2018-01-17 ENCOUNTER — Inpatient Hospital Stay (HOSPITAL_COMMUNITY): Payer: Medicaid Other | Admitting: Physical Therapy

## 2018-01-17 ENCOUNTER — Inpatient Hospital Stay (HOSPITAL_COMMUNITY): Payer: Medicaid Other

## 2018-01-17 ENCOUNTER — Inpatient Hospital Stay (HOSPITAL_COMMUNITY): Payer: Medicaid Other | Admitting: Speech Pathology

## 2018-01-17 LAB — GLUCOSE, CAPILLARY
Glucose-Capillary: 110 mg/dL — ABNORMAL HIGH (ref 70–99)
Glucose-Capillary: 133 mg/dL — ABNORMAL HIGH (ref 70–99)
Glucose-Capillary: 98 mg/dL (ref 70–99)
Glucose-Capillary: 113 mg/dL — ABNORMAL HIGH (ref 70–99)

## 2018-01-17 NOTE — IPOC Note (Signed)
Overall Plan of Care [facility]) Patient Details Name: Caleb Hubbard MRN: [id number] DOB: 02-Jun-1980  Admitting Diagnosis: <principal problem not specified>  Hospital Problems: Active Problems:   Basilar artery embolism     Functional Problem List: Nursing Bladder, Bowel, Endurance, Medication Management, Motor, Skin Integrity, Sensory, Safety, Perception, Nutrition  PT Balance, Endurance, Motor, Perception, Safety, Sensory  OT Balance, Cognition, Endurance, Motor, Perception, Safety, Sensory, Vision  SLP Cognition, Linguistic, Nutrition  TR         Basic ADL's: OT Eating, Grooming, Bathing, Dressing, Toileting     Advanced  ADL's: OT Simple Meal Preparation     Transfers: PT Bed Mobility, Bed to Chair, Car, Furniture, Futures trader, Metallurgist: PT Ambulation, Emergency planning/management officer, Stairs     Additional Impairments: OT Fuctional Use of Upper Extremity  SLP Swallowing, Communication, Social Cognition expression Problem Solving, Memory, Attention, Awareness  TR      Anticipated Outcomes Item Anticipated Outcome  Self Feeding S  Swallowing  Mod I   Basic self-care  S UB dressing; MIN A LB dressing  Toileting  CGA   Bathroom Transfers CGA toilet; S shower  Bowel/Bladder  Min assist  Transfers  CGA  Locomotion  CGA with LRAD  Communication  Mod I  Cognition  Supervision  Pain  < 4  Safety/Judgment  Supervision   Therapy Plan: PT Intensity: Minimum of 1-2 x/day ,45 to 90 minutes PT Frequency: 5 out of 7 days PT Duration Estimated Length of Stay: 21-24 days OT Intensity: Minimum of 1-2 x/day, 45 to 90 minutes OT Frequency: 5 out of 7 days OT Duration/Estimated Length of Stay: 3-4 weeks SLP Intensity: Minumum of 1-2 x/day, 30 to 90 minutes SLP Frequency: 3 to 5 out of 7 days SLP Duration/Estimated Length of Stay: 3 to 4 weeks    Team Interventions: Nursing Interventions Patient/Family Education, Disease Management/Prevention, Skin  Care/Wound Management, Discharge Planning, Psychosocial Support, Bladder Management, Bowel Management, Medication Management, Dysphagia/Aspiration Precaution Training  PT interventions Ambulation/gait training, Training and development officer, Cognitive remediation/compensation, Community reintegration, Discharge planning, Disease management/prevention, DME/adaptive equipment instruction, Functional electrical stimulation, Functional mobility training, Neuromuscular re-education, Pain management, Patient/family education, Psychosocial support, Splinting/orthotics, Stair training, Therapeutic Activities, Therapeutic Exercise, UE/LE Strength taining/ROM, UE/LE Coordination activities, Visual/perceptual remediation/compensation, Wheelchair propulsion/positioning  OT Interventions Balance/vestibular training, Discharge planning, Functional electrical stimulation, Pain management, Self Care/advanced ADL retraining, Therapeutic Activities, UE/LE Coordination activities, Cognitive remediation/compensation, Disease mangement/prevention, Functional mobility training, Patient/family education, Skin care/wound managment, Visual/perceptual remediation/compensation, Therapeutic Exercise, Community reintegration, Engineer, drilling, Neuromuscular re-education, Psychosocial support, Splinting/orthotics, UE/LE Strength taining/ROM, Wheelchair propulsion/positioning  SLP Interventions Cognitive remediation/compensation, Dysphagia/aspiration precaution training, Internal/external aids, Speech/Language facilitation, Medication managment, Therapeutic Activities, Functional tasks, Patient/family education  TR Interventions    SW/CM Interventions Discharge Planning, Psychosocial Support, Patient/Family Education   Barriers to Discharge MD  Medical stability, Incontinence and Behavior  Nursing Home environment access/layout, Decreased caregiver support    PT Medical stability, Home environment access/layout    OT  Inaccessible home environment    SLP Decreased caregiver support    SW Inaccessible home environment Lives on 3rd floor-plans to go to parent's   Team Discharge Planning: Destination: PT-Home ,OT- Home , SLP-Home Projected Follow-up: PT-Home health PT, OT-  Home health OT, Outpatient OT, SLP-Outpatient SLP Projected Equipment Needs: PT-To be determined, OT- Tub/shower seat, Tub/shower bench, To be determined, 3 in 1 bedside comode, SLP-None recommended by SLP Equipment Details: PT-TBD pending progress, OT-  Patient/family involved in discharge planning: PT- Patient,  OT-Patient  unable/family or caregiver not available, SLP-Patient, Family member/caregiver  MD ELOS: 25-28d Medical Rehab Prognosis:  Good Assessment:  37 year old male with history of HTN, situational depression, testicular cancer S/B radical orchiectomy, recent retroperitoneal lymph node dissection with initiation of chemo 12/29/2017;who was admitted on 11/23 with confusion, dystonic posturing and concerns of seizure activity. UDS positive for THC. CT head done showing patchy hypodensity in bilateral cerebellum and occipital lobe suggestive of acute infarcts versus PR ES. 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 Terrilee Files. Follow-up MRI of brain showed numerous supra and infratentorial tentorial posterior circulation nonhemorrhagic infarcts with mild cytotoxic edema. Blood cultures x2 done due to leukocytosis and were negative. BLE Dopplers negative for DVT. TEE showed no cardiac source of emboli, no ASD or PFO and moderate LVH with EF of 65 to 70%.   Now requiring 24/7 Rehab RN,MD, as well as CIR level PT, OT and SLP.  Treatment team will focus on ADLs and mobility with goals set at [facility] See  Team Conference Notes for weekly updates to the plan of care

## 2018-01-17 NOTE — Progress Notes (Signed)
[place] PHYSICAL MEDICINE & REHABILITATION PROGRESS NOTE   Subjective/Complaints:  Mother at bedside no new issues overnight other than a fall no injury.  ROS- no CP, SOB, N/V/D   Objective:   No results found. Recent Labs    01/15/18 1157 01/16/18 0507  WBC 10.5 13.3*  HGB 9.2* 8.4*  HCT 27.5* 27.1*  PLT 362 485*   Recent Labs    01/16/18 0507  NA 137  K 4.6  CL 96*  CO2 28  GLUCOSE 125*  BUN 13  CREATININE 1.13  CALCIUM 10.2    Intake/Output Summary (Last 24 hours) at 01/17/2018 1346 Last data filed at 01/17/2018 0857 Gross per 24 hour  Intake 460 ml  Output 250 ml  Net 210 ml     Physical Exam: Vital Signs Blood pressure (!) 126/92, pulse 95, temperature 97.6 F (36.4 C), resp. rate 18, height 5\' 11"  (1.803 m), weight 60 kg, SpO2 100 %.  General: No acute distress Mood and affect are appropriate Heart: Regular rate and rhythm no rubs murmurs or extra sounds Lungs: Clear to auscultation, breathing unlabored, no rales or wheezes Abdomen: Positive bowel sounds, soft nontender to palpation, nondistended Extremities: No clubbing, cyanosis, or edema Skin: No evidence of breakdown, no evidence of rash Neurologic: Cranial nerves II through XII intact, motor strength is 4/5 in right deltoid, bicep, tricep, grip, hip flexor, knee extensors, ankle dorsiflexor and plantar flexor, 0/5 on left except 3+ at trap  Sensory exam normal sensation to light touch and proprioception in bilateral upper and lower extremities Cerebellar exam normal finger to nose to finger as well as heel to shin in bilateral upper and lower extremities Musculoskeletal: Full range of motion in all 4 extremities. No joint swelling    Assessment/Plan: 1. 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  Physiatrist is providing close  team supervision and 24 hour management of active medical problems listed below.  Physiatrist and rehab team continue to assess barriers to discharge/monitor patient progress toward functional and medical goals  Care Tool:  Bathing    Body parts bathed by patient: Left arm, Chest, Abdomen, Front perineal area, Right upper leg, Left upper leg, Face   Body parts bathed by helper: Buttocks, Right lower leg, Left lower leg, Right arm     Bathing assist Assist Level: Moderate Assistance - Patient 50 - 74%     Upper Body Dressing/Undressing Upper body dressing   What is the patient wearing?: Pull over shirt    Upper body assist Assist Level: Moderate Assistance - Patient 50 - 74%    Lower Body Dressing/Undressing Lower body dressing      What is the patient wearing?: Pants     Lower body assist Assist for lower body dressing: Moderate Assistance - Patient 50 - 74%     Toileting Toileting    Toileting assist Assist for toileting: Maximal Assistance - Patient 25 - 49%     Transfers Chair/bed transfer  Transfers assist     Chair/bed transfer assist level: Moderate Assistance - Patient 50 - 74%     Locomotion Ambulation   Ambulation assist   Ambulation activity did not occur: Safety/medical concerns          Walk 10 feet activity   Assist  Walk 10 feet activity did not occur: Safety/medical concerns        Walk  50 feet activity   Assist Walk 50 feet with 2 turns activity did not occur: Safety/medical concerns         Walk 150 feet activity   Assist Walk 150 feet activity did not occur: Safety/medical concerns         Walk 10 feet on uneven surface  activity   Assist Walk 10 feet on uneven surfaces activity did not occur: Safety/medical concerns         Wheelchair     Assist Will patient use wheelchair at discharge?: (TBD)   Wheelchair activity did not occur: Safety/medical concerns         Wheelchair 50 feet with 2  turns activity    Assist    Wheelchair 50 feet with 2 turns activity did not occur: Safety/medical concerns       Wheelchair 150 feet activity     Assist Wheelchair 150 feet activity did not occur: Safety/medical concerns        Medical Problem List and Plan: 1. 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 DVT Prophylaxis/Anticoagulation: Pharmaceutical:Lovenox instead of heparin, to reduce needle sticks and increase efficacy in post stroke population3. Pain Management:tylenol prn 4. Mood:Denies anxiety or depression. Monitor for now. LCSW to follow for evaluation and support. 5. Neuropsych: This patientis not fullycapable of making decisions on hisown behalf. 6. Skin/Wound Care:routine pressure relief measures. 7. Fluids/Electrolytes/Nutrition:Monitor I/O. Check lytes in am. 8. IDH:WYSHUOH BID. Continue metoprolol and catapres. Vitals:   01/17/18 0805 01/17/18 1031  BP: (!) 138/100 (!) 126/92  Pulse: 94 95  Resp: 18 18  Temp: 98.6 F (37 C) 97.6 F (36.4 C)  SpO2: 100% 100%  Blood pressure control this morning 9. Pancytopenia:Has resolved post Granix--d/c neutropenic precautions.Leukocytosis may be related to granix use but given incontinence and retention check UA 10. Prediabetes: Hgb A1c- 6.4. Monitor BS ac/hs and use SSI for elevated BS 11. Testicular cancer:Dr. Magrinatrecommends no further chemo and monitoring AFP/HCG every 6 months X 2 years.  12. Hypertriglyceridemia:On lipitor. 13. Depression: Was on Zyprexa for mood stabilization.  Now on trazodone  (may help with excess drooling)   LOS: 2 days A FACE TO FACE EVALUATION WAS PERFORMED  [name] 01/17/2018, 1:46 PM

## 2018-01-17 NOTE — Progress Notes (Signed)
Speech Language Pathology Daily Session Note  Patient Details  Name: Caleb Hubbard MRN: [id number] Date of Birth: [birth year]  Today's Date: 01/17/2018 SLP Individual Time: 4098-1191 SLP Individual Time Calculation (min): 45 min  Short Term Goals: Week 1: SLP Short Term Goal 1 (Week 1): Pt will utilize external memory aids to recall daily information with supervision cues.  SLP Short Term Goal 2 (Week 1): Pt will complex semi-complex problem solving tasks with Min A cues.  SLP Short Term Goal 3 (Week 1): Pt will demonstrate selective attention in mildly distracting environment for ~ 30 minutes with Min A cues.  SLP Short Term Goal 4 (Week 1): Pt will utilize speech intelligibility strategies to achieve ~ 90% speech intelligibility at the sentence level.  SLP Short Term Goal 5 (Week 1): Pt will consume current diet with minimal overt s/s of aspiration and supervision cues for use of compensatory strategies.   Skilled Therapeutic Interventions:  Skilled treatment session focused on cognition goals. SLP received pt in bed with eyes closed but moving around. Despite Max A verbal and tactile cues, pt would intermittently open eyes and turn head away from SLP with no active participation in any tasks presented by SLP. Question behavioral component as this pt is know to writer from acute care and evaluation on previous date. During treatment sessions provided on acute, pt with same behaviors of decreased participation/interest in therapy tasks with refusal of trials of thin liquids. However, pt with increased participation in evaluation when grandmother was present on previous date. Pt's prognosis is likely to be impacted by this fluctuation in participation when family is not present.   As a result of the above mentioned behaviors, formal cognitive evaluation and RMT assessment were not completed.      Pain Pain Assessment Pain Scale: Faces Faces Pain Scale: (pt will no attempt to  indicate)  Therapy/Group: Individual Therapy  Caleb Hubbard 01/17/2018, 10:22 AM

## 2018-01-17 NOTE — Progress Notes (Signed)
Physical Therapy Session Note  Patient Details  Name: Caleb Hubbard MRN: [id number] Date of Birth: 04-May-1980  Today's Date: 01/17/2018 PT Individual Time: 1545-1700 PT Individual Time Calculation (min): 75 min   Short Term Goals: Week 1:  PT Short Term Goal 1 (Week 1): Pt will initiate gait training PT Short Term Goal 2 (Week 1): Pt will initiate w/c mobility PT Short Term Goal 3 (Week 1): Pt will maintain static standing balance with min A  Skilled Therapeutic Interventions/Progress Updates:    Pt received supine in bed and agreeable to PT. Squat pivot transfer x4 throughout session with mod assist. Verbal cues for LE/UE placement and sequencing.  Monitored BP throughout session. 128/91 seated at beginning of session and 103/86 seated after gait training. Pt reported no symptoms of dizziness.   Performed hemi gait with R handrail in hallway x22f, max assist from SPT. Manual facilitation of L knee extension and to maintain upright posture due to pushing tendencies to the L. Max verbal cues for UE/LE placement and sequencing. Manual facilitation of L LE placement/advancement.    WC mobility using hemi method x50 ft with min assist for direction of WC. Verbal cues for proper technique. Switched pt to 16x18 WC for improved posture and pressure relief.   Standing weight shifts at R handrail in hallway, max assist from SPT. Pt able to initiate weight shifts with PT facilitation of L knee extension and lumbar extension.   Ended session with pt supine in bed, call bell within reach, and all needs met.  Therapy Documentation Precautions:  Precautions Precautions: Fall Precaution Comments: corretrack Restrictions Weight Bearing Restrictions: No Vital Signs: Therapy Vitals Temp: 98.1 F (36.7 C) Temp Source: Oral Pulse Rate: (!) 112 Resp: 16 BP: (!) 127/94 Patient Position (if appropriate): Lying Oxygen Therapy SpO2: 100 % O2 Device: Room Air Pain:  0/10   Therapy/Group:  Individual Therapy  AAmador Cunas12/08/2017, 5:17 PM

## 2018-01-17 NOTE — Progress Notes (Addendum)
Occupational Therapy Session Note  Patient Details  Name: Caleb Hubbard MRN: [id number] Date of Birth: 11-15-1980  Today's Date: 01/17/2018 OT Individual Time: 1300-1415 OT Individual Time Calculation (min): 75 min    Skilled Therapeutic Interventions/Progress Updates:    1;1.pt with no c/o pian and very flat affect requiring max encouragemetn throughout session to complete session to time. Pt completes stand pivot transfers with MOD A and VC for hand/foot placement on L side and manual A to set up L side EOB<>w/c, EOM and TTB. Pt dons shirt with MOD A overall for threading LLE, recalling hemi techniques and threading RUE (to keep IV from getting caught on shirt. Pt requires HOH A to apply deodorant at sink. Pt completes simulated shower transfer with MOD A and A to manage LLE over ledge with TTB. Educated pt on options for DME as mom has door on shower and may need to be removed  Depending on return in leg and safety. Pt verbalized understanding. Pt completes seated EOM sitting balance activity reaching anterior and laterally with RUE to obtain clothes pin and cross midline to place onto basketball hoop to facilitate deep proprioceptive input for fore/arm/shoulder. Pt completes scapular elevation/depression and protraction/retraction with MOD A for scapular mobility. Pt stands with MOD A for sit to stand and min A for standing balance once upright with VC for weight shifting onto LLE with L knee blocked while playing game of checkers. Exited session with pt seated in bed, call light in reach and exit alarm on.  Therapy Documentation Precautions:  Precautions Precautions: Fall Precaution Comments: corretrack Restrictions Weight Bearing Restrictions: No   Therapy/Group: Individual Therapy  Tonny Branch 01/17/2018, 2:19 PM

## 2018-01-18 LAB — URINALYSIS, ROUTINE W REFLEX MICROSCOPIC
Bilirubin Urine: NEGATIVE
Glucose, UA: NEGATIVE mg/dL
Hgb urine dipstick: NEGATIVE
Ketones, ur: NEGATIVE mg/dL
Leukocytes, UA: NEGATIVE
Nitrite: NEGATIVE
Protein, ur: NEGATIVE mg/dL
Specific Gravity, Urine: 1.017 (ref 1.005–1.030)
pH: 7 (ref 5.0–8.0)

## 2018-01-18 LAB — GLUCOSE, CAPILLARY
GLUCOSE-CAPILLARY: 123 mg/dL — AB (ref 70–99)
Glucose-Capillary: 102 mg/dL — ABNORMAL HIGH (ref 70–99)
Glucose-Capillary: 112 mg/dL — ABNORMAL HIGH (ref 70–99)
Glucose-Capillary: 93 mg/dL (ref 70–99)

## 2018-01-18 NOTE — Progress Notes (Signed)
[place] PHYSICAL MEDICINE & REHABILITATION PROGRESS NOTE   Subjective/Complaints:  Patient watching a movie.  He is interactive today.  ROS- no CP, SOB, N/V/D   Objective:   No results found. Recent Labs    01/15/18 1157 01/16/18 0507  WBC 10.5 13.3*  HGB 9.2* 8.4*  HCT 27.5* 27.1*  PLT 362 485*   Recent Labs    01/16/18 0507  NA 137  K 4.6  CL 96*  CO2 28  GLUCOSE 125*  BUN 13  CREATININE 1.13  CALCIUM 10.2    Intake/Output Summary (Last 24 hours) at 01/18/2018 1117 Last data filed at 01/18/2018 0900 Gross per 24 hour  Intake 440 ml  Output -  Net 440 ml     Physical Exam: Vital Signs Blood pressure 93/77, pulse (!) 114, temperature 98.6 F (37 C), resp. rate 14, height 5\' 11"  (1.803 m), weight 57.8 kg, SpO2 100 %.  General: No acute distress Mood and affect are appropriate Heart: Regular rate and rhythm no rubs murmurs or extra sounds Lungs: Clear to auscultation, breathing unlabored, no rales or wheezes Abdomen: Positive bowel sounds, soft nontender to palpation, nondistended Extremities: No clubbing, cyanosis, or edema Skin: No evidence of breakdown, no evidence of rash Neurologic: Cranial nerves II through XII intact, motor strength is 4/5 in right deltoid, bicep, tricep, grip, hip flexor, knee extensors, ankle dorsiflexor and plantar flexor, 0/5 on left except 3+ at trap  Sensory exam normal sensation to light touch and proprioception in bilateral upper and lower extremities Cerebellar exam normal finger to nose to finger as well as heel to shin in bilateral upper and lower extremities Musculoskeletal: Full range of motion in all 4 extremities. No joint swelling    Assessment/Plan: 1. 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  Physiatrist is providing close team supervision and 24  hour management of active medical problems listed below.  Physiatrist and rehab team continue to assess barriers to discharge/monitor patient progress toward functional and medical goals  Care Tool:  Bathing    Body parts bathed by patient: Left arm, Chest, Abdomen, Front perineal area, Right upper leg, Left upper leg, Face   Body parts bathed by helper: Buttocks, Right lower leg, Left lower leg, Right arm     Bathing assist Assist Level: Moderate Assistance - Patient 50 - 74%     Upper Body Dressing/Undressing Upper body dressing   What is the patient wearing?: Pull over shirt    Upper body assist Assist Level: Moderate Assistance - Patient 50 - 74%    Lower Body Dressing/Undressing Lower body dressing      What is the patient wearing?: Pants     Lower body assist Assist for lower body dressing: Moderate Assistance - Patient 50 - 74%     Toileting Toileting    Toileting assist Assist for toileting: Maximal Assistance - Patient 25 - 49%     Transfers Chair/bed transfer  Transfers assist     Chair/bed transfer assist level: Moderate Assistance - Patient 50 - 74%     Locomotion Ambulation   Ambulation assist   Ambulation activity did not occur: Safety/medical concerns  Assist level: Maximal Assistance - Patient 25 - 49% Assistive device: Other (comment)(hemi gait with R handrail) Max distance: 15   Walk 10 feet activity   Assist  Walk 10 feet activity did not occur: Safety/medical  concerns  Assist level: Maximal Assistance - Patient 25 - 49% Assistive device: Other (comment)(hemi gait with R handrail)   Walk 50 feet activity   Assist Walk 50 feet with 2 turns activity did not occur: Safety/medical concerns         Walk 150 feet activity   Assist Walk 150 feet activity did not occur: Safety/medical concerns         Walk 10 feet on uneven surface  activity   Assist Walk 10 feet on uneven surfaces activity did not occur:  Safety/medical concerns         Wheelchair     Assist Will patient use wheelchair at discharge?: (TBD) Type of Wheelchair: Manual Wheelchair activity did not occur: Safety/medical concerns  Wheelchair assist level: Minimal Assistance - Patient > 75% Max wheelchair distance: 50    Wheelchair 50 feet with 2 turns activity    Assist    Wheelchair 50 feet with 2 turns activity did not occur: Safety/medical concerns   Assist Level: Minimal Assistance - Patient > 75%   Wheelchair 150 feet activity     Assist Wheelchair 150 feet activity did not occur: Safety/medical concerns        Medical Problem List and Plan: 1. 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 DVT Prophylaxis/Anticoagulation: Pharmaceutical:Lovenox instead of heparin, to reduce needle sticks and increase efficacy in post stroke population3. Pain Management:tylenol prn 4. Mood:Denies anxiety or depression. Monitor for now. LCSW to follow for evaluation and support. 5. Neuropsych: This patientis not fullycapable of making decisions on hisown behalf. 6. Skin/Wound Care:routine pressure relief measures. 7. Fluids/Electrolytes/Nutrition:Monitor I/O. Check lytes in am. 8. QMV:HQIONGE BID. Continue metoprolol and catapres. Vitals:   01/18/18 0332 01/18/18 0938  BP: (!) 112/92 93/77  Pulse: (!) 105 (!) 114  Resp: 14   Temp: 98.6 F (37 C)   SpO2: 100%   Blood pressure control this morning, mild tachycardia on metoprolol 9. Pancytopenia:Has resolved post Granix--d/c neutropenic precautions.Leukocytosis may be related to granix will recheck in a.m. 10. Prediabetes: Hgb A1c- 6.4. Monitor BS ac/hs and use SSI for elevated BS 11. Testicular cancer:Dr. Magrinatrecommends no further chemo and monitoring AFP/HCG every 6 months X 2 years.  12. Hypertriglyceridemia:On  lipitor. 13. Depression: Was on Zyprexa for mood stabilization.  Now on trazodone  (also helping with excess drooling)   LOS: 3 days A FACE TO FACE EVALUATION WAS PERFORMED  [name] 01/18/2018, 11:17 AM

## 2018-01-19 ENCOUNTER — Inpatient Hospital Stay (HOSPITAL_COMMUNITY): Payer: Medicaid Other | Admitting: Occupational Therapy

## 2018-01-19 ENCOUNTER — Inpatient Hospital Stay (HOSPITAL_COMMUNITY): Payer: Medicaid Other

## 2018-01-19 ENCOUNTER — Encounter (HOSPITAL_COMMUNITY): Payer: Medicaid Other | Admitting: Speech Pathology

## 2018-01-19 ENCOUNTER — Inpatient Hospital Stay (HOSPITAL_COMMUNITY): Payer: Medicaid Other | Admitting: Physical Therapy

## 2018-01-19 DIAGNOSIS — F329 Major depressive disorder, single episode, unspecified: Secondary | ICD-10-CM

## 2018-01-19 LAB — BASIC METABOLIC PANEL
Anion gap: 11 (ref 5–15)
BUN: 20 mg/dL (ref 6–20)
CO2: 29 mmol/L (ref 22–32)
Calcium: 10 mg/dL (ref 8.9–10.3)
Chloride: 96 mmol/L — ABNORMAL LOW (ref 98–111)
Creatinine, Ser: 1.17 mg/dL (ref 0.61–1.24)
GFR calc Af Amer: >60 mL/min (ref >60)
GFR calc non Af Amer: >60 mL/min (ref >60)
GLUCOSE: 106 mg/dL — AB (ref 70–99)
Potassium: 4.7 mmol/L (ref 3.5–5.1)
Sodium: 136 mmol/L (ref 135–145)

## 2018-01-19 LAB — CBC
HCT: 27 % — ABNORMAL LOW (ref 39.0–52.0)
Hemoglobin: 8.5 g/dL — ABNORMAL LOW (ref 13.0–17.0)
MCH: 30.8 pg (ref 26.0–34.0)
MCHC: 31.5 g/dL (ref 30.0–36.0)
MCV: 97.8 fL (ref 80.0–100.0)
Platelets: 725 10*3/uL — ABNORMAL HIGH (ref 150–400)
RBC: 2.76 MIL/uL — ABNORMAL LOW (ref 4.22–5.81)
RDW: 15.9 % — ABNORMAL HIGH (ref 11.5–15.5)
WBC: 12.9 10*3/uL — ABNORMAL HIGH (ref 4.0–10.5)
nRBC: 0.3 % — ABNORMAL HIGH (ref 0.0–0.2)

## 2018-01-19 LAB — GLUCOSE, CAPILLARY
Glucose-Capillary: 98 mg/dL (ref 70–99)
Glucose-Capillary: 101 mg/dL — ABNORMAL HIGH (ref 70–99)
Glucose-Capillary: 89 mg/dL (ref 70–99)
Glucose-Capillary: 114 mg/dL — ABNORMAL HIGH (ref 70–99)

## 2018-01-19 LAB — URINE CULTURE: Culture: NO GROWTH

## 2018-01-19 MED ORDER — PANTOPRAZOLE SODIUM 40 MG PO TBEC
40.0000 mg | DELAYED_RELEASE_TABLET | Freq: Every day | ORAL | Status: DC
  Administered 2018-01-19 – 2018-02-09 (×22): 40 mg via ORAL
  Filled 2018-01-19 (×22): qty 1

## 2018-01-19 MED ORDER — TIZANIDINE HCL 2 MG PO TABS
2.0000 mg | ORAL_TABLET | Freq: Every day | ORAL | Status: DC
  Administered 2018-01-19 – 2018-01-20 (×2): 2 mg via ORAL
  Filled 2018-01-19 (×2): qty 1

## 2018-01-19 NOTE — Progress Notes (Signed)
Retta Diones, RN  Rehab Admission Coordinator  Physical Medicine and Rehabilitation  PMR Pre-admission  Signed  Date of Service:  01/15/2018 12:36 PM       Related encounter: ED to Hosp-Admission (Discharged) from 01/03/2018 in [facility]      Signed         Show:Clear all [x] Manual[x] Template[x] Copied  Added by: [x] Karl Bales Evalee Mutton, RN  [] Hover for details PMR Admission Coordinator Pre-Admission Assessment  Patient: Caleb Hubbard is an 37 y.o., male MRN: [id number] DOB: [birth year] Height: 5\' 11"  (180.3 cm) Weight: 61.1 kg                                                                                                                                                  Insurance Information Self pay - no insurance  Note:  Patient has medicaid for family planning waiver services only with coverage code [id number]  Medicaid Application Date:        Case Manager:   Disability Application Date:        Case Worker:    Emergency Publishing copy Information    Name Relation Home Work Mobile   [place], [place] Mother   [phone]   [facility] Relative [phone]     Rebekah [place] Significant other   [phone]     Current Medical History  Patient Admitting Diagnosis: 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 Estanislado Pandy.Follow-up MRI of brain showed numerous supra and infratentorial posterior circulation nonhemorrhagic infarcts with mild cytotoxic edema.Blood cultures x2 done due to leukocytosis and negative so far. Bilateral lower extreme Dopplers negative for DVT.TEE showed no cardiac source of emboli, no ASD or PFO,no wall abnormality and moderate LVH with EF of 65 to 70%..  Placed on neutropenic precautions due to drop in WBC to 0.5 and pancytopenic today.Therapy evaluations were completed.  Patient lethargic with difficulty following one-step commands, delayed processing andnoted to haveflaccid left upper extremity.  Dr. Leonie Man felt stroke embolic with and unclear source-that could be paradoxical emboli to continue aspirin and Brilinta given stent. CIR recommended due to functional deficits. Currently on a dysphagia 2, nectar thick liquids diet with SLP following.  Complete NIHSS TOTAL: 12  Past Medical History  Past Medical History:  Diagnosis Date  . Anxiety   . Cancer ([place])   . Headache   . Hypertension   . Testicular mass     Family History  family history includes High blood pressure in his father.  Prior Rehab/Hospitalizations: Recent orchiectomy with chemotherapy.  Has the patient had major surgery during 100 days prior to admission? Yes.  Lymph node dissection in 09/19.  Current Medications   Current Facility-Administered Medications:  .  [DISCONTINUED] acetaminophen (TYLENOL) tablet 650 mg, 650 mg, Oral, Q4H PRN **OR** acetaminophen (TYLENOL) solution 650 mg, 650 mg, Oral, Q4H PRN, 650 mg at 01/14/18 2116 **OR** acetaminophen (TYLENOL) suppository 650 mg, 650 mg, Rectal, Q4H PRN, Domenic Polite, MD .  amLODipine (NORVASC) tablet 10 mg, 10 mg, Oral, Daily, Domenic Polite, MD, 10 mg  at 01/15/18 1105 .  [DISCONTINUED] aspirin chewable tablet 81 mg, 81 mg, Oral, Daily **OR** aspirin chewable tablet 81 mg, 81 mg, Oral, Daily, Domenic Polite, MD, 81 mg at 01/15/18 1104 .  chlorhexidine (PERIDEX) 0.12 % solution 15 mL, 15 mL, Mouth Rinse, BID, Garvin Fila, MD, 15 mL at 01/14/18 2116 .  cloNIDine (CATAPRES) tablet 0.1 mg, 0.1 mg, Oral, Daily, Domenic Polite, MD, 0.1 mg at 01/15/18 1105 .  feeding supplement (ENSURE ENLIVE) (ENSURE ENLIVE) liquid 237 mL, 237 mL, Oral, TID BM, Domenic Polite, MD, 237 mL at 01/14/18 2116 .  heparin injection 5,000 Units, 5,000 Units, Subcutaneous, Q8H, Rosalin Hawking, MD, 5,000 Units at 01/14/18 2116 .  iohexol (OMNIPAQUE) 300 MG/ML solution 110 mL, 110 mL, Intravenous, Once PRN, Deveshwar, Sanjeev, MD .  MEDLINE mouth rinse, 15 mL, Mouth Rinse, q12n4p, Garvin Fila, MD, 15 mL at 01/12/18 1531 .  metoprolol tartrate (LOPRESSOR) tablet 25 mg, 25 mg, Oral, BID, Domenic Polite, MD, 25 mg at 01/15/18 1104 .  pantoprazole sodium (PROTONIX) 40 mg/20 mL oral suspension 40 mg, 40 mg, Oral, Q24H, Domenic Polite, MD, 40 mg at 01/14/18 1051 .  RESOURCE THICKENUP CLEAR, , Oral, PRN, Domenic Polite, MD .  ticagrelor [facility]) tablet 90 mg, 90 mg, Oral, BID, 90 mg at 01/15/18 1104 **OR** [DISCONTINUED] ticagrelor (BRILINTA) tablet 90 mg, 90 mg, Per Tube, BID, Deveshwar, Sanjeev, MD, 90 mg at 01/12/18 1007  Patients Current Diet:     Diet Order                  Diet - low sodium heart healthy         DIET DYS 2 Room service appropriate? Yes; Fluid consistency: Nectar Thick  Diet effective now               Precautions / Restrictions Precautions Precautions: Fall Precaution Comments: corretrack Restrictions Weight Bearing Restrictions: No   Has the patient had 2 or more falls or a fall with injury in the past year?No  Prior Activity Level Community (5-7x/wk): Went out 5 X a week, was driving.  Worked at a [employer] in  [employer] jobs, but not since Chemotherapy started.  Home Assistive Devices / Equipment Home Assistive Devices/Equipment: None Home Equipment: None  Prior Device Use: Indicate devices/aids used by the patient prior to current illness, exacerbation or injury? None  Prior Functional Level Prior Function Level of Independence: Independent Comments: driving, not working   Self Care: Did the patient need help bathing, dressing, using the toilet or eating?  Independent  Indoor Mobility: Did the patient need assistance with walking from room to room (with or without device)? Independent  Stairs: Did  the patient need assistance with internal or external stairs (with or without device)? Independent  Functional Cognition: Did the patient need help planning regular tasks such as shopping or remembering to take medications? Independent  Current Functional Level Cognition  Arousal/Alertness: Awake/alert Overall Cognitive Status: Impaired/Different from baseline Difficult to assess due to: (pt often with soft spoken/mumbled speech) Current Attention Level: Selective Orientation Level: Oriented to person, Oriented to place, Oriented to situation Following Commands: Follows one step commands consistently Safety/Judgement: Decreased awareness of safety, Decreased awareness of deficits General Comments: pt overall following 1 step commands consistently this session, continues to require cue and increased time for processing Attention: Selective Selective Attention: Impaired Selective Attention Impairment: Verbal basic, Functional basic Memory: Impaired Memory Impairment: Retrieval deficit, Decreased recall of new information, Decreased short term memory Decreased Short Term Memory: Verbal basic, Functional basic Awareness: Impaired Awareness Impairment: Emergent impairment Problem Solving: Impaired Problem Solving Impairment: Verbal complex, Functional complex Safety/Judgment:  Impaired    Extremity Assessment (includes Sensation/Coordination)  Upper Extremity Assessment: LUE deficits/detail RUE Deficits / Details: WFL  LUE Deficits / Details: developing flexor synergy; beginning to perform synergy pattern voluntarily; and able to move minimally out of synergy pattern; Brunstrom stage II/ III arm; III hand - not using functionally LUE Sensation: WNL LUE Coordination: decreased fine motor, decreased gross motor  Lower Extremity Assessment: Defer to PT evaluation LLE Deficits / Details: flaccid, no initiation of movement, no withdrawl to pain however gave thumbs up with R accurately when asked if he was being touched    ADLs  Overall ADL's : Needs assistance/impaired Eating/Feeding: Minimal assistance, Sitting Eating/Feeding Details (indicate cue type and reason): dysphagia 2; per GF report pt should use spoon? utilized spoon initially however pt frustrated with this method, provided supervision while pt took sips of juice (modified consistency), though requires cues for small sips and to pause to swallow Grooming: Moderate assistance Grooming Details (indicate cue type and reason): difficulty controlling secretions in sitting Upper Body Bathing: Total assistance, Sitting Lower Body Bathing: Total assistance, +2 for physical assistance, Bed level Upper Body Dressing : Maximal assistance Upper Body Dressing Details (indicate cue type and reason): hand over hand to initiate donning gown over hemi arm Lower Body Dressing: Total assistance, +2 for physical assistance, Bed level Toilet Transfer Details (indicate cue type and reason): deferred due to safety Toileting- Clothing Manipulation and Hygiene: Total assistance, Bed level, +2 for physical assistance Functional mobility during ADLs: Maximal assistance, +2 for physical assistance General ADL Comments: use of Stedy for transfer OOB to chair during this session; pt with improvements in mobility, requires  encouragement intermittently during session to participate    Mobility  Overal bed mobility: Needs Assistance Bed Mobility: Supine to Sit Rolling: Min assist Sidelying to sit: Mod assist Supine to sit: Min assist, +2 for physical assistance Sit to supine: Mod assist, +2 for physical assistance, HOB elevated General bed mobility comments: Pt was able to sit up in bed with no assistance with flat HOB.  Pt required VC and min A to move B LE towards EOB.  Patient initiated movement with  R LE but required increased assistance to move L LE.    Transfers  Overall transfer level: Needs assistance Equipment used: Ambulation equipment used Transfer via Lift Equipment: Stedy Transfers: Sit to/from [employer] to Stand: Min assist, Mod assist, +2 physical assistance Stand pivot transfers: Mod assist, +2 physical assistance General transfer comment: Pt required mod A +2 and VC to scoot to EOB.  Patient  was able to hold front stedy bar with R hand.  Pt was unable to grip stedy bar with L hand and required support from therapist.  Pt completed 3 sit to stands to stedy requiring mod A+2 for first attempt and min A +2 for last 2 attempts.  Pt was able to pull into standing with R hand.      Ambulation / Gait / Stairs / Wheelchair Mobility  Ambulation/Gait Ambulation/Gait assistance: Total assist Gait Distance (Feet): 1 Feet Gait Pattern/deviations: Step-to pattern    Posture / Balance Dynamic Sitting Balance Sitting balance - Comments: occasional minA for sitting balance, overall requires use of single UE support; attempted dynamic reaching activity however pt initiating return to supine with attempts (reports due to fatigue)  Balance Overall balance assessment: Needs assistance Sitting-balance support: Feet supported, Single extremity supported Sitting balance-Leahy Scale: Poor Sitting balance - Comments: occasional minA for sitting balance, overall requires use of single UE support; attempted  dynamic reaching activity however pt initiating return to supine with attempts (reports due to fatigue)  Postural control: Posterior lean Standing balance support: Bilateral upper extremity supported, Single extremity supported Standing balance-Leahy Scale: Poor Standing balance comment: reliant on UE support/ external assist     Special needs/care consideration BiPAP/CPAP No CPM No Continuous Drip IV No Dialysis No         Life Vest No Oxygen No Special Bed No Trach Size No Wound Vac (area) No    Skin No                          Bowel mgmt: Last BM 01/13/18 Bladder mgmt: External urinary catheter in place Diabetic mgmt No    Previous Home Environment Living Arrangements: Alone  Lives With: Family Available Help at Discharge: Family Type of Home: House Home Layout: Two level, Able to live on main level with bedroom/bathroom Home Access: Stairs to enter Entrance Stairs-Rails: None Entrance Stairs-Number of Steps: 3 Bathroom Shower/Tub: Multimedia programmer: Standard Home Care Services: No Additional Comments: lives with significant other, but home setup is for his parents house in [place] as his mother would like to take him home. (information provided from mother )  Discharge Living Setting Plans for Discharge Living Setting: House, Lives with (comment)(Plans to go home with parents.) Type of Home at Discharge: House Discharge Home Layout: Two level, Able to live on main level with bedroom/bathroom Alternate Level Stairs-Number of Steps: Flight Discharge Home Access: Stairs to enter Technical brewer of Steps: 4 steps Discharge Bathroom Shower/Tub: Walk-in shower, Door Discharge Bathroom Toilet: Standard Discharge Bathroom Accessibility: Yes How Accessible: Accessible via walker Does the patient have any problems obtaining your medications?: No  Social/Family/Support Systems Patient Roles: Other (Comment)(Mom and Dad in [place], has a  girlfriend locally.) Contact Information: Psychologist, educational - mother Anticipated Caregiver: mom and dad Anticipated Caregiver's Contact Information: Myrtie Soman - mom - [phone] Ability/Limitations of Caregiver: Girlfriend with medical issures.  Mom and Dad can assist, dad works. Caregiver Availability: 24/7 Discharge Plan Discussed with Primary Caregiver: Yes Is Caregiver In Agreement with Plan?: Yes Does Caregiver/Family have Issues with Lodging/Transportation while Pt is in Rehab?: No  Goals/Additional Needs Patient/Family Goal for Rehab: PT/OT/SLP supervision to min assist goals Expected length of stay: 14-20 days Cultural Considerations: None Dietary Needs: Dys 2, nectar thick liquids Equipment Needs: TBD Pt/Family Agrees to Admission and willing to participate: Yes Program Orientation Provided & Reviewed with Pt/Caregiver Including Roles  & Responsibilities: Yes  Decrease burden of Care through IP rehab admission: N/A  Possible need for SNF placement upon discharge: Not planned  Patient Condition: This patient's medical and functional status has changed since the consult dated: 01/07/18 in which the Rehabilitation Physician determined and documented that the patient's condition is appropriate for intensive rehabilitative care in an inpatient rehabilitation facility. See "History of Present Illness" (above) for medical update. Functional changes are:  Currently requiring min to mod for transfers and mod to max assist for ADLs. Patient's medical and functional status update has been discussed with the Rehabilitation physician and patient remains appropriate for inpatient rehabilitation. Will admit to inpatient rehab today.  Preadmission Screen Completed By:  Retta Diones, 01/15/2018 2:08 PM ______________________________________________________________________   Discussed status with Dr. Letta Pate on 01/15/18 at 1408 and received telephone approval for admission today.  Admission  Coordinator:  Retta Diones, time 1408/Date 01/15/18           Cosigned by: [name], MD at 01/15/2018 2:14 PM  Revision History

## 2018-01-19 NOTE — Consult Note (Signed)
Neuropsychological Consultation   Patient:   Caleb Hubbard   DOB:   1980/07/25  MR Number:  [id number]  Location:  [facility] [id number] [place] [address] Dept: [place]: [phone]           Date of Service:   01/19/2018  Start Time:   8 AM End Time:   9 AM  Provider/Observer:  Ilean Skill, Psy.D.       Clinical Neuropsychologist       Billing Code/Service: [phone] 4 Units  Chief Complaint:    Caleb Hubbard is a 37 year old male with history of HTN, situational depression, testicular cancer with current treatment including chemo starting on 12/29/2017.  Patient admitted on 01/03/2018 with confusion, dystonic posturing and concerns of seizure activity.  USD positive for THC.  MRI showed numerous supra and infratentorial tentorial posterior circulation nonhemorrhagic infarcts with mild cytotoxic edema.  Stoke was felt to be embolic due to unclear service possible underlying basilar stenosis versus hypertension versus smoking/THC use.  Patient has been very depressed with ongoing vision and motor deficits.    Reason for Service:  The patient was referred for neuropsychological consultation due to coping and adjustment issues.  Below is the HPI for the current admission.    Caleb Hubbard is a27 year old male with history of HTN, situational depression, testicular cancer S/B radical orchiectomy, recent retroperitoneal lymph node dissection with initiation of chemo 12/29/2017;who was admitted on 11/23 with confusion, dystonic posturing and concerns of seizure activity. UDS positive for THC. CT head done showing patchy hypodensity in bilateral cerebellum and occipital lobe suggestive of acute infarcts versus PR ES. 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 He underwent cerebral angiogram and endovascular revascularization of  occluded basilar artery followed by rescue stent of severe focal stenosis in mid basilar artery by Dr. Terrilee Files. Follow-up MRI of brain showed numerous supra and infratentorial tentorial posterior circulation nonhemorrhagic infarcts with mild cytotoxic edema. Blood cultures x2 done due to leukocytosis and were negative. BLE Dopplers negative for DVT. TEE showed no cardiac source of emboli, no ASD or PFO and moderate LVH with EF of 65 to 70%.  She developed pancytopenia with drop in white count to 0.5 and was treated with Granix daily with improvement in Caleb Hubbard. Blood pressures have been labile and medications titrated for better control. Heme-onc did not feel that hypercoagulable state was responsible for stroke.Dr. Leonie Man felt that stroke embolic due to unclear service possible underlying basilar stenosis versus hypertension versus smoking/THC use.He is to continue on low-dose ASA as well as Brilinta twice daily due to stent. MBS done for swallow eval and patient started on dysphagia to nectar's.He has had issues with tachycardia and CTA chest negative for PE.Dr. Jana Hakim felt that patient's cancer prognosis was good and does not recommend proceeding with further chemotherapy. Patient with resultant left hemiplegia, dysphagia, aphasia and cognitive deficitsaffecting overall functional status. CIR was recommended for further therapy  Current Status:  The patient describes ongoing depression and coping issues.  He worries that his THC use may have caused the stroke, although there are many factors that likely played a role.  He reprots that he fears that affects of his stroke will have major deleterious impact on his life.  Patient motivated for therapies but anxiety, fear and depression are issues to monitor for possible negative impact on therapies.    Behavioral Observation: Caleb Hubbard  presents as a 37 y.o.-year-old Right African American Male who appeared his stated age. his dress was  Appropriate and he was Well Groomed and his manners were Appropriate to the situation.  his participation was indicative of Appropriate, Inattentive and Redirectable behaviors.  There were any physical disabilities noted.  he displayed an appropriate level of cooperation and motivation.     Interactions:    Active Appropriate, Inattentive and Redirectable  Attention:   abnormal and attention span appeared shorter than expected for age  Memory:   within normal limits; recent and remote memory intact  Visuo-spatial:  abnormal  Visual deficits reported.  Speech (Volume):  low  Speech:   slurred  Thought Process:  Coherent and Tangential  Though Content:  WNL; not suicidal and not homicidal  Orientation:   person, place, time/date and situation  Judgment:   Fair  Planning:   Poor  Affect:    Anxious, Depressed and Lethargic  Mood:    Dysphoric  Insight:   Fair  Intelligence:   normal  Substance Use:  There is a documented history of marijuana abuse confirmed by the patient.  The patient denies any alcohol use.  Medical History:   Past Medical History:  Diagnosis Date  . Anxiety   . Cancer ([place])   . Headache   . Hypertension   . Testicular mass             Psychiatric History:  Patient has past history of anxiety but also reports symptoms of depression currently and in past.    Family Med/Psych History:  Family History  Problem Relation Age of Onset  . High blood pressure Father     Risk of Suicide/Violence: low Patient denies SI or HI.  Impression/DX:  Caleb Hubbard is a 37 year old male with history of HTN, situational depression, testicular cancer with current treatment including chemo starting on 12/29/2017.  Patient admitted on 01/03/2018 with confusion, dystonic posturing and concerns of seizure activity.  USD positive for THC.  MRI showed numerous supra and infratentorial tentorial posterior circulation nonhemorrhagic infarcts with mild cytotoxic edema.  Stoke  was felt to be embolic due to unclear service possible underlying basilar stenosis versus hypertension versus smoking/THC use.  Patient has been very depressed with ongoing vision and motor deficits.   The patient describes ongoing depression and coping issues.  He worries that his THC use may have caused the stroke, although there are many factors that likely played a role.  He reprots that he fears that affects of his stroke will have major deleterious impact on his life.  Patient motivated for therapies but anxiety, fear and depression are issues to monitor for possible negative impact on therapies.  Disposition/Plan:  Will need to see patient again later this week due to concerns about anxiety/depression.    Diagnosis:    Basilar artery embolism - Plan: Ambulatory referral to Physical Medicine Rehab       Reactive depression  Electronically Signed   _______________________ Ilean Skill, Psy.D.

## 2018-01-19 NOTE — Progress Notes (Addendum)
Modified Barium Swallow Progress Note  Patient Details  Name: Caleb Hubbard MRN: [id number] Date of Birth: Aug 31, 1980  Today's Date: 01/19/2018  Modified Barium Swallow completed.  Full report located under Chart Review in the Imaging Section.  Brief recommendations include the following:  Clinical Impression  Pt presents with mild oropharyngeal dysphagia. Oral phase is c/b decreased bolus cohesion and mildly increased mastication of peaches as well as chewing the barium tablet. Pharyngeal phase is c/b sensory impairment that results in consistently delayed swallow initiation to pyriform sinuses with intermittent penetration during swallow when consuming thin liquids. No aspiration was observed. Recommend continuing dysphagia 2 diet with thin liquids (via cup or straw), medicine crushed with intermittent suprvision.   Of note, pt is known to this Probation officer. Pt continues to present with fluctuating verbal communication and overall mentation. During this study pt was initially nonverbal and used gestures. Support provided that pt would benefit from being the person he was "when his grandmother was present on weekend." Pt immediately began verbalizing and interacting more appropriately. However pt continues to use immature language and intonation. he described the barium as "bad juice" etc.    Swallow Evaluation Recommendations       SLP Diet Recommendations: Dysphagia 2 (Fine chop) solids;Thin liquid   Liquid Administration via: Cup;Straw   Medication Administration: Crushed with puree   Supervision: Patient able to self feed;Intermittent supervision to cue for compensatory strategies   Compensations: Minimize environmental distractions;Slow rate;Small sips/bites   Postural Changes: Seated upright at 90 degrees   Oral Care Recommendations: Oral care BID        Jacquelina Hewins 01/19/2018,10:19 AM

## 2018-01-19 NOTE — Progress Notes (Signed)
Speech Language Pathology Daily Session Note  Patient Details  Name: Clenton Esper MRN: [id number] Date of Birth: 06-16-1980  Today's Date: 01/19/2018 SLP Individual Time: 1022-1030 SLP Individual Time Calculation (min): 8 min  Short Term Goals: Week 1: SLP Short Term Goal 1 (Week 1): Pt will utilize external memory aids to recall daily information with supervision cues.  SLP Short Term Goal 2 (Week 1): Pt will complex semi-complex problem solving tasks with Min A cues.  SLP Short Term Goal 3 (Week 1): Pt will demonstrate selective attention in mildly distracting environment for ~ 30 minutes with Min A cues.  SLP Short Term Goal 4 (Week 1): Pt will utilize speech intelligibility strategies to achieve ~ 90% speech intelligibility at the sentence level.  SLP Short Term Goal 5 (Week 1): Pt will consume current diet with minimal overt s/s of aspiration and supervision cues for use of compensatory strategies.   Skilled Therapeutic Interventions:  Skilled treatment session focused on education with pt and girlfriend on diet upgrade. Signage updated in room and order requested. All questions answered to pt and girlfriend's satisfaction.      Pain Pain Assessment Pain Scale: 0-10 Pain Score: 0-No pain Pain Type: Chronic pain Pain Location: Foot Pain Orientation: Left Pain Descriptors / Indicators: Aching Pain Frequency: Intermittent Pain Onset: On-going Pain Intervention(s): Medication (See eMAR)  Therapy/Group: Individual Therapy  Taneisha Fuson 01/19/2018, 10:23 AM

## 2018-01-19 NOTE — Progress Notes (Signed)
Occupational Therapy Session Note  Patient Details  Name: Caleb Hubbard MRN: [id number] Date of Birth: Oct 05, 1980  Today's Date: 01/19/2018 OT Individual Time: 1305-1400 OT Individual Time Calculation (min): 55 min  and Today's Date: 01/19/2018 OT Missed Time: 20 Minutes Missed Time Reason: Patient unwilling/refused to participate without medical reason;Patient fatigue   Short Term Goals: Week 1:  OT Short Term Goal 1 (Week 1): Pt will transfer to BSC/toilet consistantly iwht Min A and LRAD OT Short Term Goal 2 (Week 1): Pt will don shirt wiht min A OT Short Term Goal 3 (Week 1): Pt will manage LUE prior to transfers wiht min VC OT Short Term Goal 4 (Week 1): pt will recall hemi dressing techniques with min VC OT Short Term Goal 5 (Week 1): Pt will thread BLE into pants with min A  Skilled Therapeutic Interventions/Progress Updates:    Treatment session with focus on activity tolerance and continued visual assessment.  Pt received supine in bed with covers pulled over head.  With increased time, pt agreeable to focusing on vision.  Completed bed mobility with mod assist and mod cues for sequencing of rolling, squat pivot transfers mod assist throughout session.  Engaged in visual activities in min-moderately distracting environment with focus on visual scanning, attend to Lt visual field, and use of head turns to compensate for decreased vision.  Pt able to engage in line bisection, clock drawing, and cancellation activities.  Pt demonstrating Lt field deficit during clock drawing, but able to demonstrate increased awareness with ability to fill in remainder of numbers on Lt with increased time.  Pt completed line bisection with mild head turn to compensate and noted omission of items with cancellation activity.  Pt reports eye fatigue and requesting to return to bed.  Pt left in sidelying with all needs in reach.  Therapy Documentation Precautions:  Precautions Precautions: Fall Precaution  Comments: corretrack Restrictions Weight Bearing Restrictions: No General: General OT Amount of Missed Time: 20 Minutes PT Missed Treatment Reason: Patient unwilling to participate Vital Signs: Therapy Vitals Temp: 98.6 F (37 C) Pulse Rate: 96 Resp: 14 BP: 122/90 Patient Position (if appropriate): Lying Oxygen Therapy SpO2: 100 % O2 Device: Room Air Pain:  Pt with no c/o pain   Therapy/Group: Individual Therapy  Simonne Come 01/19/2018, 3:37 PM

## 2018-01-19 NOTE — Progress Notes (Signed)
Occupational Therapy Note  Patient Details  Name: Caleb Hubbard MRN: [id number] Date of Birth: 30-Apr-1980  Today's Date: 01/19/2018 OT Missed Time: 48 Minutes Missed Time Reason: Patient unwilling/refused to participate without medical reason;Patient fatigue  Upon entering the room, pt supine in bed with covers pulled over head. Pt not speaking to therapist this session. Pt shaking head "no" when asked to participate. Pt shook head "yes" when asked if in pain and pointed to L side. OT notified RN. Pt refusing OT intervention and 30 minutes missed of skilled intervention.    Darleen Crocker P 01/19/2018, 2:35 PM

## 2018-01-19 NOTE — Progress Notes (Addendum)
Physical Therapy Session Note  Patient Details  Name: Caleb Hubbard MRN: [id number] Date of Birth: Dec 22, 1980  Today's Date: 01/19/2018 PT Individual Time: 1101-1131 PT Individual Time Calculation (min): 30 min   Short Term Goals: Week 1:  PT Short Term Goal 1 (Week 1): Pt will initiate gait training PT Short Term Goal 2 (Week 1): Pt will initiate w/c mobility PT Short Term Goal 3 (Week 1): Pt will maintain static standing balance with min A  Skilled Therapeutic Interventions/Progress Updates:  Pt received in bed and reluctantly agreeable to tx. No c/o pain reported, but pt stating "I feel like crap" but not elaborating during session. Therapist dons non-skid socks total assist for time management. Pt requires max encouragement and multimodal cuing to initiate transferring to EOB with cuing to utilize bed rails to pull to sitting with pt able to transfer to EOB with min assist. Pt transfers bed>w/c with mod assist with therapist blocking LLE and providing max cuing for hand placement and sequencing. Transported pt to rail by gym and pt transfers sit<>stand with mod assist to attempt gait at the rail. Therapist provides encouragement, assistance with weight shifting L for increased LLE weight bearing, blocking A/P L knee to prevent buckling and hyperextension with encouragement for pt to advance RLE with pt demonstrating decreased step length RLE. Pt also with LLE flaccid with no active movement noted and pt unwilling to attempt to advance extremity. Pt very disengaged throughout session, not making eye contact and minimally verbally communicating with therapist. Pt reports "it won't get better" and therapist educated him on stroke recovery. Pt ultimately returned to room and left in w/c with chair alarm donned & call bell in reach, chaplain entering room as pt unwilling to actively participate in session.   Therapy Documentation Precautions:  Precautions Precautions: Fall Precaution Comments:  corretrack Restrictions Weight Bearing Restrictions: No General: PT Amount of Missed Time (min): 30 Minutes PT Missed Treatment Reason: Patient unwilling to participate   Therapy/Group: Individual Therapy  [name] 01/19/2018, 11:55 AM

## 2018-01-19 NOTE — Progress Notes (Signed)
[name], MD  Physician  Physical Medicine and Rehabilitation  Consult Note  Signed  Date of Service:  01/07/2018 9:20 AM       Related encounter: ED to Hosp-Admission (Discharged) from 01/03/2018 in [facility]      Signed      Expand All Collapse All    Show:Clear all [x] Manual[x] Template[] Copied  Added by: [x] Kirsteins, Luanna Salk, MD[x] Love, Ivan Anchors, PA-C  [] Hover for details      Physical Medicine and Rehabilitation Consult   Reason for Consult:  Functional deficits due to stroke.  Referring Physician: Dr. Leonie Man   HPI: Caleb Hubbard is a 37 y.o. male with history of HTN, situational depression, testicular cancer status post radical orchiectomy, recent retroperitoneal lymph node dissection with initiation of chemo11/18; who was admitted on 01/03/2018 with confusion, dystonic posturing and concerns of seizure seizure activity.  UDS positive for THC. CT head done showing patchy low density in bilateral cerebellum and occipital lobes suggestive of acute infarcts versus PRES. 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  He underwent cerebral angiogram with endovascular revascularization of occluded basilar artery followed by rescue stent of severe focal stenosis in mid basilar artery by Dr. Estanislado Pandy.  Follow-up MRI of brain showed numerous supra and infratentorial posterior circulation nonhemorrhagic infarcts with mild cytotoxic edema.  Blood cultures x2 done due to leukocytosis and negative so far.  Bilateral lower extreme Dopplers negative for DVT.  TEE showed no cardiac source of emboli, no ASD or PFO, no wall abnormality and moderate LVH with EF of 65 to 70%..  Placed on neutropenic precautions due to drop in WBC to 0.5 and pancytopenic today. Therapy evaluation done while intubated and limited. Patient lethargic with difficulty following one-step commands, delayed processing and  noted to have flaccid left upper extremity.  He tolerated extubation yesterday and failed bedside swallow evaluation today. Tube feeds recommended for nutritional support. Dr. Leonie Man felt stroke embolic with and unclear source-that could be paradoxical emboli to continue aspirin and Brilinta given stent. CIR recommended due  to functional deficits.   opens eyes to command  Review of Systems  Unable to perform ROS: Patient nonverbal  Eyes: Positive for blurred vision.  Respiratory: Negative for cough and hemoptysis.   Cardiovascular: Negative for chest pain and palpitations.  Neurological: Positive for sensory change (pointing to left side as source of pain), speech change and focal weakness.          Past Medical History:  Diagnosis Date  . Anxiety   . Cancer ([place])   . Headache   . Hypertension   . Testicular mass          Past Surgical History:  Procedure Laterality Date  . IR ANGIO INTRA EXTRACRAN SEL COM CAROTID INNOMINATE BILAT MOD SED  01/03/2018  . IR ANGIO VERTEBRAL SEL VERTEBRAL UNI L MOD SED  01/03/2018  . IR CT HEAD LTD  01/03/2018  . IR INTRA CRAN STENT  01/03/2018  . IR PERCUTANEOUS ART THROMBECTOMY/INFUSION INTRACRANIAL INC DIAG ANGIO  01/03/2018  . NO PAST SURGERIES    . ORCHIECTOMY Right 06/17/2017   Procedure: RIGHT RADICAL ORCHIECTOMY;  Surgeon: Ceasar Mons, MD;  Location: [facility];  Service: Urology;  Laterality: Right;  . RADIOLOGY WITH ANESTHESIA N/A 01/03/2018   Procedure: CODE STROKE;  Surgeon: Luanne Bras, MD;  Location: [facility];  Service: Radiology;  Laterality: N/A;         Family History  Problem Relation Age of Onset  . High blood pressure Father      Social History:   Single. Has been unemployed since diagnosis of cancer? Lives with girlfriend (disabled due to psychological issues?) and her 75 year old child. Parents live in [place] and supportive.  Per reports that he has been smoking cigarettes and  cigars. He has never used smokeless tobacco. Per  Reports he has current or past drug history. Drug: Marijuana. Per reports  he does not drink alcohol.    Allergies: No Known Allergies          Medications Prior to Admission  Medication Sig Dispense Refill  . amLODipine (NORVASC) 10 MG tablet Take 10 mg by mouth daily.    . chlorthalidone (HYGROTON) 25 MG tablet Take 25 mg by mouth daily.  2  . cloNIDine (CATAPRES) 0.2 MG tablet Take 0.2 mg by mouth 2 (two) times daily.    Marland Kitchen dexamethasone (DECADRON) 4 MG tablet Take 4 mg by mouth See admin instructions. Take 2 tablets by mouth once daily for 3 days start the day after chemotherapy and continue for 3 days  0  . OLANZapine (ZYPREXA) 10 MG tablet Take 10 mg by mouth See admin instructions. Take 1 tablet by mouth nightly for 3 doses start the day after chemotherapy and continue for 3 days  2  . ondansetron (ZOFRAN) 8 MG tablet Take 8 mg by mouth every 8 (eight) hours as needed for nausea/vomiting.  2  . prochlorperazine (COMPAZINE) 10 MG tablet Take 10 mg by mouth every 6 (six) hours as needed for nausea/vomiting.  2    Home: Home Living Family/patient expects to be discharged to:: Private residence Living Arrangements: Parent Available Help at Discharge: Family Type of Home: House Home Access: Stairs to enter Technical brewer of Steps: 3 Entrance Stairs-Rails: None Home Layout: Two level, Able to live on main level with bedroom/bathroom Bathroom Shower/Tub: Multimedia programmer: [place]: None Additional Comments: lives with significant other, but home setup is for his parents house in [place] as his mother would like to take him home. (information provided from mother )  Functional History: Prior Function Level of Independence: Independent Comments: driving, not working  Functional Status:  Mobility: Bed Mobility Overal bed mobility: Needs Assistance Bed Mobility: Supine to Sit,  Sit to Supine Supine to sit: +2 for physical assistance, HOB elevated, Max assist Sit to supine: +2 for physical assistance, HOB elevated, Max assist General bed mobility comments: pt pulled up with with R UE on PT however due to vent required maxAx2 Transfers General transfer comment: deferred due to vent and lethargy  ADL: ADL Overall ADL's : Needs assistance/impaired Eating/Feeding: NPO Grooming: Total assistance, Bed level Upper Body Bathing: Total assistance, Sitting Lower Body Bathing: Total assistance, +2 for physical assistance, Bed level Upper Body Dressing : Total assistance, Sitting Lower Body Dressing: Total assistance, +2 for physical assistance, Bed level Toilet Transfer Details (indicate cue type and reason): deferred due to safety Toileting- Clothing Manipulation and Hygiene: Total assistance, Bed level, +2 for physical assistance Functional mobility during ADLs: Total assistance, +2 for physical assistance General ADL Comments: pt requires total assist +2 for bed mobility and sitting EOB, patient able to assist with use of R UE at times but fatigues easily   Cognition: Cognition Overall Cognitive Status: Impaired/Different from baseline Orientation Level: (P) Oriented X4(with choices, nods appropriately) Cognition Arousal/Alertness: Lethargic Behavior During Therapy: Flat affect Overall Cognitive Status: Impaired/Different from baseline Area of Impairment:  Following commands, Safety/judgement, Awareness, Problem solving Following Commands: Follows one step commands with increased time, Follows one step commands inconsistently Safety/Judgement: Decreased awareness of deficits(R gaze preference) Problem Solving: Difficulty sequencing General Comments: pt able to follow 1 step commands but unable to initiate L UE/LE Difficult to assess due to: Intubated, Level of arousal  Blood pressure 131/77, pulse 87, temperature 99.3 F (37.4 C), temperature source Oral,  resp. rate (!) 30, height 5\' 11"  (1.803 m), weight 67 kg, SpO2 98 %. Physical Exam  Nursing note and vitals reviewed. Constitutional: He appears well-developed and well-nourished.  Lying in bed with eyes closed. Some lability noted.   Cardiovascular: Tachycardia present.  Respiratory: No stridor.  Neurological: He is alert.  Kept eyes closed but interacted and was able to follow basic motor commands without difficulty. Non-verbal--opened mouth minimally. Tongue deviation to the left.  Question dysconjugate gaze on the right with right inattention. Left sided weakness with sensory deficits (indicating pain along left hemibody). Left sided weakness with flexor tone LUE.   Patient does not cooperate with formal manual muscle testing. He withdraws to pinch in bilateral lower limbs and right upper limb, could not test left upper limb due to patient's bed position          Assessment/Plan: Diagnosis: Basilar artery thrombosis with weakness, impaired cognition, dysphagia 1. Does the need for close, 24 hr/day medical supervision in concert with the patient's rehab needs make it unreasonable for this patient to be served in a less intensive setting? Yes 2. Co-Morbidities requiring supervision/potential complications: Testicular cot carcinoma status post orchiectomy, post chemo neutropenia 3. Due to bladder management, bowel management, safety, skin/wound care, disease management, medication administration, pain management and patient education, does the patient require 24 hr/day rehab nursing? Yes 4. Does the patient require coordinated care of a physician, rehab nurse, PT (1-2 hrs/day, 5 days/week), OT (1-2 hrs/day, 5 days/week) and SLP (.5-1 hrs/day, 5 days/week) to address physical and functional deficits in the context of the above medical diagnosis(es)? Yes Addressing deficits in the following areas: balance, endurance, locomotion, strength, transferring, bowel/bladder control, bathing, dressing,  feeding, grooming, toileting, cognition, speech, language, swallowing and psychosocial support 5. Can the patient actively participate in an intensive therapy program of at least 3 hrs of therapy per day at least 5 days per week? No 6. The potential for patient to make measurable gains while on inpatient rehab is Currently poor 7. Anticipated functional outcomes upon discharge from inpatient rehab are n/a  with PT, n/a with OT, n/a with SLP. 8. Estimated rehab length of stay to reach the above functional goals is: Once appropriate, will need approximately 4 weeks 9. Anticipated D/C setting: Home versus SNF 10. Anticipated post D/C treatments: [facility] therapy 11. Overall Rehab/Functional Prognosis: fair  RECOMMENDATIONS: This patient's condition is appropriate for continued rehabilitative care in the following setting: Not ready for CIR at the current time.  Would anticipate further improvement and increasing therapy participation Patient has agreed to participate in recommended program. N/A Note that insurance prior authorization may be required for reimbursement for recommended care.  Comment: Will monitor progress while in acute care setting  "I have personally performed a face to face diagnostic evaluation of this patient.  Additionally, I have reviewed and concur with the physician assistant's documentation above." [name] M.D. [facility] FAAPM&R (Sports Med, Neuromuscular Med) Diplomate Am Board of [place], PA-C 01/07/2018        Revision History  Routing History

## 2018-01-19 NOTE — Progress Notes (Signed)
[facility] PHYSICAL MEDICINE & REHABILITATION PROGRESS NOTE   Subjective/Complaints:  Sleeping with eyeglasses on, no c/os , awakens to voice   ROS- no CP, SOB, N/V/D   Objective:   No results found. No results for input(s): WBC, HGB, HCT, PLT in the last 72 hours. No results for input(s): NA, K, CL, CO2, GLUCOSE, BUN, CREATININE, CALCIUM in the last 72 hours.  Intake/Output Summary (Last 24 hours) at 01/19/2018 0722 Last data filed at 01/19/2018 0703 Gross per 24 hour  Intake 540 ml  Output 650 ml  Net -110 ml     Physical Exam: Vital Signs Blood pressure (!) 121/98, pulse 60, temperature 98 F (36.7 C), temperature source Oral, resp. rate 19, height 5\' 11"  (1.803 m), weight 58 kg, SpO2 100 %.  General: No acute distress Mood and affect are appropriate Heart: Regular rate and rhythm no rubs murmurs or extra sounds Lungs: Clear to auscultation, breathing unlabored, no rales or wheezes Abdomen: Positive bowel sounds, soft nontender to palpation, nondistended Extremities: No clubbing, cyanosis, or edema Skin: No evidence of breakdown, no evidence of rash Neurologic: Cranial nerves II through XII intact, motor strength is 4/5 in right deltoid, bicep, tricep, grip, hip flexor, knee extensors, ankle dorsiflexor and plantar flexor, 0/5 on left except 3+ at trap  Tone MAS Left hamstrings Sensory exam normal sensation to light touch and proprioception in bilateral upper and lower extremities Cerebellar exam normal finger to nose to finger as well as heel to shin in bilateral upper and lower extremities Musculoskeletal: Full range of motion in all 4 extremities. No joint swelling    Assessment/Plan: 1. 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  Physiatrist is providing close team supervision and 24 hour management  of active medical problems listed below.  Physiatrist and rehab team continue to assess barriers to discharge/monitor patient progress toward functional and medical goals  Care Tool:  Bathing    Body parts bathed by patient: Left arm, Chest, Abdomen, Front perineal area, Right upper leg, Left upper leg, Face   Body parts bathed by helper: Buttocks, Right lower leg, Left lower leg, Right arm     Bathing assist Assist Level: Moderate Assistance - Patient 50 - 74%     Upper Body Dressing/Undressing Upper body dressing   What is the patient wearing?: Pull over shirt    Upper body assist Assist Level: Moderate Assistance - Patient 50 - 74%    Lower Body Dressing/Undressing Lower body dressing      What is the patient wearing?: Pants     Lower body assist Assist for lower body dressing: Moderate Assistance - Patient 50 - 74%     Toileting Toileting    Toileting assist Assist for toileting: Maximal Assistance - Patient 25 - 49%     Transfers Chair/bed transfer  Transfers assist  Chair/bed transfer activity did not occur: N/A  Chair/bed transfer assist level: Moderate Assistance - Patient 50 - 74%     Locomotion Ambulation   Ambulation assist   Ambulation activity did not occur: Safety/medical concerns  Assist level: Maximal Assistance - Patient 25 - 49% Assistive device: Other (comment)(hemi gait with R handrail) Max distance: 15   Walk 10 feet activity   Assist  Walk 10 feet activity did not occur: Safety/medical concerns  Assist level: Maximal Assistance - Patient 25 - 49% Assistive device: Other (comment)(hemi  gait with R handrail)   Walk 50 feet activity   Assist Walk 50 feet with 2 turns activity did not occur: Safety/medical concerns         Walk 150 feet activity   Assist Walk 150 feet activity did not occur: Safety/medical concerns         Walk 10 feet on uneven surface  activity   Assist Walk 10 feet on uneven surfaces  activity did not occur: Safety/medical concerns         Wheelchair     Assist Will patient use wheelchair at discharge?: (TBD) Type of Wheelchair: Manual Wheelchair activity did not occur: Safety/medical concerns  Wheelchair assist level: Minimal Assistance - Patient > 75% Max wheelchair distance: 50    Wheelchair 50 feet with 2 turns activity    Assist    Wheelchair 50 feet with 2 turns activity did not occur: Safety/medical concerns   Assist Level: Minimal Assistance - Patient > 75%   Wheelchair 150 feet activity     Assist Wheelchair 150 feet activity did not occur: Safety/medical concerns        Medical Problem List and Plan: 1. 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 DVT Prophylaxis/Anticoagulation: Pharmaceutical:Lovenox instead of heparin, to reduce needle sticks and increase efficacy in post stroke population3. Pain Management:tylenol prn 4. Mood:Denies anxiety or depression. Monitor for now. LCSW to follow for evaluation and support. 5. Neuropsych: This patientis not fullycapable of making decisions on hisown behalf. 6. Skin/Wound Care:routine pressure relief measures. 7. Fluids/Electrolytes/Nutrition:Monitor I/O. Check lytes in am. 8. KCM:[id number] BID. Continue metoprolol and catapres. Vitals:   01/18/18 1933 01/19/18 0307  BP: (!) 135/116 (!) 121/98  Pulse: (!) 57 60  Resp: 16 19  Temp: 98.4 F (36.9 C) 98 F (36.7 C)  SpO2: 100% 100%  Blood pressure control this morning, mild brady on metoprolol 25mg  BID 9. Pancytopenia:Has resolved post Granix--d/c neutropenic precautions.Leukocytosis may be related to granix will recheck in a.m. 10. Prediabetes: Hgb A1c- 6.4. Monitor BS ac/hs and use SSI for elevated BS 11. Testicular cancer:Dr. Magrinatrecommends no further chemo and monitoring AFP/HCG every 6 months X 2  years.  12. Hypertriglyceridemia:On lipitor. 13. Depression: Was on Zyprexa for mood stabilization.  Now on trazodone  (also helping with excess drooling)   LOS: 4 days A FACE TO FACE EVALUATION WAS PERFORMED  [name] 01/19/2018, 7:22 AM

## 2018-01-19 NOTE — Progress Notes (Signed)
   01/19/18 1200  Clinical Encounter Type  Visited With Patient;Health care provider  Visit Type Initial;Social support  Referral From Nurse  Spiritual Encounters  Spiritual Needs Emotional  Stress Factors  Patient Stress Factors Major life changes;Loss of control;Health changes   Met w/ pt in response to [facility] consult request for AD entered by RN on 01/15/18 at 1611.  Pt at first did not respond to any of my queries.  He was sitting in wheelchair next to bed and tech had just opened containers on lunch tray for him.  Pt shrugged when I asked if he still wanted to talk about Advance Directives.  Pt indicated that he was having a bad day and experiencing pain in his L hand.  Asked pt some questions (has 86 yo son, 2 cats at home), then he asked what I wanted and I explained again that I was the chaplain and had received a request that he wanted to talk about Advance Directives.  He asked about coming back tomorrow in hopes he might feel better.  Reported this to care RN Santiago Glad and let her know to enter new consult tomorrow if he desired it; omit if not.  Myra Gianotti resident, [phone]

## 2018-01-20 ENCOUNTER — Inpatient Hospital Stay (HOSPITAL_COMMUNITY): Payer: Medicaid Other | Admitting: Speech Pathology

## 2018-01-20 ENCOUNTER — Inpatient Hospital Stay (HOSPITAL_COMMUNITY): Payer: Medicaid Other | Admitting: Physical Therapy

## 2018-01-20 ENCOUNTER — Inpatient Hospital Stay (HOSPITAL_COMMUNITY): Payer: Medicaid Other

## 2018-01-20 ENCOUNTER — Inpatient Hospital Stay (HOSPITAL_COMMUNITY): Payer: Medicaid Other | Admitting: Occupational Therapy

## 2018-01-20 LAB — GLUCOSE, CAPILLARY
GLUCOSE-CAPILLARY: 112 mg/dL — AB (ref 70–99)
GLUCOSE-CAPILLARY: 119 mg/dL — AB (ref 70–99)
Glucose-Capillary: 116 mg/dL — ABNORMAL HIGH (ref 70–99)

## 2018-01-20 MED ORDER — BOOST / RESOURCE BREEZE PO LIQD CUSTOM
1.0000 | Freq: Three times a day (TID) | ORAL | Status: DC
  Administered 2018-01-20 – 2018-01-27 (×12): 1 via ORAL
  Filled 2018-01-20 (×5): qty 1

## 2018-01-20 MED ORDER — TRAZODONE HCL 50 MG PO TABS
75.0000 mg | ORAL_TABLET | Freq: Every day | ORAL | Status: DC
  Administered 2018-01-20 – 2018-01-22 (×3): 75 mg via ORAL
  Filled 2018-01-20 (×3): qty 2

## 2018-01-20 NOTE — Progress Notes (Signed)
[place] PHYSICAL MEDICINE & REHABILITATION PROGRESS NOTE   Subjective/Complaints:   Discussed neuropsych eval , pt with poor sleep   ROS- no CP, SOB, N/V/D   Objective:   Dg Swallowing Func-speech Pathology  Result Date: 01/19/2018 Objective Swallowing Evaluation: Type of Study: MBS-Modified Barium Swallow Study  Patient Details Name: Caleb Hubbard MRN: [id number] Date of Birth: 30-May-1980 Today's Date: 01/19/2018 Time: SLP Start Time (ACUTE ONLY): 0920 -SLP Stop Time (ACUTE ONLY): 0930 SLP Time Calculation (min) (ACUTE ONLY): 10 min Past Medical History: Past Medical History: Diagnosis Date . Anxiety  . Cancer ([place])  . Headache  . Hypertension  . Testicular mass  Past Surgical History: Past Surgical History: Procedure Laterality Date . IR ANGIO INTRA EXTRACRAN SEL COM CAROTID INNOMINATE BILAT MOD SED  01/03/2018 . IR ANGIO VERTEBRAL SEL VERTEBRAL UNI L MOD SED  01/03/2018 . IR CT HEAD LTD  01/03/2018 . IR INTRA CRAN STENT  01/03/2018 . IR PERCUTANEOUS ART THROMBECTOMY/INFUSION INTRACRANIAL INC DIAG ANGIO  01/03/2018 . NO PAST SURGERIES   . ORCHIECTOMY Right 06/17/2017  Procedure: RIGHT RADICAL ORCHIECTOMY;  Surgeon: Ceasar Mons, MD;  Location: [facility];  Service: Urology;  Laterality: Right; . RADIOLOGY WITH ANESTHESIA N/A 01/03/2018  Procedure: CODE STROKE;  Surgeon: Luanne Bras, MD;  Location: [place];  Service: Radiology;  Laterality: N/A; HPI: [name] is a 37 y.o. male with history of renal insufficiency and testicular cancer with lymph node metastasis who presented with AMS, rigors and dystonic posturing. He did not receive IV t-PA due to unknown time of onset. 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 Pt intubated 11/23 to 11/26.  Subjective: pt alert and interactive, still very hoarse Assessment / Plan / Recommendation CHL IP  CLINICAL IMPRESSIONS 01/19/2018 Clinical Impression Pt presents with mild oropharyngeal dysphagia. Oral phase is c/b decreased bolus cohesion and mildly increased mastication of peaches as well as chewing the barium tablet. Pharyngeal phase is c/b sensory impairment that results in consistently delayed swallow initiation to pyriform sinuses with intermittent penetration during swallow when consuming thin liquids. No aspiration was observed. Recommend continuing dysphagia 2 diet with thin liquids (via cup or straw), medicine crushed with intermittent suprvision. Of note, pt is known to this Probation officer. Pt continues to present with fluctuating verbal communication and overall mentation. During this study pt was initially nonverbal and used gestures. Support provided that Pt would benefit from being the person he was "when his grandmother was present on weekend." Pt immediately began verbalizing and interacting more appropriately. However pt continues to use immature language and intonation. He described the barium as "bad juice" etc.  SLP Visit Diagnosis Dysphagia, oropharyngeal phase (R13.12) Attention and concentration deficit following -- Frontal lobe and executive function deficit following -- Impact on safety and function Mild aspiration risk   CHL IP TREATMENT RECOMMENDATION 01/19/2018 Treatment Recommendations Therapy as outlined in treatment plan below   Prognosis 01/09/2018 Prognosis for Safe Diet Advancement Good Barriers to Reach Goals Severity of deficits Barriers/Prognosis Comment -- CHL IP DIET RECOMMENDATION 01/19/2018 SLP Diet Recommendations Dysphagia 2 (Fine chop) solids;Thin liquid Liquid Administration via Cup;Straw Medication Administration Crushed with puree Compensations Minimize environmental distractions;Slow rate;Small sips/bites Postural Changes Seated upright at 90 degrees   CHL IP OTHER RECOMMENDATIONS 01/19/2018 Recommended Consults -- Oral Care Recommendations Oral care BID Other Recommendations  --   CHL IP FOLLOW UP RECOMMENDATIONS 01/19/2018  Follow up Recommendations Inpatient Rehab   CHL IP FREQUENCY AND DURATION 01/13/2018 Speech Therapy Frequency (ACUTE ONLY) min 2x/week Treatment Duration --      CHL IP ORAL PHASE 01/19/2018 Oral Phase Impaired Oral - Pudding Teaspoon -- Oral - Pudding Cup -- Oral - Honey Teaspoon -- Oral - Honey Cup -- Oral - Nectar Teaspoon WFL Oral - Nectar Cup WFL Oral - Nectar Straw NT Oral - Thin Teaspoon Premature spillage;Decreased bolus cohesion Oral - Thin Cup Premature spillage;Decreased bolus cohesion Oral - Thin Straw Premature spillage;Decreased bolus cohesion Oral - Puree Lingual pumping Oral - Mech Soft Reduced posterior propulsion;Weak lingual manipulation;Lingual pumping;Delayed oral transit;Decreased bolus cohesion Oral - Regular NT Oral - Multi-Consistency -- Oral - Pill Weak lingual manipulation;Decreased bolus cohesion;Delayed oral transit Oral Phase - Comment --  CHL IP PHARYNGEAL PHASE 01/19/2018 Pharyngeal Phase Impaired Pharyngeal- Pudding Teaspoon -- Pharyngeal -- Pharyngeal- Pudding Cup -- Pharyngeal -- Pharyngeal- Honey Teaspoon -- Pharyngeal -- Pharyngeal- Honey Cup -- Pharyngeal -- Pharyngeal- Nectar Teaspoon Pharyngeal residue - pyriform Pharyngeal -- Pharyngeal- Nectar Cup Delayed swallow initiation-pyriform sinuses Pharyngeal -- Pharyngeal- Nectar Straw NT Pharyngeal -- Pharyngeal- Thin Teaspoon Delayed swallow initiation-pyriform sinuses;Penetration/Aspiration during swallow Pharyngeal Material enters airway, remains ABOVE vocal cords then ejected out Pharyngeal- Thin Cup Delayed swallow initiation-pyriform sinuses;Penetration/Aspiration during swallow Pharyngeal Material enters airway, remains ABOVE vocal cords then ejected out Pharyngeal- Thin Straw Delayed swallow initiation-pyriform sinuses;Penetration/Aspiration during swallow Pharyngeal Material enters airway, remains ABOVE vocal cords then ejected out Pharyngeal- Puree Delayed swallow  initiation-vallecula Pharyngeal -- Pharyngeal- Mechanical Soft Delayed swallow initiation-vallecula Pharyngeal -- Pharyngeal- Regular -- Pharyngeal -- Pharyngeal- Multi-consistency -- Pharyngeal -- Pharyngeal- Pill (No Data) Pharyngeal -- Pharyngeal Comment --  CHL IP CERVICAL ESOPHAGEAL PHASE 01/19/2018 Cervical Esophageal Phase WFL Pudding Teaspoon -- Pudding Cup -- Honey Teaspoon -- Honey Cup -- Nectar Teaspoon -- Nectar Cup -- Nectar Straw -- Thin Teaspoon -- Thin Cup -- Thin Straw -- Puree -- Mechanical Soft -- Regular -- Multi-consistency -- Pill -- Cervical Esophageal Comment -- Happi Overton 01/19/2018, 10:20 AM              Recent Labs    01/19/18 0628  WBC 12.9*  HGB 8.5*  HCT 27.0*  PLT 725*   Recent Labs    01/19/18 0628  NA 136  K 4.7  CL 96*  CO2 29  GLUCOSE 106*  BUN 20  CREATININE 1.17  CALCIUM 10.0    Intake/Output Summary (Last 24 hours) at 01/20/2018 0744 Last data filed at 01/19/2018 1719 Gross per 24 hour  Intake 378 ml  Output -  Net 378 ml     Physical Exam: Vital Signs Blood pressure (!) 132/101, pulse 99, temperature 98.1 F (36.7 C), temperature source Oral, resp. rate 15, height 5\' 11"  (1.803 m), weight 55 kg, SpO2 100 %.  General: No acute distress Mood and affect are appropriate Heart: Regular rate and rhythm no rubs murmurs or extra sounds Lungs: Clear to auscultation, breathing unlabored, no rales or wheezes Abdomen: Positive bowel sounds, soft nontender to palpation, nondistended Extremities: No clubbing, cyanosis, or edema Skin: No evidence of breakdown, no evidence of rash Neurologic: Cranial nerves II through XII intact, motor strength is 4/5 in right deltoid, bicep, tricep, grip, hip flexor, knee extensors, ankle dorsiflexor and plantar flexor, 0/5 on left except 3+ at trap  Tone MAS Left hamstrings Sensory exam normal sensation to light touch and proprioception in bilateral upper and lower extremities Cerebellar exam normal finger to  nose to finger  as well as heel to shin in bilateral upper and lower extremities Musculoskeletal: Full range of motion in all 4 extremities. No joint swelling    Assessment/Plan: 1. 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  Physiatrist is providing close team supervision and 24 hour management of active medical problems listed below.  Physiatrist and rehab team continue to assess barriers to discharge/monitor patient progress toward functional and medical goals  Care Tool:  Bathing    Body parts bathed by patient: Left arm, Chest, Abdomen, Front perineal area, Right upper leg, Left upper leg, Face   Body parts bathed by helper: Buttocks, Right lower leg, Left lower leg, Right arm     Bathing assist Assist Level: Moderate Assistance - Patient 50 - 74%     Upper Body Dressing/Undressing Upper body dressing   What is the patient wearing?: Pull over shirt    Upper body assist Assist Level: Moderate Assistance - Patient 50 - 74%    Lower Body Dressing/Undressing Lower body dressing      What is the patient wearing?: Pants     Lower body assist Assist for lower body dressing: Moderate Assistance - Patient 50 - 74%     Toileting Toileting    Toileting assist Assist for toileting: Maximal Assistance - Patient 25 - 49%     Transfers Chair/bed transfer  Transfers assist  Chair/bed transfer activity did not occur: N/A  Chair/bed transfer assist level: Moderate Assistance - Patient 50 - 74%     Locomotion Ambulation   Ambulation assist   Ambulation activity did not occur: Safety/medical concerns  Assist level: Maximal Assistance - Patient 25 - 49% Assistive device: Other (comment)(hemi gait with R handrail) Max distance: 15   Walk 10 feet activity   Assist  Walk 10 feet activity did not occur:  Safety/medical concerns  Assist level: Maximal Assistance - Patient 25 - 49% Assistive device: Other (comment)(hemi gait with R handrail)   Walk 50 feet activity   Assist Walk 50 feet with 2 turns activity did not occur: Safety/medical concerns         Walk 150 feet activity   Assist Walk 150 feet activity did not occur: Safety/medical concerns         Walk 10 feet on uneven surface  activity   Assist Walk 10 feet on uneven surfaces activity did not occur: Safety/medical concerns         Wheelchair     Assist Will patient use wheelchair at discharge?: (TBD) Type of Wheelchair: Manual Wheelchair activity did not occur: Safety/medical concerns  Wheelchair assist level: Minimal Assistance - Patient > 75% Max wheelchair distance: 50    Wheelchair 50 feet with 2 turns activity    Assist    Wheelchair 50 feet with 2 turns activity did not occur: Safety/medical concerns   Assist Level: Minimal Assistance - Patient > 75%   Wheelchair 150 feet activity     Assist Wheelchair 150 feet activity did not occur: Safety/medical concerns        Medical Problem List and Plan: 1. 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 DVT Prophylaxis/Anticoagulation: Pharmaceutical:Lovenox instead of heparin, to reduce needle sticks and increase efficacy in post stroke population3. Pain Management:tylenol prn 4. Mood:Denies anxiety or depression.  Monitor for now. LCSW to follow for evaluation and support. 5. Neuropsych: This patientis not fullycapable of making decisions on hisown behalf. 6. Skin/Wound Care:routine pressure relief measures. 7. Fluids/Electrolytes/Nutrition:Monitor I/O. Check lytes in am. 8. YBO:FBPZWCH BID. Continue metoprolol and catapres. Vitals:   01/19/18 2018 01/20/18 0503  BP: 123/83 (!) 132/101  Pulse: (!)  104 99  Resp: 12 15  Temp: 98.2 F (36.8 C) 98.1 F (36.7 C)  SpO2: 100% 100%  Blood pressure labile HR mildly elevated monitor for pattern  9. Pancytopenia:Resolved Has resolved post Granix--d/c neutropenic precautions.Leukocytosis may be related to granix will recheck in a.m. 10. Prediabetes: Hgb A1c- 6.4. Monitor BS ac/hs and use SSI for elevated BS 11. Testicular cancer:Dr. Magrinatrecommends no further chemo and monitoring AFP/HCG every 6 months X 2 years.  12. Hypertriglyceridemia:On lipitor. 13. Depression: Was on Zyprexa for mood stabilization.  Now on trazodone  (also helping with excess drooling) Neuropsych is following, poor sleep increase trazodone  LOS: 5 days A FACE TO FACE EVALUATION WAS PERFORMED  [name] 01/20/2018, 7:44 AM

## 2018-01-20 NOTE — Progress Notes (Signed)
Nutrition Follow-up  DOCUMENTATION CODES:  Underweight  INTERVENTION:   - Boost Breeze po TID, each supplement provides 250 kcal and 9 grams of protein  - Continue Pro-stat 30 ml BID, each supplement provides 100 kcal and 15 grams of protein  - Continue MVI with minerals daily  - Took pt's food preferences  NUTRITION DIAGNOSIS:   Inadequate oral intake related to acute illness as evidenced by energy intake < 75% for > 7 days, estimated needs.  Ongoing  GOAL:   Patient will meet greater than or equal to 90% of their needs  Progressing  MONITOR:   PO intake, Labs, Diet advancement, Supplement acceptance, Weight trends  REASON FOR ASSESSMENT:   Malnutrition Screening Tool    ASSESSMENT:   Pt with PMH of testicular cancer with metastasis to lymph - s/p right radical orchiectomy 06/17/17 and retroperitoneal lyph node dissection 11/02/17.  Now s/p 1 of 2 planned cycles of cisplatin / etoposide (12/19/17 through 01/01/18 at [facility]). 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  Discussed pt with RN who reports pt is eating on and off. Noted ~75% completed lunch tray in room at time of visit. Per RN, pt ate lunch with SLP.  Spoke with pt at bedside. Pt not very interactive at first but did answer RD questions with one- or two-word answers. RD obtained food preferences and entered these into [employer] software. Pt mentions specifically liking BBQ and Sprite.  Noted pt with 10 lb weight loss since admission to CIR. Pt now meets criteria for underweight BMI. Will continue to monitor weight trends during admission and make recommendations so that pt can meet his nutritional needs. Unsure exactly what is contributing to this weight loss; suspect related in part to increased nutrient needs with therapy and decreased PO intake.  Meal Completion: 30-75% x last 8 recorded meals  Medications reviewed and include: Pro-stat BID, MVI with minerals, Protonix  Labs  reviewed. CBG's: 112, 116, 114, 89 x 24 hours  Diet Order:   Diet Order            DIET DYS 2 Room service appropriate? Yes; Fluid consistency: Thin  Diet effective now              EDUCATION NEEDS:   No education needs have been identified at this time  Skin:  Skin Assessment: Reviewed RN Assessment  Last BM:  12/9 (medium type 4)  Height:   Ht Readings from Last 1 Encounters:  01/15/18 5\' 11"  (1.803 m)    Weight:   Wt Readings from Last 1 Encounters:  01/20/18 55 kg    Ideal Body Weight:  78.1 kg  BMI:  Body mass index is 16.92 kg/m.  Estimated Nutritional Needs:   Kcal:  2100-2300  Protein:  100-115 grams  Fluid:  2.1-2.3 L    Gaynell Face, MS, RD, LDN Inpatient Clinical Dietitian Pager: [phone] Weekend/After Hours: [phone]

## 2018-01-20 NOTE — Plan of Care (Signed)
  Problem: Spiritual Needs Goal: Ability to function at adequate level Outcome: Progressing   Problem: Consults Goal: RH STROKE PATIENT EDUCATION Description See Patient Education module for education specifics  Outcome: Progressing Goal: Nutrition Consult-if indicated Outcome: Progressing   Problem: RH BOWEL ELIMINATION Goal: RH STG MANAGE BOWEL WITH ASSISTANCE Description STG Manage Bowel with min Assistance.  Outcome: Progressing Goal: RH STG MANAGE BOWEL W/MEDICATION W/ASSISTANCE Description STG Manage Bowel with Medication with min Assistance.  Outcome: Progressing   Problem: RH SKIN INTEGRITY Goal: RH STG SKIN FREE OF INFECTION/BREAKDOWN Description Patients skin will remain free from further infection or breakdown with mod assist.   Outcome: Progressing Goal: RH STG MAINTAIN SKIN INTEGRITY WITH ASSISTANCE Description STG Maintain Skin Integrity With mod Assistance.  Outcome: Progressing   Problem: RH SAFETY Goal: RH STG ADHERE TO SAFETY PRECAUTIONS W/ASSISTANCE/DEVICE Description STG Adhere to Safety Precautions With min Assistance/Device.  Outcome: Progressing   Problem: RH COGNITION-NURSING Goal: RH STG USES MEMORY AIDS/STRATEGIES W/ASSIST TO PROBLEM SOLVE Description STG Uses Memory Aids/Strategies With min Assistance to Problem Solve.  Outcome: Progressing   Problem: RH KNOWLEDGE DEFICIT Goal: RH STG INCREASE KNOWLEDGE OF HYPERTENSION Description Min assist  Outcome: Progressing Goal: RH STG INCREASE KNOWLEDGE OF DYSPHAGIA/FLUID INTAKE Description Min assist  Outcome: Progressing Goal: RH STG INCREASE KNOWLEGDE OF HYPERLIPIDEMIA Description Min assist  Outcome: Progressing Goal: RH STG INCREASE KNOWLEDGE OF STROKE PROPHYLAXIS Description Min assist  Outcome: Progressing

## 2018-01-20 NOTE — Progress Notes (Signed)
Speech Language Pathology Daily Session Note  Patient Details  Name: Caleb Hubbard MRN: [id number] Date of Birth: 1980-09-07  Today's Date: 01/20/2018 SLP Individual Time: 1130-1200 SLP Individual Time Calculation (min): 30 min  Short Term Goals: Week 1: SLP Short Term Goal 1 (Week 1): Pt will utilize external memory aids to recall daily information with supervision cues.  SLP Short Term Goal 2 (Week 1): Pt will complex semi-complex problem solving tasks with Min A cues.  SLP Short Term Goal 3 (Week 1): Pt will demonstrate selective attention in mildly distracting environment for ~ 30 minutes with Min A cues.  SLP Short Term Goal 4 (Week 1): Pt will utilize speech intelligibility strategies to achieve ~ 90% speech intelligibility at the sentence level.  SLP Short Term Goal 5 (Week 1): Pt will consume current diet with minimal overt s/s of aspiration and supervision cues for use of compensatory strategies.   Skilled Therapeutic Interventions: Skilled treatment session focused on dysphagia and speech goals. SLP facilitated session by providing Min-Mod A verbal cues for use of small bites/sips, a slow rate of self-feeding and to clear right buccal pocketing with lunch meal of Dys. 2 textures with thin liquids. Patient with prolonged mastication and oral clearance of solid textures with explosive cough X 1 with thin liquids via straw, suspect due to large bolus size. Overt s/s of aspiration eliminated when straw was removed and patient utilized small sips. SLP also facilitated session by providing Min-Mod A verbal cues for use of an increased vocal intensity to achieve ~75% intelligibility at the phrase and sentence level. Patient left upright in bed with alarm on and all needs within reach. Continue with current plan of care.      Pain Pain Assessment Pain Scale: 0-10 Pain Score: 0-No pain  Therapy/Group: Individual Therapy  Caleb Hubbard 01/20/2018, 3:11 PM

## 2018-01-20 NOTE — Progress Notes (Signed)
Physical Therapy Session Note  Patient Details  Name: Caleb Hubbard MRN: [id number] Date of Birth: 1980/12/24  Today's Date: 01/20/2018 PT Individual Time: 1500-1610 PT Individual Time Calculation (min): 70 min   Short Term Goals: Week 1:  PT Short Term Goal 1 (Week 1): Pt will initiate gait training PT Short Term Goal 2 (Week 1): Pt will initiate w/c mobility PT Short Term Goal 3 (Week 1): Pt will maintain static standing balance with min A  Skilled Therapeutic Interventions/Progress Updates:    Pt supine in bed upon PT arrival, agreeable to therapy tx and denies pain. Pt transferred to sitting EOB with min assist and performed stand pivot to w/c with mod assist. Pt transported to the gym. Pt performed sit<>stands at the rail this session x 6 with min assist, pulling up with R UE, therapist blocking L knee in standing. Pt worked on L LE stance control and weightbearing while stepping in place with R LE 2 x 5 steps. Pt worked on standing balance without UE support to perform reaching/throwing task with horseshoes x 2 trials. Pt ambulated x 30 ft this session using R rail for UE support, mod assist, therapist advance L LE during swing (ace wrap used for DF foot clearance) and therapist blocking L LE total assist during L stance. Therapist provided pt with basic w/c cushion this session to allow for increased independence with w/c mobility, pt propelled w/c using R hemi technique x 50 ft with supervision and verbal cues for techniques. Pt transferred from w/c<>nustep with mod assist stand pivot, pt used nustep on workload 3 for reciprocal extremity movement and neuro re-ed, L hand attachment used and therapist providing facilitation for L knee positioning. Therapist educating pt on stroke recovery, therapist also encouraged pt to stay up in w/c however pt requests to get in bed. Stand pivot from w/c>bed mod assist, left supine with needs in reach and bed alarm set, sister present. Pt very discouraged and  flat throughout session, therapist providing max encouragement for all tasks.   Therapy Documentation Precautions:  Precautions Precautions: Fall Precaution Comments: corretrack Restrictions Weight Bearing Restrictions: No   Therapy/Group: Individual Therapy  Netta Corrigan, PT, DPT 01/20/2018, 8:00 AM

## 2018-01-20 NOTE — Progress Notes (Signed)
Occupational Therapy Session Note  Patient Details  Name: Caleb Hubbard MRN: [id number] Date of Birth: 1980-08-09  Today's Date: 01/20/2018 OT Individual Time: 1000-1027 OT Individual Time Calculation (min): 27 min  and Today's Date: 01/20/2018 OT Missed Time: 33 Minutes Missed Time Reason: Patient unwilling/refused to participate without medical reason;Patient fatigue   Short Term Goals: Week 1:  OT Short Term Goal 1 (Week 1): Pt will transfer to BSC/toilet consistantly iwht Min A and LRAD OT Short Term Goal 2 (Week 1): Pt will don shirt wiht min A OT Short Term Goal 3 (Week 1): Pt will manage LUE prior to transfers wiht min VC OT Short Term Goal 4 (Week 1): pt will recall hemi dressing techniques with min VC OT Short Term Goal 5 (Week 1): Pt will thread BLE into pants with min A  Skilled Therapeutic Interventions/Progress Updates:    Treatment session with focus on ADL retraining with education on hemi-technique.  Pt received upright in w/c declining any bathing this session, but agreeable to changing clothing.  Pt requested to complete dressing from EOB and bed level.  Squat pivot transfer mod assist to EOB with cues for anterior weight shift.  Therapist educated on hemi-dressing technique with shirt, providing hand over hand assistance to complete dressing due to visual deficits and decreased processing.  Incorporated reaching, visual scanning, and trunk rotation into dressing task when incorporating reaching to gather clothing items.  Pt hesitant with reaching due to fear of falling.  Donned shorts at bed level with pt demonstrating decreased initiation and participation requiring total assistance.  Mod multimodal cues for sequencing and bed mobility to pull shorts over hips.  Attempted to engage pt in scapular mobilizations in sidelying, however pt falling asleep and shaking head "no" to any further therapeutic intervention at this time.  Pt left in supine with all needs in reach,  asleep.  Therapy Documentation Precautions:  Precautions Precautions: Fall Precaution Comments: corretrack Restrictions Weight Bearing Restrictions: No General: General OT Amount of Missed Time: 33 Minutes Pain: Pain Assessment Pain Scale: 0-10 Pain Score: 0-No pain   Therapy/Group: Individual Therapy  Simonne Come 01/20/2018, 12:16 PM

## 2018-01-20 NOTE — Progress Notes (Signed)
Physical Therapy Session Note  Patient Details  Name: Caleb Hubbard MRN: [id number] Date of Birth: 01-27-1981  Today's Date: 01/20/2018 PT Individual Time: 0900-0930 PT Individual Time Calculation (min): 30 min   Short Term Goals: Week 1:  PT Short Term Goal 1 (Week 1): Pt will initiate gait training PT Short Term Goal 2 (Week 1): Pt will initiate w/c mobility PT Short Term Goal 3 (Week 1): Pt will maintain static standing balance with min A  Skilled Therapeutic Interventions/Progress Updates:    Patient received in bed, very pleasant and willing to work with PT today; able to roll side to side in bed with S- min guard for application of underwear/pants and then completed sidelying to sit with ModA, required heavy ModA for donning shirt at side of bed. Able to perform squat pivot transfer to R with ModA and was left up in the [facility] positioned to comfort with seat belt alarm activated and all needs met this morning.   Therapy Documentation Precautions:  Precautions Precautions: Fall Precaution Comments: corretrack Restrictions Weight Bearing Restrictions: No   Pain: Pain Assessment Pain Scale: 0-10 Pain Score: 0-No pain    Therapy/Group: Individual Therapy  Deniece Ree PT, DPT, CBIS  Supplemental Physical Therapist [facility]    Pager [phone] Acute Rehab Office [phone]   01/20/2018, 12:28 PM

## 2018-01-21 ENCOUNTER — Inpatient Hospital Stay (HOSPITAL_COMMUNITY): Payer: Medicaid Other | Admitting: Speech Pathology

## 2018-01-21 ENCOUNTER — Inpatient Hospital Stay (HOSPITAL_COMMUNITY): Payer: Medicaid Other | Admitting: Physical Therapy

## 2018-01-21 ENCOUNTER — Inpatient Hospital Stay (HOSPITAL_COMMUNITY): Payer: Medicaid Other | Admitting: Occupational Therapy

## 2018-01-21 ENCOUNTER — Encounter (HOSPITAL_COMMUNITY): Payer: Medicaid Other | Admitting: Psychology

## 2018-01-21 LAB — CBC
HCT: 28.1 % — ABNORMAL LOW (ref 39.0–52.0)
Hemoglobin: 8.9 g/dL — ABNORMAL LOW (ref 13.0–17.0)
MCH: 31 pg (ref 26.0–34.0)
MCHC: 31.7 g/dL (ref 30.0–36.0)
MCV: 97.9 fL (ref 80.0–100.0)
Platelets: 859 10*3/uL — ABNORMAL HIGH (ref 150–400)
RBC: 2.87 MIL/uL — ABNORMAL LOW (ref 4.22–5.81)
RDW: 15.9 % — ABNORMAL HIGH (ref 11.5–15.5)
WBC: 15.5 10*3/uL — ABNORMAL HIGH (ref 4.0–10.5)
nRBC: 0.1 % (ref 0.0–0.2)

## 2018-01-21 MED ORDER — GABAPENTIN 100 MG PO CAPS
100.0000 mg | ORAL_CAPSULE | Freq: Every day | ORAL | Status: DC
  Administered 2018-01-21 – 2018-02-08 (×19): 100 mg via ORAL
  Filled 2018-01-21 (×19): qty 1

## 2018-01-21 MED ORDER — TIZANIDINE HCL 4 MG PO TABS
4.0000 mg | ORAL_TABLET | Freq: Every day | ORAL | Status: DC
  Administered 2018-01-21 – 2018-02-08 (×19): 4 mg via ORAL
  Filled 2018-01-21 (×20): qty 1

## 2018-01-21 MED ORDER — METHYLPHENIDATE HCL 5 MG PO TABS
5.0000 mg | ORAL_TABLET | Freq: Two times a day (BID) | ORAL | Status: DC
  Administered 2018-01-21 – 2018-01-27 (×12): 5 mg via ORAL
  Filled 2018-01-21 (×12): qty 1

## 2018-01-21 NOTE — Progress Notes (Addendum)
Physical Therapy Session Note  Patient Details  Name: Caleb Hubbard MRN: [id number] Date of Birth: 1980/10/21  Today's Date: 01/21/2018 PT Individual Time: 1106-1200 PT Individual Time Calculation (min): 54 min   Short Term Goals: Week 1:  PT Short Term Goal 1 (Week 1): Pt will initiate gait training PT Short Term Goal 2 (Week 1): Pt will initiate w/c mobility PT Short Term Goal 3 (Week 1): Pt will maintain static standing balance with min A  Skilled Therapeutic Interventions/Progress Updates:    Pt received in bed. Pt with limited engagement with therapist and environment throughout session, requiring cuing to look up, verbally communicate (pt did so very little) and communicate at an audible volume with poor return demo throughout despite max encouragement. Pt transfers to EOB with max assist due to little participation in task. Pt requires RUE support and close supervision<>max assist for sitting EOB as pt with little engagement in task, at one point letting go of bed rail and falling backwards on bed rail slightly bumping head (RN made aware). Pt found to be incontinent of small amount of urine although pt denied this. Pt transferred sit>stand at EOB with RUE support and mod assist to allow therapist to doff soiled brief & don clean one total assist. Therapist dons pants from EOB & sit<>stand with total assist, and dons B socks & shoes although pt was encouraged and educated on how to don R sock pt did not attempt despite max cuing & encouragement. At rail in hallway pt transfers sit>stand with mod<>max assist and ambulates 25 ft + 25 ft with max assist with total assist for advancing LLE, blocking A/P at L knee to prevent instability, and weight shifting L during step RLE with MAX cuing for step through RLE with only fair return demo. Pt propels w/c gym>dayroom with R hemi technique, supervision and significantly extra time, requiring cuing to look up at surrounding environment and for obstacle  avoidance. Pt set up at [facility] from w/c level and utilized device with task focusing on BLE strengthening & L NMR. Pt propels w/c back to room with max encouragement. At end of session pt left sitting in w/c with chair alarm donned & call bell in reach.   No behaviors demonstrating pain during session.  Therapy Documentation Precautions:  Precautions Precautions: Fall Precaution Comments: corretrack Restrictions Weight Bearing Restrictions: No    Therapy/Group: Individual Therapy  [name] 01/21/2018, 12:17 PM

## 2018-01-21 NOTE — Progress Notes (Signed)
Social Work Patient ID: Caleb Hubbard, male   DOB: 10/20/1980, 37 y.o.   MRN: [id number] Spoke with Caleb Hubbard via telephone to inform team conference goals min assist level and target discharge 12/28. She report she will be in [place] the week of Christmas and will not be back until the 29th. Discussed can see if can move his date. She states: " Why would you do that?" She was told his discharge date will change with every team conference on Wed. She plans to be here tomorrow will talk with her tomorrow and ask her to go to therapies with pt. Work on discharge needs. Informed pt of target discharge date also.

## 2018-01-21 NOTE — Patient Care Conference (Signed)
Inpatient RehabilitationTeam Conference and Plan of Care Update Date: 01/21/2018   Time: 10:55 AM    Patient Name: Caleb Hubbard      Medical Record Number: [id number]  Date of Birth: 04-02-1980 Sex: Male         Room/Bed: 4W14C/4W14C-01 Payor Info: Payor: MEDICAID [place] / Plan: MEDICAID [place]-FAMILY PLANNING / Product Type: *No Product type* /    Admitting Diagnosis: CVA  Admit Date/Time:  01/15/2018  3:26 PM Admission Comments: No comment available   Primary Diagnosis:  <principal problem not specified> Principal Problem: <principal problem not specified>  Patient Active Problem List   Diagnosis Date Noted  . Reactive depression   . 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 Cancer ([place])   . Endotracheally intubated   . 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 Alysia Penna Social Worker Present: Ovidio Kin, LCSW Nurse Present: Rayetta Pigg, RN PT Present: Lavone Nian, PT OT Present: Simonne Come, OT SLP Present: Stormy Fabian, SLP PPS Coordinator present : Gunnar Fusi;Daiva Nakayama, RN, CRRN     Current Status/Progress Goal Weekly Team Focus  Medical   Increased pain left upper and left lower limb.  Has spasticity in left upper left lower limb independent of pain as well  Maintain medical stability, reduce fall risk  Medication adjustment for neurogenic pain   Bowel/Bladder   continent of bowel periods of voiding incontinence. LBM 12/9  Few episodes of incontinence, offer assistance with tyoileting q2h   Assess bowel and bladder needs qshift and PRN   Swallow/Nutrition/ Hydration   Max A with dysphagia 2 and thin lqiuids  Mod A with least restrictive  diet - downgraded 12/11 -  d/t pyschological behaviors  completion of MBS and upgrade to thin liquids   ADL's   mod-total assist dressing tasks, mod assist transfers.   Impaired vision and sustained attention impacting participation as well as depressed mood.   Contact guard - Min assist  ADL retraining, transfers, increased participation, pt/family education   Mobility   max assist gait at rail, mod assist transfers, very poor engagement in therapy, supervision w/c mobility, flaccid LLE  min assist gait & stairs, supervision w/c  L NMR, standing/sitting balance, transfers, bed mobility, gait, w/c mobility, stairs as appropriate   Communication   Max A d/t behaviors not acute deficits  Mod A at basic conversation - downgraded 12/11 d/t pyschological behaviors  focus on pt using voice and appropriate interaction   Safety/Cognition/ Behavioral Observations  Max A d/t behaviors  Mod A - downgraded d/t pyschological behaviors  basic problem solving, selective attention   Pain   no c/o pain   Pt to remain pain free  Assess pain qshift and PRN   Skin   No skin impairments.   remain free of skin impairments. turn and reposition Pt q2  assess skin qshift and PRN       *See Care Plan and progress notes for long and short-term goals.     Barriers to Discharge  Current Status/Progress Possible Resolutions Date Resolved   Physician    Medical stability     Poor progress secondary to participation  Patient does better with family present, social work will discuss with family      Nursing                  PT  Home environment access/layout;Lack of/limited family  support  unsure of definite d/c plan and ability for family to provide necessary hands on assist at d/c              OT                  SLP                SW                Discharge Planning/Teaching Needs:  Going to Mom's home and they will come up with a 24 hr care plan for pt. May need to take FMLA. Pt depressed and being seen by neuro-psych      Team Discussion:  Goals min assist level, currently mod assist level. Has potential if will participate, neuro-psych seeing for his depression which  is limiting him in his participation. BP elevated MD is addressing. Impulsive and safety issues moves quickly. Attention and vision issues from stroke. MD prescribed neurotin tonight for pain. More continent per nurse.  Revisions to Treatment Plan:  DC 12/28    Continued Need for Acute Rehabilitation Level of Care: The patient requires daily medical management by a physician with specialized training in physical medicine and rehabilitation for the following conditions: Daily direction of a multidisciplinary physical rehabilitation program to ensure safe treatment while eliciting the highest outcome that is of practical value to the patient.: Yes Daily medical management of patient stability for increased activity during participation in an intensive rehabilitation regime.: Yes Daily analysis of laboratory values and/or radiology reports with any subsequent need for medication adjustment of medical intervention for : Neurological problems;Other   I attest that I was present, lead the team conference, and concur with the assessment and plan of the team.   Elease Hashimoto 01/21/2018, 2:05 PM

## 2018-01-21 NOTE — Progress Notes (Signed)
[place] PHYSICAL MEDICINE & REHABILITATION PROGRESS NOTE   Subjective/Complaints:   Pt c/o pain in entire LUE and LLE which is increased with movement, no recent falls  ROS- no CP, SOB, N/V/D   Objective:   Dg Swallowing Func-speech Pathology  Result Date: 01/19/2018 Objective Swallowing Evaluation: Type of Study: MBS-Modified Barium Swallow Study  Patient Details Name: Caleb Hubbard MRN: [id number] Date of Birth: 03-16-1980 Today's Date: 01/19/2018 Time: SLP Start Time (ACUTE ONLY): 0920 -SLP Stop Time (ACUTE ONLY): 0930 SLP Time Calculation (min) (ACUTE ONLY): 10 min Past Medical History: Past Medical History: Diagnosis Date . Anxiety  . Cancer ([facility])  . Headache  . Hypertension  . Testicular mass  Past Surgical History: Past Surgical History: Procedure Laterality Date . IR ANGIO INTRA EXTRACRAN SEL COM CAROTID INNOMINATE BILAT MOD SED  01/03/2018 . IR ANGIO VERTEBRAL SEL VERTEBRAL UNI L MOD SED  01/03/2018 . IR CT HEAD LTD  01/03/2018 . IR INTRA CRAN STENT  01/03/2018 . IR PERCUTANEOUS ART THROMBECTOMY/INFUSION INTRACRANIAL INC DIAG ANGIO  01/03/2018 . NO PAST SURGERIES   . ORCHIECTOMY Right 06/17/2017  Procedure: RIGHT RADICAL ORCHIECTOMY;  Surgeon: Ceasar Mons, MD;  Location: [facility];  Service: Urology;  Laterality: Right; . RADIOLOGY WITH ANESTHESIA N/A 01/03/2018  Procedure: CODE STROKE;  Surgeon: Luanne Bras, MD;  Location: [facility];  Service: Radiology;  Laterality: N/A; HPI: Caleb Hubbard is a 37 y.o. male with history of renal insufficiency and testicular cancer with lymph node metastasis who presented with AMS, rigors and dystonic posturing. He did not receive IV t-PA due to unknown time of onset. 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 Pt intubated 11/23 to 11/26.  Subjective: pt alert and interactive, still very hoarse  Assessment / Plan / Recommendation CHL IP CLINICAL IMPRESSIONS 01/19/2018 Clinical Impression Pt presents with mild oropharyngeal dysphagia. Oral phase is c/b decreased bolus cohesion and mildly increased mastication of peaches as well as chewing the barium tablet. Pharyngeal phase is c/b sensory impairment that results in consistently delayed swallow initiation to pyriform sinuses with intermittent penetration during swallow when consuming thin liquids. No aspiration was observed. Recommend continuing dysphagia 2 diet with thin liquids (via cup or straw), medicine crushed with intermittent suprvision. Of note, pt is known to this Probation officer. Pt continues to present with fluctuating verbal communication and overall mentation. During this study pt was initially nonverbal and used gestures. Support provided that Pt would benefit from being the person he was "when his grandmother was present on weekend." Pt immediately began verbalizing and interacting more appropriately. However pt continues to use immature language and intonation. He described the barium as "bad juice" etc.  SLP Visit Diagnosis Dysphagia, oropharyngeal phase (R13.12) Attention and concentration deficit following -- Frontal lobe and executive function deficit following -- Impact on safety and function Mild aspiration risk   CHL IP TREATMENT RECOMMENDATION 01/19/2018 Treatment Recommendations Therapy as outlined in treatment plan below   Prognosis 01/09/2018 Prognosis for Safe Diet Advancement Good Barriers to Reach Goals Severity of deficits Barriers/Prognosis Comment -- CHL IP DIET RECOMMENDATION 01/19/2018 SLP Diet Recommendations Dysphagia 2 (Fine chop) solids;Thin liquid Liquid Administration via Cup;Straw Medication Administration Crushed with puree Compensations Minimize environmental distractions;Slow rate;Small sips/bites Postural Changes Seated upright at 90 degrees   CHL IP OTHER RECOMMENDATIONS 01/19/2018 Recommended Consults -- Oral Care  Recommendations Oral care BID Other Recommendations --  CHL IP FOLLOW UP RECOMMENDATIONS 01/19/2018 Follow up Recommendations Inpatient Rehab   CHL IP FREQUENCY AND DURATION 01/13/2018 Speech Therapy Frequency (ACUTE ONLY) min 2x/week Treatment Duration --      CHL IP ORAL PHASE 01/19/2018 Oral Phase Impaired Oral - Pudding Teaspoon -- Oral - Pudding Cup -- Oral - Honey Teaspoon -- Oral - Honey Cup -- Oral - Nectar Teaspoon WFL Oral - Nectar Cup WFL Oral - Nectar Straw NT Oral - Thin Teaspoon Premature spillage;Decreased bolus cohesion Oral - Thin Cup Premature spillage;Decreased bolus cohesion Oral - Thin Straw Premature spillage;Decreased bolus cohesion Oral - Puree Lingual pumping Oral - Mech Soft Reduced posterior propulsion;Weak lingual manipulation;Lingual pumping;Delayed oral transit;Decreased bolus cohesion Oral - Regular NT Oral - Multi-Consistency -- Oral - Pill Weak lingual manipulation;Decreased bolus cohesion;Delayed oral transit Oral Phase - Comment --  CHL IP PHARYNGEAL PHASE 01/19/2018 Pharyngeal Phase Impaired Pharyngeal- Pudding Teaspoon -- Pharyngeal -- Pharyngeal- Pudding Cup -- Pharyngeal -- Pharyngeal- Honey Teaspoon -- Pharyngeal -- Pharyngeal- Honey Cup -- Pharyngeal -- Pharyngeal- Nectar Teaspoon Pharyngeal residue - pyriform Pharyngeal -- Pharyngeal- Nectar Cup Delayed swallow initiation-pyriform sinuses Pharyngeal -- Pharyngeal- Nectar Straw NT Pharyngeal -- Pharyngeal- Thin Teaspoon Delayed swallow initiation-pyriform sinuses;Penetration/Aspiration during swallow Pharyngeal Material enters airway, remains ABOVE vocal cords then ejected out Pharyngeal- Thin Cup Delayed swallow initiation-pyriform sinuses;Penetration/Aspiration during swallow Pharyngeal Material enters airway, remains ABOVE vocal cords then ejected out Pharyngeal- Thin Straw Delayed swallow initiation-pyriform sinuses;Penetration/Aspiration during swallow Pharyngeal Material enters airway, remains ABOVE vocal cords then  ejected out Pharyngeal- Puree Delayed swallow initiation-vallecula Pharyngeal -- Pharyngeal- Mechanical Soft Delayed swallow initiation-vallecula Pharyngeal -- Pharyngeal- Regular -- Pharyngeal -- Pharyngeal- Multi-consistency -- Pharyngeal -- Pharyngeal- Pill (No Data) Pharyngeal -- Pharyngeal Comment --  CHL IP CERVICAL ESOPHAGEAL PHASE 01/19/2018 Cervical Esophageal Phase WFL Pudding Teaspoon -- Pudding Cup -- Honey Teaspoon -- Honey Cup -- Nectar Teaspoon -- Nectar Cup -- Nectar Straw -- Thin Teaspoon -- Thin Cup -- Thin Straw -- Puree -- Mechanical Soft -- Regular -- Multi-consistency -- Pill -- Cervical Esophageal Comment -- Caleb Hubbard 01/19/2018, 10:20 AM              Recent Labs    01/19/18 0628 01/21/18 0538  WBC 12.9* 15.5*  HGB 8.5* 8.9*  HCT 27.0* 28.1*  PLT 725* 859*   Recent Labs    01/19/18 0628  NA 136  K 4.7  CL 96*  CO2 29  GLUCOSE 106*  BUN 20  CREATININE 1.17  CALCIUM 10.0    Intake/Output Summary (Last 24 hours) at 01/21/2018 0847 Last data filed at 01/20/2018 1721 Gross per 24 hour  Intake 960 ml  Output -  Net 960 ml     Physical Exam: Vital Signs Blood pressure 121/87, pulse 91, temperature 98.6 F (37 C), resp. rate 18, height 5' 11" (1.803 m), weight 56.9 kg, SpO2 100 %.  General: No acute distress Mood and affect are appropriate Heart: Regular rate and rhythm no rubs murmurs or extra sounds Lungs: Clear to auscultation, breathing unlabored, no rales or wheezes Abdomen: Positive bowel sounds, soft nontender to palpation, nondistended Extremities: No clubbing, cyanosis, or edema Skin: No evidence of breakdown, no evidence of rash Neurologic: Cranial nerves II through XII intact, motor strength is 4/5 in right deltoid, bicep, tricep, grip, hip flexor, knee extensors, ankle dorsiflexor and plantar flexor, 0/5 on left except 3+ at trap  Tone MAS 2 Left hamstrings and Left elbow flexors Sensory exam normal sensation to light touch and proprioception  in  left  lower extremities, diminished LUE Cerebellar exam normal finger to nose to finger as well as heel to shin in bilateral upper and lower extremities Musculoskeletal: Full range of motion in all 4 extremities. No joint swelling    Assessment/Plan: 1. 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  Physiatrist is providing close team supervision and 24 hour management of active medical problems listed below.  Physiatrist and rehab team continue to assess barriers to discharge/monitor patient progress toward functional and medical goals  Care Tool:  Bathing  Bathing activity did not occur: Refused Body parts bathed by patient: Left arm, Chest, Abdomen, Front perineal area, Right upper leg, Left upper leg, Face   Body parts bathed by helper: Buttocks, Right lower leg, Left lower leg, Right arm     Bathing assist Assist Level: Moderate Assistance - Patient 50 - 74%     Upper Body Dressing/Undressing Upper body dressing   What is the patient wearing?: Pull over shirt    Upper body assist Assist Level: Maximal Assistance - Patient 25 - 49%    Lower Body Dressing/Undressing Lower body dressing      What is the patient wearing?: Pants     Lower body assist Assist for lower body dressing: Total Assistance - Patient < 25%     Toileting Toileting    Toileting assist Assist for toileting: Maximal Assistance - Patient 25 - 49%     Transfers Chair/bed transfer  Transfers assist  Chair/bed transfer activity did not occur: N/A  Chair/bed transfer assist level: Moderate Assistance - Patient 50 - 74%     Locomotion Ambulation   Ambulation assist   Ambulation activity did not occur: Safety/medical concerns  Assist level: Moderate Assistance - Patient 50 - 74% Assistive device: Other (comment)(rail) Max  distance: 30 ft   Walk 10 feet activity   Assist  Walk 10 feet activity did not occur: Safety/medical concerns  Assist level: Moderate Assistance - Patient - 50 - 74% Assistive device: Other (comment)(R rail)   Walk 50 feet activity   Assist Walk 50 feet with 2 turns activity did not occur: Safety/medical concerns         Walk 150 feet activity   Assist Walk 150 feet activity did not occur: Safety/medical concerns         Walk 10 feet on uneven surface  activity   Assist Walk 10 feet on uneven surfaces activity did not occur: Safety/medical concerns         Wheelchair     Assist Will patient use wheelchair at discharge?: (TBD) Type of Wheelchair: Manual Wheelchair activity did not occur: Safety/medical concerns  Wheelchair assist level: Supervision/Verbal cueing Max wheelchair distance: 50    Wheelchair 50 feet with 2 turns activity    Assist    Wheelchair 50 feet with 2 turns activity did not occur: Safety/medical concerns   Assist Level: Supervision/Verbal cueing   Wheelchair 150 feet activity     Assist Wheelchair 150 feet activity did not occur: Safety/medical concerns        Medical Problem List and Plan: 1. 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 Formulized individual treatment plan based on  medical history, underlying problem and comorbidities. 2. DVT Prophylaxis/Anticoagulation: Pharmaceutical:Lovenox instead of heparin, to reduce needle sticks and increase efficacy in post stroke population3. Pain Management:tylenol prn 4. Mood:Denies anxiety or depression. Monitor for now. LCSW to follow for evaluation and support. 5. Neuropsych: This patientis not fullycapable of  making decisions on hisown behalf. 6. Skin/Wound Care:routine pressure relief measures. 7. Fluids/Electrolytes/Nutrition:Monitor I/O. Check lytes in am. 8. UUE:[id number] BID. Continue metoprolol and catapres. Vitals:   01/20/18 1929 01/21/18 0614  BP: (!) 124/98 121/87  Pulse: (!) 117 91  Resp: 16 18  Temp: 98.5 F (36.9 C) 98.6 F (37 C)  SpO2: 100% 100%  Blood pressure controlled this am  9. Pancytopenia:Resolved Has resolved post Granix--d/c neutropenic precautions.Leukocytosis may be related to granix will recheck in a.m. 10. Prediabetes: Hgb A1c- 6.4. Monitor BS ac/hs and use SSI for elevated BS 11. Testicular cancer:Dr. Magrinatrecommends no further chemo and monitoring AFP/HCG every 6 months X 2 years.  12. Hypertriglyceridemia:On lipitor. 13. Depression: Was on Zyprexa for mood stabilization.  Now on trazodone  (also helping with excess drooling) Neuropsych is following, poor sleep increase trazodone 14.  LUE and LLE limb pain non focal , likely CVA related and a combination of increased tone and neurogenic pain, increase tizanidine to 63m qhs and start gabapentin 1061mqhs LOS: 6 days A FACE TO FACE EVALUATION WAS PERFORMED  AnCharlett Blake2/12/2017, 8:47 AM

## 2018-01-21 NOTE — Progress Notes (Signed)
Physical Therapy Session Note  Patient Details  Name: Carsyn Taubman MRN: [id number] Date of Birth: February 25, 1980  Today's Date: 01/21/2018 PT Individual Time: 1403-1430 PT Individual Time Calculation (min): 27 min   Short Term Goals: Week 1:  PT Short Term Goal 1 (Week 1): Pt will initiate gait training PT Short Term Goal 2 (Week 1): Pt will initiate w/c mobility PT Short Term Goal 3 (Week 1): Pt will maintain static standing balance with min A  Skilled Therapeutic Interventions/Progress Updates: Pt presented in bed agreeable to therapy. Performed supine to sit at EOB modA with PTA providing most assistance to LLE placement. Performed squat pivot to L modA. While PTA placing leg rests pt stating "I have poop in my butt". Performed stand pivot to toilet with use of wall rail. PTA doffed brief and pants total A (-BM). Pt returned to w/c in same manner as prior. Participated in w/c mobility around nursing station with pt intermittently required max encouragement to participate in activity.      Therapy Documentation Precautions:  Precautions Precautions: Fall Precaution Comments: corretrack Restrictions Weight Bearing Restrictions: No General:   Vital Signs: Therapy Vitals Pulse Rate: 84 Resp: 18 BP: 128/82 Patient Position (if appropriate): Lying Oxygen Therapy SpO2: 96 % O2 Device: Room Air Pain: Pain Assessment Pain Scale: Faces Faces Pain Scale: (no attempt made to indicate) Mobility:   Locomotion :    Trunk/Postural Assessment :    Balance:   Exercises:   Other Treatments:      Therapy/Group: Individual Therapy  Omesha Bowerman 01/21/2018, 4:15 PM

## 2018-01-21 NOTE — Consult Note (Signed)
Neuropsychological Consultation   Patient:   Caleb Hubbard   DOB:   [birth year]  MR Number:  [id number]  Location:  [facility] [id number] [place] [address] Dept: [place]: [phone]           Date of Service:   01/19/2018  Start Time:   9:30 AM End Time:   10:30 AM  Provider/Observer:  Ilean Skill, Psy.D.       Clinical Neuropsychologist       Billing Code/Service: [id number] 4 Units  Chief Complaint:    Caleb Hubbard is a 37 year old male with history of HTN, situational depression, testicular cancer with current treatment including chemo starting on 12/29/2017.  Patient admitted on 01/03/2018 with confusion, dystonic posturing and concerns of seizure activity.  USD positive for THC.  MRI showed numerous supra and infratentorial tentorial posterior circulation nonhemorrhagic infarcts with mild cytotoxic edema.  Stoke was felt to be embolic due to unclear service possible underlying basilar stenosis versus hypertension versus smoking/THC use.  Patient has been very depressed with ongoing vision and motor deficits.    01/21/2018:  The patient continues to have significant depressive symptoms and avoiding interacting.  He has had times where he reported fatigue and was either unwilling or refused to do parts of therapy.  Expressive aphasia continues but he is able to express self with limited verbalizations.  No word substitutions but articulation errors and fluency issues.  Reason for Service:  The patient was referred for neuropsychological consultation due to coping and adjustment issues.  Below is the HPI for the current admission.    Caleb Hubbard is a5 year old male with history of HTN, situational depression, testicular cancer S/B radical orchiectomy, recent retroperitoneal lymph node dissection with initiation of chemo 12/29/2017;who was admitted on 11/23 with confusion,  dystonic posturing and concerns of seizure activity. UDS positive for THC. CT head done showing patchy hypodensity in bilateral cerebellum and occipital lobe suggestive of acute infarcts versus PR ES. 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 Terrilee Files. Follow-up MRI of brain showed numerous supra and infratentorial tentorial posterior circulation nonhemorrhagic infarcts with mild cytotoxic edema. Blood cultures x2 done due to leukocytosis and were negative. BLE Dopplers negative for DVT. TEE showed no cardiac source of emboli, no ASD or PFO and moderate LVH with EF of 65 to 70%.  She developed pancytopenia with drop in white count to 0.5 and was treated with Granix daily with improvement in [place]. Blood pressures have been labile and medications titrated for better control. Heme-onc did not feel that hypercoagulable state was responsible for stroke.Dr. Leonie Man felt that stroke embolic due to unclear service possible underlying basilar stenosis versus hypertension versus smoking/THC use.He is to continue on low-dose ASA as well as Brilinta twice daily due to stent. MBS done for swallow eval and patient started on dysphagia to nectar's.He has had issues with tachycardia and CTA chest negative for PE.Dr. Jana Hakim felt that patient's cancer prognosis was good and does not recommend proceeding with further chemotherapy. Patient with resultant left hemiplegia, dysphagia, aphasia and cognitive deficitsaffecting overall functional status. CIR was recommended for further therapy  Current Status:  The patient continues to describe issues of depression and frustration around the lack of mobility and muscle function in  his leg and arm.  The patient continues to have significant  articulation errors and difficulties with expressive speech.  However, he does appear to fully understand what is being said to him and also was able to communicate adequately but uses few words and has significant articulation errors.  At least today the patient would initially try to talk using hand gestures but when pressed to use words he was able to express his ideas verbally but with significant articulation errors.  The patient's motivation continues to be an issue and anxiety, fear, anger and depression are likely issues that impact his participation in therapies.  Behavioral Observation: Caleb Hubbard  presents as a 37 y.o.-year-old Right African American Male who appeared his stated age. his dress was Appropriate and he was Well Groomed and his manners were Appropriate to the situation.  his participation was indicative of Appropriate, Inattentive and Redirectable behaviors.  There were any physical disabilities noted.  he displayed an appropriate level of cooperation and motivation.     Interactions:    Active Appropriate, Inattentive and Redirectable  Attention:   abnormal and attention span appeared shorter than expected for age  Memory:   within normal limits; recent and remote memory intact  Visuo-spatial:  abnormal  Visual deficits reported.  Speech (Volume):  low  Speech:   slurred  Thought Process:  Coherent and Tangential  Though Content:  WNL; not suicidal and not homicidal  Orientation:   person, place, time/date and situation  Judgment:   Fair  Planning:   Poor  Affect:    Anxious, Depressed and Lethargic  Mood:    Dysphoric  Insight:   Fair  Intelligence:   normal  Substance Use:  There is a documented history of marijuana abuse confirmed by the patient.  The patient denies any alcohol use.  Medical History:   Past Medical History:  Diagnosis Date  . Anxiety   . Cancer ([facility])   . Headache   . Hypertension   . Testicular mass             Psychiatric  History:  Patient has past history of anxiety but also reports symptoms of depression currently and in past.    Family Med/Psych History:  Family History  Problem Relation Age of Onset  . High blood pressure Father     Risk of Suicide/Violence: low Patient denies SI or HI.  Impression/DX:  Caleb Hubbard is a 37 year old male with history of HTN, situational depression, testicular cancer with current treatment including chemo starting on 12/29/2017.  Patient admitted on 01/03/2018 with confusion, dystonic posturing and concerns of seizure activity.  USD positive for THC.  MRI showed numerous supra and infratentorial tentorial posterior circulation nonhemorrhagic infarcts with mild cytotoxic edema.  Stoke was felt to be embolic due to unclear service possible underlying basilar stenosis versus hypertension versus smoking/THC use.  Patient has been very depressed with ongoing vision and motor deficits.   01/21/2018:  The patient continues to have significant depressive symptoms and avoiding interacting.  He has had times where he reported fatigue and was either unwilling or refused to do parts of therapy.  Expressive aphasia continues but he is able to express self with limited verbalizations.  No word substitutions but articulation errors and fluency issues.  The patient continues to describe issues of depression and frustration around the lack of mobility and muscle function in his leg and arm.  The patient continues to have significant articulation errors and difficulties with expressive  speech.  However, he does appear to fully understand what is being said to him and also was able to communicate adequately but uses few words and has significant articulation errors.  At least today the patient would initially try to talk using hand gestures but when pressed to use words he was able to express his ideas verbally but with significant articulation errors.  The patient's motivation continues to be an issue  and anxiety, fear, anger and depression are likely issues that impact his participation in therapies.    Diagnosis:    Basilar artery embolism - Plan: Ambulatory referral to Physical Medicine Rehab       Reactive depression  Electronically Signed   _______________________ Ilean Skill, Psy.D.

## 2018-01-21 NOTE — Progress Notes (Signed)
Occupational Therapy Session Note  Patient Details  Name: Caleb Hubbard MRN: [id number] Date of Birth: 1980/04/07  Today's Date: 01/21/2018 OT Individual Time: 0964-3838 OT Individual Time Calculation (min): 43 min    Short Term Goals: Week 1:  OT Short Term Goal 1 (Week 1): Pt will transfer to BSC/toilet consistantly iwht Min A and LRAD OT Short Term Goal 2 (Week 1): Pt will don shirt wiht min A OT Short Term Goal 3 (Week 1): Pt will manage LUE prior to transfers wiht min VC OT Short Term Goal 4 (Week 1): pt will recall hemi dressing techniques with min VC OT Short Term Goal 5 (Week 1): Pt will thread BLE into pants with min A  Skilled Therapeutic Interventions/Progress Updates:    Treatment session with focus on ADL retraining with bathing at shower level and dressing at sit > stand.  Pt received supine in bed agreeable to shower this session.  Completed bed mobility and squat pivot transfers bed > w/c > tub bench with mod assist.  Pt completed sit > stand throughout undressing and bathing with mod assist. Pt able to maintain standing balance with min assist to allow therapist to wash buttocks.  Pt not overly participatory during bathing as he would lightly reach to various body parts with wash cloth but would not bathe.  Therapist asked if pt could feel her touching his Lt arm during bathing, to which pt replied "no, it doesn't matter".  Donned shirt with max assist and cues for hemi-dressing technique.  Completed LB dressing initially donning incontinence brief at sit > stand level and then pants at bed level as pt unwilling to stand again to pull pants over hips.  Mod assist for standing balance when completing LB dressing and total assist for dressing tasks. Pt visibly in depressed mood and seemed distant throughout session.  Pt left supine in bed at end of session with all needs in reach.  Therapy Documentation Precautions:  Precautions Precautions: Fall Precaution Comments:  corretrack Restrictions Weight Bearing Restrictions: No General:   Vital Signs: Therapy Vitals Pulse Rate: 84 Resp: 18 BP: 128/82 Patient Position (if appropriate): Lying Oxygen Therapy SpO2: 96 % O2 Device: Room Air Pain: Pain Assessment Pain Scale: Faces Faces Pain Scale: (no attempt made to indicate)   Therapy/Group: Individual Therapy  Simonne Come 01/21/2018, 3:45 PM

## 2018-01-21 NOTE — Progress Notes (Signed)
Speech Language Pathology Daily Session Note  Patient Details  Name: Caleb Hubbard MRN: [id number] Date of Birth: 1981-01-02  Today's Date: 01/21/2018 SLP Individual Time: 0800-0900 SLP Individual Time Calculation (min): 60 min  Short Term Goals: Week 1: SLP Short Term Goal 1 (Week 1): Pt will utilize external memory aids to recall daily information with supervision cues.  SLP Short Term Goal 2 (Week 1): Pt will complex semi-complex problem solving tasks with Min A cues.  SLP Short Term Goal 3 (Week 1): Pt will demonstrate selective attention in mildly distracting environment for ~ 30 minutes with Min A cues.  SLP Short Term Goal 4 (Week 1): Pt will utilize speech intelligibility strategies to achieve ~ 90% speech intelligibility at the sentence level.  SLP Short Term Goal 5 (Week 1): Pt will consume current diet with minimal overt s/s of aspiration and supervision cues for use of compensatory strategies.   Skilled Therapeutic Interventions:  Skilled treatment session focused on cognition goals. SLP received pt in bed. Despite Max A cues and encouragement, pt with few attempts to interact with SLP or perform tasks. Pt was Max A for bed mobility to look at SLP. Breakfast tray present and despite continued Max A cues pt with no initiation to self-feed. Pt consumed minimal amounts of breakfast before laying back in bed with eyes closed. Pt was left in bed, bed alarm on and all needs within reach. Continue per current plan of care.   Pt made full nursing supervision to increase task initiation for self-feeding and to increase consumption.      Pain Pain Assessment Pain Scale: Faces Faces Pain Scale: (no attempt made to indicate)  Therapy/Group: Individual Therapy  Jurgen Groeneveld 01/21/2018, 12:19 PM

## 2018-01-21 NOTE — Plan of Care (Signed)
  Problem: Spiritual Needs Goal: Ability to function at adequate level Outcome: Progressing   Problem: Consults Goal: RH STROKE PATIENT EDUCATION Description See Patient Education module for education specifics  Outcome: Progressing Goal: Nutrition Consult-if indicated Outcome: Progressing   Problem: RH BOWEL ELIMINATION Goal: RH STG MANAGE BOWEL WITH ASSISTANCE Description STG Manage Bowel with min Assistance.  Outcome: Progressing Goal: RH STG MANAGE BOWEL W/MEDICATION W/ASSISTANCE Description STG Manage Bowel with Medication with min Assistance.  Outcome: Progressing   Problem: RH SKIN INTEGRITY Goal: RH STG SKIN FREE OF INFECTION/BREAKDOWN Description Patients skin will remain free from further infection or breakdown with mod assist.   Outcome: Progressing Goal: RH STG MAINTAIN SKIN INTEGRITY WITH ASSISTANCE Description STG Maintain Skin Integrity With mod Assistance.  Outcome: Progressing   Problem: RH SAFETY Goal: RH STG ADHERE TO SAFETY PRECAUTIONS W/ASSISTANCE/DEVICE Description STG Adhere to Safety Precautions With min Assistance/Device.  Outcome: Progressing   Problem: RH COGNITION-NURSING Goal: RH STG USES MEMORY AIDS/STRATEGIES W/ASSIST TO PROBLEM SOLVE Description STG Uses Memory Aids/Strategies With min Assistance to Problem Solve.  Outcome: Progressing   Problem: RH KNOWLEDGE DEFICIT Goal: RH STG INCREASE KNOWLEDGE OF HYPERTENSION Description Min assist  Outcome: Progressing Goal: RH STG INCREASE KNOWLEDGE OF DYSPHAGIA/FLUID INTAKE Description Min assist  Outcome: Progressing Goal: RH STG INCREASE KNOWLEGDE OF HYPERLIPIDEMIA Description Min assist  Outcome: Progressing Goal: RH STG INCREASE KNOWLEDGE OF STROKE PROPHYLAXIS Description Min assist  Outcome: Progressing

## 2018-01-22 ENCOUNTER — Inpatient Hospital Stay (HOSPITAL_COMMUNITY): Payer: Medicaid Other | Admitting: Occupational Therapy

## 2018-01-22 ENCOUNTER — Inpatient Hospital Stay (HOSPITAL_COMMUNITY): Payer: Medicaid Other | Admitting: Physical Therapy

## 2018-01-22 ENCOUNTER — Inpatient Hospital Stay (HOSPITAL_COMMUNITY): Payer: Medicaid Other | Admitting: Speech Pathology

## 2018-01-22 NOTE — Progress Notes (Signed)
Occupational Therapy Session Note  Patient Details  Name: Caleb Hubbard MRN: [id number] Date of Birth: 1980/03/04  Today's Date: 01/22/2018 OT Individual Time: 0915-1000 OT Individual Time Calculation (min): 45 min    Short Term Goals: Week 1:  OT Short Term Goal 1 (Week 1): Pt will transfer to BSC/toilet consistantly iwht Min A and LRAD OT Short Term Goal 2 (Week 1): Pt will don shirt wiht min A OT Short Term Goal 3 (Week 1): Pt will manage LUE prior to transfers wiht min VC OT Short Term Goal 4 (Week 1): pt will recall hemi dressing techniques with min VC OT Short Term Goal 5 (Week 1): Pt will thread BLE into pants with min A     Skilled Therapeutic Interventions/Progress Updates:    Pt seen for ADL training of shower and dressing.  Pt worked on rolling to R side and pushing up to sit with min A. Squat pivot to wc with min-mod A for forward lean and then stand pivot in shower with bar with min -mod A.  Initially pt was not talking or interacting and needed MAX CUES for thorough cleansing and to attend to his LUE as he was demonstrating poor initiation.  As pt was drying off and donning shirt with mod A, he said " I really like to talk" and all of a sudden became very engaged talking about what he likes, his son, etc.  Pt then demonstrated improved initiation with donning pants and socks, pulling them up once started over his toes.  He completed sit to stand 4x using R hand on bed rail with min A to rise and mod A for balance as he pulled pants over hips.  Pt resting in w/c, next OT arrived to continue session.    Therapy Documentation Precautions:  Precautions Precautions: Fall Precaution Comments: corretrack Restrictions Weight Bearing Restrictions: No    Pain: Pain Assessment Pain Score: 0-No pain  Therapy/Group: Individual Therapy  [place] 01/22/2018, 12:12 PM

## 2018-01-22 NOTE — Progress Notes (Signed)
[facility] PHYSICAL MEDICINE & REHABILITATION PROGRESS NOTE   Subjective/Complaints:   Patient tired and not very interactive this morning.  Has not had breakfast yet  ROS- no CP, SOB, N/V/D   Objective:   No results found. Recent Labs    01/21/18 0538  WBC 15.5*  HGB 8.9*  HCT 28.1*  PLT 859*   No results for input(s): NA, K, CL, CO2, GLUCOSE, BUN, CREATININE, CALCIUM in the last 72 hours.  Intake/Output Summary (Last 24 hours) at 01/22/2018 1525 Last data filed at 01/22/2018 0845 Gross per 24 hour  Intake 360 ml  Output -  Net 360 ml     Physical Exam: Vital Signs Blood pressure (!) 122/95, pulse (!) 106, temperature 98.4 F (36.9 C), temperature source Oral, resp. rate 17, height 5\' 11"  (1.803 m), weight 55 kg, SpO2 100 %.  General: No acute distress Mood and affect are appropriate Heart: Regular rate and rhythm no rubs murmurs or extra sounds Lungs: Clear to auscultation, breathing unlabored, no rales or wheezes Abdomen: Positive bowel sounds, soft nontender to palpation, nondistended Extremities: No clubbing, cyanosis, or edema Skin: No evidence of breakdown, no evidence of rash Neurologic: Cranial nerves II through XII intact, motor strength is 4/5 in right deltoid, bicep, tricep, grip, hip flexor, knee extensors, ankle dorsiflexor and plantar flexor, 0/5 on left except 3+ at trap  Tone MAS 2 Left hamstrings and Left elbow flexors Sensory exam normal sensation to light touch and proprioception in left  lower extremities, diminished LUE Cerebellar exam normal finger to nose to finger as well as heel to shin in bilateral upper and lower extremities Musculoskeletal: Full range of motion in all 4 extremities. No joint swelling    Assessment/Plan: 1. Functional deficits secondary to Left hemiparesis and left neglect Right pontine and occipital infarcts which require 3+ hours per day of interdisciplinary therapy in a comprehensive inpatient rehab  setting.  Physiatrist is providing close team supervision and 24 hour management of active medical problems listed below.  Physiatrist and rehab team continue to assess barriers to discharge/monitor patient progress toward functional and medical goals  Care Tool:  Bathing  Bathing activity did not occur: Refused Body parts bathed by patient: Chest, Right upper leg, Left upper leg, Face, Left arm, Abdomen, Front perineal area   Body parts bathed by helper: Buttocks, Right lower leg, Left lower leg, Right arm     Bathing assist Assist Level: Moderate Assistance - Patient 50 - 74%     Upper Body Dressing/Undressing Upper body dressing   What is the patient wearing?: Pull over shirt    Upper body assist Assist Level: Moderate Assistance - Patient 50 - 74%    Lower Body Dressing/Undressing Lower body dressing      What is the patient wearing?: Pants, Incontinence brief     Lower body assist Assist for lower body dressing: Maximal Assistance - Patient 25 - 49%     Toileting Toileting    Toileting assist Assist for toileting: Maximal Assistance - Patient 25 - 49%     Transfers Chair/bed transfer  Transfers assist  Chair/bed transfer activity did not occur: N/A  Chair/bed transfer assist level: Moderate Assistance - Patient 50 - 74%     Locomotion Ambulation   Ambulation assist   Ambulation activity did not occur: Safety/medical concerns  Assist level: Maximal Assistance - Patient 25 - 49% Assistive device: (rail) Max distance: 25 ft   Walk 10 feet activity   Assist  Walk 10 feet  activity did not occur: Safety/medical concerns  Assist level: Maximal Assistance - Patient 25 - 49% Assistive device: (rail)   Walk 50 feet activity   Assist Walk 50 feet with 2 turns activity did not occur: Safety/medical concerns         Walk 150 feet activity   Assist Walk 150 feet activity did not occur: Safety/medical concerns         Walk 10 feet on  uneven surface  activity   Assist Walk 10 feet on uneven surfaces activity did not occur: Safety/medical concerns         Wheelchair     Assist Will patient use wheelchair at discharge?: Yes Type of Wheelchair: Manual Wheelchair activity did not occur: Safety/medical concerns  Wheelchair assist level: Supervision/Verbal cueing Max wheelchair distance: 100 ft     Wheelchair 50 feet with 2 turns activity    Assist    Wheelchair 50 feet with 2 turns activity did not occur: Safety/medical concerns   Assist Level: Supervision/Verbal cueing   Wheelchair 150 feet activity     Assist Wheelchair 150 feet activity did not occur: Safety/medical concerns        Medical Problem List and Plan: 1. 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 DVT Prophylaxis/Anticoagulation: Pharmaceutical:Lovenox instead of heparin, to reduce needle sticks and increase efficacy in post stroke population3. Pain Management:tylenol prn 4. Mood:Denies anxiety or depression. Monitor for now. LCSW to follow for evaluation and support. 5. Neuropsych: This patientis not fullycapable of making decisions on hisown behalf. 6. Skin/Wound Care:routine pressure relief measures. 7. Fluids/Electrolytes/Nutrition:Monitor I/O. Check lytes in am. 8. QMG:NOIBBCW BID. Continue metoprolol and catapres. Vitals:   01/21/18 1949 01/22/18 0615  BP: (!) 120/91 (!) 122/95  Pulse: (!) 111 (!) 106  Resp: 18 17  Temp: 98.1 F (36.7 C) 98.4 F (36.9 C)  SpO2: 100% 100%  Blood pressure controlled this am  9. Pancytopenia:Resolved Has resolved post Granix--d/c neutropenic precautions.Leukocytosis may be related to granix, also with thrombocytosis and anemia, recheck in a.m. may need to contact oncology/hematology  10. Prediabetes: Hgb A1c- 6.4. Monitor BS ac/hs and use SSI for elevated  BS 11. Testicular cancer:Dr. Magrinatrecommends no further chemo and monitoring AFP/HCG every 6 months X 2 years.  12. Hypertriglyceridemia:On lipitor. 13. Depression: Was on Zyprexa for mood stabilization.  Now on trazodone  (also helping with excess drooling) Neuropsych is following, poor sleep increase trazodone 14.  LUE and LLE limb pain non focal , likely CVA related and a combination of increased tone and neurogenic pain, increase tizanidine to 4mg  qhs and start gabapentin 100mg  qhs LOS: 7 days A FACE TO FACE EVALUATION WAS PERFORMED  [name] 01/22/2018, 3:25 PM

## 2018-01-22 NOTE — Progress Notes (Signed)
Physical Therapy Session Note  Patient Details  Name: Caleb Hubbard MRN: [id number] Date of Birth: August 31, 1980  Today's Date: 01/22/2018 PT Individual Time: 1157-2620 PT Individual Time Calculation (min): 40 min   Short Term Goals: Week 1:  PT Short Term Goal 1 (Week 1): Pt will initiate gait training PT Short Term Goal 2 (Week 1): Pt will initiate w/c mobility PT Short Term Goal 3 (Week 1): Pt will maintain static standing balance with min A  Skilled Therapeutic Interventions/Progress Updates:   Pt in supine and agreeable to therapy, no c/o pain. Transferred to EOB and to w/c via stand pivot w/ min assist. Pt refused to self-propel w/c to therapy gym to work on independence w/ locomotion despite max encouragement. Total assist to/from. Worked on LLE NMR in stance w/ high/low table as support. Performed lateral weight shifting and mini-squats w/ min assist and tactile cues for L quad activation during weight acceptance. Supine exercises performed w/ emphasis on L hip activation including supine bridges 3x10, assisted L clamshells 1x10, and gravity eliminated hip flexion and extension 2x10. Pt w/ increasing frustration during session, agreeable to participate in checkers game from seated level before going back to room. Engaged in activity for 10+ minutes w/ verbal cues for weight shifting balance strategies in seated when reaching to edge of BOS. Pt refused further activity when he heard the food cart going down the hallway. Returned to room and ended session in supine, all needs in reach. Missed 20 min of skilled PT 2/2 refusal.   Therapy Documentation Precautions:  Precautions Precautions: Fall Precaution Comments: corretrack Restrictions Weight Bearing Restrictions: No General: PT Amount of Missed Time (min): 20 Minutes PT Missed Treatment Reason: Patient unwilling to participate Vital Signs: Therapy Vitals Temp: 98.3 F (36.8 C) Pulse Rate: 98 Resp: 18 BP: (!) 134/100 Patient  Position (if appropriate): Lying Oxygen Therapy SpO2: 100 % O2 Device: Room Air  Therapy/Group: Individual Therapy  Lillyn Wieczorek Clent Demark 01/22/2018, 4:54 PM

## 2018-01-22 NOTE — Progress Notes (Signed)
Occupational Therapy Session Note  Patient Details  Name: Caleb Hubbard MRN: [id number] Date of Birth: September 11, 1980  Today's Date: 01/22/2018 OT Individual Time: 1003-1058 OT Individual Time Calculation (min): 55 min    Short Term Goals: Week 1:  OT Short Term Goal 1 (Week 1): Pt will transfer to BSC/toilet consistantly iwht Min A and LRAD OT Short Term Goal 2 (Week 1): Pt will don shirt wiht min A OT Short Term Goal 3 (Week 1): Pt will manage LUE prior to transfers wiht min VC OT Short Term Goal 4 (Week 1): pt will recall hemi dressing techniques with min VC OT Short Term Goal 5 (Week 1): Pt will thread BLE into pants with min A  Skilled Therapeutic Interventions/Progress Updates:    Treatment session with focus on transfers, dynamic sitting balance, and LUE NMR.  Pt received upright in w/c from previous OT.  Pt already completed bathing and dressing, but willing to complete oral care with encouragement.  Therapist set up items and pt able to brush teeth with increased time.  Pt demonstrating difficulty with spitting, therefore utilized wash cloth to completely clean out mouth.  Engaged in stand pivot transfers min-mod assist with pt demonstrating increased anterior weight shift and lift off this session.  Pt overall more engaged this session and apologetic for decreased participation recently.  Engaged in shoulder shrugs in sitting at edge of mat with focus on activation of scapular movement.  Engaged in WB through Lt elbow and shoulder in sidelying with focus on reaching with RUE while in WB position to address scapular stability.  Therapist completed shoulder mobilizations in sitting with focus on flexion/extension, abduction/adduction, and internal/external rotation.  Utilized stool in tilted position to attempt to facilitate shoulder flexion with gravity eliminated while focusing on attention to LUE during mobility.  Pt with no active movement this session, but able to attend to tasks and  demonstrating increased interaction with therapist.  Assessed skin sensation, with sensation to light touch intact in Lt arm and hand.  Pt requested to return to bed at end of session as no more therapy till afternoon.  Completed stand pivot transfers min-mod assist and left in sidelying in bed.  Of note, also utilized pt personal music for motivation throughout session.  Therapy Documentation Precautions:  Precautions Precautions: Fall Precaution Comments: corretrack Restrictions Weight Bearing Restrictions: No Pain:  Pt with no c/o pain   Therapy/Group: Individual Therapy  Simonne Come 01/22/2018, 11:15 AM

## 2018-01-22 NOTE — Progress Notes (Signed)
Speech Language Pathology Daily Session Note  Patient Details  Name: Caleb Hubbard MRN: [id number] Date of Birth: [birth year]  Today's Date: 01/22/2018 SLP Individual Time: 1400-1430 SLP Individual Time Calculation (min): 30 min  Short Term Goals: Week 1: SLP Short Term Goal 1 (Week 1): Pt will utilize external memory aids to recall daily information with supervision cues.  SLP Short Term Goal 2 (Week 1): Pt will complex semi-complex problem solving tasks with Min A cues.  SLP Short Term Goal 3 (Week 1): Pt will demonstrate selective attention in mildly distracting environment for ~ 30 minutes with Min A cues.  SLP Short Term Goal 4 (Week 1): Pt will utilize speech intelligibility strategies to achieve ~ 90% speech intelligibility at the sentence level.  SLP Short Term Goal 5 (Week 1): Pt will consume current diet with minimal overt s/s of aspiration and supervision cues for use of compensatory strategies.   Skilled Therapeutic Interventions: Skilled treatment session focused on family education with the patient's mother and cognitive functioning. Patient's mother present and asking questions in regards to the patient's current swallowing function and diet recommendation. SLP provided education in regards to patient's increased aspiration risk with solids due to oral dysphagia as well as the impact that his cognitive impairments has on his swallowing function. Handout provided in regards to appropriate textures. She verbalized understanding. Patient was also administered the [facility] and scored 12/22 points with a score of 18 or above considered normal. Patient demonstrates deficits in attention, recall, and abstract reasoning. Patient left supine in bed with alarm on and all needs within reach. Continue with current plan of care.      Pain Pain Assessment Pain Score: 0-No pain  Therapy/Group: Individual Therapy  Shamina Etheridge, [place] 01/22/2018, 3:13 PM

## 2018-01-23 ENCOUNTER — Inpatient Hospital Stay (HOSPITAL_COMMUNITY): Payer: Medicaid Other | Admitting: Occupational Therapy

## 2018-01-23 ENCOUNTER — Inpatient Hospital Stay (HOSPITAL_COMMUNITY): Payer: Medicaid Other

## 2018-01-23 ENCOUNTER — Inpatient Hospital Stay (HOSPITAL_COMMUNITY): Payer: Medicaid Other | Admitting: Speech Pathology

## 2018-01-23 ENCOUNTER — Inpatient Hospital Stay (HOSPITAL_COMMUNITY): Payer: Medicaid Other | Admitting: Physical Therapy

## 2018-01-23 LAB — CBC
HCT: 27.8 % — ABNORMAL LOW (ref 39.0–52.0)
Hemoglobin: 8.9 g/dL — ABNORMAL LOW (ref 13.0–17.0)
MCH: 31.2 pg (ref 26.0–34.0)
MCHC: 32 g/dL (ref 30.0–36.0)
MCV: 97.5 fL (ref 80.0–100.0)
PLATELETS: 790 10*3/uL — AB (ref 150–400)
RBC: 2.85 MIL/uL — ABNORMAL LOW (ref 4.22–5.81)
RDW: 15.8 % — ABNORMAL HIGH (ref 11.5–15.5)
WBC: 13 10*3/uL — ABNORMAL HIGH (ref 4.0–10.5)
nRBC: 0 % (ref 0.0–0.2)

## 2018-01-23 MED ORDER — TRAZODONE HCL 50 MG PO TABS
100.0000 mg | ORAL_TABLET | Freq: Every day | ORAL | Status: DC
  Administered 2018-01-23 – 2018-02-08 (×17): 100 mg via ORAL
  Filled 2018-01-23 (×17): qty 2

## 2018-01-23 NOTE — Progress Notes (Signed)
Speech Language Pathology Daily Session Note  Patient Details  Name: Caleb Hubbard MRN: [id number] Date of Birth: [birth year]  Today's Date: 01/23/2018 SLP Individual Time: 1335-1420 SLP Individual Time Calculation (min): 45 min  Short Term Goals: Week 1: SLP Short Term Goal 1 (Week 1): Pt will utilize external memory aids to recall daily information with supervision cues.  SLP Short Term Goal 2 (Week 1): Pt will complex semi-complex problem solving tasks with Min A cues.  SLP Short Term Goal 3 (Week 1): Pt will demonstrate selective attention in mildly distracting environment for ~ 30 minutes with Min A cues.  SLP Short Term Goal 4 (Week 1): Pt will utilize speech intelligibility strategies to achieve ~ 90% speech intelligibility at the sentence level.  SLP Short Term Goal 5 (Week 1): Pt will consume current diet with minimal overt s/s of aspiration and supervision cues for use of compensatory strategies.   Skilled Therapeutic Interventions:  Skilled ST interventions included selective attention, recall, problem solving, and dysphagia management. Outcomes: Pt observed lying in bed and required mod verbal encouragement to participate in today's therapy session. Pt required mod verbal/visual cues in order to assist with transfer from bed to w/c. Pt required set-up assistance for oral care and independently completed oral care. When attempting to engage patient with structured task, pt stated "I wet my pants". When asked if patient knew he needed to use the restroom before brushing his teeth, patient stated "Well I have a diaper on". Education provided re: importance of utilizing toilet here to decrease risk of certain infections and practice toilet transfers when given the opportunity for enhance functional independence. Pt verbalized understanding. Following assistance from staff member to provide new brief, patient did not want to consume snack to demonstrate use of safe swallowing strategies.  Education provided re: importance of oral care to reduce risk of aspiration PNA. Pt able to recall/verbalize purpose of oral care following 10 min delay. Pt left in bed with call bell within reach. Continue POC.   Pain Pain Assessment Pain Scale: 0-10 Pain Score: 0-No pain Pain Type: Acute pain Pain Location: Generalized Pain Orientation: Right Pain Radiating Towards: right side of body Pain Descriptors / Indicators: Aching Pain Frequency: Intermittent Pain Onset: On-going Pain Intervention(s): Medication (See eMAR)  Therapy/Group: Individual Therapy  Loni Beckwith 01/23/2018, 4:13 PM

## 2018-01-23 NOTE — Progress Notes (Signed)
Physical Therapy Weekly Progress Note  Patient Details  Name: Caleb Hubbard MRN: [id number] Date of Birth: 1980-03-28  Beginning of progress report period: January 16, 2018 End of progress report period: January 23, 2018  Today's Date: 01/23/2018   Patient has met 2 of 3 short term goals.  Pt is making slow progress towards all LTG's due to poor engagement, motivation, and willingness to participate in sessions. Pt has ambulated with the rail with max assist with no active movement noted in LLE/LUE. Pt can propel w/c with supervision with R hemi technique. Pt would benefit from continued skilled PT treatment to focus on transfers, sitting & standing balance, w/c mobility, bed mobility, gait, pt education, and d/c planning.   Patient continues to demonstrate the following deficits muscle weakness, decreased cardiorespiratoy endurance, decreased coordination, decreased visual acuity, decreased attention to left, decreased initiation, decreased attention, decreased awareness, decreased problem solving, decreased safety awareness, decreased memory and delayed processing, and decreased sitting balance, decreased standing balance, decreased postural control, hemiplegia and decreased balance strategies and therefore will continue to benefit from skilled PT intervention to increase functional independence with mobility.  Patient is slowly progressing toward long term goals..  Continue plan of care.  PT Short Term Goals Week 1:  PT Short Term Goal 1 (Week 1): Pt will initiate gait training PT Short Term Goal 1 - Progress (Week 1): Met PT Short Term Goal 2 (Week 1): Pt will initiate w/c mobility PT Short Term Goal 2 - Progress (Week 1): Met PT Short Term Goal 3 (Week 1): Pt will maintain static standing balance with min A PT Short Term Goal 3 - Progress (Week 1): Not progressing Week 2:  PT Short Term Goal 1 (Week 2): Pt will ambulate 30 ft with LRAD & mod assist.  PT Short Term Goal 2 (Week 2): Pt  will complete bed<>w/c transfers with min assist.  PT Short Term Goal 3 (Week 2): Pt will complete bed mobility with CGA.   Therapy Documentation Precautions:  Precautions Precautions: Fall Precaution Comments: corretrack Restrictions Weight Bearing Restrictions: No   Therapy/Group: Individual Therapy  [name] 01/23/2018, 7:56 AM

## 2018-01-23 NOTE — Progress Notes (Signed)
Social Work Patient ID: Caleb Hubbard, male   DOB: 19-Oct-1980, 36 y.o.   MRN: [id number] Mom was here yesterday to observe in therapies only could see Speech needed to leave before PT session. She has many questions regarding his diet which Courtney-Sp answered and how can he be ambulatory with a flaccid left side. Since she will be one of his caregiver's she will need to be trained prior to discharge. She will be back Sat have asked for PT session to be either at 10 or 11 since she will be here then and can observe his session. Had her apply for medicaid while she was here yesterday. She will bring her FMLA forms to be completed by PA also on Sat. Work toward discharge 12/28

## 2018-01-23 NOTE — Progress Notes (Signed)
Poor night sleep. Up 4 times to try and have BM. PRN supp. And senna S given per patient's request. Nothing felt in rectum when supp. Given. External hemorrhoid observed. Refused PRAFO and arm splint.Caleb Hubbard

## 2018-01-23 NOTE — Progress Notes (Signed)
[facility] PHYSICAL MEDICINE & REHABILITATION PROGRESS NOTE   Subjective/Complaints:  Just waking up , OT in room  ROS- no CP, SOB, N/V/D   Objective:   No results found. Recent Labs    01/21/18 0538 01/23/18 0718  WBC 15.5* 13.0*  HGB 8.9* 8.9*  HCT 28.1* 27.8*  PLT 859* 790*   No results for input(s): NA, K, CL, CO2, GLUCOSE, BUN, CREATININE, CALCIUM in the last 72 hours.  Intake/Output Summary (Last 24 hours) at 01/23/2018 0830 Last data filed at 01/22/2018 2300 Gross per 24 hour  Intake 720 ml  Output -  Net 720 ml     Physical Exam: Vital Signs Blood pressure (!) 137/105, pulse 99, temperature 98.3 F (36.8 C), temperature source Oral, resp. rate 18, height 5\' 11"  (1.803 m), weight 55 kg, SpO2 99 %.  General: No acute distress Mood and affect are appropriate Heart: Regular rate and rhythm no rubs murmurs or extra sounds Lungs: Clear to auscultation, breathing unlabored, no rales or wheezes Abdomen: Positive bowel sounds, soft nontender to palpation, nondistended Extremities: No clubbing, cyanosis, or edema Skin: No evidence of breakdown, no evidence of rash Neurologic: Cranial nerves II through XII intact, motor strength is 4/5 in right deltoid, bicep, tricep, grip, hip flexor, knee extensors, ankle dorsiflexor and plantar flexor, 0/5 on left except 3+ at trap  Tone MAS 2 Left hamstrings and Left elbow flexors Sensory exam normal sensation to light touch and proprioception in left  lower extremities, diminished LUE Cerebellar exam normal finger to nose to finger as well as heel to shin in bilateral upper and lower extremities Musculoskeletal: Full range of motion in all 4 extremities. No joint swelling    Assessment/Plan: 1. 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  Physiatrist is providing  close team supervision and 24 hour management of active medical problems listed below.  Physiatrist and rehab team continue to assess barriers to discharge/monitor patient progress toward functional and medical goals  Care Tool:  Bathing  Bathing activity did not occur: Refused Body parts bathed by patient: Chest, Right upper leg, Left upper leg, Face, Left arm, Abdomen, Front perineal area   Body parts bathed by helper: Buttocks, Right lower leg, Left lower leg, Right arm     Bathing assist Assist Level: Moderate Assistance - Patient 50 - 74%     Upper Body Dressing/Undressing Upper body dressing   What is the patient wearing?: Pull over shirt    Upper body assist Assist Level: Moderate Assistance - Patient 50 - 74%    Lower Body Dressing/Undressing Lower body dressing      What is the patient wearing?: Pants, Incontinence brief     Lower body assist Assist for lower body dressing: Maximal Assistance - Patient 25 - 49%     Toileting Toileting    Toileting assist Assist for toileting: Maximal Assistance - Patient 25 - 49%     Transfers Chair/bed transfer  Transfers assist  Chair/bed transfer activity did not occur: N/A  Chair/bed transfer assist level: Moderate Assistance - Patient 50 - 74%     Locomotion Ambulation   Ambulation assist   Ambulation activity did not occur: Safety/medical concerns  Assist level: Maximal Assistance - Patient 25 - 49% Assistive device: (rail) Max distance: 25 ft   Walk 10 feet activity   Assist  Walk 10 feet activity did not  occur: Safety/medical concerns  Assist level: Maximal Assistance - Patient 25 - 49% Assistive device: (rail)   Walk 50 feet activity   Assist Walk 50 feet with 2 turns activity did not occur: Safety/medical concerns         Walk 150 feet activity   Assist Walk 150 feet activity did not occur: Safety/medical concerns         Walk 10 feet on uneven surface  activity   Assist Walk  10 feet on uneven surfaces activity did not occur: Safety/medical concerns         Wheelchair     Assist Will patient use wheelchair at discharge?: Yes Type of Wheelchair: Manual Wheelchair activity did not occur: Safety/medical concerns  Wheelchair assist level: Supervision/Verbal cueing Max wheelchair distance: 100 ft     Wheelchair 50 feet with 2 turns activity    Assist    Wheelchair 50 feet with 2 turns activity did not occur: Safety/medical concerns   Assist Level: Supervision/Verbal cueing   Wheelchair 150 feet activity     Assist Wheelchair 150 feet activity did not occur: Safety/medical concerns        Medical Problem List and Plan: 1. 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 DVT Prophylaxis/Anticoagulation: Pharmaceutical:Lovenox instead of heparin, to reduce needle sticks and increase efficacy in post stroke population3. Pain Management:tylenol prn 4. Mood:Denies anxiety or depression. Monitor for now. LCSW to follow for evaluation and support. 5. Neuropsych: This patientis not fullycapable of making decisions on hisown behalf. 6. Skin/Wound Care:routine pressure relief measures. 7. Fluids/Electrolytes/Nutrition:Monitor I/O. Check lytes in am. 8. SVX:BLTJQZE BID. Continue metoprolol and catapres. Vitals:   01/22/18 2027 01/23/18 0436  BP: 127/89 (!) 137/105  Pulse: (!) 118 99  Resp: 18 18  Temp: 98.4 F (36.9 C) 98.3 F (36.8 C)  SpO2: 99% 99%  Blood pressure fluctuating   9. Pancytopenia:Resolved Has resolved post Granix--d/c neutropenic precautions.Leukocytosis may be related to granix, also with thrombocytosis and anemia,appears to be trending down  Cont to monitor. may need to contact oncology/hematology  10. Prediabetes: Hgb A1c- 6.4. Monitor BS ac/hs and use SSI for elevated BS 11. Testicular cancer:Dr.  Magrinatrecommends no further chemo and monitoring AFP/HCG every 6 months X 2 years.  12. Hypertriglyceridemia:On lipitor. 13. Depression: Was on Zyprexa for mood stabilization.  Now on trazodone  (also helping with excess drooling) Neuropsych is following, poor sleep increase trazodone 14.  LUE and LLE limb pain non focal , likely CVA related and a combination of increased tone and neurogenic pain, increase tizanidine to 4mg  qhs and start gabapentin 100mg  qhs- monitor for sedation LOS: 8 days A FACE TO FACE EVALUATION WAS PERFORMED  [name] 01/23/2018, 8:30 AM

## 2018-01-23 NOTE — Progress Notes (Signed)
Physical Therapy Note  Patient Details  Name: Caleb Hubbard MRN: [id number] Date of Birth: March 28, 1980 Today's Date: 01/23/2018    Pt's plan of care adjusted to 15/7 as pt currently unable to tolerate current therapy schedule with OT, PT, and SLP.     [name] 01/23/2018, 12:15 PM

## 2018-01-23 NOTE — Progress Notes (Signed)
Occupational Therapy Weekly Progress Note  Patient Details  Name: Caleb Hubbard MRN: [id number] Date of Birth: 1980-08-06  Beginning of progress report period: January 16, 2018 End of progress report period: January 23, 2018  Today's Date: 01/23/2018 OT Individual Time: 4920-1007 OT Individual Time Calculation (min): 39 min  and Today's Date: 01/23/2018 OT Missed Time: 21 Minutes Missed Time Reason: Patient unwilling/refused to participate without medical reason;Patient fatigue   Patient has met 1 of 5 short term goals.  Pt is making minimal progress towards goals due to decreased participation in therapy sessions.  Pt has demonstrated ability to complete stand pivot transfers at min assist but often requires mod-max assist due to decreased participation and motivation.  Pt requires max multimodal cues and still requires max assist for self-care tasks due to decreased participation and motivation.    Patient continues to demonstrate the following deficits: basic self-care skills and increase independence with basic self-care skills prior to discharge home with care partner.  Anticipate patient will require 24 hour supervision and minimal physical assistance and follow up home health and follow up outpatient and therefore will continue to benefit from skilled OT intervention to enhance overall performance with BADL and Reduce care partner burden.  Patient progressing toward long term goals..  Continue plan of care.  OT Short Term Goals Week 1:  OT Short Term Goal 1 (Week 1): Pt will transfer to BSC/toilet consistantly iwht Min A and LRAD OT Short Term Goal 1 - Progress (Week 1): Met OT Short Term Goal 2 (Week 1): Pt will don shirt wiht min A OT Short Term Goal 2 - Progress (Week 1): Progressing toward goal OT Short Term Goal 3 (Week 1): Pt will manage LUE prior to transfers wiht min VC OT Short Term Goal 3 - Progress (Week 1): Progressing toward goal OT Short Term Goal 4 (Week 1): pt will  recall hemi dressing techniques with min VC OT Short Term Goal 4 - Progress (Week 1): Progressing toward goal OT Short Term Goal 5 (Week 1): Pt will thread BLE into pants with min A OT Short Term Goal 5 - Progress (Week 1): Progressing toward goal Week 2:  OT Short Term Goal 1 (Week 2): Pt will don shirt with min A OT Short Term Goal 2 (Week 2): Pt will manage LUE prior to transfers with min VC OT Short Term Goal 3 (Week 2): Pt will recall hemi dressing techniques with min VC OT Short Term Goal 4 (Week 2): Pt will thread BLE into pants with min A  Skilled Therapeutic Interventions/Progress Updates:    Treatment session with focus on bed mobility, functional transfers, dynamic sitting and standing balance.  Pt received supine in bed asleep but easily aroused.  Pt reports not having breakfast yet.  Engaged in bed mobility with min assist to sit to EOB.  Engaged in self-feeding while seated EOB, pt required assistance to open packets and would verbalize 50% of time and gesture other times to make desires known.  Pt reports need to toilet.  Completed stand pivot transfer bed > w/c mod assist and then w/c <> toilet min assist.  Pt incontinent of urine prior to transfer to toilet, however continent of bowel.  Therapist completed hygiene for thoroughness while pt able to maintain standing with min assist.  Pt pulled pants over hips with min assist and mod cues to attempt to complete task.  Pt returned to bed and requested to terminate session due to fatigue, as pt and RN  had reported pt not sleeping well over night.  Pt missed 21 mins due to fatigue.  Therapy Documentation Precautions:  Precautions Precautions: Fall Precaution Comments: corretrack Restrictions Weight Bearing Restrictions: No General: General OT Amount of Missed Time: 21 Minutes Pain:  Pt with c/o pain in LLE.  RN notified.   Therapy/Group: Individual Therapy  Simonne Come 01/23/2018, 8:47 AM

## 2018-01-23 NOTE — Progress Notes (Addendum)
Physical Therapy Session Note  Patient Details  Name: Caleb Hubbard MRN: [id number] Date of Birth: 1981-01-19  Today's Date: 01/23/2018 PT Individual Time: 1102-1156 PT Individual Time Calculation (min): 54 min   Skilled Therapeutic Interventions/Progress Updates:  Pt received in bed initially declining participation as he wished to watch a TV show but was reluctantly agreeable with encouragement. Pt transfers to sitting EOB with min assist to fully turn to and place LLE on EOB with max cuing to utilize RUE to assist LLE. Therapist dons tennis shoes total assist for time management. Pt can sit EOB with RUE support with supervision. Pt attempts stand pivot with mod assist and propels w/c room>therapy offices with supervision and education to attend to L side of environment for obstacle avoidance and to use R UE/LE simultaneously. Transported pt to ortho gym where pt requires significantly extra time to attempt scooting forward on edge of w/c seat then transfers w/c>mat table with min assist squat pivot. Pt does not attempt to hold self at [facility] for very long and instead lies down abruptly. Pt performed BLE bridging with cuing for hold at top and max hip extension/glute activation (pt unable to achieve half range) with therapist manually assisting LLE with positioning and alignment. Therapist observed L quad activation and instructed pt in performing LLE heel slides with max cuing. Therapist with great encouragement and excitement over LLE activation & pt stating "whatever". Pt transferred to EOB with supervision and requires max cuing to attempt to scoot to EOM but ultimately doesn't. Pt transfers EOM>w/c with mod assist with pt electing to perform stand pivot vs squat pivot. Pt propels w/c back towards room with max cuing for engagement. Pt reports incontinence and returned to room & transferred to standing at sink with mod assist with pt able to weight bear with LLE without any buckling noted. Therapist  provides max<>total assist for donning clean brief, although no incontinence noted, but pt able to pull pants up/down over hips with assistance. At end of session pt left sitting in w/c in room with chair alarm donned, call bell in reach.   Therapist attempts to educate pt on stroke recovery & provide encouragement throughout, with pt demonstrating overall depressed mood throughout session. Pt reports it doesn't matter that his LLE is working, "I've seen the movies". After pt reports incontinence he reports he knows when he needs to use the bathroom & therapist encouraged pt to report need to use restroom in order to get to toilet with pt shaking head no.  PA ([name]) made aware of pt's behaviors & ongoing limited engagement throughout session.  Therapy Documentation Precautions:  Precautions Precautions: Fall Precaution Comments: corretrack Restrictions Weight Bearing Restrictions: No    Therapy/Group: Individual Therapy  [name] 01/23/2018, 12:14 PM

## 2018-01-23 NOTE — Progress Notes (Signed)
Speech Language Pathology Weekly Progress and Session Note  Patient Details  Name: Caleb Hubbard MRN: [id number] Date of Birth: 10/31/1980  Beginning of progress report period: January 16, 2018 End of progress report period: January 23, 2018  Today's Date: 01/23/2018 SLP Individual Time: 1335-1420 SLP Individual Time Calculation (min): 45 min  Short Term Goals: Week 1: SLP Short Term Goal 1 (Week 1): Pt will utilize external memory aids to recall daily information with supervision cues.  SLP Short Term Goal 1 - Progress (Week 1): Not met SLP Short Term Goal 2 (Week 1): Pt will complex semi-complex problem solving tasks with Min A cues.  SLP Short Term Goal 2 - Progress (Week 1): Not met SLP Short Term Goal 3 (Week 1): Pt will demonstrate selective attention in mildly distracting environment for ~ 30 minutes with Min A cues.  SLP Short Term Goal 3 - Progress (Week 1): Not met SLP Short Term Goal 4 (Week 1): Pt will utilize speech intelligibility strategies to achieve ~ 90% speech intelligibility at the sentence level.  SLP Short Term Goal 4 - Progress (Week 1): Not met SLP Short Term Goal 5 (Week 1): Pt will consume current diet with minimal overt s/s of aspiration and supervision cues for use of compensatory strategies.  SLP Short Term Goal 5 - Progress (Week 1): Progressing toward goal    New Short Term Goals: Week 2: SLP Short Term Goal 1 (Week 2): Pt will utilize external memory aids to recall daily information with supervision cues.  SLP Short Term Goal 2 (Week 2): Pt will complex semi-complex problem solving tasks with Min A cues.  SLP Short Term Goal 3 (Week 2): Pt will demonstrate selective attention in mildly distracting environment for ~ 30 minutes with Min A cues.  SLP Short Term Goal 4 (Week 2): Pt will utilize speech intelligibility strategies to achieve ~ 90% speech intelligibility at the sentence level.  SLP Short Term Goal 5 (Week 2): Pt will consume current diet with  minimal overt s/s of aspiration and supervision cues for use of compensatory strategies.   Weekly Progress Updates:     Intensity: Minumum of 1-2 x/day, 30 to 90 minutes Frequency: 3 to 5 out of 7 days Duration/Length of Stay: 3 to 4 weeks Treatment/Interventions: Cognitive remediation/compensation;Dysphagia/aspiration precaution training;Internal/external aids;Speech/Language facilitation;Medication managment;Therapeutic Activities;Functional tasks;Patient/family education   Daily Session  Skilled Therapeutic Interventions: Patient continues to demonstrate mild to moderate deficits in cognitive-communication skills, including attention, problem solving, and recall. Barriers to progress include patient's decreased participation/motivation during skilled ST sessions as well has behavior-related factors. Patient requires encouragement to participate in structured tasks, complete self-care in room (I.e. dressing, feeding, and oral care), and complete transfers safely. Patient benefits from verbal cues throughout session to complete tasks and ADLs in room with increased accuracy, however patient continues to be at risk for falls. His speech intelligibility continues to be <90% at sentence level with no independent use of speech intelligibility strategies during ST sessions. Patient demonstrates decreased awareness of safe swallowing strategies, requiring continued supervision during all meals. Skilled ST services are therapeutically necessary in order to enhanced patient's functional independence for home environment, decrease patient's risk of falls in current environment, and decrease risk of asp/asp PNA on current diet, and maximize safe nutrition/hydration.  General    Pain Pain Assessment Pain Scale: 0-10 Pain Score: 0-No pain Pain Type: Acute pain Pain Location: Generalized Pain Orientation: Right Pain Radiating Towards: right side of body Pain Descriptors / Indicators: Aching Pain  Frequency: Intermittent Pain Onset: On-going Pain Intervention(s): Medication (See eMAR)  Therapy/Group: Individual Therapy  Loni Beckwith 01/23/2018, 4:42 PM

## 2018-01-23 NOTE — Progress Notes (Signed)
Occupational Therapy Session Note  Patient Details  Name: Caleb Hubbard MRN: [id number] Date of Birth: [birth year]  Today's Date: 01/23/2018 OT Individual Time: 1415-1500 OT Individual Time Calculation (min): 45 min    Short Term Goals: Week 2:  OT Short Term Goal 1 (Week 2): Pt will don shirt with min A OT Short Term Goal 2 (Week 2): Pt will manage LUE prior to transfers with min VC OT Short Term Goal 3 (Week 2): Pt will recall hemi dressing techniques with min VC OT Short Term Goal 4 (Week 2): Pt will thread BLE into pants with min A  Skilled Therapeutic Interventions/Progress Updates:    Treatment session with focus on activity tolerance and increased participation in treatment session.  Pt received supine in bed reporting fatigue but ultimately agreeable to engaging in therapeutic activity.  Completed squat pivot transfers mod - min assist with cues for increased initiation and participation in mobility.  Engaged in sit > stand with focus on improved motor control and then standing balance while engaging in horse shoe toss.  Incorporated reaching across midline to attempt to facilitate increased weight bearing through LLE and challenging balance.  Engaged in [place] 4 in sitting with focus on vision and attention to task.  Pt demonstrating decreased attention to visual stimulus on Lt during table top task, however able to see people when passing on Lt.  Pt returned to room and back to bed with min assist transfer and assist to advance LLE in to bed.  Pt left in sidelying with all needs in reach.  Therapy Documentation Precautions:  Precautions Precautions: Fall Precaution Comments: corretrack Restrictions Weight Bearing Restrictions: No General:   Vital Signs: Therapy Vitals Temp: 98 F (36.7 C) Pulse Rate: 94 Resp: 16 BP: 121/81 Patient Position (if appropriate): Lying Oxygen Therapy SpO2: 100 % O2 Device: Room Air Pain: Pain Assessment Pain Scale: 0-10 Pain Score: 6   Pain Type: Acute pain Pain Location: Generalized Pain Orientation: Right Pain Radiating Towards: right side of body Pain Descriptors / Indicators: Aching Pain Frequency: Intermittent Pain Onset: On-going Pain Intervention(s): Medication (See eMAR)   Therapy/Group: Individual Therapy  Simonne Come 01/23/2018, 4:06 PM

## 2018-01-24 ENCOUNTER — Inpatient Hospital Stay (HOSPITAL_COMMUNITY): Payer: Medicaid Other | Admitting: Physical Therapy

## 2018-01-24 ENCOUNTER — Inpatient Hospital Stay (HOSPITAL_COMMUNITY): Payer: Medicaid Other | Admitting: Occupational Therapy

## 2018-01-24 DIAGNOSIS — I69354 Hemiplegia and hemiparesis following cerebral infarction affecting left non-dominant side: Principal | ICD-10-CM

## 2018-01-24 NOTE — Progress Notes (Signed)
Slept from 2300-0500. after increase in trazodone. Patrici Ranks A

## 2018-01-24 NOTE — Progress Notes (Signed)
Occupational Therapy Session Note  Patient Details  Name: Caleb Hubbard MRN: [id number] Date of Birth: [birth year]  Today's Date: 01/24/2018 OT Individual Time: 0700-0725 OT Individual Time Calculation (min): 25 min  and Today's Date: 01/24/2018 OT Missed Time: 35 Minutes Missed Time Reason: Patient unwilling/refused to participate without medical reason;Patient fatigue   Short Term Goals: Week 2:  OT Short Term Goal 1 (Week 2): Pt will don shirt with min A OT Short Term Goal 2 (Week 2): Pt will manage LUE prior to transfers with min VC OT Short Term Goal 3 (Week 2): Pt will recall hemi dressing techniques with min VC OT Short Term Goal 4 (Week 2): Pt will thread BLE into pants with min A  Skilled Therapeutic Interventions/Progress Updates:    Treatment session with focus on increased initiation and participation in functional tasks.  Pt received supine in bed easily aroused.  Pt declined bathing/dressing this session.  Agreeable to eating breakfast seated at EOB to focus on sitting balance.  Pt demonstrating LLE activation to initiate hip adduction as needed for rolling.  Completed remainder of rolling and up to sitting at EOB with min assist.  Therapist assisted with opening containers, but pt able to add condiments once opened and engage in self-feeding.  Pt maintained unsupported sitting balance with supervision.  Once finished, pt pushed tray away and requested to return to bed.  Therapist encouraged pt to engage in self-care tasks of LUE NMR with pt refusing.  Encouraged pt to participate during later sessions as his mom planned to be present for therapy sessions.  Pt left in sidelying with all needs in reach.  Therapy Documentation Precautions:  Precautions Precautions: Fall Precaution Comments: corretrack Restrictions Weight Bearing Restrictions: No General: General OT Amount of Missed Time: 35 Minutes Vital Signs: Therapy Vitals Temp: 98.1 F (36.7 C) Pulse Rate: 95 Resp:  12 BP: 118/84 Patient Position (if appropriate): Lying Oxygen Therapy SpO2: 100 % O2 Device: Room Air Pain:  Pt with no c/o pain  Therapy/Group: Individual Therapy  Simonne Come 01/24/2018, 7:41 AM

## 2018-01-24 NOTE — Progress Notes (Signed)
Occupational Therapy Session Note  Patient Details  Name: Caleb Hubbard MRN: [id number] Date of Birth: 04-29-1980  Today's Date: 01/24/2018 OT Individual Time: 1400-1439 OT Individual Time Calculation (min): 39 min    Short Term Goals: Week 2:  OT Short Term Goal 1 (Week 2): Pt will don shirt with min A OT Short Term Goal 2 (Week 2): Pt will manage LUE prior to transfers with min VC OT Short Term Goal 3 (Week 2): Pt will recall hemi dressing techniques with min VC OT Short Term Goal 4 (Week 2): Pt will thread BLE into pants with min A  Skilled Therapeutic Interventions/Progress Updates:    Treatment session with focus on ADL retraining with bathing at shower level.  Pt received supine in bed with mom, dad, sister, and brother present upon arrival.  Discussed d/c plan with pt's mother stating he is to d/c with her to [place] and she and dad will be able to provide assistance.  Pt's mom reports they have 4 steps to enter and pt will stay on main level with walk-in shower on main level.  Therapist mentioned ramp as possibility for home entrance, mom demonstrated apprehension stating that the stairs are in the garage in which she parks her car - plan to further address.  Pt requesting shower this session, all family except sister left.  Completed stand pivot transfer bed > w/c > shower bench with mod fading to min assist.  Engaged in bathing with mod cues for sequencing and thoroughness.  Completed sit > stand with min assist to allow therapist to wash buttocks.  Max assist for LB dressing and mod assist with cues for sequencing for UB dressing.  Pt returned to bed at end of session and left semi-reclined with sister present and all needs in reach.  Therapy Documentation Precautions:  Precautions Precautions: Fall Precaution Comments: corretrack Restrictions Weight Bearing Restrictions: No Pain:  Pt with no c/o pain   Therapy/Group: Individual Therapy  Simonne Come 01/24/2018, 2:46  PM

## 2018-01-24 NOTE — Progress Notes (Signed)
Physical Therapy Session Note  Patient Details  Name: Caleb Hubbard MRN: [id number] Date of Birth: 03-Mar-1980  Today's Date: 01/24/2018 PT Individual Time: 1100-1155 PT Individual Time Calculation (min): 55 min   Short Term Goals: Week 1:  PT Short Term Goal 1 (Week 1): Pt will initiate gait training PT Short Term Goal 1 - Progress (Week 1): Met PT Short Term Goal 2 (Week 1): Pt will initiate w/c mobility PT Short Term Goal 2 - Progress (Week 1): Met PT Short Term Goal 3 (Week 1): Pt will maintain static standing balance with min A PT Short Term Goal 3 - Progress (Week 1): Not progressing  Skilled Therapeutic Interventions/Progress Updates:  Pt was seen bedside in the am with family at bedside. Pt requested family wait in room during treatment. Pt propelled w/c about 75 feet with R LE and UE and S with verbal cues. Once in gym, pt performed multiple sit to stand transfers with min to mod A and verbal cues. Pt performed multiple stand pivot transfers with mod A and verbal cues. Introduced Patent examiner and worked on standing balance, upright posture as well as pre gait activities. Towards end of session, pt stopped interacting with therapist and refused to participate any further. Attempted to reengage pt without success. Explained to pt that if he wanted to be done with therapy he would have to propel w/c back to room. Pt propelled w/c about 125 feet with S utilizing R UE and LE with occasional rest breaks. Pt has tendency to self. Pt left sitting up in w/c with family at bedside.   Therapy Documentation Precautions:  Precautions Precautions: Fall Precaution Comments: corretrack Restrictions Weight Bearing Restrictions: No General:   Vital Signs:   Pain: No c/o pain.    Therapy/Group: Individual Therapy  Dub Amis 01/24/2018, 12:33 PM

## 2018-01-24 NOTE — Progress Notes (Signed)
[facility] PHYSICAL MEDICINE & REHABILITATION PROGRESS NOTE   Subjective/Complaints:  Slept better last night. Awake when I came in.   ROS: Limited due to cognitive/behavioral   Objective:   No results found. Recent Labs    01/23/18 0718  WBC 13.0*  HGB 8.9*  HCT 27.8*  PLT 790*   No results for input(s): NA, K, CL, CO2, GLUCOSE, BUN, CREATININE, CALCIUM in the last 72 hours.  Intake/Output Summary (Last 24 hours) at 01/24/2018 1148 Last data filed at 01/24/2018 0745 Gross per 24 hour  Intake 360 ml  Output -  Net 360 ml     Physical Exam: Vital Signs Blood pressure 118/84, pulse 95, temperature 98.1 F (36.7 C), resp. rate 12, height 5\' 11"  (1.803 m), weight 55.9 kg, SpO2 100 %.  Constitutional: No distress . Vital signs reviewed. HEENT: EOMI, oral membranes moist Neck: supple Cardiovascular: RRR without murmur. No JVD    Respiratory: CTA Bilaterally without wheezes or rales. Normal effort    GI: BS +, non-tender, non-distended  Skin: No evidence of breakdown, no evidence of rash Neurologic: Cranial nerves II through XII intact, motor strength is 4/5 in right deltoid, bicep, tricep, grip, hip flexor, knee extensors, ankle dorsiflexor and plantar flexor, 0/5 on left except 3+ at trap  Tone MAS 2 Left hamstrings and Left elbow flexors--unchanged motor exam Sensory exam normal sensation to light touch and proprioception in left  lower extremities, diminished LUE--stable Cerebellar exam normal finger to nose to finger as well as heel to shin in bilateral upper and lower extremities Musculoskeletal: Full range of motion in all 4 extremities. No joint swelling    Assessment/Plan: 1. 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  Physiatrist is providing close team supervision and 24 hour management of active  medical problems listed below.  Physiatrist and rehab team continue to assess barriers to discharge/monitor patient progress toward functional and medical goals  Care Tool:  Bathing  Bathing activity did not occur: Refused Body parts bathed by patient: Chest, Right upper leg, Left upper leg, Face, Left arm, Abdomen, Front perineal area   Body parts bathed by helper: Buttocks, Right lower leg, Left lower leg, Right arm     Bathing assist Assist Level: Moderate Assistance - Patient 50 - 74%     Upper Body Dressing/Undressing Upper body dressing   What is the patient wearing?: Pull over shirt    Upper body assist Assist Level: Moderate Assistance - Patient 50 - 74%    Lower Body Dressing/Undressing Lower body dressing      What is the patient wearing?: Pants, Incontinence brief     Lower body assist Assist for lower body dressing: Maximal Assistance - Patient 25 - 49%     Toileting Toileting    Toileting assist Assist for toileting: Moderate Assistance - Patient 50 - 74%     Transfers Chair/bed transfer  Transfers assist  Chair/bed transfer activity did not occur: N/A  Chair/bed transfer assist level: Moderate Assistance - Patient 50 - 74%     Locomotion Ambulation   Ambulation assist   Ambulation activity did not occur: Safety/medical concerns  Assist level: Maximal Assistance - Patient 25 - 49% Assistive device: (rail) Max distance: 25 ft   Walk 10 feet activity   Assist  Walk 10 feet activity did not occur: Safety/medical concerns  Assist level: Maximal Assistance -  Patient 25 - 49% Assistive device: (rail)   Walk 50 feet activity   Assist Walk 50 feet with 2 turns activity did not occur: Safety/medical concerns         Walk 150 feet activity   Assist Walk 150 feet activity did not occur: Safety/medical concerns         Walk 10 feet on uneven surface  activity   Assist Walk 10 feet on uneven surfaces activity did not occur:  Safety/medical concerns         Wheelchair     Assist Will patient use wheelchair at discharge?: Yes Type of Wheelchair: Manual Wheelchair activity did not occur: Safety/medical concerns  Wheelchair assist level: Supervision/Verbal cueing Max wheelchair distance: 40 ft     Wheelchair 50 feet with 2 turns activity    Assist    Wheelchair 50 feet with 2 turns activity did not occur: Safety/medical concerns   Assist Level: Supervision/Verbal cueing   Wheelchair 150 feet activity     Assist Wheelchair 150 feet activity did not occur: Safety/medical concerns        Medical Problem List and Plan: 1. 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 DVT Prophylaxis/Anticoagulation: Pharmaceutical:Lovenox instead of heparin, to reduce needle sticks and increase efficacy in post stroke population 3. Pain Management:tylenol prn 4. Mood:Denies anxiety or depression. Monitor for now. LCSW to follow for evaluation and support. 5. Neuropsych: This patientis not fullycapable of making decisions on hisown behalf. 6. Skin/Wound Care:routine pressure relief measures. 7. Fluids/Electrolytes/Nutrition:Monitor I/O.   8. TUU:EKCMKLK BID. Continue metoprolol and catapres. Vitals:   01/23/18 2005 01/24/18 0356  BP: 115/90 118/84  Pulse: 97 95  Resp: 16 12  Temp: 98.9 F (37.2 C) 98.1 F (36.7 C)  SpO2: 100% 100%  Borderline control 12/14    9. Pancytopenia:Resolved Has resolved post Granix--d/c neutropenic precautions.Leukocytosis may be related to granix, also with thrombocytosis and anemia,appears to be trending down  Cont to monitor. may need to contact oncology/hematology  -follow up cbc Monday  10. Prediabetes: Hgb A1c- 6.4. Monitor BS ac/hs and use SSI for elevated BS 11. Testicular cancer:Dr.  Magrinatrecommends no further chemo and monitoring AFP/HCG every 6 months X 2 years.  12. Hypertriglyceridemia:On lipitor. 13. Depression: Was on Zyprexa for mood stabilization.  Now on trazodone  (also helping with excess drooling) Neuropsych is following  -increased trazodone beneficial for sleep 14.  LUE and LLE limb pain non focal , likely CVA related and a combination of increased tone and neurogenic pain  - increased tizanidine to 4mg  qhs  - gabapentin 100mg  qhs- monitor for sedation   LOS: 9 days A FACE TO FACE EVALUATION WAS PERFORMED  Meredith Staggers 01/24/2018, 11:48 AM

## 2018-01-25 ENCOUNTER — Inpatient Hospital Stay (HOSPITAL_COMMUNITY): Payer: Medicaid Other | Admitting: Physical Therapy

## 2018-01-25 ENCOUNTER — Inpatient Hospital Stay (HOSPITAL_COMMUNITY): Payer: Medicaid Other

## 2018-01-25 NOTE — Plan of Care (Signed)
  Problem: Spiritual Needs Goal: Ability to function at adequate level Outcome: Progressing   Problem: Consults Goal: RH STROKE PATIENT EDUCATION Description See Patient Education module for education specifics  Outcome: Progressing Goal: Nutrition Consult-if indicated Outcome: Progressing   Problem: RH BOWEL ELIMINATION Goal: RH STG MANAGE BOWEL WITH ASSISTANCE Description STG Manage Bowel with min Assistance.  Outcome: Progressing Goal: RH STG MANAGE BOWEL W/MEDICATION W/ASSISTANCE Description STG Manage Bowel with Medication with min Assistance.  Outcome: Progressing   Problem: RH SKIN INTEGRITY Goal: RH STG SKIN FREE OF INFECTION/BREAKDOWN Description Patients skin will remain free from further infection or breakdown with mod assist.   Outcome: Progressing Goal: RH STG MAINTAIN SKIN INTEGRITY WITH ASSISTANCE Description STG Maintain Skin Integrity With mod Assistance.  Outcome: Progressing   Problem: RH SAFETY Goal: RH STG ADHERE TO SAFETY PRECAUTIONS W/ASSISTANCE/DEVICE Description STG Adhere to Safety Precautions With min Assistance/Device.  Outcome: Progressing   Problem: RH COGNITION-NURSING Goal: RH STG USES MEMORY AIDS/STRATEGIES W/ASSIST TO PROBLEM SOLVE Description STG Uses Memory Aids/Strategies With min Assistance to Problem Solve.  Outcome: Progressing   Problem: RH KNOWLEDGE DEFICIT Goal: RH STG INCREASE KNOWLEDGE OF HYPERTENSION Description Min assist  Outcome: Progressing Goal: RH STG INCREASE KNOWLEDGE OF DYSPHAGIA/FLUID INTAKE Description Min assist  Outcome: Progressing Goal: RH STG INCREASE KNOWLEGDE OF HYPERLIPIDEMIA Description Min assist  Outcome: Progressing Goal: RH STG INCREASE KNOWLEDGE OF STROKE PROPHYLAXIS Description Min assist  Outcome: Progressing

## 2018-01-25 NOTE — Progress Notes (Signed)
Physical Therapy Session Note  Patient Details  Name: Caleb Hubbard MRN: [id number] Date of Birth: 12/25/1980  Today's Date: 01/25/2018 PT Individual Time: 6580-0634 PT Individual Time Calculation (min): 30 min   Short Term Goals: Week 1:  PT Short Term Goal 1 (Week 1): Pt will initiate gait training PT Short Term Goal 1 - Progress (Week 1): Met PT Short Term Goal 2 (Week 1): Pt will initiate w/c mobility PT Short Term Goal 2 - Progress (Week 1): Met PT Short Term Goal 3 (Week 1): Pt will maintain static standing balance with min A PT Short Term Goal 3 - Progress (Week 1): Not progressing  Skilled Therapeutic Interventions/Progress Updates:  Pt was seen bedside in the pm. Pt willing to participate with encouragement of therapist and girlfriend. Pt performed multiple sit to stand transfers with hemiwalker and min to mod A with verbal cues. Pt ambulated about 5 feet with hemiwalker and max A with verbal cues. Physical assist to advance L LE. Donned L AFO. Pt ambulated 15 and 20 feet with hemiwalker and mod to max A with verbal cues. Also noted improved ability to advance L LE with AFO. Following second ambulation with AFO, pt stated he was finished with therapy, stating "No more". Attempted to encourage participation without success. Pt verbalized frustration with this whole process, but unwilling to participate further. Treatment session end. Pt left sitting up in w/c with chair alarm in place and girlfriend pushing pt around the unit.   Therapy Documentation Precautions:  Precautions Precautions: Fall Precaution Comments: corretrack Restrictions Weight Bearing Restrictions: No General: PT Amount of Missed Time (min): 30 Minutes PT Missed Treatment Reason: Patient unwilling to participate;Patient fatigue Vital Signs:  Pain: No c/o pain.  Therapy/Group: Individual Therapy  Dub Amis 01/25/2018, 1:37 PM

## 2018-01-25 NOTE — Progress Notes (Signed)
Occupational Therapy Session Note  Patient Details  Name: Caleb Hubbard MRN: [id number] Date of Birth: 1980-02-18  Today's Date: 01/25/2018 OT Individual Time: 1430-1500 OT Individual Time Calculation (min): 30 min    Short Term Goals: Week 1:  OT Short Term Goal 1 (Week 1): Pt will transfer to BSC/toilet consistantly iwht Min A and LRAD OT Short Term Goal 1 - Progress (Week 1): Met OT Short Term Goal 2 (Week 1): Pt will don shirt wiht min A OT Short Term Goal 2 - Progress (Week 1): Progressing toward goal OT Short Term Goal 3 (Week 1): Pt will manage LUE prior to transfers wiht min VC OT Short Term Goal 3 - Progress (Week 1): Progressing toward goal OT Short Term Goal 4 (Week 1): pt will recall hemi dressing techniques with min VC OT Short Term Goal 4 - Progress (Week 1): Progressing toward goal OT Short Term Goal 5 (Week 1): Pt will thread BLE into pants with min A OT Short Term Goal 5 - Progress (Week 1): Progressing toward goal  Skilled Therapeutic Interventions/Progress Updates:    1:1. With MAX encouragement from sister and OT pt agreeable to make up time/session from earlier this morning. Pt stand pivot transfer with MIN A EOB<>w/c with multiple attempts with OT providing A when pt verbalized to OT for initation. Pt propels w/c with heim technique to dayroom with min A overall for steering. Pt stands 3x to play Wii golf with mod-max A for standing balance d/t posterior lean with L knee block and max VC for weight shifting. When asked, pt states, "I dont feel like talking today, it isnt you." Pt returned to room with call light in reach, bed alarm on and sister inroom  Therapy Documentation Precautions:  Precautions Precautions: Fall Precaution Comments: corretrack Restrictions Weight Bearing Restrictions: No General: General OT Amount of Missed Time: 25 Minutes PT Missed Treatment Reason: Patient unwilling to participate;Patient fatigue Vital Signs:  Pain:    ADL: ADL Eating: Moderate cueing Where Assessed-Eating: Wheelchair Upper Body Bathing: Minimal assistance Where Assessed-Upper Body Bathing: Sitting at sink Lower Body Bathing: Moderate assistance Where Assessed-Lower Body Bathing: Wheelchair Upper Body Dressing: Moderate assistance Lower Body Dressing: Maximal assistance Toileting: Maximal assistance Where Assessed-Toileting: Glass blower/designer: Moderate assistance Toilet Transfer Equipment: Grab bars, Raised toilet seat Vision   Perception    Praxis   Exercises:   Other Treatments:     Therapy/Group: Individual Therapy  Tonny Branch 01/25/2018, 2:59 PM

## 2018-01-25 NOTE — Progress Notes (Signed)
Poor night sleep. Patient without specific complaint of. Denies pain.Caleb Hubbard

## 2018-01-25 NOTE — Progress Notes (Signed)
Occupational Therapy Session Note  Patient Details  Name: Caleb Hubbard MRN: [id number] Date of Birth: [birth year]  Today's Date: 01/25/2018 OT Individual Time: 1015-1105 OT Individual Time Calculation (min): 50 min    Short Term Goals: Week 1:  OT Short Term Goal 1 (Week 1): Pt will transfer to BSC/toilet consistantly iwht Min A and LRAD OT Short Term Goal 1 - Progress (Week 1): Met OT Short Term Goal 2 (Week 1): Pt will don shirt wiht min A OT Short Term Goal 2 - Progress (Week 1): Progressing toward goal OT Short Term Goal 3 (Week 1): Pt will manage LUE prior to transfers wiht min VC OT Short Term Goal 3 - Progress (Week 1): Progressing toward goal OT Short Term Goal 4 (Week 1): pt will recall hemi dressing techniques with min VC OT Short Term Goal 4 - Progress (Week 1): Progressing toward goal OT Short Term Goal 5 (Week 1): Pt will thread BLE into pants with min A OT Short Term Goal 5 - Progress (Week 1): Progressing toward goal  Skilled Therapeutic Interventions/Progress Updates:    1:1. Pt initally nodding/gesturing for communication however with time pt verbalizes more. Pt completes stand pivot transfers (closed chain iwht grab bar/bed rail) with min-mod A overall and VC for foot placement/hand placement and scooting to [place] prior to transfer. Pt bathes with encouragement to actually wash instead of just touch each body part 1x with wash cloth. Pt stands with MIN A in shower to wash buttocks. Pt dresses at sink with min A for threading LUE into shirt and multimodal cueing for hemi techniques. Pt dons pants with MIN A for steadying to advance pants past hips. Pt able to thread BLE by reaching forward grabbing ankle to cross into figure 4 (CGA provided to keep LLE from flopping to floor). Pt able to don R sock with VC for hemi strategy. Pt states, "you make me do more than anyone else." Educated pt on practicing hemi strategies to improve efficiency/performance in prep for d/c. Pt requesting  to begin transfer training with girlfriend. OT educates girlfriend on knee blocking, providing support at hips, body mechanics, functional communication for pt initiation, closed chain transfers and foot placement. OT demos transfer into bed. Girlfriend able to complete same transfer w/c>EOB. OT educates that at this time not able to check girlfriend off to complete on own and pt/girlfriend agree. Pt lies back down and says, "Im done." Despite encouragement pt unwilling to keep participating in tx. Pt missed 25 min of skilled OT. Will follow up per POC. Exited session with pt seated in bed, call light in reach exit alarm on and girlfriend in room  Therapy Documentation Precautions:  Precautions Precautions: Fall Precaution Comments: corretrack Restrictions Weight Bearing Restrictions: No   Therapy/Group: Individual Therapy  Tonny Branch 01/25/2018, 11:18 AM

## 2018-01-25 NOTE — Progress Notes (Signed)
[facility] PHYSICAL MEDICINE & REHABILITATION PROGRESS NOTE   Subjective/Complaints:  No new issues. Doesn't offer much this morning  ROS: Limited due to cognitive/behavioral   Objective:   No results found. Recent Labs    01/23/18 0718  WBC 13.0*  HGB 8.9*  HCT 27.8*  PLT 790*   No results for input(s): NA, K, CL, CO2, GLUCOSE, BUN, CREATININE, CALCIUM in the last 72 hours.  Intake/Output Summary (Last 24 hours) at 01/25/2018 1409 Last data filed at 01/25/2018 1314 Gross per 24 hour  Intake 600 ml  Output 100 ml  Net 500 ml     Physical Exam: Vital Signs Blood pressure (!) 125/92, pulse 90, temperature 98.2 F (36.8 C), resp. rate 16, height 5\' 11"  (1.803 m), weight 53.2 kg, SpO2 100 %.  Constitutional: No distress . Vital signs reviewed. HEENT: EOMI, oral membranes moist Neck: supple Cardiovascular: RRR without murmur. No JVD    Respiratory: CTA Bilaterally without wheezes or rales. Normal effort    GI: BS +, non-tender, non-distended  Skin: No evidence of breakdown, no evidence of rash Neurologic: Cranial nerves II through XII intact, motor strength is 4/5 in right deltoid, bicep, tricep, grip, hip flexor, knee extensors, ankle dorsiflexor and plantar flexor, 0/5 on left except 3+ at trap  Tone MAS 2 Left hamstrings and Left elbow flexors--unchanged motor exam Sensory exam wnl Musculoskeletal: Full range of motion in all 4 extremities. No joint swelling    Assessment/Plan: 1. 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  Physiatrist is providing close team supervision and 24 hour management of active medical problems listed below.  Physiatrist and rehab team continue to assess barriers to discharge/monitor patient progress toward functional and medical goals  Care Tool:  Bathing  Bathing  activity did not occur: Refused Body parts bathed by patient: Chest, Abdomen, Front perineal area, Right upper leg, Left upper leg, Right lower leg, Face, Left arm   Body parts bathed by helper: Right arm, Left arm, Buttocks, Left lower leg, Right lower leg     Bathing assist Assist Level: Moderate Assistance - Patient 50 - 74%     Upper Body Dressing/Undressing Upper body dressing   What is the patient wearing?: Pull over shirt    Upper body assist Assist Level: Minimal Assistance - Patient > 75%    Lower Body Dressing/Undressing Lower body dressing      What is the patient wearing?: Pants, Incontinence brief     Lower body assist Assist for lower body dressing: Moderate Assistance - Patient 50 - 74%     Toileting Toileting    Toileting assist Assist for toileting: Moderate Assistance - Patient 50 - 74%     Transfers Chair/bed transfer  Transfers assist  Chair/bed transfer activity did not occur: N/A  Chair/bed transfer assist level: Moderate Assistance - Patient 50 - 74%     Locomotion Ambulation   Ambulation assist   Ambulation activity did not occur: Safety/medical concerns  Assist level: Maximal Assistance - Patient 25 - 49% Assistive device: Walker-hemi Max distance: 20   Walk 10 feet activity   Assist  Walk 10 feet activity did not occur: Safety/medical concerns  Assist level: Maximal Assistance - Patient 25 - 49% Assistive device: Walker-hemi   Walk 50 feet activity   Assist Walk 50 feet with 2 turns activity did not occur: Safety/medical concerns  Walk 150 feet activity   Assist Walk 150 feet activity did not occur: Safety/medical concerns         Walk 10 feet on uneven surface  activity   Assist Walk 10 feet on uneven surfaces activity did not occur: Safety/medical concerns         Wheelchair     Assist Will patient use wheelchair at discharge?: Yes Type of Wheelchair: Manual Wheelchair activity did not  occur: Safety/medical concerns  Wheelchair assist level: Supervision/Verbal cueing Max wheelchair distance: 125    Wheelchair 50 feet with 2 turns activity    Assist    Wheelchair 50 feet with 2 turns activity did not occur: Safety/medical concerns   Assist Level: Supervision/Verbal cueing   Wheelchair 150 feet activity     Assist Wheelchair 150 feet activity did not occur: Safety/medical concerns        Medical Problem List and Plan: 1. 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 DVT Prophylaxis/Anticoagulation: Pharmaceutical:Lovenox instead of heparin, to reduce needle sticks and increase efficacy in post stroke population 3. Pain Management:tylenol prn 4. Mood:Denies anxiety or depression. Monitor for now. LCSW to follow for evaluation and support. 5. Neuropsych: This patientis not fullycapable of making decisions on hisown behalf. 6. Skin/Wound Care:routine pressure relief measures. 7. Fluids/Electrolytes/Nutrition:Monitor I/O.   8. IRC:VELFYBO BID. Continue metoprolol and catapres. Vitals:   01/24/18 2001 01/25/18 0421  BP: 130/84 (!) 125/92  Pulse: (!) 105 90  Resp: 14 16  Temp: 98.7 F (37.1 C) 98.2 F (36.8 C)  SpO2: 100% 100%  Borderline control 12/15    9. Pancytopenia:Resolved Has resolved post Granix--d/c neutropenic precautions.Leukocytosis may be related to granix, also with thrombocytosis and anemia,appears to be trending down  Cont to monitor. may need to contact oncology/hematology  -follow up cbc Monday  10. Prediabetes: Hgb A1c- 6.4. Monitor BS ac/hs and use SSI for elevated BS 11. Testicular cancer:Dr. Magrinatrecommends no further chemo and monitoring AFP/HCG every 6 months X 2 years.  12. Hypertriglyceridemia:On lipitor. 13. Depression: Was on Zyprexa for mood  stabilization.  Now on trazodone  (also helping with excess drooling) Neuropsych is following  -increased trazodone which has been beneficial for sleep 14.  LUE and LLE limb pain non focal , likely CVA related and a combination of increased tone and neurogenic pain  - increased tizanidine to 4mg  qhs  - gabapentin 100mg  qhs- monitor for sedation   LOS: 10 days A FACE TO FACE EVALUATION WAS PERFORMED  Meredith Staggers 01/25/2018, 2:09 PM

## 2018-01-26 ENCOUNTER — Inpatient Hospital Stay (HOSPITAL_COMMUNITY): Payer: Medicaid Other

## 2018-01-26 ENCOUNTER — Inpatient Hospital Stay (HOSPITAL_COMMUNITY): Payer: Medicaid Other | Admitting: Physical Therapy

## 2018-01-26 LAB — CBC
HCT: 28.4 % — ABNORMAL LOW (ref 39.0–52.0)
Hemoglobin: 9.1 g/dL — ABNORMAL LOW (ref 13.0–17.0)
MCH: 31.2 pg (ref 26.0–34.0)
MCHC: 32 g/dL (ref 30.0–36.0)
MCV: 97.3 fL (ref 80.0–100.0)
Platelets: 684 10*3/uL — ABNORMAL HIGH (ref 150–400)
RBC: 2.92 MIL/uL — ABNORMAL LOW (ref 4.22–5.81)
RDW: 15.4 % (ref 11.5–15.5)
WBC: 11.3 10*3/uL — ABNORMAL HIGH (ref 4.0–10.5)
nRBC: 0 % (ref 0.0–0.2)

## 2018-01-26 LAB — BASIC METABOLIC PANEL
Anion gap: 11 (ref 5–15)
BUN: 17 mg/dL (ref 6–20)
CO2: 25 mmol/L (ref 22–32)
Calcium: 9.9 mg/dL (ref 8.9–10.3)
Chloride: 100 mmol/L (ref 98–111)
Creatinine, Ser: 1.03 mg/dL (ref 0.61–1.24)
GFR calc Af Amer: >60 mL/min (ref >60)
GFR calc non Af Amer: >60 mL/min (ref >60)
Glucose, Bld: 106 mg/dL — ABNORMAL HIGH (ref 70–99)
Potassium: 4.3 mmol/L (ref 3.5–5.1)
Sodium: 136 mmol/L (ref 135–145)

## 2018-01-26 NOTE — Progress Notes (Signed)
Occupational Therapy Session Note  Patient Details  Name: Caleb Hubbard MRN: [id number] Date of Birth: [birth year]  Today's Date: 01/26/2018 OT Individual Time: 9672-2773 OT Individual Time Calculation (min): 45 min    Short Term Goals: Week 2:  OT Short Term Goal 1 (Week 2): Pt will don shirt with min A OT Short Term Goal 2 (Week 2): Pt will manage LUE prior to transfers with min VC OT Short Term Goal 3 (Week 2): Pt will recall hemi dressing techniques with min VC OT Short Term Goal 4 (Week 2): Pt will thread BLE into pants with min A  Skilled Therapeutic Interventions/Progress Updates:    Session focused on b/d tasks and functional transfers. Pt with active hallucinations during session, able to be redirected to task. Pt completed SPT to w/c and into walk in shower with min/mod A. Mod cues for positioning of LLE during transfers. Pt required frequent directing cues during shower to wash each body part thoroughly. Mod A for peri hygiene in standing. Pt donned shirt with max A, unclear as to whether he was not understanding or following directions. Pt donned LB clothing with max A overall. Pt required set up to complete oral hygiene at sink . Pt left sitting up with all needs met, chair alarm set.   Therapy Documentation Precautions:  Precautions Precautions: Fall Precaution Comments: corretrack Restrictions Weight Bearing Restrictions: No Vital Signs: Therapy Vitals Temp: 98.1 F (36.7 C) Pulse Rate: 89 Resp: 16 BP: 135/90 Patient Position (if appropriate): Lying Oxygen Therapy SpO2: 100 % O2 Device: Room Air Pain:  No pain reported throughout session.    Therapy/Group: Individual Therapy  Curtis Sites 01/26/2018, 8:59 AM

## 2018-01-26 NOTE — Progress Notes (Signed)
[place] PHYSICAL MEDICINE & REHABILITATION PROGRESS NOTE   Subjective/Complaints:  Pt lying in bed. Will not speak to me and turns his head away frequently when I address him.   ROS: Limited due to cognitive/behavioral   Objective:   No results found. Recent Labs    01/26/18 0530  WBC 11.3*  HGB 9.1*  HCT 28.4*  PLT 684*   Recent Labs    01/26/18 0530  NA 136  K 4.3  CL 100  CO2 25  GLUCOSE 106*  BUN 17  CREATININE 1.03  CALCIUM 9.9    Intake/Output Summary (Last 24 hours) at 01/26/2018 0910 Last data filed at 01/26/2018 0901 Gross per 24 hour  Intake 942 ml  Output -  Net 942 ml     Physical Exam: Vital Signs Blood pressure 135/90, pulse 89, temperature 98.1 F (36.7 C), resp. rate 16, height 5\' 11"  (1.803 m), weight 53.2 kg, SpO2 100 %.  Constitutional: No distress . Vital signs reviewed. HEENT: EOMI, oral membranes moist Neck: supple Cardiovascular: RRR without murmur. No JVD    Respiratory: CTA Bilaterally without wheezes or rales. Normal effort    GI: BS +, non-tender, non-distended  Skin: No evidence of breakdown, no evidence of rash Neurologic: Cranial nerves II through XII intact, motor strength is 4/5 in right deltoid, bicep, tricep, grip, hip flexor, knee extensors, ankle dorsiflexor and plantar flexor, 0/5 on left except 3+ at trap  Tone MAS 2 Left hamstrings and Left elbow flexors--unchanged motor exam Sensory exam wnl Musculoskeletal: Full range of motion in all 4 extremities. No joint swelling    Assessment/Plan: 1. 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  Physiatrist is providing close team supervision and 24 hour management of active medical problems listed below.  Physiatrist and rehab team continue to assess barriers to discharge/monitor patient progress toward  functional and medical goals  Care Tool:  Bathing  Bathing activity did not occur: Refused Body parts bathed by patient: Chest, Abdomen, Front perineal area, Right upper leg, Left upper leg, Right lower leg, Face, Left arm   Body parts bathed by helper: Right arm, Left arm, Buttocks, Left lower leg, Right lower leg     Bathing assist Assist Level: Moderate Assistance - Patient 50 - 74%     Upper Body Dressing/Undressing Upper body dressing   What is the patient wearing?: Pull over shirt    Upper body assist Assist Level: Minimal Assistance - Patient > 75%    Lower Body Dressing/Undressing Lower body dressing      What is the patient wearing?: Pants, Incontinence brief     Lower body assist Assist for lower body dressing: Moderate Assistance - Patient 50 - 74%     Toileting Toileting    Toileting assist Assist for toileting: Moderate Assistance - Patient 50 - 74%     Transfers Chair/bed transfer  Transfers assist  Chair/bed transfer activity did not occur: N/A  Chair/bed transfer assist level: Moderate Assistance - Patient 50 - 74%     Locomotion Ambulation   Ambulation assist   Ambulation activity did not occur: Safety/medical concerns  Assist level: Maximal Assistance - Patient 25 - 49% Assistive device: Walker-hemi Max distance: 20   Walk 10 feet activity   Assist  Walk 10 feet activity did not occur: Safety/medical concerns  Assist level: Maximal Assistance - Patient 25 - 49%  Assistive device: Walker-hemi   Walk 50 feet activity   Assist Walk 50 feet with 2 turns activity did not occur: Safety/medical concerns         Walk 150 feet activity   Assist Walk 150 feet activity did not occur: Safety/medical concerns         Walk 10 feet on uneven surface  activity   Assist Walk 10 feet on uneven surfaces activity did not occur: Safety/medical concerns         Wheelchair     Assist Will patient use wheelchair at  discharge?: Yes Type of Wheelchair: Manual Wheelchair activity did not occur: Safety/medical concerns  Wheelchair assist level: Supervision/Verbal cueing Max wheelchair distance: 125    Wheelchair 50 feet with 2 turns activity    Assist    Wheelchair 50 feet with 2 turns activity did not occur: Safety/medical concerns   Assist Level: Supervision/Verbal cueing   Wheelchair 150 feet activity     Assist Wheelchair 150 feet activity did not occur: Safety/medical concerns        Medical Problem List and Plan: 1. 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 DVT Prophylaxis/Anticoagulation: Pharmaceutical:Lovenox instead of heparin, to reduce needle sticks and increase efficacy in post stroke population 3. Pain Management:tylenol prn 4. Mood:Denies anxiety or depression but I suspect depression is a major factor. D/w team 5. Neuropsych: This patientis not fullycapable of making decisions on hisown behalf. 6. Skin/Wound Care:routine pressure relief measures. 7. Fluids/Electrolytes/Nutrition:Monitor I/O.   8. WCB:JSEGBTD BID. Continue metoprolol and catapres. Vitals:   01/25/18 2004 01/26/18 0548  BP: (!) 121/95 135/90  Pulse: 90 89  Resp: 16 16  Temp: 98.8 F (37.1 C) 98.1 F (36.7 C)  SpO2: 100% 100%  Borderline control 12/16    9. Pancytopenia:Resolved Has resolved post Granix--d/c neutropenic precautions.Leukocytosis may be related to granix, also with thrombocytosis and anemia,appears to be trending down   -wbc's/ platelets trending down (11.3/684 on 12/16)  -hgb trending up 9.1  10. Prediabetes: Hgb A1c- 6.4. Monitor BS ac/hs and use SSI for elevated BS 11. Testicular cancer:Dr. Magrinatrecommends no further chemo and monitoring AFP/HCG every 6 months X 2 years.  12. Hypertriglyceridemia:On  lipitor. 13. Depression: Was on Zyprexa for mood stabilization.  Now on trazodone  (also helping with excess drooling) Neuropsych is following  -increased trazodone which has been beneficial for sleep 14.  LUE and LLE limb pain non focal , likely CVA related and a combination of increased tone and neurogenic pain  - increased tizanidine to 4mg  qhs  - gabapentin 100mg  qhs- monitor for sedation   LOS: 11 days A FACE TO FACE EVALUATION WAS PERFORMED  Meredith Staggers 01/26/2018, 9:10 AM

## 2018-01-26 NOTE — Progress Notes (Addendum)
Physical Therapy Session Note  Patient Details  Name: Caleb Hubbard MRN: [id number] Date of Birth: [birth year]  Today's Date: 01/26/2018 PT Individual Time: 9233-0076 PT Individual Time Calculation (min): 42 min   Short Term Goals: Week 2:  PT Short Term Goal 1 (Week 2): Pt will ambulate 30 ft with LRAD & mod assist.  PT Short Term Goal 2 (Week 2): Pt will complete bed<>w/c transfers with min assist.  PT Short Term Goal 3 (Week 2): Pt will complete bed mobility with CGA.  Skilled Therapeutic Interventions/Progress Updates:  Pt received in bed & agreeable to tx. Pt transfers to EOB with min assist and cuing to move RLE without UE assistance and then requires assistance from therapist to move LLE to EOB. Pt transfers bed>w/c on L with mod assist stand pivot. Therapist dons tennis shoes total assist for time management. Pt propels w/c room>unit doorway with R hemi technique and supervision with heavy encouragement and education for L attention during task with pt requiring significantly extra time to propel w/c. Pt transfers sit>stand with min assist with cuing for hand placement and ambulates 15 ft + 15 ft with hemiwalker & mod<>max assist for balance, LLE advancement although pt able to initiate step with LLE, blocking L knee to prevent A/P instability and weight shifting. Pt also requires cuing for placement of hemiwalker & sequencing gait with AD. Pt transfers w/c<>kinetron with mod<>max assist 2/2 decreased ability step and willingness to follow commands to do so. Pt utilized cybex kinetron in sitting with max encouragement with L Hamstring activation noted only on one occasion but question pt's overall engagement and attempt to complete entire task. At end of session pt left sitting in w/c in room with chair alarm donned, call bell in lap.   Pt remains disengaged with minimal eye contact and verbal communication with therapist during session.  No c/o pain reported.  Therapy  Documentation Precautions:  Precautions Precautions: Fall Precaution Comments: corretrack Restrictions Weight Bearing Restrictions: No     Therapy/Group: Individual Therapy  [name] 01/26/2018, 1:50 PM

## 2018-01-26 NOTE — Progress Notes (Signed)
Speech Language Pathology Daily Session Note  Patient Details  Name: Caleb Hubbard MRN: [id number] Date of Birth: October 30, 1980  Today's Date: 01/26/2018 SLP Individual Time: 3976-7341 SLP Individual Time Calculation (min): 43 min  Short Term Goals: Week 2: SLP Short Term Goal 1 (Week 2): Pt will utilize external memory aids to recall daily information with supervision cues.  SLP Short Term Goal 2 (Week 2): Pt will complex semi-complex problem solving tasks with Min A cues.  SLP Short Term Goal 3 (Week 2): Pt will demonstrate selective attention in mildly distracting environment for ~ 30 minutes with Min A cues.  SLP Short Term Goal 4 (Week 2): Pt will utilize speech intelligibility strategies to achieve ~ 90% speech intelligibility at the sentence level.  SLP Short Term Goal 5 (Week 2): Pt will consume current diet with minimal overt s/s of aspiration and supervision cues for use of compensatory strategies.   Skilled Therapeutic Interventions:Skilled ST services focused on swallow and cognitive skills. Pt was asleep upon entering room and required max A verbal cues to encourage participation in therapy session. Pt stated " my voice doesn't work", however pt demonstrated ability to communicate with reduce vocal intensity 70% intelligibility and only responding to question prompts no spontaneous speech.  SLP facilitated basic problem solving skills utilizing card task, Blink, pt required mod-min A verbal cues and supervision A verbal cues to recall rules. SLP facilitated PO consumption of breakfast tray dys 2 and thin via cup, pt demonstrated min s/s aspiration and mild left buccal pocketing requiring min A verbal cues for use of swallow strategies. Pt was left in room with call bell within reach and bed alarm set. Recommend to continue skilled ST services.      Pain Pain Assessment Pain Score: 0-No pain  Therapy/Group: Individual Therapy  Khrystyna Schwalm  [facility] 01/26/2018, 9:05 AM

## 2018-01-27 ENCOUNTER — Inpatient Hospital Stay (HOSPITAL_COMMUNITY): Payer: Medicaid Other

## 2018-01-27 ENCOUNTER — Inpatient Hospital Stay (HOSPITAL_COMMUNITY): Payer: Medicaid Other | Admitting: Occupational Therapy

## 2018-01-27 ENCOUNTER — Inpatient Hospital Stay (HOSPITAL_COMMUNITY): Payer: Medicaid Other | Admitting: Physical Therapy

## 2018-01-27 MED ORDER — SERTRALINE HCL 50 MG PO TABS
25.0000 mg | ORAL_TABLET | Freq: Every day | ORAL | Status: DC
  Administered 2018-01-27 – 2018-02-09 (×14): 25 mg via ORAL
  Filled 2018-01-27 (×14): qty 1

## 2018-01-27 MED ORDER — METHYLPHENIDATE HCL 5 MG PO TABS
10.0000 mg | ORAL_TABLET | Freq: Two times a day (BID) | ORAL | Status: DC
  Administered 2018-01-27 – 2018-01-28 (×3): 10 mg via ORAL
  Filled 2018-01-27 (×3): qty 2

## 2018-01-27 MED ORDER — ENSURE ENLIVE PO LIQD
237.0000 mL | Freq: Three times a day (TID) | ORAL | Status: DC
  Administered 2018-01-27 – 2018-02-07 (×17): 237 mL via ORAL
  Filled 2018-01-27 (×2): qty 237

## 2018-01-27 MED ORDER — SERTRALINE HCL 20 MG/ML PO CONC: 25.0000 mg | Freq: Every day | ORAL | Status: DC

## 2018-01-27 NOTE — Progress Notes (Signed)
Occupational Therapy Session Note  Patient Details  Name: Caleb Hubbard MRN: [id number] Date of Birth: Apr 20, 1980  Today's Date: 01/27/2018 OT Individual Time: 1300-1357 OT Individual Time Calculation (min): 57 min    Short Term Goals: Week 2:  OT Short Term Goal 1 (Week 2): Pt will don shirt with min A OT Short Term Goal 2 (Week 2): Pt will manage LUE prior to transfers with min VC OT Short Term Goal 3 (Week 2): Pt will recall hemi dressing techniques with min VC OT Short Term Goal 4 (Week 2): Pt will thread BLE into pants with min A  Skilled Therapeutic Interventions/Progress Updates:    Treatment session with focus on functional transfers and activity tolerance.  Pt received upright in bed finishing lunch.  Therapist provided min cues for awareness of Lt pocketing during eating - pt ultimately spitting out a large amount of chicken.  Pt completed bed mobility with min assist and stand pivot transfer mod assist.  Engaged in large Connect 4 with focus on sitting balance and participation in structured activity.  Encouraged pt to utilize LUE with hand over hand assist during reaching with Connect 4, pt resistant to incorporating LUE in any activity.  Pt reports need to toilet.  Completed stand pivot w/c <> toilet with min assist, therapist assisting with clothing pre and post toileting.  Noted slight shoulder elevation in sitting this session, however pt not encouraged by movement.  Pt returned to bed at end of session and left semi-reclined with all needs in reach.  Therapy Documentation Precautions:  Precautions Precautions: Fall Precaution Comments: corretrack Restrictions Weight Bearing Restrictions: No General:   Vital Signs: Therapy Vitals Temp: 98.2 F (36.8 C) Pulse Rate: 94 Resp: 18 BP: 121/86 Patient Position (if appropriate): Lying Oxygen Therapy SpO2: 100 % O2 Device: Room Air Pain: Pain Assessment Pain Scale: 0-10 Pain Score: 0-No pain   Therapy/Group:  Individual Therapy  Simonne Come 01/27/2018, 4:16 PM

## 2018-01-27 NOTE — Plan of Care (Signed)
  Problem: Spiritual Needs Goal: Ability to function at adequate level Outcome: Progressing   Problem: Consults Goal: RH STROKE PATIENT EDUCATION Description See Patient Education module for education specifics  Outcome: Progressing Goal: Nutrition Consult-if indicated Outcome: Progressing   Problem: RH BOWEL ELIMINATION Goal: RH STG MANAGE BOWEL WITH ASSISTANCE Description STG Manage Bowel with min Assistance.  Outcome: Progressing Goal: RH STG MANAGE BOWEL W/MEDICATION W/ASSISTANCE Description STG Manage Bowel with Medication with min Assistance.  Outcome: Progressing   Problem: RH SKIN INTEGRITY Goal: RH STG SKIN FREE OF INFECTION/BREAKDOWN Description Patients skin will remain free from further infection or breakdown with mod assist.   Outcome: Progressing Goal: RH STG MAINTAIN SKIN INTEGRITY WITH ASSISTANCE Description STG Maintain Skin Integrity With mod Assistance.  Outcome: Progressing   Problem: RH SAFETY Goal: RH STG ADHERE TO SAFETY PRECAUTIONS W/ASSISTANCE/DEVICE Description STG Adhere to Safety Precautions With min Assistance/Device.  Outcome: Progressing   Problem: RH COGNITION-NURSING Goal: RH STG USES MEMORY AIDS/STRATEGIES W/ASSIST TO PROBLEM SOLVE Description STG Uses Memory Aids/Strategies With min Assistance to Problem Solve.  Outcome: Progressing   Problem: RH KNOWLEDGE DEFICIT Goal: RH STG INCREASE KNOWLEDGE OF HYPERTENSION Description Min assist  Outcome: Progressing Goal: RH STG INCREASE KNOWLEDGE OF DYSPHAGIA/FLUID INTAKE Description Min assist  Outcome: Progressing Goal: RH STG INCREASE KNOWLEGDE OF HYPERLIPIDEMIA Description Min assist  Outcome: Progressing Goal: RH STG INCREASE KNOWLEDGE OF STROKE PROPHYLAXIS Description Min assist  Outcome: Progressing

## 2018-01-27 NOTE — Progress Notes (Addendum)
Social Work Patient ID: Sharen Hones, male   DOB: April 14, 1980, 37 y.o.   MRN: [id number] [place] with Mom via telephone to answer her questions. She does not feel they will need a ramp and they have no plans to do one. Asked about the resources available to pt, discussed once his medicaid has ben changed to full medicaid and not family planing once his disability is approve he would be eligible for PCS or CAP program which would allow for 3 hours per day of assist. Mom reports she will be with him for a short time but not an extended period. She will be gone on a planned vacation until 12/29 and plans to be here 12/30 and 12/31 for education. Discussed will need to get his two day extension approved by MD since his discharge date was set for 12/28. Also discussed her son will not be happy about this. She states: " He will not be happy anyway." Will discuss in team conference tomorrow. Mom was here Sat for PT but pt did not want her to go with him and she honored his wish. She will be back this Sat encouraged her to go to therapies with pt even if he doesn't want her too.

## 2018-01-27 NOTE — Progress Notes (Signed)
[place] PHYSICAL MEDICINE & REHABILITATION PROGRESS NOTE   Subjective/Complaints:  Pt again in bed. Offers little  ROS: Limited due to cognitive/behavioral    Objective:   No results found. Recent Labs    01/26/18 0530  WBC 11.3*  HGB 9.1*  HCT 28.4*  PLT 684*   Recent Labs    01/26/18 0530  NA 136  K 4.3  CL 100  CO2 25  GLUCOSE 106*  BUN 17  CREATININE 1.03  CALCIUM 9.9    Intake/Output Summary (Last 24 hours) at 01/27/2018 0915 Last data filed at 01/27/2018 0810 Gross per 24 hour  Intake 200 ml  Output -  Net 200 ml     Physical Exam: Vital Signs Blood pressure 116/89, pulse 84, temperature 98.3 F (36.8 C), temperature source Oral, resp. rate 18, height 5\' 11"  (1.803 m), weight 53.5 kg, SpO2 100 %.  Constitutional: No distress . Vital signs reviewed. HEENT: EOMI, oral membranes moist Neck: supple Cardiovascular: RRR without murmur. No JVD    Respiratory: CTA Bilaterally without wheezes or rales. Normal effort    GI: BS +, non-tender, non-distended  Skin: No evidence of breakdown, no evidence of rash Neurologic: Cranial nerves II through XII intact, motor strength is 4/5 in right deltoid, bicep, tricep, grip, hip flexor, knee extensors, ankle dorsiflexor and plantar flexor, 0/5 on left except 3+ at trap  Tone MAS 2/4 Left hamstrings and Left elbow flexors--motor exam stable Sensory exam: withdraws to pain stim Musculoskeletal: Full range of motion in all 4 extremities. No joint swelling    Assessment/Plan: 1. 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  Physiatrist is providing close team supervision and 24 hour management of active medical problems listed below.  Physiatrist and rehab team continue to assess barriers to discharge/monitor patient progress toward functional and  medical goals  Care Tool:  Bathing  Bathing activity did not occur: Refused Body parts bathed by patient: Chest, Abdomen, Front perineal area, Right upper leg, Left upper leg, Right lower leg, Face, Left arm   Body parts bathed by helper: Right arm, Left arm, Buttocks, Left lower leg, Right lower leg     Bathing assist Assist Level: Moderate Assistance - Patient 50 - 74%     Upper Body Dressing/Undressing Upper body dressing   What is the patient wearing?: Pull over shirt    Upper body assist Assist Level: Maximal Assistance - Patient 25 - 49%    Lower Body Dressing/Undressing Lower body dressing      What is the patient wearing?: Pants, Incontinence brief     Lower body assist Assist for lower body dressing: Maximal Assistance - Patient 25 - 49%     Toileting Toileting    Toileting assist Assist for toileting: Moderate Assistance - Patient 50 - 74%     Transfers Chair/bed transfer  Transfers assist  Chair/bed transfer activity did not occur: N/A  Chair/bed transfer assist level: Moderate Assistance - Patient 50 - 74%     Locomotion Ambulation   Ambulation assist   Ambulation activity did not occur: Safety/medical concerns  Assist level: Maximal Assistance - Patient 25 - 49% Assistive device: Walker-hemi Max distance: 15 ft   Walk 10 feet activity   Assist  Walk 10 feet activity did not occur: Safety/medical concerns  Assist level: Maximal Assistance - Patient 25 - 49% Assistive device: Walker-hemi  Walk 50 feet activity   Assist Walk 50 feet with 2 turns activity did not occur: Safety/medical concerns         Walk 150 feet activity   Assist Walk 150 feet activity did not occur: Safety/medical concerns         Walk 10 feet on uneven surface  activity   Assist Walk 10 feet on uneven surfaces activity did not occur: Safety/medical concerns         Wheelchair     Assist Will patient use wheelchair at discharge?:  Yes Type of Wheelchair: Manual Wheelchair activity did not occur: Safety/medical concerns  Wheelchair assist level: Supervision/Verbal cueing Max wheelchair distance: 40 ft    Wheelchair 50 feet with 2 turns activity    Assist    Wheelchair 50 feet with 2 turns activity did not occur: Safety/medical concerns   Assist Level: Supervision/Verbal cueing   Wheelchair 150 feet activity     Assist Wheelchair 150 feet activity did not occur: Safety/medical concerns        Medical Problem List and Plan: 1. 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 DVT Prophylaxis/Anticoagulation: Pharmaceutical:Lovenox instead of heparin, to reduce needle sticks and increase efficacy in post stroke population 3. Pain Management:tylenol prn 4. Mood:appears very disengaged, suspect significant depression  -increase ritalin to 10mg  bid  -add zoloft 25mg  q AM (as opposed to celexa QHS, given that he's already getting tizanidine and gabapentin at [facility])  5. Neuropsych: This patientis not fullycapable of making decisions on hisown behalf. 6. Skin/Wound Care:routine pressure relief measures. 7. Fluids/Electrolytes/Nutrition:Monitor I/O.   8. WKG:SUPJSRP BID. Continue metoprolol and catapres. Vitals:   01/26/18 1943 01/27/18 0516  BP: (!) 123/94 116/89  Pulse: (!) 104 84  Resp: 18 18  Temp: 97.7 F (36.5 C) 98.3 F (36.8 C)  SpO2: 95% 100%  Borderline control 12/17    9. Pancytopenia:Resolved Has resolved post Granix--d/c neutropenic precautions.Leukocytosis may be related to granix, also with thrombocytosis and anemia,appears to be trending down   -wbc's/ platelets trending down (11.3/684 on 12/16)  -hgb trending up 9.1  10. Prediabetes: Hgb A1c- 6.4. Monitor BS ac/hs and use SSI for elevated BS 11. Testicular cancer:Dr.  Magrinatrecommends no further chemo and monitoring AFP/HCG every 6 months X 2 years.  12. Hypertriglyceridemia:On lipitor. 13. Depression: Was on Zyprexa for mood stabilization.  Now on trazodone  (also helping with excess drooling) Neuropsych is following  -increased trazodone which has been beneficial for sleep 14.  LUE and LLE limb pain non focal , likely CVA related and a combination of increased tone and neurogenic pain  - increased tizanidine to 4mg  qhs  - gabapentin 100mg  qhs- which hasn't overly sedated him  -hasn't complained of pain this week to me.   LOS: 12 days A FACE TO FACE EVALUATION WAS PERFORMED  Meredith Staggers 01/27/2018, 9:15 AM

## 2018-01-27 NOTE — Progress Notes (Addendum)
Physical Therapy Session Note  Patient Details  Name: Caleb Hubbard MRN: [id number] Date of Birth: Feb 21, 1980  Today's Date: 01/27/2018 PT Individual Time: 8453-6468 PT Individual Time Calculation (min): 39 min   Short Term Goals: Week 2:  PT Short Term Goal 1 (Week 2): Pt will ambulate 30 ft with LRAD & mod assist.  PT Short Term Goal 2 (Week 2): Pt will complete bed<>w/c transfers with min assist.  PT Short Term Goal 3 (Week 2): Pt will complete bed mobility with CGA.  Skilled Therapeutic Interventions/Progress Updates:  Pt received in bed reporting the need to go to the bathroom. Pt transfers supine>sitting EOB with min assist with pt not fully participating in scooting to EOB. Pt transfers bed>w/c and w/c<>toilet with mod assist (used grab bars for toilet transfers) with cuing for safety due to pt's impulsivity with transfers. Pt able to maintain sitting balance on toilet with RUE support and supervision. Pt unable to void/have BM on toilet. Transported pt to gym via w/c dependent assist for time management. Pt completes sit>stand with min assist but max assist for stand>sit with max cuing but no return demo to square up to sitting surface & reach back with 1UE. Pt ambulates 20 ft + 20 ft with mod<>max assist with min cuing for placement of hemiwalker, blocking at L knee to prevent A/P instability and therapist providing manual facilitation for weight shifting L. Pt with improving ability to advance LLE today but requires significantly extra time to ambulate short distance (8 minutes to ambulate 20 ft). Once back in w/c pt stops responding and engaging with therapist, no longer following one step commands. Pt assisted back to room & bed in same manner noted above. Pt requires cuing to roll to center of bed but shakes his head no. Pt repositions in bed with max cuing. Pt left in bed with alarm set, call bell in reach.  Addendum: Pt c/o pain with therapist supporting pt's R hip during ambulation  therefore gait belt used but pt unhappy about this; gait belt loosened with rest breaks.   Therapy Documentation Precautions:  Precautions Precautions: Fall Precaution Comments: corretrack Restrictions Weight Bearing Restrictions: No    Therapy/Group: Individual Therapy  [name] 01/27/2018, 3:53 PM

## 2018-01-27 NOTE — Progress Notes (Signed)
Nutrition Follow-up  DOCUMENTATION CODES:   Non-severe (moderate) malnutrition in context of chronic illness, Underweight  INTERVENTION:   - Ensure Enlive po TID, each supplement provides 350 kcal and 20 grams of protein (vanilla flavor)  - Continue MVI with minerals daily  - Continue Pro-stat 30 ml BID, each supplement provides 100 kcal and 15 grams of protein  - d/c Boost Breeze  NUTRITION DIAGNOSIS:   Moderate Malnutrition related to chronic illness (brain stem stroke with resulting dysphagia) as evidenced by mild fat depletion, mild muscle depletion, moderate muscle depletion, percent weight loss (10.5% weight loss in less than 1 month).  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  Now s/p 1 of 2 planned cycles of cisplatin / etoposide (12/19/17 through 01/01/18 at [facility]). 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  Weight down 14 lbs since admission to CIR. Suspect weight loss related to poor PO intake and pt not being able to meet his nutritional needs. This is a 10.5% weight loss in less than 1 month which is severe and significant for timeframe.  Spoke with pt at bedside. RN in room providing nursing care at time of visit. Pt states that he does not like the Boost Breeze supplements but will drink Ensure Enlive (vanilla flavor). RD to order.  Pt states that he ate half of his breakfast. When asked why he is not eating more, pt states, "I'm tired of the same food everyday." Pt states that he had [employer] last night that family brought in. Pt states that he is tired of ice cream and just  wants "real food."  RD repeated NFPE. With weight loss and NFPE findings, pt now meets criteria for malnutrition.  Meal Completion: 0-75% x last 8 meals (extremely varied)  Medications reviewed and include: Boost Breeze TID, MVI with minerals daily, Protonix  Labs reviewed: hemoglobin 9.1 (L)  NUTRITION - FOCUSED PHYSICAL EXAM:    Most Recent Value  Orbital Region  No depletion  Upper Arm Region  Mild depletion  Thoracic and Lumbar Region  Mild depletion  Buccal Region  Mild depletion  Temple Region  Mild depletion  Clavicle Bone Region  No depletion  Clavicle and Acromion Bone Region  Moderate depletion  Scapular Bone Region  Mild depletion  Dorsal Hand  No depletion  Patellar Region  Mild depletion  Anterior Thigh Region  Mild depletion  Posterior Calf Region  Moderate depletion  Edema (RD Assessment)  None  Hair  Reviewed  Eyes  Reviewed  Mouth  Reviewed  Skin  Reviewed  Nails  Reviewed       Diet Order:   Diet Order            DIET DYS 2 Room service appropriate? Yes; Fluid consistency: Thin  Diet effective now              EDUCATION NEEDS:   Education needs have been addressed  Skin:  Skin Assessment: Reviewed RN Assessment  Last BM:  12/15  Height:   Ht Readings from Last 1 Encounters:  01/15/18 5\' 11"  (1.803 m)    Weight:  Wt Readings from Last 1 Encounters:  01/27/18 53.5 kg    Ideal Body Weight:  78.1 kg  BMI:  Body mass index is 16.45 kg/m.  Estimated Nutritional Needs:   Kcal:  2100-2300  Protein:  100-115 grams  Fluid:  2.1-2.3 L    Gaynell Face, MS, RD, LDN Inpatient Clinical Dietitian Pager: [phone] Weekend/After Hours: [phone]

## 2018-01-27 NOTE — Progress Notes (Signed)
Speech Language Pathology Daily Session Note  Patient Details  Name: Caleb Hubbard MRN: [id number] Date of Birth: 12/29/1980  Today's Date: 01/27/2018 SLP Individual Time: 5015-8682 SLP Individual Time Calculation (min): 48 min  Short Term Goals: Week 2: SLP Short Term Goal 1 (Week 2): Pt will utilize external memory aids to recall daily information with supervision cues.  SLP Short Term Goal 2 (Week 2): Pt will complex semi-complex problem solving tasks with Min A cues.  SLP Short Term Goal 3 (Week 2): Pt will demonstrate selective attention in mildly distracting environment for ~ 30 minutes with Min A cues.  SLP Short Term Goal 4 (Week 2): Pt will utilize speech intelligibility strategies to achieve ~ 90% speech intelligibility at the sentence level.  SLP Short Term Goal 5 (Week 2): Pt will consume current diet with minimal overt s/s of aspiration and supervision cues for use of compensatory strategies.   Skilled Therapeutic Interventions:Skilled ST services focused on cognitive skills. Pt was eating breakfast and agreeable to participating in therapy, however when asked how he was feeling today expressed " I want to go home."  Pt became very emotional and expressed he is ready to leave, he will work hard on therapy at home and he is not normally this emotional. SLP provided emotional support, provided education that the acute CVA could have impacted his emotional response and that his desires will be acknowledge during the weekly conference meeting and to focus on pushing himself in the mean time, pt was agreeable. Pt demonstrated recall of yesterday's activity and rules mod I. SLP facilitated semi-complex problem solving skills utilizing PEG design task, pt required min-mod A verbal cues, however visual deficits may have impacted preformace, pt was not wearing glasses, stating that staff lost them. Pt demonstrated selective attention in 30 minute intervals with mod A verbal cues. Pt demonstrated  70% intelligibility at sentence level, reduce intelligibility due to low vocal intensity and required max A fade to min A verbal cues to utilize verbal communication verse nonverbal. Pt was left in room with call bell within reach and bed alarm set. SLP reccomends to continue skilled services.     Pain Pain Assessment Pain Scale: 0-10 Pain Score: 0-No pain  Therapy/Group: Individual Therapy  Maryann Mccall  [facility] 01/27/2018, 2:41 PM

## 2018-01-28 ENCOUNTER — Inpatient Hospital Stay (HOSPITAL_COMMUNITY): Payer: Medicaid Other | Admitting: Physical Therapy

## 2018-01-28 ENCOUNTER — Inpatient Hospital Stay (HOSPITAL_COMMUNITY): Payer: Medicaid Other | Admitting: Occupational Therapy

## 2018-01-28 ENCOUNTER — Inpatient Hospital Stay (HOSPITAL_COMMUNITY): Payer: Medicaid Other

## 2018-01-28 LAB — CBC
HCT: 28 % — ABNORMAL LOW (ref 39.0–52.0)
Hemoglobin: 9.2 g/dL — ABNORMAL LOW (ref 13.0–17.0)
MCH: 32.7 pg (ref 26.0–34.0)
MCHC: 32.9 g/dL (ref 30.0–36.0)
MCV: 99.6 fL (ref 80.0–100.0)
Platelets: 614 10*3/uL — ABNORMAL HIGH (ref 150–400)
RBC: 2.81 MIL/uL — ABNORMAL LOW (ref 4.22–5.81)
RDW: 15.7 % — AB (ref 11.5–15.5)
WBC: 11.2 10*3/uL — ABNORMAL HIGH (ref 4.0–10.5)
nRBC: 0 % (ref 0.0–0.2)

## 2018-01-28 NOTE — Progress Notes (Signed)
[place] PHYSICAL MEDICINE & REHABILITATION PROGRESS NOTE   Subjective/Complaints:  Remains poorly interactive, discussed med changes with pt and Dr Naaman Plummer  ROS: Limited due to cognitive/behavioral    Objective:   No results found. Recent Labs    01/26/18 0530 01/28/18 0552  WBC 11.3* 11.2*  HGB 9.1* 9.2*  HCT 28.4* 28.0*  PLT 684* 614*   Recent Labs    01/26/18 0530  NA 136  K 4.3  CL 100  CO2 25  GLUCOSE 106*  BUN 17  CREATININE 1.03  CALCIUM 9.9    Intake/Output Summary (Last 24 hours) at 01/28/2018 0729 Last data filed at 01/27/2018 1722 Gross per 24 hour  Intake 250 ml  Output -  Net 250 ml     Physical Exam: Vital Signs Blood pressure (!) 127/94, pulse 90, temperature 98.2 F (36.8 C), temperature source Oral, resp. rate 17, height 5' 11" (1.803 m), weight 54.6 kg, SpO2 100 %.  Constitutional: No distress . Vital signs reviewed. HEENT: EOMI, oral membranes moist Neck: supple Cardiovascular: RRR without murmur. No JVD    Respiratory: CTA Bilaterally without wheezes or rales. Normal effort    GI: BS +, non-tender, non-distended  Skin: No evidence of breakdown, no evidence of rash Neurologic: Cranial nerves II through XII intact, motor strength is 4/5 in right deltoid, bicep, tricep, grip, hip flexor, knee extensors, ankle dorsiflexor and plantar flexor, 0/5 on left except 3+ at trap  Tone MAS 2/4 Left hamstrings and Left elbow flexors--motor exam stable Sensory exam: withdraws to pain stim Musculoskeletal: Full range of motion in all 4 extremities. No joint swelling    Assessment/Plan: 1. 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  Physiatrist is providing close team supervision and 24 hour management of active medical problems listed below.  Physiatrist and rehab team continue to  assess barriers to discharge/monitor patient progress toward functional and medical goals  Care Tool:  Bathing  Bathing activity did not occur: Refused Body parts bathed by patient: Chest, Abdomen, Front perineal area, Right upper leg, Left upper leg, Right lower leg, Face, Left arm   Body parts bathed by helper: Right arm, Left arm, Buttocks, Left lower leg, Right lower leg     Bathing assist Assist Level: Moderate Assistance - Patient 50 - 74%     Upper Body Dressing/Undressing Upper body dressing   What is the patient wearing?: Pull over shirt    Upper body assist Assist Level: Maximal Assistance - Patient 25 - 49%    Lower Body Dressing/Undressing Lower body dressing      What is the patient wearing?: Pants, Incontinence brief     Lower body assist Assist for lower body dressing: Maximal Assistance - Patient 25 - 49%     Toileting Toileting    Toileting assist Assist for toileting: Moderate Assistance - Patient 50 - 74%     Transfers Chair/bed transfer  Transfers assist  Chair/bed transfer activity did not occur: N/A  Chair/bed transfer assist level: Moderate Assistance - Patient 50 - 74%     Locomotion Ambulation   Ambulation assist   Ambulation activity did not occur: Safety/medical concerns  Assist level: Maximal Assistance - Patient 25 - 49% Assistive device: Walker-hemi Max distance: 20 ft    Walk 10 feet activity   Assist  Walk 10 feet activity did not occur: Safety/medical concerns  Assist  level: Maximal Assistance - Patient 25 - 49% Assistive device: Walker-hemi   Walk 50 feet activity   Assist Walk 50 feet with 2 turns activity did not occur: Safety/medical concerns         Walk 150 feet activity   Assist Walk 150 feet activity did not occur: Safety/medical concerns         Walk 10 feet on uneven surface  activity   Assist Walk 10 feet on uneven surfaces activity did not occur: Safety/medical concerns          Wheelchair     Assist Will patient use wheelchair at discharge?: Yes Type of Wheelchair: Manual Wheelchair activity did not occur: Safety/medical concerns  Wheelchair assist level: Supervision/Verbal cueing Max wheelchair distance: 40 ft    Wheelchair 50 feet with 2 turns activity    Assist    Wheelchair 50 feet with 2 turns activity did not occur: Safety/medical concerns   Assist Level: Supervision/Verbal cueing   Wheelchair 150 feet activity     Assist Wheelchair 150 feet activity did not occur: Safety/medical concerns        Medical Problem List and Plan: 1. 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 Formulized individual treatment plan based on medical history, underlying problem and comorbidities.   -Continue CIR therapies including PT, OT, and SLP  2. DVT Prophylaxis/Anticoagulation: Pharmaceutical:Lovenox instead of heparin, to reduce needle sticks and increase efficacy in post stroke population 3. Pain Management:tylenol prn 4. Mood:appears very disengaged, suspect significant depression  -increase ritalin to 32m bid  -add zoloft 212mq AM (as opposed to celexa QHS, given that he's already getting tizanidine and gabapentin at HS)  5. Neuropsych: This patientis not fullycapable of making decisions on hisown behalf. 6. Skin/Wound Care:routine pressure relief measures. 7. Fluids/Electrolytes/Nutrition:Monitor I/O.   8. HTKJZ:PHXTAVWID. Continue metoprolol and catapres. Vitals:   01/27/18 2013 01/28/18 0538  BP: (!) 142/89 (!) 127/94  Pulse: 79 90  Resp: 18 17  Temp: 98.9 F (37.2 C) 98.2 F (36.8 C)  SpO2: 100% 100%  Borderline control 12/18    9. Pancytopenia:Resolved Has resolved post Granix--d/c neutropenic  precautions.Leukocytosis may be related to granix, also with thrombocytosis and anemia,appears to be trending down   -wbc's/ platelets trending down (11.3/684 on 12/16)  -hgb trending up 9.1  10. Prediabetes: Hgb A1c- 6.4. Monitor BS ac/hs and use SSI for elevated BS 11. Testicular cancer:Dr. Magrinatrecommends no further chemo and monitoring AFP/HCG every 6 months X 2 years.  12. Hypertriglyceridemia:On lipitor. 13. Depression: Was on Zyprexa for mood stabilization.   Neuropsych is following  -increased trazodone which has been beneficial for sleep. Sertraline, ritalin 14.  LUE and LLE limb pain non focal , likely CVA related and a combination of increased tone and neurogenic pain  - increased tizanidine to 72m35mhs  - gabapentin 100m53ms- which hasn't overly sedated him    LOS: 13 days A FACE TO FACE EVALUATION WAS PERFORMED  AndrCharlett Blake18/2019, 7:29 AM

## 2018-01-28 NOTE — Progress Notes (Signed)
Speech Language Pathology Daily Session Note  Patient Details  Name: Caleb Hubbard MRN: [id number] Date of Birth: 11/09/1980  Today's Date: 02/02/2018 SLP Individual Time:  - 1330- 1400    Short Term Goals: Week 2: SLP Short Term Goal 1 (Week 2): Pt will utilize external memory aids to recall daily information with supervision cues.  SLP Short Term Goal 1 - Progress (Week 2): Not met SLP Short Term Goal 2 (Week 2): Pt will complex semi-complex problem solving tasks with Min A cues.  SLP Short Term Goal 2 - Progress (Week 2): Not met SLP Short Term Goal 3 (Week 2): Pt will demonstrate selective attention in mildly distracting environment for ~ 30 minutes with Min A cues.  SLP Short Term Goal 3 - Progress (Week 2): Not met SLP Short Term Goal 4 (Week 2): Pt will utilize speech intelligibility strategies to achieve ~ 90% speech intelligibility at the sentence level.  SLP Short Term Goal 4 - Progress (Week 2): Not met SLP Short Term Goal 5 (Week 2): Pt will consume current diet with minimal overt s/s of aspiration and supervision cues for use of compensatory strategies.  SLP Short Term Goal 5 - Progress (Week 2): Progressing toward goal  Skilled Therapeutic Interventions:Skilled ST services focused on swallow and cognitive skills. Pt's girlfriend was present in the beginning of session, SLP provided education about discharge date limited physical progress likely due to reduced effort, pt agreed to participate to his fullest in his last week of inpatient therapy. SLP facilitated semi-complex problem solving and error awareness/correction skills utilizing ALFA money and medication management, pt required min A verbal cues of medication management, however required mod A verbal cues for basic to semi-complex money management ( creating change) and min A verbal cues for recall. Pt required max a verbal cues to utilize strategies to reduce errors in problem solving task. SLP facilitated use of speech  intelligibility strategies, however pt continues to require max-mod A verbal cues to utilize verbal compared to nonverbal communication and 70% intelligibility. Pt requested urinal and SLP assisted.  SLP facilitated PO consumption of dys 3 trial, pt demonstrate appropriate oral clearance and no overt s/s aspiration, SLP recommends trial tray in future sessions to assess tolerance. Pt was left in room with call bell within reach and bed alarm set. SLP reccomends to continue skilled services.     Pain Pain Assessment Pain Scale: Faces Pain Score: 0-No pain  Therapy/Group: Individual Therapy  Juanna Pudlo  [facility] 02/02/2018, 5:51 PM

## 2018-01-28 NOTE — Progress Notes (Signed)
Physical Therapy Note  Patient Details  Name: Caleb Hubbard MRN: [id number] Date of Birth: 10-24-1980 Today's Date: 01/28/2018    Pt's plan of care adjusted to QD after speaking with care team and discussed with MD in team conference as pt currently unable to tolerate, and not participating, in current therapy schedule with OT, PT, and SLP.     [name] 01/28/2018, 12:34 PM

## 2018-01-28 NOTE — Progress Notes (Signed)
Physical Therapy Session Note  Patient Details  Name: Caleb Hubbard MRN: [id number] Date of Birth: 11-04-1980  Today's Date: 01/28/2018 PT Individual Time: 7322-0254 PT Individual Time Calculation (min): 28 min   Short Term Goals: Week 2:  PT Short Term Goal 1 (Week 2): Pt will ambulate 30 ft with LRAD & mod assist.  PT Short Term Goal 2 (Week 2): Pt will complete bed<>w/c transfers with min assist.  PT Short Term Goal 3 (Week 2): Pt will complete bed mobility with CGA.  Skilled Therapeutic Interventions/Progress Updates:  Pt received asleep in bed but awakened with verbal stimulation. Pt transferred to EOB with min assist with no active effort observed to move LLE to EOB; pt requires significantly extra time to scoot to EOB with max cuing. Pt completes stand pivot transfers with min assist. Transported pt to gym via w/c dependent assist for time management. Pt completes sit>stand with supervision and cuing for hand placement but max cuing for safety for stand>sit with pt not following safety commands. Pt ambulates 22 ft with hemiwalker & mod assist for balance and upright posture with pt advancing LLE 75% of the time with therapist providing blocking at L knee to prevent anterior instability but this only observed 25% of the time. Pt continues to require cuing for placement of hemiwalker. Pt then reports need to use restroom so returned to room and transferred w/c<>toilet with min assist & grab bar. Pt maintained sitting balance with RUE support and distant supervision without LOB. Pt reports BM but did not have BM nor void even with significantly extra time spent on toilet. Therapist managed clothing dependent assist and pt returned to w/c. Pt then begins propelling w/c towards bed and request to return to bed. Pt states "my butt hurts, that was hard" but does not elaborate. Pt transferred to bed in same manner as noted above. Pt requires min assist for LLE for sit>supine. Pt left in bed with alarm  set & needs in reach.   Therapy Documentation Precautions:  Precautions Precautions: Fall Precaution Comments: corretrack Restrictions Weight Bearing Restrictions: No   General: PT Amount of Missed Time (min): 17 Minutes PT Missed Treatment Reason: Patient unwilling to participate    Therapy/Group: Individual Therapy  [name] 01/28/2018, 10:28 AM

## 2018-01-28 NOTE — Progress Notes (Signed)
Occupational Therapy Note  Patient Details  Name: Caleb Hubbard MRN: [id number] Date of Birth: 05/18/1980  Today's Date: 01/28/2018 OT Missed Time: 33 Minutes Missed Time Reason: Patient unwilling/refused to participate without medical reason;Patient fatigue  Pt missed 45 mins scheduled OT treatment due to refusal. Pt asleep in bed and would not awake to therapist tactile or verbal cues.  Attempted to encourage pt to engage in any functional, motivating task, however pt continued to not respond to therapist.  Will continue to follow as able.   Simonne Come 01/28/2018, 12:13 PM

## 2018-01-28 NOTE — Patient Care Conference (Signed)
Inpatient RehabilitationTeam Conference and Plan of Care Update Date: 01/28/2018   Time: 10:40 AM    Patient Name: Caleb Hubbard      Medical Record Number: [id number]  Date of Birth: January 07, 1981 Sex: Male         Room/Bed: 4W14C/4W14C-01 Payor Info: Payor: MEDICAID PENDING / Plan: MEDICAID PENDING / Product Type: *No Product type* /    Admitting Diagnosis: CVA  Admit Date/Time:  01/15/2018  3:26 PM Admission Comments: No comment available   Primary Diagnosis:  <principal problem not specified> Principal Problem: <principal problem not specified>  Patient Active Problem List   Diagnosis Date Noted  . Reactive depression   . 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 Cancer ([place])   . Endotracheally intubated   . Stroke (cerebrum) ([place]) 01/03/2018  . 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 Alysia Penna Social Worker Present: Ovidio Kin, LCSW Nurse Present: Ellison Carwin, LPN PT Present: Lavone Nian, PT OT Present: Simonne Come, OT SLP Present: Charolett Bumpers, SLP PPS Coordinator present : Gunnar Fusi     Current Status/Progress Goal Weekly Team Focus  Medical   Poor motivation, severe depression  Improve mood, increased motivation  Medication change for depression   Bowel/Bladder   incontinent x2 LBM 12/15  Few episodes of incontinence, offer assistance with toileting q2h   Assess Bowel and bladder need qshift and PRN   Swallow/Nutrition/ Hydration   Dys 2 and thin, Min A  Mod A  Dys 3 trials    ADL's   min-max assist with self-care tasks as pt participation fluctuates, min-mod assist stand pivot transfers.  Depressed mood and decreased motivation impacting participation  Contact guard - Min assist  ADL retraining, transfers, increased participation, LUE NMR, pt/family education   Mobility   mod<>max assist gait x 15 ft with hemiwalker, some LLE  activation, min assist supine>sit, mod assist stand pivot, supervision w/c mobility, very poor willingness to participate  min assist gait, supervision w/c mobility, min assist stairs  L NMR, transfers, bed mobility, sitting/standing balance, gait, w/c mobility, pt education   Communication   sentence level 70% intelligibility, mod A  Mod A - goal met   vocal intensity, speech intelligibility strategies    Safety/Cognition/ Behavioral Observations  Mod- Min A  Mod A - goal met   semi-complex, problem solving, emergernt, recall, selective   Pain   No c/o pain   Pt to remain pain free  assess pain qshift and PRN   Skin   No skin impairments  remain free of skin impairments. turn and reposition Pt q2  assess skin qshift and PRN      *See Care Plan and progress notes for long and short-term goals.     Barriers to Discharge  Current Status/Progress Possible Resolutions Date Resolved   Physician    Medical stability;Behavior     Poor progress limited participation, mother is vacationing during discharge date  We will change therapies to daily, neuropsych re-eval      Nursing                  PT  Home environment access/layout;Lack of/limited family support  unsure of definite d/c plan & ability for family to provide necessary hands on assist at d/c              OT  SLP                SW                Discharge Planning/Teaching Needs:  Mom was here on Sat but allowed pt to not let her go to therapies. Mom on a trip unitl 12/29, wants DC 12/31 to be able to do fmaily educaiton prior to DC home      Team Discussion:  Trials of Dys 3 thin. Participation an issue, will try male therapists to see if difference. Left leg starting to work and trace in shoulder. Few incontinent episodes. Speech intelligibility better. Could make more progress if participated and went to therapies daily. Severely depressed and MD addressing if psychiatry should see.  Revisions to Treatment  Plan:  DC possible extend due to caregiver situtation    Continued Need for Acute Rehabilitation Level of Care: The patient requires daily medical management by a physician with specialized training in physical medicine and rehabilitation for the following conditions: Daily direction of a multidisciplinary physical rehabilitation program to ensure safe treatment while eliciting the highest outcome that is of practical value to the patient.: Yes Daily medical management of patient stability for increased activity during participation in an intensive rehabilitation regime.: Yes Daily analysis of laboratory values and/or radiology reports with any subsequent need for medication adjustment of medical intervention for : Neurological problems;Mood/behavior problems   I attest that I was present, lead the team conference, and concur with the assessment and plan of the team.   Elease Hashimoto 01/28/2018, 12:43 PM

## 2018-01-28 NOTE — Plan of Care (Signed)
  Problem: Spiritual Needs Goal: Ability to function at adequate level Outcome: Progressing   Problem: Consults Goal: RH STROKE PATIENT EDUCATION Description See Patient Education module for education specifics  Outcome: Progressing Goal: Nutrition Consult-if indicated Outcome: Progressing   Problem: RH BOWEL ELIMINATION Goal: RH STG MANAGE BOWEL WITH ASSISTANCE Description STG Manage Bowel with min Assistance.  Outcome: Progressing Goal: RH STG MANAGE BOWEL W/MEDICATION W/ASSISTANCE Description STG Manage Bowel with Medication with min Assistance.  Outcome: Progressing   Problem: RH SKIN INTEGRITY Goal: RH STG SKIN FREE OF INFECTION/BREAKDOWN Description Patients skin will remain free from further infection or breakdown with mod assist.   Outcome: Progressing Goal: RH STG MAINTAIN SKIN INTEGRITY WITH ASSISTANCE Description STG Maintain Skin Integrity With mod Assistance.  Outcome: Progressing   Problem: RH SAFETY Goal: RH STG ADHERE TO SAFETY PRECAUTIONS W/ASSISTANCE/DEVICE Description STG Adhere to Safety Precautions With min Assistance/Device.  Outcome: Progressing   Problem: RH COGNITION-NURSING Goal: RH STG USES MEMORY AIDS/STRATEGIES W/ASSIST TO PROBLEM SOLVE Description STG Uses Memory Aids/Strategies With min Assistance to Problem Solve.  Outcome: Progressing   Problem: RH KNOWLEDGE DEFICIT Goal: RH STG INCREASE KNOWLEDGE OF HYPERTENSION Description Min assist  Outcome: Progressing Goal: RH STG INCREASE KNOWLEDGE OF DYSPHAGIA/FLUID INTAKE Description Min assist  Outcome: Progressing Goal: RH STG INCREASE KNOWLEGDE OF HYPERLIPIDEMIA Description Min assist  Outcome: Progressing Goal: RH STG INCREASE KNOWLEDGE OF STROKE PROPHYLAXIS Description Min assist  Outcome: Progressing

## 2018-01-29 ENCOUNTER — Inpatient Hospital Stay (HOSPITAL_COMMUNITY): Payer: Medicaid Other | Admitting: Occupational Therapy

## 2018-01-29 ENCOUNTER — Inpatient Hospital Stay (HOSPITAL_COMMUNITY): Payer: Medicaid Other

## 2018-01-29 ENCOUNTER — Inpatient Hospital Stay (HOSPITAL_COMMUNITY): Payer: Medicaid Other | Admitting: Speech Pathology

## 2018-01-29 NOTE — Progress Notes (Signed)
Physical Therapy Session Note  Patient Details  Name: Caleb Hubbard MRN: [id number] Date of Birth: 05/07/1980  Today's Date: 01/29/2018 PT Individual Time: 0800-0850 PT Individual Time Calculation (min): 50 min   Short Term Goals:  Week 2:  PT Short Term Goal 1 (Week 2): Pt will ambulate 30 ft with LRAD & mod assist.  PT Short Term Goal 2 (Week 2): Pt will complete bed<>w/c transfers with min assist.  PT Short Term Goal 3 (Week 2): Pt will complete bed mobility with CGA.  Skilled Therapeutic Interventions/Progress Updates:   Pt resting in bed, willing to participate in tx.  Bed mobility to come to sitting on R side of bed, using compensatory technique of moving LLE using RLE, after set up.  Pt sat up with CGA.  Squat pivot tranfser bed> w/c to L with mod assist.  neuromuscular re-education via forced use, multimodal cues for alternating reciprocal movement x bil LEs as well as unilateral movement LLE, using KInetron at level 40-50 cm/sec.  With repetition, pt was able to extend L knee slightly to depress foot plate.  Attempted gait using hallway railing, but pt stated that he needed to use toilet for BM.  Toilet transfer using wall raling with mod assist.  With min guard assist, pt able to manage clothing with cues.  No results on toilet.  Gait training using wall railing on R, x 25' forward with mod assist, x 10' backwards with mod/max assist.  Pt wore shoe on R foot which improved clearance of LLE.  Pt advanced LLE without assistance 90% of time; assistance provided for L knee stability and upright trunk; max cues for waiting for PT to stabilize L knee before stepping with RLE.  Pt left resting in bed in R side lying with pillow between knees and LUE supported on pillow.  Marjorie Smolder, RN in room giving meds at end of session.  PT asked her to set bed alarm and give pt call bell.      Therapy Documentation Precautions:  Precautions Precautions: Fall Precaution Comments:  corretrack Restrictions Weight Bearing Restrictions: No Pain: vague c/o L sided pain, not rated.  Emotional support provided      Therapy/Group: Individual Therapy  Waino Mounsey 01/29/2018, 10:23 AM

## 2018-01-29 NOTE — Progress Notes (Signed)
Occupational Therapy Session Note  Patient Details  Name: Caleb Hubbard MRN: [id number] Date of Birth: Jul 20, 1980  Today's Date: 01/29/2018 OT Individual Time: 3300-7622 OT Individual Time Calculation (min): 30 min  and Today's Date: 01/29/2018 OT Missed Time: 15 Minutes Missed Time Reason: Nursing care   Short Term Goals: Week 2:  OT Short Term Goal 1 (Week 2): Pt will don shirt with min A OT Short Term Goal 2 (Week 2): Pt will manage LUE prior to transfers with min VC OT Short Term Goal 3 (Week 2): Pt will recall hemi dressing techniques with min VC OT Short Term Goal 4 (Week 2): Pt will thread BLE into pants with min A  Skilled Therapeutic Interventions/Progress Updates:    Treatment session with focus on functional mobility and LUE NMR.  Pt missed initial 15 mins due to nursing care.  Pt agreeable to working on LUE.  Completed stand pivot transfers min assist with cues for increased initiation and participation during mobility.  Engaged in towel glides with focus on shoulder flexion/extension with pt able to elicit with increased time and tactile cues at elbow to decrease gravity.  Pt reports need to toilet.  Returned to room and completed toilet transfers with min assist and pt completed sit > stand with supervision.  Therapist assisted with hygiene and clothing management.  Pt completed stand pivot transfer back to bed with CGA.  Pt left in sidelying with all needs in reach.  Therapy Documentation Precautions:  Precautions Precautions: Fall Precaution Comments: corretrack Restrictions Weight Bearing Restrictions: No General: General OT Amount of Missed Time: 15 Minutes Vital Signs: Therapy Vitals Temp: 98.6 F (37 C) Temp Source: Oral Pulse Rate: 93 Resp: 16 BP: 110/86 Patient Position (if appropriate): Lying Oxygen Therapy SpO2: 100 % O2 Device: Room Air Pain: Pain Assessment Pain Scale: 0-10 Pain Score: 0-No pain   Therapy/Group: Individual Therapy  Simonne Come 01/29/2018, 4:20 PM

## 2018-01-29 NOTE — Progress Notes (Signed)
[place] PHYSICAL MEDICINE & REHABILITATION PROGRESS NOTE   Subjective/Complaints:  Poor participation noted by care team in conf, no sig changes in initiation with Ritalin We discussed that this setting is the most intensive program available and that rehab therapies are the primary treatment for stroke recovery  ROS: Limited due to cognitive/behavioral    Objective:   No results found. Recent Labs    01/28/18 0552  WBC 11.2*  HGB 9.2*  HCT 28.0*  PLT 614*   No results for input(s): NA, K, CL, CO2, GLUCOSE, BUN, CREATININE, CALCIUM in the last 72 hours.  Intake/Output Summary (Last 24 hours) at 01/29/2018 0713 Last data filed at 01/28/2018 1747 Gross per 24 hour  Intake 720 ml  Output -  Net 720 ml     Physical Exam: Vital Signs Blood pressure (!) 123/95, pulse 82, temperature 98.5 F (36.9 C), temperature source Oral, resp. rate 17, height 5\' 11"  (1.803 m), weight 54.4 kg, SpO2 100 %.  Constitutional: No distress . Vital signs reviewed. HEENT: EOMI, oral membranes moist Neck: supple Cardiovascular: RRR without murmur. No JVD    Respiratory: CTA Bilaterally without wheezes or rales. Normal effort    GI: BS +, non-tender, non-distended  Skin: No evidence of breakdown, no evidence of rash Neurologic: Cranial nerves II through XII intact, motor strength is 4/5 in right deltoid, bicep, tricep, grip, hip flexor, knee extensors, ankle dorsiflexor and plantar flexor, 0/5 on left except 3+ at trap  Tone MAS 2/4 Left hamstrings and Left elbow flexors--motor exam stable Sensory exam: withdraws to pain stim Musculoskeletal: Full range of motion in all 4 extremities. No joint swelling    Assessment/Plan: 1. 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  Physiatrist is providing close team supervision and 24 hour  management of active medical problems listed below.  Physiatrist and rehab team continue to assess barriers to discharge/monitor patient progress toward functional and medical goals  Care Tool:  Bathing  Bathing activity did not occur: Refused Body parts bathed by patient: Chest, Abdomen, Front perineal area, Right upper leg, Left upper leg, Right lower leg, Face, Left arm   Body parts bathed by helper: Right arm, Left arm, Buttocks, Left lower leg, Right lower leg     Bathing assist Assist Level: Moderate Assistance - Patient 50 - 74%     Upper Body Dressing/Undressing Upper body dressing   What is the patient wearing?: Pull over shirt    Upper body assist Assist Level: Maximal Assistance - Patient 25 - 49%    Lower Body Dressing/Undressing Lower body dressing      What is the patient wearing?: Pants, Incontinence brief     Lower body assist Assist for lower body dressing: Maximal Assistance - Patient 25 - 49%     Toileting Toileting    Toileting assist Assist for toileting: Contact Guard/Touching assist(urinal)     Transfers Chair/bed transfer  Transfers assist  Chair/bed transfer activity did not occur: N/A  Chair/bed transfer assist level: Minimal Assistance - Patient > 75%     Locomotion Ambulation   Ambulation assist   Ambulation activity did not occur: Safety/medical concerns  Assist level: Moderate Assistance - Patient 50 - 74% Assistive device: Walker-hemi Max distance: 22 ft    Walk 10 feet activity   Assist  Walk 10 feet activity did not occur: Safety/medical concerns  Assist level:  Moderate Assistance - Patient - 50 - 74% Assistive device: Walker-hemi   Walk 50 feet activity   Assist Walk 50 feet with 2 turns activity did not occur: Safety/medical concerns         Walk 150 feet activity   Assist Walk 150 feet activity did not occur: Safety/medical concerns         Walk 10 feet on uneven surface  activity   Assist  Walk 10 feet on uneven surfaces activity did not occur: Safety/medical concerns         Wheelchair     Assist Will patient use wheelchair at discharge?: Yes Type of Wheelchair: Manual Wheelchair activity did not occur: Safety/medical concerns  Wheelchair assist level: Supervision/Verbal cueing Max wheelchair distance: 40 ft    Wheelchair 50 feet with 2 turns activity    Assist    Wheelchair 50 feet with 2 turns activity did not occur: Safety/medical concerns   Assist Level: Supervision/Verbal cueing   Wheelchair 150 feet activity     Assist Wheelchair 150 feet activity did not occur: Safety/medical concerns        Medical Problem List and Plan: 1. 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 DVT Prophylaxis/Anticoagulation: Pharmaceutical:Lovenox instead of heparin, to reduce needle sticks and increase efficacy in post stroke population 3. Pain Management:tylenol prn 4. Mood:appears very disengaged, suspect significant depression  -increase ritalin to 10mg  bid  -add zoloft 25mg  q AM (as opposed to celexa QHS, given that he's already getting tizanidine and gabapentin at [facility])  5. Neuropsych: This patientis not fullycapable of making decisions on hisown behalf. 6. Skin/Wound Care:routine pressure relief measures. 7. Fluids/Electrolytes/Nutrition:Monitor I/O.   8. WFU:XNATFTD BID. Continue metoprolol and catapres. Vitals:   01/28/18 2010 01/29/18 0342  BP: (!) 125/99 (!) 123/95  Pulse: (!) 117 82  Resp: 18 17  Temp: 98.8 F (37.1 C) 98.5 F (36.9 C)  SpO2:  100%  Borderline control 12/19    9. Pancytopenia:Resolved Has resolved post Granix--d/c neutropenic precautions.Leukocytosis may be related to granix, also with thrombocytosis and anemia,appears to be trending  down   -wbc's/ platelets trending down (11.3/684 on 12/16)  -hgb trending up 9.1  10. Prediabetes: Hgb A1c- 6.4. Monitor BS ac/hs and use SSI for elevated BS 11. Testicular cancer:Dr. Magrinatrecommends no further chemo and monitoring AFP/HCG every 6 months X 2 years.  12. Hypertriglyceridemia:On lipitor. 13. Depression: Was on Zyprexa for mood stabilization.   Neuropsych is following  -increased trazodone which has been beneficial for sleep. Sertraline,D/C ritalin 14.  LUE and LLE limb pain non focal , likely CVA related and a combination of increased tone and neurogenic pain  - increased tizanidine to 4mg  qhs  - gabapentin 100mg  qhs- which hasn't overly sedated him    LOS: 14 days A FACE TO FACE EVALUATION WAS PERFORMED  [name] 01/29/2018, 7:13 AM

## 2018-01-29 NOTE — Progress Notes (Signed)
Speech Language Pathology Weekly Progress and Session Note  Patient Details  Name: Caleb Hubbard MRN: [id number] Date of Birth: 10-Oct-1980  Beginning of progress report period: January 23, 2018 End of progress report period: January 29, 2018  Today's Date: 01/29/2018 SLP Individual Time: 1000-1030 SLP Individual Time Calculation (min): 30 min  Short Term Goals: Week 2: SLP Short Term Goal 1 (Week 2): Pt will utilize external memory aids to recall daily information with supervision cues.  SLP Short Term Goal 1 - Progress (Week 2): Not met SLP Short Term Goal 2 (Week 2): Pt will complex semi-complex problem solving tasks with Min A cues.  SLP Short Term Goal 2 - Progress (Week 2): Not met SLP Short Term Goal 3 (Week 2): Pt will demonstrate selective attention in mildly distracting environment for ~ 30 minutes with Min A cues.  SLP Short Term Goal 3 - Progress (Week 2): Not met SLP Short Term Goal 4 (Week 2): Pt will utilize speech intelligibility strategies to achieve ~ 90% speech intelligibility at the sentence level.  SLP Short Term Goal 4 - Progress (Week 2): Not met SLP Short Term Goal 5 (Week 2): Pt will consume current diet with minimal overt s/s of aspiration and supervision cues for use of compensatory strategies.  SLP Short Term Goal 5 - Progress (Week 2): Progressing toward goal    New Short Term Goals: Week 3: SLP Short Term Goal 1 (Week 3): Pt will utilize external memory aids to recall daily information with supervision cues.  SLP Short Term Goal 2 (Week 3): Pt will complex semi-complex problem solving tasks with Min A cues.  SLP Short Term Goal 3 (Week 3): Pt will demonstrate selective attention in mildly distracting environment for ~ 30 minutes with Min A cues.  SLP Short Term Goal 4 (Week 3): Pt will utilize speech intelligibility strategies to achieve ~ 90% speech intelligibility at the sentence level.  SLP Short Term Goal 5 (Week 3): Pt will consume current diet with  minimal overt s/s of aspiration and supervision cues for use of compensatory strategies.   Weekly Progress Updates: Pt has made minimal progress this reporting period d/t decreased overall participation in skilled sessions. Pt currently requires Max A to Mod A for all tasks. Pt appears severely depressed and unknown information about any past psychiatric history. Even when pt is seen with family in coffee shop, pt presents with complete disinterest in interacting with family or SLP. As such it is difficult to determine pt's true cognitive function.      Intensity: Minumum of 1-2 x/day, 30 to 90 minutes Frequency: 3 to 5 out of 7 days Duration/Length of Stay: 12/30 Treatment/Interventions: Cognitive remediation/compensation;Dysphagia/aspiration precaution training;Internal/external aids;Speech/Language facilitation;Medication managment;Therapeutic Activities;Functional tasks;Patient/family education   Daily Session  Skilled Therapeutic Interventions: Skilled treatment session focused on cognition goals. SLP received pt in bed with disinterest in therapy. Pt rolled over turning his back towards SLP. SLP provided emotional support but pt continues to state "it's no use - no get better." Education provided on increased ability when participating in skilled ST. Pt continues to demonstrate decreased participation.     General    Pain Pain Assessment Pain Scale: 0-10 Pain Score: 0-No pain  Therapy/Group: Individual Therapy  Koji Niehoff 01/29/2018, 4:07 PM

## 2018-01-29 NOTE — Progress Notes (Signed)
Social Work Patient ID: Sharen Hones, male   DOB: Feb 18, 1980, 37 y.o.   MRN: [id number] Met with pt and Mom who is visiting to discuss team conference progress and need to participate in therapies. If he participated therapy team feels he could reach supervision level goals otherwise he probably will require min assist level of care. MD did talk with pt this am on his rounds, this is his opportunity to do well and progress from his stroke. MD agreeable for pt to discharge 12/31, Mom reports want to do family education and discharge on the same day due to the drive involved. Will plan to do family education on 12/30 from 9:00-12:00 then discharge in the afternoon. Pt is agreeable to this. Mom wants all appointments made prior to him leaving otherwise she will not take him home, she states. Mom wanting to talk with Tracy-financial counselor have e-mailed her to call Mom. Mom to take FMLA but have not received the forms from her work yet. Will work on discharge needs and continue to encourage pt to participate in therapies.

## 2018-01-30 ENCOUNTER — Inpatient Hospital Stay (HOSPITAL_COMMUNITY): Payer: Medicaid Other | Admitting: Physical Therapy

## 2018-01-30 ENCOUNTER — Inpatient Hospital Stay (HOSPITAL_COMMUNITY): Payer: Medicaid Other | Admitting: Occupational Therapy

## 2018-01-30 ENCOUNTER — Inpatient Hospital Stay (HOSPITAL_COMMUNITY): Payer: Medicaid Other | Admitting: Speech Pathology

## 2018-01-30 LAB — CBC
HCT: 29.2 % — ABNORMAL LOW (ref 39.0–52.0)
Hemoglobin: 9.4 g/dL — ABNORMAL LOW (ref 13.0–17.0)
MCH: 31.6 pg (ref 26.0–34.0)
MCHC: 32.2 g/dL (ref 30.0–36.0)
MCV: 98.3 fL (ref 80.0–100.0)
Platelets: 524 10*3/uL — ABNORMAL HIGH (ref 150–400)
RBC: 2.97 MIL/uL — AB (ref 4.22–5.81)
RDW: 15.6 % — ABNORMAL HIGH (ref 11.5–15.5)
WBC: 13.6 10*3/uL — ABNORMAL HIGH (ref 4.0–10.5)
nRBC: 0 % (ref 0.0–0.2)

## 2018-01-30 NOTE — Progress Notes (Signed)
Physical Therapy Weekly Progress Note  Patient Details  Name: Caleb Hubbard MRN: [id number] Date of Birth: 02/25/1980  Beginning of progress report period: January 23, 2018 End of progress report period: January 30, 2018  Today's Date: 01/30/2018  Patient has met 0 of 3 short term goals.  Pt is making slow progress towards LTG's. Pt can ambulate 25 ft with hemiwalker and mod assist and has returning activation in LLE. Pt continues to demonstrate significantly decreased willingness & motivation to participate. Have tried to schedule pt with male therapist's to see if pt's engagement and willingness will increase. Pt would benefit from continued skilled PT treatment to focus on bed mobility, transfers, gait, and initiate stairs as able or else pt may need ramp installed at his home. Pt's family will also benefit from hands on training and education prior to d/c to ensure safety for pt & caregivers upon return home.  Patient continues to demonstrate the following deficits muscle weakness, decreased cardiorespiratoy endurance, decreased coordination, decreased initiation, decreased attention, decreased awareness, decreased problem solving, decreased safety awareness, decreased memory and delayed processing, and decreased standing balance, decreased postural control, hemiplegia and decreased balance strategies and therefore will continue to benefit from skilled PT intervention to increase functional independence with mobility.  Patient progressing toward long term goals..  Continue plan of care.  PT Short Term Goals Week 2:  PT Short Term Goal 1 (Week 2): Pt will ambulate 30 ft with LRAD & mod assist.  PT Short Term Goal 1 - Progress (Week 2): Progressing toward goal PT Short Term Goal 2 (Week 2): Pt will complete bed<>w/c transfers with min assist.  PT Short Term Goal 2 - Progress (Week 2): Progressing toward goal PT Short Term Goal 3 (Week 2): Pt will complete bed mobility with CGA. PT Short Term  Goal 3 - Progress (Week 2): Not met Week 3:  PT Short Term Goal 1 (Week 3): Pt will complete bed mobility with CGA.  PT Short Term Goal 2 (Week 3): Pt will ambulate 50 ft with LRAD & mod assist.  PT Short Term Goal 3 (Week 3): Pt will initiate stair training.     Therapy Documentation Precautions:  Precautions Precautions: Fall Precaution Comments: corretrack Restrictions Weight Bearing Restrictions: No   Therapy/Group: Individual Therapy  [name] 01/30/2018, 12:10 PM

## 2018-01-30 NOTE — Progress Notes (Signed)
Occupational Therapy Session Note  Patient Details  Name: Caleb Hubbard MRN: [id number] Date of Birth: 04-19-1980  Today's Date: 01/30/2018 OT Individual Time: 1735-6701 OT Individual Time Calculation (min): 44 min    Short Term Goals: Week 2:  OT Short Term Goal 1 (Week 2): Pt will don shirt with min A OT Short Term Goal 1 - Progress (Week 2): Met OT Short Term Goal 2 (Week 2): Pt will manage LUE prior to transfers with min VC OT Short Term Goal 2 - Progress (Week 2): Met OT Short Term Goal 3 (Week 2): Pt will recall hemi dressing techniques with min VC OT Short Term Goal 3 - Progress (Week 2): Progressing toward goal OT Short Term Goal 4 (Week 2): Pt will thread BLE into pants with min A OT Short Term Goal 4 - Progress (Week 2): Progressing toward goal  Skilled Therapeutic Interventions/Progress Updates:    Pt completed transfer from supine to sit EOB with min assist to the right side of the bed.  He did require some assistance to advance the LLE over the side as it was crossed under him when he sat up.  He completed stand pivot transfer to the wheelchair with mod assist and then requested to brush his teeth.  He was rolled over to the sink in the wheelchair and completed oral hygiene with supervision.  Upon finishing this, he requested to then use the bathroom.  He completed stand pivot transfer to the toilet with mod assist and required mod assist for completion of clothing management.  Finished with transfer back to the wheelchair and transition to the sink for washing his hands.  Min assist to complete this.  Next part of session took place in the gym with focus on LUE weightbearing and posture.  Pt sat on EOB with posterior pelvic tilt.  He is able to initiate and achieve anterior pelvic tilt with min facilitation but only maintains for a few seconds at a time.  Placed LUE in weight bearing on the mat and had pt focus on completing forward reaching with the RUE while attempting to activate  elbow extension, shoulder stabilizers and maintain anterior pelvic tilt.  Pt currently needs mod to max facilitation to maintain elbow extension and balance when reaching during this task.  Educated pt on the importance of forward trunk flexion for initiation of scooting and sit to stand.  Pt tends to maintain upright posture when attempting transitional movements.  Finished session with return to the room and pt electing to transfer back to the bed with mod assist stand pivot.  Call button in reach and bed alarm in place.    Therapy Documentation Precautions:  Precautions Precautions: Fall Precaution Comments: corretrack Restrictions Weight Bearing Restrictions: No  Pain: Pain Assessment Pain Scale: Faces Pain Score: 0-No pain  Therapy/Group: Individual Therapy  Nemiah Bubar OTR/L 01/30/2018, 4:30 PM

## 2018-01-30 NOTE — Progress Notes (Signed)
Occupational Therapy Weekly Progress Note  Patient Details  Name: Caleb Hubbard MRN: [id number] Date of Birth: 25-Feb-1980  Beginning of progress report period: January 23, 2018 End of progress report period: January 30, 2018   Patient has met 2 of 4 short term goals.  Pt continues to require mod-max encouragement and multimodal cues for participation in therapy sessions.  Pt is making slow progress towards goals due to fluctuating levels of participation.  Pt has demonstrated ability to complete transfers at min assist to CGA and sit > stand with supervision with RUE support.  Pt demonstrates decreased motivation and willingness to participate in any functional tasks or anything that challenges him.  Pt has demonstrated shoulder elevation and shoulder flexion/extension in antigravity position. Have provided pt with self-ROM exercises to attempt to encourage continued motor recovery.  Have initiated use of male therapists to see if pt's engagement and participation will improve.  Pt will require hands on family education prior to d/c home to ensure pt and caregiver safety.  Patient continues to demonstrate the following deficits:  muscle weakness, decreased cardiorespiratoy endurance, impaired timing and sequencing, abnormal tone, unbalanced muscle activation and decreased coordination, decreased visual acuity and decreased visual motor skills, decreased attention to left, decreased attention, decreased awareness, decreased problem solving, decreased safety awareness, decreased memory and delayed processing and decreased sitting balance, decreased standing balance, decreased postural control, hemiplegia and decreased balance strategies and therefore will continue to benefit from skilled OT intervention to enhance overall performance with BADL and Reduce care partner burden.  Patient progressing toward long term goals..  Continue plan of care.  OT Short Term Goals Week 2:  OT Short Term Goal 1 (Week  2): Pt will don shirt with min A OT Short Term Goal 1 - Progress (Week 2): Met OT Short Term Goal 2 (Week 2): Pt will manage LUE prior to transfers with min VC OT Short Term Goal 2 - Progress (Week 2): Met OT Short Term Goal 3 (Week 2): Pt will recall hemi dressing techniques with min VC OT Short Term Goal 3 - Progress (Week 2): Progressing toward goal OT Short Term Goal 4 (Week 2): Pt will thread BLE into pants with min A OT Short Term Goal 4 - Progress (Week 2): Progressing toward goal Week 3:  OT Short Term Goal 1 (Week 3): Pt will recall hemi dressing techniques with min VC OT Short Term Goal 2 (Week 3): Pt will thread BLE into pants with min A OT Short Term Goal 3 (Week 3): Pt will complete toileting (hygiene and clothing management) with min assist    Verlin Uher, [facility] 01/30/2018, 4:26 PM

## 2018-01-30 NOTE — Progress Notes (Signed)
Speech Language Pathology Daily Session Note  Patient Details  Name: Caleb Hubbard MRN: [id number] Date of Birth: April 27, 1980  Today's Date: 01/30/2018 SLP Individual Time: 8756-4332 SLP Individual Time Calculation (min): 12 min  Short Term Goals: Week 3: SLP Short Term Goal 1 (Week 3): Pt will utilize external memory aids to recall daily information with supervision cues.  SLP Short Term Goal 2 (Week 3): Pt will complex semi-complex problem solving tasks with Min A cues.  SLP Short Term Goal 3 (Week 3): Pt will demonstrate selective attention in mildly distracting environment for ~ 30 minutes with Min A cues.  SLP Short Term Goal 4 (Week 3): Pt will utilize speech intelligibility strategies to achieve ~ 90% speech intelligibility at the sentence level.  SLP Short Term Goal 5 (Week 3): Pt will consume current diet with minimal overt s/s of aspiration and supervision cues for use of compensatory strategies.   Skilled Therapeutic Interventions:  Skilled treatment session focused on cognition goals. SLP received pt curled into ball in bed with eyes closed/ Pt continued to tuck head under sheets when SLP provided encouragement/explanation of therapy and hope for progress. Pt remained disengaged. CSW/administration aware/nursing aware of pt's current disengaged status.      Pain Pain Assessment Pain Scale: (no attempt to indicate)  Therapy/Group: Individual Therapy  Abbigal Radich 01/30/2018, 10:05 AM

## 2018-01-30 NOTE — Progress Notes (Signed)
Physical Therapy Session Note  Patient Details  Name: Caleb Hubbard MRN: [id number] Date of Birth: November 08, 1980  Today's Date: 01/30/2018 PT Individual Time: 0931-1000 PT Individual Time Calculation (min): 29 min   Short Term Goals: Week 1:  PT Short Term Goal 1 (Week 1): Pt will initiate gait training PT Short Term Goal 1 - Progress (Week 1): Met PT Short Term Goal 2 (Week 1): Pt will initiate w/c mobility PT Short Term Goal 2 - Progress (Week 1): Met PT Short Term Goal 3 (Week 1): Pt will maintain static standing balance with min A PT Short Term Goal 3 - Progress (Week 1): Not progressing Week 2:  PT Short Term Goal 1 (Week 2): Pt will ambulate 30 ft with LRAD & mod assist.  PT Short Term Goal 2 (Week 2): Pt will complete bed<>w/c transfers with min assist.  PT Short Term Goal 3 (Week 2): Pt will complete bed mobility with CGA. Week 3:     Skilled Therapeutic Interventions/Progress Updates:   Pt received supine in bed and agreeable to PT. Supine>sit transfer with supervision assist from PT. Squat pivot transfer to [facility] with min assist to the R. PT applied LAFO and Bil shoes.   Gait training in hall 2 x 79f with mod assist overall from PT to stabilize with LLE in stance and prevent knee collapse. Pts LUE supported on PT shoulder. Pt able to advance the LLE with increased time throughout gait training with only mild assist to weight shift off the LLE.   Pt returned to room and performed squat transfer to bed with min assist. Sit>supine completed with min assist to control the LLE, and left supine in bed with call bell in reach and all needs met.         Therapy Documentation Precautions:  Precautions Precautions: Fall Precaution Comments: corretrack Restrictions Weight Bearing Restrictions: No Pain:   denies     Therapy/Group: Individual Therapy  ALorie Phenix12/20/2019, 10:03 AM

## 2018-01-31 NOTE — Progress Notes (Signed)
Caleb Hubbard is a 37 y.o. 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 Anxiety   . Cancer ([place])   . Headache   . Hypertension   . Testicular mass      Subjective: No new complaints. No new problems. Nodding is only response to questioning  Objective: Vital signs in last 24 hours: Temp:  [98 F (36.7 C)-98.7 F (37.1 C)] 98.6 F (37 C) (12/21 0428) Pulse Rate:  [88-95] 88 (12/21 0428) Resp:  [16-18] 18 (12/21 0428) BP: (116-135)/(75-90) 116/85 (12/21 0428) SpO2:  [100 %] 100 % (12/21 0428) Weight change:  Last BM Date: 01/30/18  Intake/Output from previous day: 12/20 0701 - 12/21 0700 In: 480 [P.O.:480] Out: -   Patient Vitals for the past 24 hrs:  BP Temp Temp src Pulse Resp SpO2 Weight  01/31/18 0428 116/85 98.6 F (37 C) Oral 88 18 100 % -  01/30/18 1956 135/75 98 F (36.7 C) - 91 18 100 % -  01/30/18 1453 125/90 98.7 F (37.1 C) Oral 95 16 100 % -    Physical Exam General: No apparent distress   HEENT: not dry Lungs: Normal effort. Lungs clear to auscultation, no crackles or wheezes. Cardiovascular: Regular rate and rhythm, no edema Abdomen: S/NT/ND; BS(+) Musculoskeletal:  unchanged Neurological: No new neurological deficits with left-sided weakness Wounds: N/A    Skin: clear   Mental state: Alert, oriented, cooperative    Lab Results: BMET    Component Value Date/Time   NA 136 01/26/2018 0530   K 4.3 01/26/2018 0530   CL 100 01/26/2018 0530   CO2 25 01/26/2018 0530   GLUCOSE 106 (H) 01/26/2018 0530   BUN 17 01/26/2018 0530   CREATININE 1.03 01/26/2018 0530   CALCIUM 9.9 01/26/2018 0530   GFRNONAA >60 01/26/2018 0530   GFRAA >60 01/26/2018 0530   CBC    Component Value Date/Time   WBC 13.6 (H) 01/30/2018 0535   RBC 2.97 (L) 01/30/2018 0535   HGB 9.4 (L) 01/30/2018 0535   HCT 29.2 (L) 01/30/2018 0535   PLT 524 (H) 01/30/2018 0535   MCV 98.3 01/30/2018 0535    MCH 31.6 01/30/2018 0535   MCHC 32.2 01/30/2018 0535   RDW 15.6 (H) 01/30/2018 0535   LYMPHSABS 2.7 01/16/2018 0507   MONOABS 1.5 (H) 01/16/2018 0507   EOSABS 0.0 01/16/2018 0507   BASOSABS 0.0 01/16/2018 0507    Medications: I have reviewed the patient's current medications.  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  Blood pressure remains well controlled Dyslipidemia continue atorvastatin   Length of stay, days: 16  Marletta Lor , MD 01/31/2018, 11:30 AM

## 2018-02-01 NOTE — Progress Notes (Signed)
Caleb Hubbard is a 37 y.o. male who is admitted for CIR with functional deficits from a basilar artery occlusion with left hemiparesis  Past Medical History:  Diagnosis Date  . Anxiety   . Cancer ([place])   . Headache   . Hypertension   . Testicular mass      Subjective: No new complaints.  No verbal response-only nods head in response to questioning  Objective: Vital signs in last 24 hours: Temp:  [97.9 F (36.6 C)-98.5 F (36.9 C)] 97.9 F (36.6 C) (12/22 0514) Pulse Rate:  [83-97] 83 (12/22 0514) Resp:  [14-18] 14 (12/22 0514) BP: (115-132)/(83-99) 115/83 (12/22 0514) SpO2:  [99 %-100 %] 99 % (12/22 0514) Weight change:  Last BM Date: 01/30/18  Intake/Output from previous day: 12/21 0701 - 12/22 0700 In: 340 [P.O.:340] Out: -    Patient Vitals for the past 24 hrs:  BP Temp Temp src Pulse Resp SpO2  02/01/18 0514 115/83 97.9 F (36.6 C) - 83 14 99 %  01/31/18 1926 (!) 132/99 98.3 F (36.8 C) Oral 97 18 100 %  01/31/18 1544 122/83 98.5 F (36.9 C) Oral 94 18 100 %     Physical Exam General: No apparent distress   HEENT: not dry Lungs: Normal effort. Lungs clear to auscultation, no crackles or wheezes. Cardiovascular: Regular rate and rhythm, no edema Abdomen: S/NT/ND; BS(+) Musculoskeletal:  unchanged Neurological: No new neurological deficits with left-sided weakness Extremities.  No edema Skin: No rash Mental state: Alert, oriented, cooperative    Lab Results: BMET    Component Value Date/Time   NA 136 01/26/2018 0530   K 4.3 01/26/2018 0530   CL 100 01/26/2018 0530   CO2 25 01/26/2018 0530   GLUCOSE 106 (H) 01/26/2018 0530   BUN 17 01/26/2018 0530   CREATININE 1.03 01/26/2018 0530   CALCIUM 9.9 01/26/2018 0530   GFRNONAA >60 01/26/2018 0530   GFRAA >60 01/26/2018 0530   CBC    Component Value Date/Time   WBC 13.6 (H) 01/30/2018 0535   RBC 2.97 (L) 01/30/2018 0535   HGB 9.4 (L) 01/30/2018 0535   HCT 29.2 (L) 01/30/2018 0535   PLT 524 (H)  01/30/2018 0535   MCV 98.3 01/30/2018 0535   MCH 31.6 01/30/2018 0535   MCHC 32.2 01/30/2018 0535   RDW 15.6 (H) 01/30/2018 0535   LYMPHSABS 2.7 01/16/2018 0507   MONOABS 1.5 (H) 01/16/2018 0507   EOSABS 0.0 01/16/2018 0507   BASOSABS 0.0 01/16/2018 0507     Medications: I have reviewed the patient's current medications.  Assessment/Plan:  Status post brainstem infarction with left hemiparesis Essential hypertension.  Stable.  No change in therapy DVT prophylaxis continue Lovenox Dyslipidemia.  Continue statin therapy    Length of stay, days: 17  Marletta Lor , MD 02/01/2018, 10:23 AM

## 2018-02-01 NOTE — Plan of Care (Signed)
  Problem: RH BOWEL ELIMINATION Goal: RH STG MANAGE BOWEL W/MEDICATION W/ASSISTANCE Description STG Manage Bowel with Medication with min Assistance.  Outcome: Progressing; laxatives given LBM 12/20

## 2018-02-02 ENCOUNTER — Inpatient Hospital Stay (HOSPITAL_COMMUNITY): Payer: Medicaid Other

## 2018-02-02 ENCOUNTER — Inpatient Hospital Stay (HOSPITAL_COMMUNITY): Payer: Medicaid Other | Admitting: Occupational Therapy

## 2018-02-02 ENCOUNTER — Inpatient Hospital Stay (HOSPITAL_COMMUNITY): Payer: Medicaid Other | Admitting: Speech Pathology

## 2018-02-02 LAB — CBC
HCT: 30.3 % — ABNORMAL LOW (ref 39.0–52.0)
Hemoglobin: 9.9 g/dL — ABNORMAL LOW (ref 13.0–17.0)
MCH: 32 pg (ref 26.0–34.0)
MCHC: 32.7 g/dL (ref 30.0–36.0)
MCV: 98.1 fL (ref 80.0–100.0)
PLATELETS: 420 10*3/uL — AB (ref 150–400)
RBC: 3.09 MIL/uL — ABNORMAL LOW (ref 4.22–5.81)
RDW: 15.4 % (ref 11.5–15.5)
WBC: 11.1 10*3/uL — ABNORMAL HIGH (ref 4.0–10.5)
nRBC: 0 % (ref 0.0–0.2)

## 2018-02-02 LAB — BASIC METABOLIC PANEL
Anion gap: 9 (ref 5–15)
BUN: 22 mg/dL — ABNORMAL HIGH (ref 6–20)
CO2: 28 mmol/L (ref 22–32)
Calcium: 9.9 mg/dL (ref 8.9–10.3)
Chloride: 99 mmol/L (ref 98–111)
Creatinine, Ser: 1.15 mg/dL (ref 0.61–1.24)
GFR calc non Af Amer: >60 mL/min (ref >60)
Glucose, Bld: 111 mg/dL — ABNORMAL HIGH (ref 70–99)
Potassium: 4.1 mmol/L (ref 3.5–5.1)
Sodium: 136 mmol/L (ref 135–145)

## 2018-02-02 NOTE — Progress Notes (Addendum)
Speech Language Pathology Daily Session Note  Patient Details  Name: Caleb Hubbard MRN: [id number] Date of Birth: May 13, 1980  Today's Date: 02/03/2018 SLP Individual Time: 1324-4010 SLP Individual Time Calculation (min): 23 min  Short Term Goals: Week 3: SLP Short Term Goal 1 (Week 3): Pt will utilize external memory aids to recall daily information with supervision cues.  SLP Short Term Goal 2 (Week 3): Pt will complex semi-complex problem solving tasks with Min A cues.  SLP Short Term Goal 3 (Week 3): Pt will demonstrate selective attention in mildly distracting environment for ~ 30 minutes with Min A cues.  SLP Short Term Goal 4 (Week 3): Pt will utilize speech intelligibility strategies to achieve ~ 90% speech intelligibility at the sentence level.  SLP Short Term Goal 5 (Week 3): Pt will consume current diet with minimal overt s/s of aspiration and supervision cues for use of compensatory strategies.   Skilled Therapeutic Interventions:Skilled ST services focused on cognitive skills. Pt was closed eyes and out head under blanket when entering room, and did not respond verbally to request, however nodded agreeable to therapy. Pt required max A verbal cues of encouragement and then began verbally communicating in response to questions with 80% intelligibility at sentence level. SLP facilitated semi-complex problem solving skills utilizing scheduling task, pt required max-mod A verbal cues for problem solving and error awareness. Pt appeared to be disengaged impacting preformance in cognitive task. Pt demonstarted ability to express wants/needs requesting lights off and phone plugged in.. Pt was left in room with call bell within reach and bed alarm set. Recommend to continue skilled ST services.      Pain Pain Assessment Pain Scale: Faces Pain Score: 0-No pain  Therapy/Group: Individual Therapy  Maliah Pyles  [facility] 02/02/2018, 6:06 PM

## 2018-02-02 NOTE — Progress Notes (Signed)
[place] PHYSICAL MEDICINE & REHABILITATION PROGRESS NOTE   Subjective/Complaints:  Awake and alert this am more interactive  ROS: Limited due to cognitive/behavioral    Objective:   No results found. Recent Labs    02/02/18 0555  WBC 11.1*  HGB 9.9*  HCT 30.3*  PLT 420*   Recent Labs    02/02/18 0555  NA 136  K 4.1  CL 99  CO2 28  GLUCOSE 111*  BUN 22*  CREATININE 1.15  CALCIUM 9.9    Intake/Output Summary (Last 24 hours) at 02/02/2018 0828 Last data filed at 02/02/2018 0755 Gross per 24 hour  Intake 240 ml  Output -  Net 240 ml     Physical Exam: Vital Signs Blood pressure 114/70, pulse 77, temperature 98.7 F (37.1 C), temperature source Oral, resp. rate 15, height 5\' 11"  (1.803 m), weight 54.7 kg, SpO2 100 %.  Constitutional: No distress . Vital signs reviewed. HEENT: EOMI, oral membranes moist Neck: supple Cardiovascular: RRR without murmur. No JVD    Respiratory: CTA Bilaterally without wheezes or rales. Normal effort    GI: BS +, non-tender, non-distended  Skin: No evidence of breakdown, no evidence of rash Neurologic: Cranial nerves II through XII intact, motor strength is 4/5 in right deltoid, bicep, tricep, grip, hip flexor, knee extensors, ankle dorsiflexor and plantar flexor, 0/5 on left except 3+ at trap  Tone MAS 2/4 Left hamstrings and Left elbow flexors--motor exam stable Sensory exam: withdraws to pain stim Musculoskeletal: Full range of motion in all 4 extremities. No joint swelling    Assessment/Plan: 1. 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  Physiatrist is providing close team supervision and 24 hour management of active medical problems listed below.  Physiatrist and rehab team continue to assess barriers to discharge/monitor patient progress toward functional  and medical goals  Care Tool:  Bathing  Bathing activity did not occur: Refused Body parts bathed by patient: Chest, Abdomen, Front perineal area, Right upper leg, Left upper leg, Right lower leg, Face, Left arm   Body parts bathed by helper: Right arm, Left arm, Buttocks, Left lower leg, Right lower leg     Bathing assist Assist Level: Moderate Assistance - Patient 50 - 74%     Upper Body Dressing/Undressing Upper body dressing   What is the patient wearing?: Pull over shirt    Upper body assist Assist Level: Maximal Assistance - Patient 25 - 49%    Lower Body Dressing/Undressing Lower body dressing      What is the patient wearing?: Pants, Incontinence brief     Lower body assist Assist for lower body dressing: Maximal Assistance - Patient 25 - 49%     Toileting Toileting    Toileting assist Assist for toileting: Moderate Assistance - Patient 50 - 74%     Transfers Chair/bed transfer  Transfers assist  Chair/bed transfer activity did not occur: N/A  Chair/bed transfer assist level: Minimal Assistance - Patient > 75%     Locomotion Ambulation   Ambulation assist   Ambulation activity did not occur: Safety/medical concerns  Assist level: Moderate Assistance - Patient 50 - 74% Assistive device: Hand held assist Max distance: 23ft   Walk 10 feet activity   Assist  Walk 10 feet activity did not occur: Safety/medical concerns  Assist level: Moderate Assistance - Patient - 50 - 74% Assistive device: [employer]  Walk 50 feet activity   Assist Walk 50 feet with 2 turns activity did not occur: Safety/medical concerns         Walk 150 feet activity   Assist Walk 150 feet activity did not occur: Safety/medical concerns         Walk 10 feet on uneven surface  activity   Assist Walk 10 feet on uneven surfaces activity did not occur: Safety/medical concerns         Wheelchair     Assist Will patient use wheelchair at discharge?:  Yes Type of Wheelchair: Manual Wheelchair activity did not occur: Safety/medical concerns  Wheelchair assist level: Supervision/Verbal cueing Max wheelchair distance: 40 ft    Wheelchair 50 feet with 2 turns activity    Assist    Wheelchair 50 feet with 2 turns activity did not occur: Safety/medical concerns   Assist Level: Supervision/Verbal cueing   Wheelchair 150 feet activity     Assist Wheelchair 150 feet activity did not occur: Safety/medical concerns        Medical Problem List and Plan: 1. 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 DVT Prophylaxis/Anticoagulation: Pharmaceutical:Lovenox instead of heparin, to reduce needle sticks and increase efficacy in post stroke population 3. Pain Management:tylenol prn 4. Mood:appears very disengaged, suspect significant depression  -increase ritalin to 10mg  bid  -add zoloft 25mg  q AM (as opposed to celexa QHS, given that he's already getting tizanidine and gabapentin at [facility])  5. Neuropsych: This patientis not fullycapable of making decisions on hisown behalf. 6. Skin/Wound Care:routine pressure relief measures. 7. Fluids/Electrolytes/Nutrition:Monitor I/O.   8. FTD:DUKGURK BID. Continue metoprolol and catapres. Vitals:   02/01/18 1919 02/02/18 0425  BP: (!) 131/91 114/70  Pulse: 83 77  Resp: 15 15  Temp: 98.1 F (36.7 C) 98.7 F (37.1 C)  SpO2: 100% 100%  controlled 12/23    9. Pancytopenia:Resolved Has resolved post Granix--d/c neutropenic precautions.Leukocytosis may be related to granix, also with thrombocytosis and anemia,appears to be trending down   -wbc's/ platelets trending down (11.3/684 on 12/16)  -hgb trending up 9.1  10. Prediabetes: Hgb A1c- 6.4. Monitor BS ac/hs and use SSI for elevated  BS 11. Testicular cancer:Dr. Magrinatrecommends no further chemo and monitoring AFP/HCG every 6 months X 2 years.  12. Hypertriglyceridemia:On lipitor. 13. Depression: Was on Zyprexa for mood stabilization.   Neuropsych is following  -increased trazodone which has been beneficial for sleep. Sertraline,D/C ritalin 14.  LUE and LLE limb pain non focal , likely CVA related and a combination of increased tone and neurogenic pain  - increased tizanidine to 4mg  qhs  - gabapentin 100mg  qhs- which hasn't overly sedated him    LOS: 18 days A FACE TO FACE EVALUATION WAS PERFORMED  [name] 02/02/2018, 8:28 AM

## 2018-02-02 NOTE — Progress Notes (Signed)
Occupational Therapy Session Note  Patient Details  Name: Caleb Hubbard MRN: [id number] Date of Birth: 10-31-1980  Today's Date: 02/02/2018 OT Individual Time: 1430-1510 OT Individual Time Calculation (min): 40 min    Short Term Goals: Week 2:  OT Short Term Goal 1 (Week 2): Pt will don shirt with min A OT Short Term Goal 1 - Progress (Week 2): Met OT Short Term Goal 2 (Week 2): Pt will manage LUE prior to transfers with min VC OT Short Term Goal 2 - Progress (Week 2): Met OT Short Term Goal 3 (Week 2): Pt will recall hemi dressing techniques with min VC OT Short Term Goal 3 - Progress (Week 2): Progressing toward goal OT Short Term Goal 4 (Week 2): Pt will thread BLE into pants with min A OT Short Term Goal 4 - Progress (Week 2): Progressing toward goal  Skilled Therapeutic Interventions/Progress Updates:    Pt completed selfcare retraining per request during session.  Mod assist for stand pivot transfer from bed to the wheelchair and from wheelchair to the shower seat.  He needed mod instructional cueing for right hand placement with all transfers, to not try and pull up on therapist but to instead push up from the wheelchair.  Pt impulsive at times, standing up to transfer before following verbal cue that therapist gave him to scoot forward first.  He demonstrated poor thoroughness with bathing and needed max instructional cueing to complete shower.  Max hand over hand with the LUE to wash the RUE.  Min assist for sit to stand and standing while using the grab bar for support.  He transferred back out to the wheelchair and then over to the sink for dressing.  Mod assist for pullover shirt with max assist for brief and pants, adhering to hemi dressing techniques.  He demonstrated impulsivity again and attempted to place the RLE in his pants prior to the left and did the same with the shirt.  Max instructional cueing given to place the weaker left side in first.  Finished session with stand pivot  transfer to the bed to the right, with pt impulsively completing transfer.  Pt left with call button and phone in reach and bed alarm in place.    Therapy Documentation Precautions:  Precautions Precautions: Fall Precaution Comments: corretrack Restrictions Weight Bearing Restrictions: No  Pain: Pain Assessment Pain Scale: Faces Pain Score: 0-No pain ADL: See Care Tool Section for some details of ADL  Therapy/Group: Individual Therapy  Slevin Gunby OTR/L 02/02/2018, 4:13 PM

## 2018-02-02 NOTE — Progress Notes (Signed)
Speech Language Pathology Daily Session Note  Patient Details  Name: Mardell Cragg MRN: [id number] Date of Birth: 07-Mar-1980  Today's Date: 02/02/2018 SLP Individual Time: 1000-1030 SLP Individual Time Calculation (min): 30 min  Short Term Goals: Week 3: SLP Short Term Goal 1 (Week 3): Pt will utilize external memory aids to recall daily information with supervision cues.  SLP Short Term Goal 2 (Week 3): Pt will complex semi-complex problem solving tasks with Min A cues.  SLP Short Term Goal 3 (Week 3): Pt will demonstrate selective attention in mildly distracting environment for ~ 30 minutes with Min A cues.  SLP Short Term Goal 4 (Week 3): Pt will utilize speech intelligibility strategies to achieve ~ 90% speech intelligibility at the sentence level.  SLP Short Term Goal 5 (Week 3): Pt will consume current diet with minimal overt s/s of aspiration and supervision cues for use of compensatory strategies.   Skilled Therapeutic Interventions:  Skilled treatment session focused on cognition goals. SLP received pt in bed and despite Total A to engage pt in cognitive tasks, pt briefly nodded his head several times but no other effects to perform any presented tasks. Pt with no verbalization throughout session.      Pain Pain Assessment Pain Scale: 0-10(no attempt to communicate) Pain Score: 0-No pain Faces Pain Scale: No hurt  Therapy/Group: Individual Therapy  Ryan Ogborn 02/02/2018, 10:50 AM

## 2018-02-02 NOTE — Progress Notes (Signed)
Physical Therapy Session Note  Patient Details  Name: Caleb Hubbard MRN: [id number] Date of Birth: 1981/02/03  Today's Date: 02/02/2018 PT Individual Time: 0800-0850 PT Individual Time Calculation (min): 50 min   Short Term Goals:  Week 3:  PT Short Term Goal 1 (Week 3): Pt will complete bed mobility with CGA.  PT Short Term Goal 2 (Week 3): Pt will ambulate 50 ft with LRAD & mod assist.  PT Short Term Goal 3 (Week 3): Pt will initiate stair training.   Skilled Therapeutic Interventions/Progress Updates:   Pt resting in bed.  No pain reported.  Supine> sit with supervision, with extra time to move LLE off of bed.  In sitting EOB, PT donned socks and shoes; pt declined attempting this.  Stand pivot transfer to R iwht min assist.  neuromuscular re-education via forced use, mulitmodal cues for LLe activation of mass extension, using KInetron in sitting in w/c, at level 40 cm/sec,  X 10 cycles x 2, for alternating reciprocal movement x bil LEs at level 70 cm/sec x 20 cycles x 1.  Gait training using hallway railing, mod assist, mod cues LLE AFO, trialling a different LAFO with pre-tibial component and higher calf strut.  This AFO is set in too much DF; pt unable to extend knee.  He would benefit from posterior leaf spring AFO sized to fit his LLE.  Transfer back to bed as above.  Sit> supine with min assist for LLE.  Pt left resting in bed, in R side lying, with bed alarm set and needs at hand.     Therapy Documentation Precautions:  Precautions Precautions: Fall Precaution Comments: corretrack Restrictions Weight Bearing Restrictions: No  Pain: Pain Assessment Pain Scale: 0-10 Pain Score: 0-No pain Faces Pain Scale: No hurt   Therapy/Group: Individual Therapy  Cornisha Zetino 02/02/2018, 10:47 AM

## 2018-02-03 ENCOUNTER — Inpatient Hospital Stay (HOSPITAL_COMMUNITY): Payer: Medicaid Other

## 2018-02-03 ENCOUNTER — Inpatient Hospital Stay (HOSPITAL_COMMUNITY): Payer: Medicaid Other | Admitting: Occupational Therapy

## 2018-02-03 ENCOUNTER — Inpatient Hospital Stay (HOSPITAL_COMMUNITY): Payer: Medicaid Other | Admitting: Speech Pathology

## 2018-02-03 ENCOUNTER — Inpatient Hospital Stay (HOSPITAL_COMMUNITY): Payer: Medicaid Other | Admitting: Physical Therapy

## 2018-02-03 NOTE — Patient Care Conference (Signed)
Inpatient RehabilitationTeam Conference and Plan of Care Update Date: 02/03/2018   Time: 9:02 AM    Patient Name: Caleb Hubbard      Medical Record Number: [id number]  Date of Birth: [birth year] Sex: Male         Room/Bed: 4W14C/4W14C-01 Payor Info: Payor: MEDICAID PENDING / Plan: MEDICAID PENDING / Product Type: *No Product type* /    Admitting Diagnosis: CVA  Admit Date/Time:  01/15/2018  3:26 PM Admission Comments: No comment available   Primary Diagnosis:  <principal problem not specified> Principal Problem: <principal problem not specified>  Patient Active Problem List   Diagnosis Date Noted  . Reactive depression   . 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 Cancer ([place])   . Endotracheally intubated   . 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 Alysia Penna Social Worker Present: Ovidio Kin, LCSW Nurse Present: Dorien Chihuahua, RN PT Present: Leavy Cella, PT OT Present: Simonne Come, OT SLP Present: Charolett Bumpers, SLP PPS Coordinator present : Daiva Nakayama, RN, CRRN     Current Status/Progress Goal Weekly Team Focus  Medical   depression adn reduce motivation, intermittent tachy  improve mood, particiaption , reduced fall risk  monitor/adjust meds for depression   Bowel/Bladder   Continent of bowel, few periods of bladder incontience. LBM 12/22  Few episodes of incontinence, offer assistance with toileting q2h   assess bowel and bladder needs qshift and PRN   Swallow/Nutrition/ Hydration   Dysphagia 2 with thin   Mod A  dysphagia 3 trials   ADL's   Mod assist for UB bathing and LB bathing sit to stand.  Mod assist for UB dressing with max assist for LB dressing. Impulsive at times with decreased carry through of techniques taught with regards to hemi dressing, mod assist for stand pivot transfers.  Brunnstrum stage II in  the left arm and stage I in the hand.  Some noted shoulder movement but needs max assist to integrate functionally into ADL.    Contact guard - Min assist  selfcare retraining, neuromuscular re-education. pt/family education, balance retraining, DME education   Mobility             Communication   largely nonverbal throughout most sessions         Safety/Cognition/ Behavioral Observations  Max-Mod A  Mod A  task initation, recall selective attention and basic to semi-complex problem solving    Pain   N/o pain   Pt to remain pain free  assess pain Qshift and PRN   Skin   No skin impairments  remain free of skin impairments. turn and reposition Pt q2  assess skin Qshift and PRN      *See Care Plan and progress notes for long and short-term goals.     Barriers to Discharge  Current Status/Progress Possible Resolutions Date Resolved   Physician    Behavior;Medical stability     pt with minimal participation, Mother vacationing  cont Qd therapy , neuropsych follow up      Nursing                  PT                    OT                  SLP  SW                Discharge Planning/Teaching Needs:  Parents to come 12/30 for education then discharge home to their home. Aware of 24 hr care at discharge and team recommends a ramp but Mom feels can manage without one      Team Discussion:  Progressing slowly and if would participate more could make more progress in therapies. Neuro-psych seeing for coping and depression. Trials of Dys 3 thin diet with speech. Family education with parents on MOnday. Aware will need 24 hr care.  Revisions to Treatment Plan:  DC 12/30 after family education    Continued Need for Acute Rehabilitation Level of Care: The patient requires daily medical management by a physician with specialized training in physical medicine and rehabilitation for the following conditions: Daily direction of a multidisciplinary physical rehabilitation program  to ensure safe treatment while eliciting the highest outcome that is of practical value to the patient.: Yes Daily medical management of patient stability for increased activity during participation in an intensive rehabilitation regime.: Yes Daily analysis of laboratory values and/or radiology reports with any subsequent need for medication adjustment of medical intervention for : Neurological problems;Mood/behavior problems   I attest that I was present, lead the team conference, and concur with the assessment and plan of the team.   Elease Hashimoto 02/06/2018, 9:02 AM

## 2018-02-03 NOTE — Progress Notes (Addendum)
Physical Therapy Session Note  Patient Details  Name: Caleb Hubbard MRN: [id number] Date of Birth: September 21, 1980  Today's Date: 02/03/2018 PT Missed Time: 94 Minutes Missed Time Reason: Patient unwilling to participate   Pt sleeping upon arrival, opened eyes in response to name but did not speak in response to any of therapist's questions and requests to participate in PT. When asked if he would participate, he shook his head "no". Missed 30 min of skilled PT 2/2 refusal. Returned later in day in attempts to make-up therapy, pt continued to decline.   Clark Clowdus Clent Demark 02/03/2018, 11:57 AM

## 2018-02-03 NOTE — Progress Notes (Addendum)
[facility] PHYSICAL MEDICINE & REHABILITATION PROGRESS NOTE   Subjective/Complaints:  Very sleepy but responds to voice  ROS: Limited due to cognitive/behavioral    Objective:   No results found. Recent Labs    02/02/18 0555  WBC 11.1*  HGB 9.9*  HCT 30.3*  PLT 420*   Recent Labs    02/02/18 0555  NA 136  K 4.1  CL 99  CO2 28  GLUCOSE 111*  BUN 22*  CREATININE 1.15  CALCIUM 9.9    Intake/Output Summary (Last 24 hours) at 02/03/2018 0803 Last data filed at 02/03/2018 0757 Gross per 24 hour  Intake 480 ml  Output -  Net 480 ml     Physical Exam: Vital Signs Blood pressure 111/82, pulse 60, temperature (!) 97.5 F (36.4 C), temperature source Oral, resp. rate 15, height '5\' 11"'  (1.803 m), weight 53.5 kg, SpO2 100 %.  Constitutional: No distress . Vital signs reviewed. HEENT: EOMI, oral membranes moist Neck: supple Cardiovascular: RRR without murmur. No JVD    Respiratory: CTA Bilaterally without wheezes or rales. Normal effort    GI: BS +, non-tender, non-distended  Skin: No evidence of breakdown, no evidence of rash Neurologic: Cranial nerves II through XII intact, motor strength is 4/5 in right deltoid, bicep, tricep, grip, hip flexor, knee extensors, ankle dorsiflexor and plantar flexor, 0/5 on left except 3+ at trap  Tone MAS 2/4 Left hamstrings and Left elbow flexors--motor exam stable Sensory exam: withdraws to pain stim Musculoskeletal: Full range of motion in all 4 extremities. No joint swelling    Assessment/Plan: 1. 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  Physiatrist is providing close team supervision and 24 hour management of active medical problems listed below.  Physiatrist and rehab team continue to assess barriers to discharge/monitor patient progress toward functional and  medical goals  Care Tool:  Bathing  Bathing activity did not occur: Refused Body parts bathed by patient: Left arm, Chest, Abdomen, Right upper leg, Left upper leg   Body parts bathed by helper: Right lower leg, Left lower leg, Front perineal area, Buttocks Body parts n/a: Face   Bathing assist Assist Level: Moderate Assistance - Patient 50 - 74%     Upper Body Dressing/Undressing Upper body dressing   What is the patient wearing?: Pull over shirt    Upper body assist Assist Level: Moderate Assistance - Patient 50 - 74%    Lower Body Dressing/Undressing Lower body dressing      What is the patient wearing?: Pants, Incontinence brief     Lower body assist Assist for lower body dressing: Maximal Assistance - Patient 25 - 49%     Toileting Toileting    Toileting assist Assist for toileting: Moderate Assistance - Patient 50 - 74%     Transfers Chair/bed transfer  Transfers assist  Chair/bed transfer activity did not occur: N/A  Chair/bed transfer assist level: Minimal Assistance - Patient > 75%     Locomotion Ambulation   Ambulation assist   Ambulation activity did not occur: Safety/medical concerns  Assist level: Moderate Assistance - Patient 50 - 74% Assistive device: Other (comment)(hallway railing,LLE AFO) Max distance: 25   Walk 10 feet activity   Assist  Walk 10 feet activity did not occur: Safety/medical concerns  Assist level: Moderate Assistance - Patient - 50 - 74% Assistive device: Orthosis, Other (comment)(railing)   Walk  50 feet activity   Assist Walk 50 feet with 2 turns activity did not occur: Safety/medical concerns         Walk 150 feet activity   Assist Walk 150 feet activity did not occur: Safety/medical concerns         Walk 10 feet on uneven surface  activity   Assist Walk 10 feet on uneven surfaces activity did not occur: Safety/medical concerns         Wheelchair     Assist Will patient use  wheelchair at discharge?: Yes Type of Wheelchair: Manual Wheelchair activity did not occur: Safety/medical concerns  Wheelchair assist level: Supervision/Verbal cueing Max wheelchair distance: 40 ft    Wheelchair 50 feet with 2 turns activity    Assist    Wheelchair 50 feet with 2 turns activity did not occur: Safety/medical concerns   Assist Level: Supervision/Verbal cueing   Wheelchair 150 feet activity     Assist Wheelchair 150 feet activity did not occur: Safety/medical concerns        Medical Problem List and Plan: 1. 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 Formulized individual treatment plan based on medical history, underlying problem and comorbidities. 2. DVT Prophylaxis/Anticoagulation: Pharmaceutical:Lovenox instead of heparin, to reduce needle sticks and increase efficacy in post stroke population 3. Pain Management:tylenol prn 4. Mood:appears very disengaged, suspect significant depression  -increase ritalin to 62m bid  -add zoloft 251mq AM (as opposed to celexa QHS, given that he's already getting tizanidine and gabapentin at [facility])  5. Neuropsych: This patientis not fullycapable of making decisions on hisown behalf. 6. Skin/Wound Care:routine pressure relief measures. 7. Fluids/Electrolytes/Nutrition:Monitor I/O.   8. HTMMC:RFVOHKGID. Continue metoprolol and catapres. Vitals:   02/02/18 1950 02/03/18 0447  BP: (!) 133/97 111/82  Pulse: 96 60  Resp: 15 15  Temp: 98.9 F (37.2 C) (!) 97.5 F (36.4 C)  SpO2: 100% 100%  controlled 12/24    9. Pancytopenia:Resolved Has resolved post Granix--d/c neutropenic precautions.Leukocytosis may be related to  granix, also with thrombocytosis and anemia,appears to be trending down   -wbc's/ platelets trending down (11.3/684 on 12/16)  -hgb trending up 9.1  10. Prediabetes: Hgb A1c- 6.4. Monitor BS ac/hs and use SSI for elevated BS 11. Testicular cancer:Dr. Magrinatrecommends no further chemo and monitoring AFP/HCG every 6 months X 2 years.  12. Hypertriglyceridemia:On lipitor. 13. Depression: Was on Zyprexa for mood stabilization.   Neuropsych is following  -increased trazodone which has been beneficial for sleep. Sertraline 2569maily 14.  LUE and LLE limb pain non focal , likely CVA related and a combination of increased tone and neurogenic pain  - increased tizanidine to 4mg44ms- sleeping better  - gabapentin 100mg11m- which hasn't overly sedated him    LOS: 19 days A FACE TO FACE EVALUATION WAS PERFORMED  AndreCharlett Blake4/2019, 8:03 AM

## 2018-02-03 NOTE — Plan of Care (Signed)
  Problem: RH SAFETY Goal: RH STG ADHERE TO SAFETY PRECAUTIONS W/ASSISTANCE/DEVICE Description STG Adhere to Safety Precautions With min Assistance/Device.  Outcome: Progressing  Call light within reach, bed alarm, floor mats, proper footwear

## 2018-02-03 NOTE — Progress Notes (Signed)
Occupational Therapy Note  Patient Details  Name: Caleb Hubbard MRN: [id number] Date of Birth: [birth year]  Today's Date: 02/03/2018 OT Missed Time: 65 Minutes Missed Time Reason: Patient unwilling/refused to participate without medical reason  Pt missed 30 mins scheduled OT treatment session due to refusal.  Pt received upright in bed with nurse tech providing supervision for meal.  Therapist offered to continue providing supervision for meal or to engage in therapeutic activity.  Pt looked at therapist and then gestured for her to leave.  Therapist asked again to understand pt's wishes to which he gestured for her to leave again.  Nurse tech to remain to continue providing supervision.     Simonne Come 02/03/2018, 1:12 PM

## 2018-02-04 LAB — CBC
HCT: 31 % — ABNORMAL LOW (ref 39.0–52.0)
HEMOGLOBIN: 9.9 g/dL — AB (ref 13.0–17.0)
MCH: 31.5 pg (ref 26.0–34.0)
MCHC: 31.9 g/dL (ref 30.0–36.0)
MCV: 98.7 fL (ref 80.0–100.0)
Platelets: 412 10*3/uL — ABNORMAL HIGH (ref 150–400)
RBC: 3.14 MIL/uL — ABNORMAL LOW (ref 4.22–5.81)
RDW: 15.6 % — ABNORMAL HIGH (ref 11.5–15.5)
WBC: 12.2 10*3/uL — ABNORMAL HIGH (ref 4.0–10.5)
nRBC: 0 % (ref 0.0–0.2)

## 2018-02-04 MED ORDER — POLYETHYLENE GLYCOL 3350 17 G PO PACK
17.0000 g | PACK | Freq: Every day | ORAL | Status: DC
  Administered 2018-02-04 – 2018-02-09 (×6): 17 g via ORAL
  Filled 2018-02-04 (×6): qty 1

## 2018-02-04 MED ORDER — MAGNESIUM HYDROXIDE 400 MG/5ML PO SUSP
30.0000 mL | Freq: Once | ORAL | Status: AC
  Administered 2018-02-04: 30 mL via ORAL
  Filled 2018-02-04: qty 30

## 2018-02-04 MED ORDER — ALUM & MAG HYDROXIDE-SIMETH 200-200-20 MG/5ML PO SUSP: 30.0000 mL | ORAL | Status: DC | PRN

## 2018-02-04 MED ORDER — MAGNESIUM HYDROXIDE NICU ORAL SYRINGE 400 MG/5 ML
30.0000 mL | Freq: Once | ORAL | Status: DC
  Administered 2018-02-04: 30 mL via ORAL
  Filled 2018-02-04: qty 30

## 2018-02-04 NOTE — Plan of Care (Signed)
  Problem: Spiritual Needs Goal: Ability to function at adequate level Outcome: Progressing   Problem: Consults Goal: RH STROKE PATIENT EDUCATION Description See Patient Education module for education specifics  Outcome: Progressing Goal: Nutrition Consult-if indicated Outcome: Progressing   Problem: RH BOWEL ELIMINATION Goal: RH STG MANAGE BOWEL WITH ASSISTANCE Description STG Manage Bowel with min Assistance.  Outcome: Progressing Goal: RH STG MANAGE BOWEL W/MEDICATION W/ASSISTANCE Description STG Manage Bowel with Medication with min Assistance.  Outcome: Progressing   Problem: RH SKIN INTEGRITY Goal: RH STG SKIN FREE OF INFECTION/BREAKDOWN Description Patients skin will remain free from further infection or breakdown with mod assist.   Outcome: Progressing Goal: RH STG MAINTAIN SKIN INTEGRITY WITH ASSISTANCE Description STG Maintain Skin Integrity With mod Assistance.  Outcome: Progressing   Problem: RH SAFETY Goal: RH STG ADHERE TO SAFETY PRECAUTIONS W/ASSISTANCE/DEVICE Description STG Adhere to Safety Precautions With min Assistance/Device.  Outcome: Progressing   Problem: RH COGNITION-NURSING Goal: RH STG USES MEMORY AIDS/STRATEGIES W/ASSIST TO PROBLEM SOLVE Description STG Uses Memory Aids/Strategies With min Assistance to Problem Solve.  Outcome: Progressing   Problem: RH KNOWLEDGE DEFICIT Goal: RH STG INCREASE KNOWLEDGE OF HYPERTENSION Description Min assist  Outcome: Progressing Goal: RH STG INCREASE KNOWLEDGE OF DYSPHAGIA/FLUID INTAKE Description Min assist  Outcome: Progressing Goal: RH STG INCREASE KNOWLEGDE OF HYPERLIPIDEMIA Description Min assist  Outcome: Progressing Goal: RH STG INCREASE KNOWLEDGE OF STROKE PROPHYLAXIS Description Min assist  Outcome: Progressing

## 2018-02-04 NOTE — Plan of Care (Signed)
  Problem: RH SAFETY Goal: RH STG ADHERE TO SAFETY PRECAUTIONS W/ASSISTANCE/DEVICE Description STG Adhere to Safety Precautions With min Assistance/Device.  Outcome: Progressing  Call light within reach, bed/chair alarm, proper footwear

## 2018-02-05 ENCOUNTER — Inpatient Hospital Stay (HOSPITAL_COMMUNITY): Payer: Medicaid Other

## 2018-02-05 ENCOUNTER — Inpatient Hospital Stay (HOSPITAL_COMMUNITY): Payer: Medicaid Other | Admitting: Physical Therapy

## 2018-02-05 NOTE — Plan of Care (Signed)
  Problem: Spiritual Needs Goal: Ability to function at adequate level Outcome: Progressing   Problem: Consults Goal: RH STROKE PATIENT EDUCATION Description See Patient Education module for education specifics  Outcome: Progressing Goal: Nutrition Consult-if indicated Outcome: Progressing   Problem: RH BOWEL ELIMINATION Goal: RH STG MANAGE BOWEL WITH ASSISTANCE Description STG Manage Bowel with min Assistance.  Outcome: Progressing Goal: RH STG MANAGE BOWEL W/MEDICATION W/ASSISTANCE Description STG Manage Bowel with Medication with min Assistance.  Outcome: Progressing   Problem: RH SKIN INTEGRITY Goal: RH STG SKIN FREE OF INFECTION/BREAKDOWN Description Patients skin will remain free from further infection or breakdown with mod assist.   Outcome: Progressing Goal: RH STG MAINTAIN SKIN INTEGRITY WITH ASSISTANCE Description STG Maintain Skin Integrity With mod Assistance.  Outcome: Progressing   Problem: RH SAFETY Goal: RH STG ADHERE TO SAFETY PRECAUTIONS W/ASSISTANCE/DEVICE Description STG Adhere to Safety Precautions With min Assistance/Device.  Outcome: Progressing   Problem: RH COGNITION-NURSING Goal: RH STG USES MEMORY AIDS/STRATEGIES W/ASSIST TO PROBLEM SOLVE Description STG Uses Memory Aids/Strategies With min Assistance to Problem Solve.  Outcome: Progressing   Problem: RH KNOWLEDGE DEFICIT Goal: RH STG INCREASE KNOWLEDGE OF HYPERTENSION Description Min assist  Outcome: Progressing Goal: RH STG INCREASE KNOWLEDGE OF DYSPHAGIA/FLUID INTAKE Description Min assist  Outcome: Progressing Goal: RH STG INCREASE KNOWLEGDE OF HYPERLIPIDEMIA Description Min assist  Outcome: Progressing Goal: RH STG INCREASE KNOWLEDGE OF STROKE PROPHYLAXIS Description Min assist  Outcome: Progressing

## 2018-02-05 NOTE — Progress Notes (Signed)
Physical Therapy Weekly Progress Note  Patient Details  Name: Caleb Hubbard MRN: [id number] Date of Birth: [birth year]  Beginning of progress report period: January 30, 2018 End of progress report period: February 05, 2018  Today's Date: 02/05/2018 PT Individual Time: 1660-6301 PT Individual Time Calculation (min): 30 min   Patient has met 0 of 3 short term goals.  Pt is making very minimal progress towards all LTG's. Pt has engaged in ambulation with hemiwalker & trial L AFO but only for short distances and requiring mod assist. Pt continues to require min<>mod assist for stand pivot transfers due to decreased engagement, balance, awareness, and eccentric control. Pt has demonstrated returning function in LLE but does not always attempt to utilize extremity. At this point pt will require a w/c for mobility upon d/c, potentially a hospital bed due to decreased engagement & awareness, and will need a ramp to access his home. Pt's caregiver would benefit from hands on training and education prior to pt's d/c to ensure safe d/c plan for pt & caregiver.   Patient continues to demonstrate the following deficits muscle weakness, decreased cardiorespiratoy endurance, decreased coordination, decreased attention to left, decreased initiation, decreased attention, decreased awareness, decreased problem solving, decreased safety awareness, decreased memory and delayed processing, and decreased standing balance, decreased postural control, hemiplegia and decreased balance strategies and therefore will continue to benefit from skilled PT intervention to increase functional independence with mobility.  Patient not progressing toward long term goals.  See goal revision..  Plan of care revisions: goals have been downgraded to min assist transfers, CGA bed mobility, supervision w/c mobility, mod assist gait with PT only for strengthening, and stairs goal has been d/c with recommendation of ramp for home access.  PT  Short Term Goals Week 3:  PT Short Term Goal 1 (Week 3): Pt will complete bed mobility with CGA.  PT Short Term Goal 1 - Progress (Week 3): Not met PT Short Term Goal 2 (Week 3): Pt will ambulate 50 ft with LRAD & mod assist.  PT Short Term Goal 2 - Progress (Week 3): Not met PT Short Term Goal 3 (Week 3): Pt will initiate stair training.  PT Short Term Goal 3 - Progress (Week 3): Not met Week 4:  PT Short Term Goal 1 (Week 4): STG = LTG due to estimated d/c date.   Skilled Therapeutic Interventions/Progress Updates:  Pt received in bed, agreeable to tx. No c/o or behaviors demonstrating pain. Pt transfers supine>sitting EOB with min assist and hospital bed features. Pt completes stand pivot transfers to various surfaces throughout session with min<>mod assist requiring more assistance due to poor eccentric control when lowering himself onto seat. Therapist dons socks & shoes total assist for time management & transports him around unit via w/c dependent assist for time management. Pt completes car transfer at sedan simulated height (reports his mother drives an [employer]) with cuing for sequencing and pt using BUE to place LE in/out of car. Pt completes bed mobility in apartment with min assist for sit<>supine and rolling R, but does not attempt to roll L and appears to ignore therapist's instruction for supine>sidelying>sitting method. Pt disengaged throughout session, minimally verbally communicating, only nodding head yes/no. Pt then shakes head no & reports he wants to return to bed. Pt assisted back to bed in room. Pt left with alarm set & needs at hand.   Therapy Documentation Precautions:  Precautions Precautions: Fall Precaution Comments: corretrack Restrictions Weight Bearing Restrictions: No General: PT Amount  of Missed Time (min): 15 Minutes PT Missed Treatment Reason: Patient unwilling to participate   Therapy/Group: Individual Therapy  [name] 02/05/2018, 12:50 PM

## 2018-02-05 NOTE — Progress Notes (Signed)
[facility] PHYSICAL MEDICINE & REHABILITATION PROGRESS NOTE   Subjective/Complaints:  Pt in bed. Nods head in reply to my questions  ROS: Limited due to cognitive/behavioral   Objective:   No results found. Recent Labs    02/04/18 0630  WBC 12.2*  HGB 9.9*  HCT 31.0*  PLT 412*   No results for input(s): NA, K, CL, CO2, GLUCOSE, BUN, CREATININE, CALCIUM in the last 72 hours.  Intake/Output Summary (Last 24 hours) at 02/05/2018 0921 Last data filed at 02/05/2018 0840 Gross per 24 hour  Intake 240 ml  Output -  Net 240 ml     Physical Exam: Vital Signs Blood pressure 118/74, pulse 75, temperature 98.5 F (36.9 C), temperature source Oral, resp. rate 16, height 5\' 11"  (1.803 m), weight 54.4 kg, SpO2 100 %.  Constitutional: No distress . Vital signs reviewed. HEENT: EOMI, oral membranes moist Neck: supple Cardiovascular: RRR without murmur. No JVD    Respiratory: CTA Bilaterally without wheezes or rales. Normal effort    GI: BS +, non-tender, non-distended  Skin: No evidence of breakdown, no evidence of rash Neurologic: Cranial nerves II through XII intact, motor strength is 4/5 in right deltoid, bicep, tricep, grip, hip flexor, knee extensors, ankle dorsiflexor and plantar flexor, 0/5 on left except 3+ at trap  Tone MAS 2/4 Left hamstrings and Left elbow flexors--motor exam stable Sensory exam: withdraws to pain stim Musculoskeletal: Full range of motion in all 4 extremities. No joint swelling Psych: keeps eyes closed. Limited engagement   Assessment/Plan: 1. 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  Physiatrist is providing close team supervision and 24 hour management of active medical problems listed below.  Physiatrist and rehab team continue to assess barriers to discharge/monitor patient progress  toward functional and medical goals  Care Tool:  Bathing  Bathing activity did not occur: Refused Body parts bathed by patient: Left arm, Chest, Abdomen, Right upper leg, Left upper leg   Body parts bathed by helper: Right lower leg, Left lower leg, Front perineal area, Buttocks Body parts n/a: Face   Bathing assist Assist Level: Moderate Assistance - Patient 50 - 74%     Upper Body Dressing/Undressing Upper body dressing   What is the patient wearing?: Pull over shirt    Upper body assist Assist Level: Moderate Assistance - Patient 50 - 74%    Lower Body Dressing/Undressing Lower body dressing      What is the patient wearing?: Pants, Incontinence brief     Lower body assist Assist for lower body dressing: Maximal Assistance - Patient 25 - 49%     Toileting Toileting    Toileting assist Assist for toileting: Moderate Assistance - Patient 50 - 74%     Transfers Chair/bed transfer  Transfers assist  Chair/bed transfer activity did not occur: N/A  Chair/bed transfer assist level: Minimal Assistance - Patient > 75%     Locomotion Ambulation   Ambulation assist   Ambulation activity did not occur: Safety/medical concerns  Assist level: Moderate Assistance - Patient 50 - 74% Assistive device: Other (comment)(hallway railing,LLE AFO) Max distance: 25   Walk 10 feet activity   Assist  Walk 10 feet activity did not occur: Safety/medical concerns  Assist level: Moderate Assistance - Patient - 50 - 74% Assistive device: Orthosis, Other (comment)(railing)   Walk 50 feet activity   Assist Walk 50 feet  with 2 turns activity did not occur: Safety/medical concerns         Walk 150 feet activity   Assist Walk 150 feet activity did not occur: Safety/medical concerns         Walk 10 feet on uneven surface  activity   Assist Walk 10 feet on uneven surfaces activity did not occur: Safety/medical concerns         Wheelchair     Assist Will  patient use wheelchair at discharge?: Yes Type of Wheelchair: Manual Wheelchair activity did not occur: Safety/medical concerns  Wheelchair assist level: Supervision/Verbal cueing Max wheelchair distance: 40 ft    Wheelchair 50 feet with 2 turns activity    Assist    Wheelchair 50 feet with 2 turns activity did not occur: Safety/medical concerns   Assist Level: Supervision/Verbal cueing   Wheelchair 150 feet activity     Assist Wheelchair 150 feet activity did not occur: Safety/medical concerns        Medical Problem List and Plan: 1. 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 DVT Prophylaxis/Anticoagulation: Pharmaceutical:Lovenox instead of heparin, to reduce needle sticks and increase efficacy in post stroke population 3. Pain Management:tylenol prn 4. Mood:appears very disengaged, suspect significant depression  -increased ritalin to 10mg  bid  -zoloft 25mg  qam. 5. Neuropsych: This patientis not fullycapable of making decisions on hisown behalf. 6. Skin/Wound Care:routine pressure relief measures. 7. Fluids/Electrolytes/Nutrition:Monitor I/O.   8. LPF:XTKWIOX BID. Continue metoprolol and catapres. Vitals:   02/05/18 0438 02/05/18 0615  BP: 121/76 118/74  Pulse: 87 75  Resp: 18 16  Temp: 98.8 F (37.1 C) 98.5 F (36.9 C)  SpO2: 100% 100%  controlled 12/26    9. Pancytopenia:Resolved Has resolved post Granix--d/c neutropenic precautions.Leukocytosis may be related to granix, also with thrombocytosis and anemia,appears to be trending down   -wbc's/ platelets trending down (11.3/684 on 12/16)  -hgb trending up 9.1  10. Prediabetes: Hgb A1c- 6.4. Monitor BS ac/hs and use SSI for elevated BS 11. Testicular cancer:Dr. Magrinatrecommends no further chemo and monitoring AFP/HCG every 6 months X 2  years.  12. Hypertriglyceridemia:On lipitor. 13. Depression: Was on Zyprexa for mood stabilization.   Neuropsych is following  -increased trazodone which has been beneficial for sleep. Sertraline 25mg  daily 14.  LUE and LLE limb pain non focal , likely CVA related and a combination of increased tone and neurogenic pain  - increased tizanidine to 4mg  qhs- sleeping better  - gabapentin 100mg  qhs-     LOS: 21 days A FACE TO FACE EVALUATION WAS PERFORMED  Meredith Staggers 02/05/2018, 9:21 AM

## 2018-02-05 NOTE — Progress Notes (Signed)
Speech Language Pathology Discharge Summary  Patient Details  Name: Caleb Hubbard MRN: [id number] Date of Birth: [birth year]  Today's Date: 02/09/2018 SLP Individual Time: 1130-1155 SLP Individual Time Calculation (min): 25 min   Skilled Therapeutic Interventions: Skilled treatment session focused on completion of patient and family education. The patient, his parents and his brother were educated in regards to the patient's current cognitive functioning and strategies to utilize at home to maximize safety, initiation and overall independence with functional tasks, especially in regards to need for 24 hour supervision. All were also educated in regards to patient's current swallowing function, diet recommendations, appropriate textures, swallowing compensatory strategies and medication administration. All verbalized understanding and handouts also given to reinforce information. Patient will discharge home today with family.   Patient has met 3 of 9 long term goals.  Patient to discharge at [facility] Max;Mod level.   Reasons goals not met:   Pt's lack of participation and carryover of strategies   Clinical Impression/Discharge Summary:   Pt has made minimal progress this admission d/t decreased overall participation in skilled sessions even with therapy reduced to 15/7. Pt currently requires Max A to Mod A multimodal cues for all tasks inculding speech intelligibility, semi-complex problem solving, error awareness, recall and selective attention due to internal distractions. Pt is currently consuming dys 2 textures with thin liquids with continued mild right anterior spillage and pocketing requiring verbal cues to clear. Pt appears severely depressed and unknown information about any past psychiatric history, therefore it is difficult to determine pt's true cognitive function. Pt and family education complete. Patient to discharge home with family with 24 hour supervision and would benefit from f/u SLP  services in order to maximize functional independence and reduce burden of care.  Care Partner:  Caregiver Able to Provide Assistance: Yes  Type of Caregiver Assistance: Physical;Cognitive  Recommendation:  Home Health SLP  Rationale for SLP Follow Up: Maximize functional communication;Maximize swallowing safety;Maximize cognitive function and independence;Reduce caregiver burden   Equipment: N/A  Reasons for discharge: Discharged from hospital   Patient/Family Agrees with Progress Made and Goals Achieved: Yes    [place]  [facility] 02/05/2018, 12:33 PM

## 2018-02-05 NOTE — Progress Notes (Signed)
Occupational Therapy Session Note  Patient Details  Name: Caleb Hubbard MRN: [id number] Date of Birth: 10-01-1980  Today's Date: 02/05/2018 OT Individual Time: 9371-6967 OT Individual Time Calculation (min): 25 min    Short Term Goals: Week 3:  OT Short Term Goal 1 (Week 3): Pt will recall hemi dressing techniques with min VC OT Short Term Goal 2 (Week 3): Pt will thread BLE into pants with min A OT Short Term Goal 3 (Week 3): Pt will complete toileting (hygiene and clothing management) with min assist  Skilled Therapeutic Interventions/Progress Updates:    Session focused on bed mobility and LB dressing/bathing. Pt reported being wet and was provided with min A to doff pants/brief at bed level. Pt was given washcloth and cued for completing peri hygiene. Pt barely put washcloth on skin and handed back to therapist despite cueing x3 to complete thorough job. Brief was donned and then pt donned pants with mod A while supine. Pt completed rolling R with CGA and L with mod A for changing of linens. Pt refused any OOB mobility or further participation in therapy. Pt left supine with bed alarm set and all needs met. No indications of pain throughout session.   Therapy Documentation Precautions:  Precautions Precautions: Fall Precaution Comments: corretrack Restrictions Weight Bearing Restrictions: No Vital Signs: Therapy Vitals Temp: 98.5 F (36.9 C) Temp Source: Oral Pulse Rate: 75 Resp: 16 BP: 118/74 Patient Position (if appropriate): Lying Oxygen Therapy SpO2: 100 % O2 Device: Room Air Pain: Pain Assessment Pain Scale: Faces Faces Pain Scale: No hurt   Therapy/Group: Individual Therapy  Curtis Sites 02/05/2018, 8:45 AM

## 2018-02-05 NOTE — Progress Notes (Signed)
Nutrition Follow-up  DOCUMENTATION CODES:   Non-severe (moderate) malnutrition in context of chronic illness, Underweight  INTERVENTION:   - Continue Ensure Enlive po BID, each supplement provides 350 kcal and 20 grams of protein (this supplement is suitable for lactose intolerance)  - Continue MVI with minerals daily  - Continue Pro-stat 30 ml BID, each supplement provides 100 kcal and 15 grams of protein  - Snacks BID  NUTRITION DIAGNOSIS:   Moderate Malnutrition related to chronic illness (brain stem stroke with resulting dysphagia) as evidenced by mild fat depletion, mild muscle depletion, moderate muscle depletion, percent weight loss (10.5% weight loss in less than 1 month).  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  Now s/p 1 of 2 planned cycles of cisplatin / etoposide (12/19/17 through 01/01/18 at [facility]). 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  Noted pt needing maximum cues to participate in therapies. Per RN, pt still eating just bites of meals even when girlfriend or family brings restaurant food or home-cooked food. Suspect poor PO intake related in part to mood. Also suspect PO intake to increase after d/c. RD does not recommend nutrition support at this time. Recommend appetite stimulant.  Weight continues to trend down, pt with overall 12 lb weight loss since admission to CIR. Continues to meet criteria for malnutrition.  Meal Completion: 0-100% x last 8 meals, average ~31%  Medications reviewed and include: Ensure Enlive TID, Pro-stat BID, MVI with minerals, Protonix, Miralax  Labs  reviewed: hemoglobin 9.9 (L), BUN 22 (H)  Diet Order:   Diet Order            DIET DYS 2 Room service appropriate? Yes; Fluid consistency: Thin  Diet effective now              EDUCATION NEEDS:   Education needs have been addressed  Skin:  Skin Assessment: Reviewed RN Assessment  Last BM:  12/25 (large type 3)  Height:   Ht Readings from Last 1 Encounters:  01/15/18 5\' 11"  (1.803 m)    Weight:   Wt Readings from Last 1 Encounters:  02/05/18 54.4 kg    Ideal Body Weight:  78.1 kg  BMI:  Body mass index is 16.73 kg/m.  Estimated Nutritional Needs:   Kcal:  2100-2300  Protein:  100-115 grams  Fluid:  2.1-2.3 L    Gaynell Face, MS, RD, LDN Inpatient Clinical Dietitian Pager: [phone] Weekend/After Hours: [phone]

## 2018-02-05 NOTE — Progress Notes (Signed)
Speech Language Pathology Daily Session Note  Patient Details  Name: Daivd Fredericksen MRN: [id number] Date of Birth: [birth year]  Today's Date: 02/05/2018 SLP Individual Time: 1050-1115 SLP Individual Time Calculation (min): 25 min  Short Term Goals: Week 3: SLP Short Term Goal 1 (Week 3): Pt will utilize external memory aids to recall daily information with supervision cues.  SLP Short Term Goal 2 (Week 3): Pt will complex semi-complex problem solving tasks with Min A cues.  SLP Short Term Goal 3 (Week 3): Pt will demonstrate selective attention in mildly distracting environment for ~ 30 minutes with Min A cues.  SLP Short Term Goal 4 (Week 3): Pt will utilize speech intelligibility strategies to achieve ~ 90% speech intelligibility at the sentence level.  SLP Short Term Goal 5 (Week 3): Pt will consume current diet with minimal overt s/s of aspiration and supervision cues for use of compensatory strategies.   Skilled Therapeutic Interventions: Skilled ST services focused on cognitive skills. Pt required max A verbal cues to participate in therapy. SLP facilitated structured semi-complex organization task, pt required max A verbal cues for problem solving and error awareness, demonstrating little perceived effort during task. Pt was left with call bell within reach and bed alarm set.     Pain Pain Assessment Pain Scale: Faces Pain Score: 0-No pain Faces Pain Scale: No hurt  Therapy/Group: Individual Therapy  Deshannon Hinchliffe  [facility] 02/05/2018, 12:25 PM

## 2018-02-06 ENCOUNTER — Inpatient Hospital Stay (HOSPITAL_COMMUNITY): Payer: Medicaid Other | Admitting: Physical Therapy

## 2018-02-06 ENCOUNTER — Inpatient Hospital Stay (HOSPITAL_COMMUNITY): Payer: Medicaid Other | Admitting: Speech Pathology

## 2018-02-06 ENCOUNTER — Inpatient Hospital Stay (HOSPITAL_COMMUNITY): Payer: Medicaid Other

## 2018-02-06 DIAGNOSIS — E46 Unspecified protein-calorie malnutrition: Secondary | ICD-10-CM

## 2018-02-06 LAB — CBC
HEMATOCRIT: 32.2 % — AB (ref 39.0–52.0)
Hemoglobin: 10.7 g/dL — ABNORMAL LOW (ref 13.0–17.0)
MCH: 33.3 pg (ref 26.0–34.0)
MCHC: 33.2 g/dL (ref 30.0–36.0)
MCV: 100.3 fL — ABNORMAL HIGH (ref 80.0–100.0)
Platelets: 395 10*3/uL (ref 150–400)
RBC: 3.21 MIL/uL — ABNORMAL LOW (ref 4.22–5.81)
RDW: 15.4 % (ref 11.5–15.5)
WBC: 11.3 10*3/uL — ABNORMAL HIGH (ref 4.0–10.5)
nRBC: 0 % (ref 0.0–0.2)

## 2018-02-06 MED ORDER — MEGESTROL ACETATE 400 MG/10ML PO SUSP
400.0000 mg | Freq: Two times a day (BID) | ORAL | Status: DC
  Administered 2018-02-06 – 2018-02-09 (×7): 400 mg via ORAL
  Filled 2018-02-06 (×7): qty 10

## 2018-02-06 NOTE — Progress Notes (Addendum)
PHYSICAL MEDICINE & REHABILITATION PROGRESS NOTE   Subjective/Complaints:  Pt lying in bed. Still reacts minimally to verbal and tactile cueing for me. Intake poor  ROS: Limited due to cognitive/behavioral   Objective:   No results found. Recent Labs    02/04/18 0630 02/06/18 0517  WBC 12.2* 11.3*  HGB 9.9* 10.7*  HCT 31.0* 32.2*  PLT 412* 395   No results for input(s): NA, K, CL, CO2, GLUCOSE, BUN, CREATININE, CALCIUM in the last 72 hours.  Intake/Output Summary (Last 24 hours) at 02/06/2018 1005 Last data filed at 02/06/2018 0837 Gross per 24 hour  Intake 600 ml  Output -  Net 600 ml     Physical Exam: Vital Signs Blood pressure 99/85, pulse 66, temperature 99.4 F (37.4 C), temperature source Oral, resp. rate 18, height 5\' 11"  (1.803 m), weight 54.8 kg, SpO2 100 %.  Constitutional: No distress . Vital signs reviewed. HEENT: EOMI, oral membranes moist Neck: supple Cardiovascular: RRR without murmur. No JVD    Respiratory: CTA Bilaterally without wheezes or rales. Normal effort    GI: BS +, non-tender, non-distended  Skin: No evidence of breakdown, no evidence of rash Neurologic: Cranial nerves II through XII intact, motor strength is 4/5 in right deltoid, bicep, tricep, grip, hip flexor, knee extensors, ankle dorsiflexor and plantar flexor, 0/5 on left except 3+ at trap  Tone MAS 2/4 Left hamstrings and Left elbow flexors--no significant changes Sensory exam: withdraws to pain stim Musculoskeletal: Full range of motion in all 4 extremities. No joint swelling Psych: generally keeps eyes closed   Assessment/Plan: 1. 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  Physiatrist is providing close team supervision and 24 hour management of active medical problems listed below.  Physiatrist and  rehab team continue to assess barriers to discharge/monitor patient progress toward functional and medical goals  Care Tool:  Bathing  Bathing activity did not occur: Refused Body parts bathed by patient: Front perineal area   Body parts bathed by helper: Right lower leg, Left lower leg, Front perineal area, Buttocks Body parts n/a: Face   Bathing assist Assist Level: Minimal Assistance - Patient > 75%     Upper Body Dressing/Undressing Upper body dressing   What is the patient wearing?: Pull over shirt    Upper body assist Assist Level: Moderate Assistance - Patient 50 - 74%    Lower Body Dressing/Undressing Lower body dressing      What is the patient wearing?: Pants     Lower body assist Assist for lower body dressing: Moderate Assistance - Patient 50 - 74%     Toileting Toileting    Toileting assist Assist for toileting: Moderate Assistance - Patient 50 - 74%     Transfers Chair/bed transfer  Transfers assist  Chair/bed transfer activity did not occur: N/A  Chair/bed transfer assist level: Moderate Assistance - Patient 50 - 74%     Locomotion Ambulation   Ambulation assist   Ambulation activity did not occur: Safety/medical concerns  Assist level: Moderate Assistance - Patient 50 - 74% Assistive device: Other (comment)(hallway railing,LLE AFO) Max distance: 25   Walk 10 feet activity   Assist  Walk 10 feet activity did not occur: Safety/medical concerns  Assist level: Moderate Assistance - Patient - 50 - 74% Assistive device: Orthosis, Other (comment)(railing)   Walk 50 feet activity   Assist Walk  50 feet with 2 turns activity did not occur: Safety/medical concerns         Walk 150 feet activity   Assist Walk 150 feet activity did not occur: Safety/medical concerns         Walk 10 feet on uneven surface  activity   Assist Walk 10 feet on uneven surfaces activity did not occur: Safety/medical concerns          Wheelchair     Assist Will patient use wheelchair at discharge?: Yes Type of Wheelchair: Manual Wheelchair activity did not occur: Safety/medical concerns  Wheelchair assist level: Supervision/Verbal cueing Max wheelchair distance: 40 ft    Wheelchair 50 feet with 2 turns activity    Assist    Wheelchair 50 feet with 2 turns activity did not occur: Safety/medical concerns   Assist Level: Supervision/Verbal cueing   Wheelchair 150 feet activity     Assist Wheelchair 150 feet activity did not occur: Safety/medical concerns        Medical Problem List and Plan: 1. 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 DVT Prophylaxis/Anticoagulation: Pharmaceutical:Lovenox instead of heparin, to reduce needle sticks and increase efficacy in post stroke population 3. Pain Management:tylenol prn 4. Mood:appears very disengaged, suspect significant depression  -increased ritalin to 10mg  bid  -continue zoloft 25mg  qam. 5. Neuropsych: This patientis not fullycapable of making decisions on hisown behalf. 6. Skin/Wound Care:routine pressure relief measures. 7. Fluids/Electrolytes/Nutrition:Monitor I/O.  -intake remains poor, pt losing weight  -obviously cognitive and behavioral issues are driving   -will add megace to see if we can boost appetite  -check labs monday   8. GHW:EXHBZJI BID. Continue metoprolol and catapres. Vitals:   02/05/18 2003 02/06/18 0431  BP: (!) 116/94 99/85  Pulse: 75 66  Resp: 18 18  Temp: 99.7 F (37.6 C) 99.4 F (37.4 C)  SpO2: 100% 100%  controlled 12/27    9. Pancytopenia:Resolved Has resolved post Granix--d/c neutropenic precautions.Leukocytosis may be related to granix, also with thrombocytosis and anemia,appears to be trending down   -wbc's/ platelets trending down (11.3/395  12/27)  -hgb trending up 10.7  10. Prediabetes: Hgb A1c- 6.4. Monitor BS ac/hs and use SSI for elevated BS 11. Testicular cancer:Dr. Magrinatrecommends no further chemo and monitoring AFP/HCG every 6 months X 2 years.  12. Hypertriglyceridemia:On lipitor. 13. Depression: Was on Zyprexa for mood stabilization.   Neuropsych is following  -continue trazodone which has been beneficial for sleep.   -Sertraline 25mg  daily added on 12/17  -ritalin trial 14.  LUE and LLE limb pain non focal , likely CVA related and a combination of increased tone and neurogenic pain  - increased tizanidine to 4mg  qhs- sleeping better  - gabapentin 100mg  qhs-     LOS: 22 days A FACE TO FACE EVALUATION WAS PERFORMED  Meredith Staggers 02/06/2018, 10:05 AM

## 2018-02-06 NOTE — Progress Notes (Signed)
Enter pt room at 1312. Noted pt eating gummy bear while family was at bedside. Family educated on risk of aspiration.   Gummy bears initially taken away by visitors.  Will continue to reinforce teaching.  Gerald Stabs, LPN

## 2018-02-06 NOTE — Progress Notes (Signed)
Speech Language Pathology Daily Session Note  Patient Details  Name: Caleb Hubbard MRN: [id number] Date of Birth: 11/17/1980  Today's Date: 02/06/2018 SLP Individual Time: 0354-6568 SLP Individual Time Calculation (min): 18 min  Short Term Goals: Week 3: SLP Short Term Goal 1 (Week 3): Pt will utilize external memory aids to recall daily information with supervision cues.  SLP Short Term Goal 2 (Week 3): Pt will complex semi-complex problem solving tasks with Min A cues.  SLP Short Term Goal 3 (Week 3): Pt will demonstrate selective attention in mildly distracting environment for ~ 30 minutes with Min A cues.  SLP Short Term Goal 4 (Week 3): Pt will utilize speech intelligibility strategies to achieve ~ 90% speech intelligibility at the sentence level.  SLP Short Term Goal 5 (Week 3): Pt will consume current diet with minimal overt s/s of aspiration and supervision cues for use of compensatory strategies.   Skilled Therapeutic Interventions: Skilled treatment session focused on cognitive goals. Upon arrival, patient was awake while supine in bed and independently requested that his clinician help him change his clothes and brush his teeth.  SLP facilitated session by providing Mod A verbal cues for problem solving and safety during dressing tasks and overall Min A verbal cues for problem solving during basic grooming tasks. Patient was ~90% intelligible at the phrase and sentence level with Min A verbal cues needed for a slow rate of speech. Patient transferred back to bed at end of session. Patient left upright in bed with alarm on and all needs within reach. Continue with current plan of care.      Pain No/Denies Pain   Therapy/Group: Individual Therapy  [name] 02/06/2018, 3:35 PM

## 2018-02-06 NOTE — Progress Notes (Signed)
Occupational Therapy Note  Patient Details  Name: Undrea Shipes MRN: [id number] Date of Birth: 07-23-1980  Today's Date: 02/06/2018 OT Missed Time: 60 Minutes Missed Time Reason: Patient unwilling/refused to participate without medical reason  Pt missed 30 min skilled OT d/t refusal. Pt declines any intervention shaking head 'no' despite bed level, EOB and OOB options. Pt left seated in bed, call light reach and all needs met. Will follow up as available.   Lowella Dell Gae Bihl 02/06/2018, 10:11 AM

## 2018-02-06 NOTE — Progress Notes (Signed)
Social Work Patient ID: Caleb Hubbard, male   DOB: April 26, 1980, 37 y.o.   MRN: [id number] Spoke with Mom via telephone to confirm family education on Monday form 9-12 and discharge after. Made aware he requires 24 hr care and his refusal to participate in therapies.

## 2018-02-06 NOTE — Plan of Care (Signed)
  Problem: Spiritual Needs Goal: Ability to function at adequate level 02/06/2018 1648 by Gerald Stabs, LPN Outcome: Progressing 02/06/2018 1645 by Gerald Stabs, LPN Outcome: Progressing   Problem: Consults Goal: [facility] STROKE PATIENT EDUCATION Description See Patient Education module for education specifics  02/06/2018 1648 by Gerald Stabs, LPN Outcome: Progressing 02/06/2018 1645 by Gerald Stabs, LPN Outcome: Progressing Goal: Nutrition Consult-if indicated 02/06/2018 1648 by Gerald Stabs, LPN Outcome: Progressing 02/06/2018 1645 by Gerald Stabs, LPN Outcome: Progressing   Problem: RH BOWEL ELIMINATION Goal: RH STG MANAGE BOWEL WITH ASSISTANCE Description STG Manage Bowel with min Assistance.  02/06/2018 1648 by Gerald Stabs, LPN Outcome: Progressing 02/06/2018 1645 by Gerald Stabs, LPN Outcome: Progressing Goal: RH STG MANAGE BOWEL W/MEDICATION W/ASSISTANCE Description STG Manage Bowel with Medication with min Assistance.  02/06/2018 1648 by Gerald Stabs, LPN Outcome: Progressing 02/06/2018 1645 by Gerald Stabs, LPN Outcome: Progressing   Problem: RH SKIN INTEGRITY Goal: RH STG SKIN FREE OF INFECTION/BREAKDOWN Description Patients skin will remain free from further infection or breakdown with mod assist.   02/06/2018 1648 by Gerald Stabs, LPN Outcome: Progressing 02/06/2018 1645 by Gerald Stabs, LPN Outcome: Progressing Goal: RH STG MAINTAIN SKIN INTEGRITY WITH ASSISTANCE Description STG Maintain Skin Integrity With mod Assistance.  02/06/2018 1648 by Gerald Stabs, LPN Outcome: Progressing 02/06/2018 1645 by Gerald Stabs, LPN Outcome: Progressing   Problem: RH SAFETY Goal: RH STG ADHERE TO SAFETY PRECAUTIONS W/ASSISTANCE/DEVICE Description STG Adhere to Safety Precautions With min Assistance/Device.  02/06/2018 1648 by Gerald Stabs, LPN Outcome: Progressing 02/06/2018 1645 by Gerald Stabs, LPN Outcome:  Progressing   Problem: RH KNOWLEDGE DEFICIT Goal: RH STG INCREASE KNOWLEDGE OF HYPERTENSION Description Min assist  02/06/2018 1648 by Gerald Stabs, LPN Outcome: Progressing 02/06/2018 1645 by Gerald Stabs, LPN Outcome: Progressing Goal: RH STG INCREASE KNOWLEDGE OF DYSPHAGIA/FLUID INTAKE Description Min assist  02/06/2018 1648 by Gerald Stabs, LPN Outcome: Progressing 02/06/2018 1645 by Gerald Stabs, LPN Outcome: Progressing Goal: RH STG INCREASE KNOWLEGDE OF HYPERLIPIDEMIA Description Min assist  02/06/2018 1648 by Gerald Stabs, LPN Outcome: Progressing 02/06/2018 1645 by Gerald Stabs, LPN Outcome: Progressing Goal: RH STG INCREASE KNOWLEDGE OF STROKE PROPHYLAXIS Description Min assist  02/06/2018 1648 by Gerald Stabs, LPN Outcome: Progressing 02/06/2018 1645 by Gerald Stabs, LPN Outcome: Progressing

## 2018-02-06 NOTE — Progress Notes (Signed)
Family  stated that pt wanted to eat gummy bears. Pt is currently on D2 diet. Family advised not to give gummy bear due to risks of choking. Family states understanding. Continue plan of care.   Caleb Hubbard W Amiree No

## 2018-02-06 NOTE — Progress Notes (Addendum)
Physical Therapy Session Note  Patient Details  Name: Caleb Hubbard MRN: [id number] Date of Birth: 1980/02/14  Today's Date: 02/06/2018 PT Individual Time: 5885-0277 PT Individual Time Calculation (min): 8 min   Short Term Goals: Week 4:  PT Short Term Goal 1 (Week 4): STG = LTG due to estimated d/c date.   Skilled Therapeutic Interventions/Progress Updates:  Pt received in bed with RN in room. Pt reports pain in L side of body but does not rate & declines pain meds. Pt declines participation in therapy, reluctantly stating "Last day. I go home Monday". Pt eventually reports "wet" but declines transferring to standing to don new brief and reports his mother will change him in bed. Pt rolls L<>R with supervision & bed rails to allow therapist to doff soiled & don clean brief total assist. Pt washed peri area with cloth with cuing & set up assist. Pt bridges well and assists with pulling pants over hips. Pt declines further participation. Pt left in bed with alarm set, call bell in reach.   Therapy Documentation Precautions:  Precautions Precautions: Fall Precaution Comments: corretrack Restrictions Weight Bearing Restrictions: No General: PT Amount of Missed Time (min): 22 Minutes PT Missed Treatment Reason: Patient unwilling to participate   Therapy/Group: Individual Therapy  [name] 02/06/2018, 9:20 AM

## 2018-02-07 ENCOUNTER — Inpatient Hospital Stay (HOSPITAL_COMMUNITY): Payer: Medicaid Other | Admitting: Occupational Therapy

## 2018-02-07 DIAGNOSIS — I10 Essential (primary) hypertension: Secondary | ICD-10-CM

## 2018-02-07 DIAGNOSIS — G8110 Spastic hemiplegia affecting unspecified side: Secondary | ICD-10-CM

## 2018-02-07 NOTE — Progress Notes (Signed)
Occupational Therapy Session Note  Patient Details  Name: Caleb Hubbard MRN: [id number] Date of Birth: 01-12-1981  Today's Date: 02/07/2018 OT Missed Time: 42 Minutes Missed Time Reason: Patient unwilling/refused to participate without medical reason  Skilled Therapeutic Interventions/Progress Updates:    Pt greeted in bed with family present. Encouraged him to engage in ADLs or other therapeutic activities, however pt refused. 30 minutes missed.     Therapy Documentation Precautions:  Precautions Precautions: Fall Precaution Comments: corretrack Restrictions Weight Bearing Restrictions: No Vital Signs: Therapy Vitals Pulse Rate: 81 Resp: 17 BP: 110/84 Patient Position (if appropriate): Lying Oxygen Therapy SpO2: 100 % O2 Device: Room Air Pain: Pt denied pain   ADL: ADL Eating: Moderate cueing Where Assessed-Eating: Wheelchair Upper Body Bathing: Minimal assistance Where Assessed-Upper Body Bathing: Sitting at sink Lower Body Bathing: Moderate assistance Where Assessed-Lower Body Bathing: Wheelchair Upper Body Dressing: Moderate assistance Lower Body Dressing: Maximal assistance Toileting: Maximal assistance Where Assessed-Toileting: Glass blower/designer: Moderate assistance Toilet Transfer Equipment: Grab bars, Raised toilet seat:     Therapy/Group: Individual Therapy  Trampas Stettner A Jolanda Mccann 02/07/2018, 3:43 PM

## 2018-02-07 NOTE — Progress Notes (Signed)
[place] PHYSICAL MEDICINE & REHABILITATION PROGRESS NOTE   Subjective/Complaints: Patient seen laying in bed this morning.  No reported issues overnight.  ROS: Limited due to cognition  Objective:   No results found. Recent Labs    02/06/18 0517  WBC 11.3*  HGB 10.7*  HCT 32.2*  PLT 395   No results for input(s): NA, K, CL, CO2, GLUCOSE, BUN, CREATININE, CALCIUM in the last 72 hours.  Intake/Output Summary (Last 24 hours) at 02/07/2018 0837 Last data filed at 02/06/2018 1700 Gross per 24 hour  Intake 440 ml  Output -  Net 440 ml     Physical Exam: Vital Signs Blood pressure 121/81, pulse 86, temperature 98.4 F (36.9 C), temperature source Oral, resp. rate 18, height 5\' 11"  (1.803 m), weight 56.6 kg, SpO2 100 %.  Constitutional: No distress . Vital signs reviewed. HENT: Normocephalic.  Atraumatic. Eyes: EOMI. No discharge. Cardiovascular: RRR. No JVD. Respiratory: CTA Bilaterally. Normal effort. GI: BS +. Non-distended. Musc: No edema or tenderness in extremities. Skin: No evidence of breakdown, no evidence of rash Neurologic:  Motor: Limited due to participation ?4/5 in right deltoid, bicep, tricep, grip, hip flexor, knee extensors, ankle dorsiflexor and plantar flexor ? 0/5 LUE/LLE Tone MAS 2/4 Left hamstrings and Left elbow flexors Psych: Flat.  Keeps eyes closed.  Limited engagement  Assessment/Plan: 1. 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  Physiatrist is providing close team supervision and 24 hour management of active medical problems listed below.  Physiatrist and rehab team continue to assess barriers to discharge/monitor patient progress toward functional and medical goals  Care Tool:  Bathing  Bathing activity did not occur: Refused Body parts bathed by patient: Front  perineal area   Body parts bathed by helper: Right lower leg, Left lower leg, Front perineal area, Buttocks Body parts n/a: Face   Bathing assist Assist Level: Minimal Assistance - Patient > 75%     Upper Body Dressing/Undressing Upper body dressing   What is the patient wearing?: Pull over shirt    Upper body assist Assist Level: Moderate Assistance - Patient 50 - 74%    Lower Body Dressing/Undressing Lower body dressing      What is the patient wearing?: Pants     Lower body assist Assist for lower body dressing: Moderate Assistance - Patient 50 - 74%     Toileting Toileting    Toileting assist Assist for toileting: Moderate Assistance - Patient 50 - 74%     Transfers Chair/bed transfer  Transfers assist  Chair/bed transfer activity did not occur: N/A  Chair/bed transfer assist level: Moderate Assistance - Patient 50 - 74%     Locomotion Ambulation   Ambulation assist   Ambulation activity did not occur: Safety/medical concerns  Assist level: Moderate Assistance - Patient 50 - 74% Assistive device: Other (comment)(hallway railing,LLE AFO) Max distance: 25   Walk 10 feet activity   Assist  Walk 10 feet activity did not occur: Safety/medical concerns  Assist level: Moderate Assistance - Patient - 50 - 74% Assistive device: Orthosis, Other (comment)(railing)   Walk 50 feet activity   Assist Walk 50 feet with 2 turns activity did not occur: Safety/medical concerns         Walk 150 feet activity   Assist Walk 150 feet activity did not occur: Safety/medical concerns  Walk 10 feet on uneven surface  activity   Assist Walk 10 feet on uneven surfaces activity did not occur: Safety/medical concerns         Wheelchair     Assist Will patient use wheelchair at discharge?: Yes Type of Wheelchair: Manual Wheelchair activity did not occur: Safety/medical concerns  Wheelchair assist level: Supervision/Verbal cueing Max  wheelchair distance: 40 ft    Wheelchair 50 feet with 2 turns activity    Assist    Wheelchair 50 feet with 2 turns activity did not occur: Safety/medical concerns   Assist Level: Supervision/Verbal cueing   Wheelchair 150 feet activity     Assist Wheelchair 150 feet activity did not occur: Safety/medical concerns        Medical Problem List and Plan: 1. 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 DVT Prophylaxis/Anticoagulation: Pharmaceutical:Lovenox instead of heparin, to reduce needle sticks and increase efficacy in post stroke population 3. Pain Management:tylenol prn 4. Mood:appears very disengaged, suspect significant depression  -increased ritalin to 10mg  bid  -continue zoloft 25mg  qam. 5. Neuropsych: This patientis not capable of making decisions on hisown behalf. 6. Skin/Wound Care:routine pressure relief measures. 7. Fluids/Electrolytes/Nutrition:Monitor I/O.  Labile intake  -obviously cognitive and behavioral issues are driving   Added Megace to see if we can boost appetite  -check labs Monday 8. QQP:YPPJKDT BID. Continue metoprolol and catapres. Vitals:   02/06/18 2011 02/07/18 0413  BP: (!) 110/93 121/81  Pulse: 88 86  Resp: 17 18  Temp: 98.9 F (37.2 C) 98.4 F (36.9 C)  SpO2: 100% 100%   Controlled on 12/28 9. Pancytopenia:Resolved Has resolved post Granix--d/c neutropenic precautions.Leukocytosis may be related to granix, also with thrombocytosis and anemia,appears to be trending down   -wbc's/ platelets trending down 11.3 on 12/27  -hgb trending up 10.7 on 12/27  10. Prediabetes: Hgb A1c- 6.4. Monitor BS ac/hs and use SSI for elevated BS 11. Testicular cancer:Dr. Magrinatrecommends no further chemo and  monitoring AFP/HCG every 6 months X 2 years.  12. Hypertriglyceridemia:On lipitor. 13. Depression: Was on Zyprexa for mood stabilization.   Neuropsych is following  -continue trazodone which has been beneficial for sleep.   -Sertraline 25mg  daily added on 12/17  -ritalin trial 14.  LUE and LLE limb pain non focal , likely CVA related and a combination of increased tone and neurogenic pain  - increased tizanidine to 4mg  qhs- sleeping better  - gabapentin 100mg  qhs     LOS: 23 days A FACE TO FACE EVALUATION WAS PERFORMED  Caleb Hubbard Lorie Phenix 02/07/2018, 8:37 AM

## 2018-02-08 DIAGNOSIS — G479 Sleep disorder, unspecified: Secondary | ICD-10-CM

## 2018-02-08 DIAGNOSIS — E639 Nutritional deficiency, unspecified: Secondary | ICD-10-CM

## 2018-02-08 NOTE — Progress Notes (Signed)
PHYSICAL MEDICINE & REHABILITATION PROGRESS NOTE   Subjective/Complaints: Patient seen laying in bed this morning.  He nods he slept well overnight.  He slept fairly well per sleep chart.  He is nonverbal.  No reported issues overnight.  ROS: Limited due to cognition  Objective:   No results found. Recent Labs    02/06/18 0517  WBC 11.3*  HGB 10.7*  HCT 32.2*  PLT 395   No results for input(s): NA, K, CL, CO2, GLUCOSE, BUN, CREATININE, CALCIUM in the last 72 hours.  Intake/Output Summary (Last 24 hours) at 02/08/2018 0751 Last data filed at 02/07/2018 1700 Gross per 24 hour  Intake 400 ml  Output -  Net 400 ml     Physical Exam: Vital Signs Blood pressure 110/76, pulse 67, temperature 98.3 F (36.8 C), temperature source Oral, resp. rate 15, height 5\' 11"  (1.803 m), weight 59.6 kg, SpO2 100 %.  Constitutional: No distress . Vital signs reviewed. HENT: Normocephalic.  Atraumatic. Eyes: EOMI.no discharge. Cardiovascular: RRR.  No JVD. Respiratory: CTA bilaterally.  Normal effort. GI: BS +. Non-distended. Musc: No edema or tenderness in extremities. Skin: No evidence of breakdown, no evidence of rash Neurologic:  Motor: Limited due to participation ? 4/5 in right deltoid, bicep, tricep, grip, hip flexor, knee extensors, ankle dorsiflexor and plantar flexor ? 0/5 LUE/LLE Tone MAS 2/4 Left hamstrings and Left elbow flexors Psych: Flat.   Assessment/Plan: 1. 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  Physiatrist is providing close team supervision and 24 hour management of active medical problems listed below.  Physiatrist and rehab team continue to assess barriers to discharge/monitor patient progress toward functional and medical goals  Care Tool:  Bathing  Bathing activity did not  occur: Refused Body parts bathed by patient: Front perineal area   Body parts bathed by helper: Right lower leg, Left lower leg, Front perineal area, Buttocks Body parts n/a: Face   Bathing assist Assist Level: Minimal Assistance - Patient > 75%     Upper Body Dressing/Undressing Upper body dressing   What is the patient wearing?: Pull over shirt    Upper body assist Assist Level: Moderate Assistance - Patient 50 - 74%    Lower Body Dressing/Undressing Lower body dressing      What is the patient wearing?: Pants     Lower body assist Assist for lower body dressing: Moderate Assistance - Patient 50 - 74%     Toileting Toileting    Toileting assist Assist for toileting: Moderate Assistance - Patient 50 - 74%     Transfers Chair/bed transfer  Transfers assist  Chair/bed transfer activity did not occur: N/A  Chair/bed transfer assist level: Moderate Assistance - Patient 50 - 74%     Locomotion Ambulation   Ambulation assist   Ambulation activity did not occur: Safety/medical concerns  Assist level: Moderate Assistance - Patient 50 - 74% Assistive device: Other (comment)(hallway railing,LLE AFO) Max distance: 25   Walk 10 feet activity   Assist  Walk 10 feet activity did not occur: Safety/medical concerns  Assist level: Moderate Assistance - Patient - 50 - 74% Assistive device: Orthosis, Other (comment)(railing)   Walk 50 feet activity   Assist Walk 50 feet with 2 turns activity did not occur: Safety/medical concerns         Walk 150 feet activity   Assist Walk 150  feet activity did not occur: Safety/medical concerns         Walk 10 feet on uneven surface  activity   Assist Walk 10 feet on uneven surfaces activity did not occur: Safety/medical concerns         Wheelchair     Assist Will patient use wheelchair at discharge?: Yes Type of Wheelchair: Manual Wheelchair activity did not occur: Safety/medical concerns  Wheelchair  assist level: Supervision/Verbal cueing Max wheelchair distance: 40 ft    Wheelchair 50 feet with 2 turns activity    Assist    Wheelchair 50 feet with 2 turns activity did not occur: Safety/medical concerns   Assist Level: Supervision/Verbal cueing   Wheelchair 150 feet activity     Assist Wheelchair 150 feet activity did not occur: Safety/medical concerns        Medical Problem List and Plan: 1. 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 DVT Prophylaxis/Anticoagulation: Pharmaceutical:Lovenox instead of heparin, to reduce needle sticks and increase efficacy in post stroke population 3. Pain Management:tylenol prn 4. Mood:appears very disengaged, suspect significant depression  -increased ritalin to 10mg  bid  -continue zoloft 25mg  qam. 5. Neuropsych: This patientis not capable of making decisions on hisown behalf. 6. Skin/Wound Care:routine pressure relief measures. 7. Fluids/Electrolytes/Nutrition:Monitor I/O.  Labile intake on 12/29  -obviously cognitive and behavioral issues are driving   Added Megace to see if we can boost appetite  -check labs tomorrow 8. RDE:YCXKGYJ BID. Continue metoprolol and catapres. Vitals:   02/07/18 2008 02/08/18 0512  BP: 112/85 110/76  Pulse: 85 67  Resp: 15 15  Temp: 99.3 F (37.4 C) 98.3 F (36.8 C)  SpO2: 100% 100%   Controlled on 12/29 9. Pancytopenia:Resolved Has resolved post Granix--d/c neutropenic precautions.Leukocytosis may be related to granix, also with thrombocytosis and anemia,appears to be trending down   -wbc's/ platelets trending down 11.3 on 12/27  -hgb trending up 10.7 on 12/27  10. Prediabetes: Hgb A1c- 6.4. Monitor BS ac/hs and use SSI for elevated BS 11. Testicular cancer:Dr. Magrinatrecommends no further chemo and monitoring AFP/HCG every  6 months X 2 years.  12. Hypertriglyceridemia:On lipitor. 13. Depression: Was on Zyprexa for mood stabilization.   Neuropsych is following  -continue trazodone which has been beneficial for sleep.   -Sertraline 25mg  daily added on 12/17  -ritalin trial 14.  LUE and LLE limb pain non focal , likely CVA related and a combination of increased tone and neurogenic pain  - increased tizanidine to 4mg  qhs- sleeping better  - gabapentin 100mg  qhs     LOS: 24 days A FACE TO FACE EVALUATION WAS PERFORMED  Ankit Lorie Phenix 02/08/2018, 7:51 AM

## 2018-02-09 ENCOUNTER — Ambulatory Visit (HOSPITAL_COMMUNITY): Payer: Medicaid Other

## 2018-02-09 ENCOUNTER — Encounter (HOSPITAL_COMMUNITY): Payer: Medicaid Other | Admitting: Occupational Therapy

## 2018-02-09 ENCOUNTER — Encounter (HOSPITAL_COMMUNITY): Payer: Medicaid Other | Admitting: Speech Pathology

## 2018-02-09 LAB — COMPREHENSIVE METABOLIC PANEL
ALT: 17 U/L (ref 0–44)
AST: 16 U/L (ref 15–41)
Albumin: 3.9 g/dL (ref 3.5–5.0)
Alkaline Phosphatase: 79 U/L (ref 38–126)
Anion gap: 9 (ref 5–15)
BUN: 21 mg/dL — ABNORMAL HIGH (ref 6–20)
CO2: 25 mmol/L (ref 22–32)
Calcium: 9.9 mg/dL (ref 8.9–10.3)
Chloride: 103 mmol/L (ref 98–111)
Creatinine, Ser: 1.13 mg/dL (ref 0.61–1.24)
GFR calc Af Amer: >60 mL/min (ref >60)
GFR calc non Af Amer: >60 mL/min (ref >60)
Glucose, Bld: 102 mg/dL — ABNORMAL HIGH (ref 70–99)
Potassium: 4.4 mmol/L (ref 3.5–5.1)
Sodium: 137 mmol/L (ref 135–145)
Total Bilirubin: 0.4 mg/dL (ref 0.3–1.2)
Total Protein: 6.9 g/dL (ref 6.5–8.1)

## 2018-02-09 LAB — CBC
HCT: 31.5 % — ABNORMAL LOW (ref 39.0–52.0)
HEMOGLOBIN: 10.5 g/dL — AB (ref 13.0–17.0)
MCH: 33 pg (ref 26.0–34.0)
MCHC: 33.3 g/dL (ref 30.0–36.0)
MCV: 99.1 fL (ref 80.0–100.0)
Platelets: 366 10*3/uL (ref 150–400)
RBC: 3.18 MIL/uL — ABNORMAL LOW (ref 4.22–5.81)
RDW: 15 % (ref 11.5–15.5)
WBC: 12.3 10*3/uL — ABNORMAL HIGH (ref 4.0–10.5)
nRBC: 0 % (ref 0.0–0.2)

## 2018-02-09 LAB — PREALBUMIN: Prealbumin: 33.1 mg/dL (ref 18–38)

## 2018-02-09 MED ORDER — TICAGRELOR 90 MG PO TABS: 90.0000 mg | ORAL_TABLET | Freq: Two times a day (BID) | ORAL | 1 refills | Status: DC

## 2018-02-09 MED ORDER — PANTOPRAZOLE SODIUM 40 MG PO TBEC: 40.0000 mg | DELAYED_RELEASE_TABLET | Freq: Every day | ORAL | 0 refills | Status: DC

## 2018-02-09 MED ORDER — TIZANIDINE HCL 4 MG PO TABS: 4.0000 mg | ORAL_TABLET | Freq: Every day | ORAL | 0 refills | Status: DC

## 2018-02-09 MED ORDER — CLONIDINE HCL 0.1 MG PO TABS: 0.1000 mg | ORAL_TABLET | Freq: Every day | ORAL | 11 refills | Status: DC

## 2018-02-09 MED ORDER — ATORVASTATIN CALCIUM 80 MG PO TABS: 80.0000 mg | ORAL_TABLET | Freq: Every day | ORAL | 1 refills | Status: DC

## 2018-02-09 MED ORDER — SERTRALINE HCL 50 MG PO TABS: 50.0000 mg | ORAL_TABLET | Freq: Every day | ORAL | 1 refills | Status: DC

## 2018-02-09 MED ORDER — SERTRALINE HCL 25 MG PO TABS: 25.0000 mg | ORAL_TABLET | Freq: Every day | ORAL | 1 refills | Status: DC

## 2018-02-09 MED ORDER — TRAZODONE HCL 100 MG PO TABS: 100.0000 mg | ORAL_TABLET | Freq: Every day | ORAL | 0 refills | Status: DC

## 2018-02-09 MED ORDER — POLYETHYLENE GLYCOL 3350 17 G PO PACK: 17.0000 g | PACK | Freq: Every day | ORAL | 0 refills | Status: DC

## 2018-02-09 MED ORDER — GABAPENTIN 100 MG PO CAPS: 100.0000 mg | ORAL_CAPSULE | Freq: Every day | ORAL | 1 refills | Status: DC

## 2018-02-09 MED ORDER — MEGESTROL ACETATE 400 MG/10ML PO SUSP: 400.0000 mg | Freq: Two times a day (BID) | ORAL | 0 refills | Status: DC

## 2018-02-09 MED ORDER — SERTRALINE HCL 50 MG PO TABS: 50.0000 mg | ORAL_TABLET | Freq: Every day | ORAL | Status: DC

## 2018-02-09 MED ORDER — METOPROLOL TARTRATE 25 MG PO TABS: 25.0000 mg | ORAL_TABLET | Freq: Two times a day (BID) | ORAL | 1 refills | Status: DC

## 2018-02-09 MED ORDER — ADULT MULTIVITAMIN W/MINERALS CH: 1.0000 | ORAL_TABLET | Freq: Every day | ORAL | Status: AC

## 2018-02-09 NOTE — Progress Notes (Signed)
Social Work  Discharge Note  The overall goal for the admission was met for:   Discharge location: Yes-HOME TO [place] ARE AWARE HE WILL NEED 24 HR CARE  Length of Stay: Yes-25 DAYS  Discharge activity level: Yes-SUPERVISION-MIN ASSIST  Home/community participation: Yes  Services provided included: MD, RD, PT, OT, SLP, RN, TR, Pharmacy, Neuropsych and SW  Financial Services: Other: PENDING MEDICAID  Follow-up services arranged: Home Health: [place], DME: [facility], 3 IN[facility], [facility] and Patient/Family has no preference for HH/DME agencies  Comments (or additional information):PARENTS HERE TO [place]. MATCH GIVEN TO PARENTS FOR PRESCRIPTION ASSIST, APPLIED FOR MEDICAID AND DISABILITY BOTH ARE PENDING-TRACEY MATTHEWS FINANCIAL COUNSELOR IS FOLLOW ING  Patient/Family verbalized understanding of follow-up arrangements: Yes  Individual responsible for coordination of the follow-up plan: PARENTS  Confirmed correct DME delivered: Elease Hashimoto 02/09/2018    Elease Hashimoto

## 2018-02-09 NOTE — Progress Notes (Addendum)
Physical Therapy Discharge Summary  Patient Details  Name: Caleb Hubbard MRN: [id number] Date of Birth: [birth year]  Today's Date: 02/09/2018 PT Individual Time:1000  - 1100, 60 min     Patient has met 0 of 6 long term goals due to limited motor return, limited participation in PT while at [facility].  Patient to discharge at a wheelchair level Mod Assist for transfers.   Patient's care partner is independent to provide the necessary physical and cognitive assistance at discharge.  Reasons goals not met: pt was deeply depressed and participation was very limited while at CIR  Recommendation:  Patient will benefit from ongoing skilled PT services in home health setting to continue to advance safe functional mobility, address ongoing impairments in motor control, balance, activity tolerance, and minimize fall risk.  If motivated, pt has the potential to ambulate with LLE orthosis and HW.   Equipment: 16 x 16 basic w/c with cushion  Reasons for discharge: discharge from hospital  Patient/family agrees with progress made and goals achieved: Yes  PT Discharge  Mother and father here for family ed.  Mother participated in basic and simulated truck transfer (to high seat), bed mobililty, w/c parts mgt and w/c propulsion.  Mother understands that pt is not to ambulate with family yet; HHPT will work with him and train them when appropriate.  Pt noted to be wet with urine, although wearing a diaper.  Father assisted pt in set-up for doffing clothes in bed, cleansing himself with damp towel with max cues.  Pt bridged up in hook lying position(with min assist from father to stabilize L knee), and diaper placed by PT with father observing.  Father assisted pt in donning clean pants and shirt.  PT urged parents to give positive feedback to pt for appropriate behavior and motivation.  Gait training with PT, with L AFO, hallway railing R hand, x 25' with mod> max assist for L foot placement, L LE stance stability,  upright trunk.  Pt left resting in w/c with seat belt alarm in place.  Pt perseverating on getting food from [place].  Loma Sousa, SLP to educate parents next on cognition and safe diet textures for pt.  Addendum: PT later spoke to pt's father regarding home entry.  They have not installed a ramp.  There are 3 STE, no rail.  PT discussed bumping pt up in w/c; father is familiar with this and was able to describe how to do this safely.  Precautions/Restrictions Precautions Precautions: Fall Restrictions Weight Bearing Restrictions: No    Pain Pain Assessment Pain Scale: 0-10 Pain Score: 0-No pain Vision/Perception  Vision - Assessment Additional Comments: reports improvements in double vision.  Pt did not allow therapist to assess vision upon d/c due to decreased participation Perception Perception: Impaired(inattention L side of body)  Cognition Overall Cognitive Status: Impaired/Different from baseline Arousal/Alertness: Awake/alert Orientation Level: Oriented X4 Memory: Impaired Awareness: Impaired Problem Solving: Impaired Reasoning: Impaired Behaviors: Restless;Impulsive Safety/Judgment: Impaired Sensation Sensation Light Touch: Appears Intact Proprioception: Impaired by gross assessment(impaired in LLE) Coordination Gross Motor Movements are Fluid and Coordinated: No Fine Motor Movements are Fluid and Coordinated: No Coordination and Movement Description: limited by L hemiplegia Finger Nose Finger Test: flaccid LUE Heel Shin Test: unable to assess L to R Motor  Motor Motor: Hemiplegia Motor - Skilled Clinical Observations: dense L hemiplegia Motor - Discharge Observations: some active motors L hip and knee  Mobility Bed Mobility Rolling Right: Supervision/verbal cueing Rolling Left: Supervision/Verbal cueing Supine to Sit: Minimal Assistance -  Patient > 75% Sit to Supine: Minimal Assistance - Patient > 75% Transfers Sit to Stand: Minimal Assistance - Patient >  75% Stand Pivot Transfers: Moderate Assistance - Patient 50 - 74% Stand Pivot Transfer Details: Verbal cues for technique;Verbal cues for precautions/safety;Verbal cues for sequencing Stand Pivot Transfer Details (indicate cue type and reason): block L knee Transfer (Assistive device): None Locomotion  Gait Ambulation: Yes Gait Assistance: Maximal Assistance - Patient 25-49% Gait Distance (Feet): 25 Feet Assistive device: Other (Comment)(railing in hallway, r hand) Gait Gait: Yes Gait Pattern: Left flexed knee in stance;Trunk flexed;Decreased hip/knee flexion - left;Decreased dorsiflexion - left;Step-through pattern Stairs / Additional Locomotion Stairs: No Wheelchair Mobility Wheelchair Mobility: Yes Wheelchair Assistance: Chartered loss adjuster: Right upper extremity;Right lower extremity Wheelchair Parts Management: Needs assistance Distance: 50  Trunk/Postural Assessment  Cervical Assessment Cervical Assessment: Within Functional Limits Thoracic Assessment Thoracic Assessment: Within Functional Limits Lumbar Assessment Lumbar Assessment: Exceptions to WFL(posterior pelvic tilt) Postural Control Postural Control: Deficits on evaluation(delayed/absent) Righting Reactions: delayed for drifting to L  Balance Balance Balance Assessed: Yes Static Sitting Balance Static Sitting - Balance Support: Bilateral upper extremity supported;Feet supported Static Sitting - Level of Assistance: 5: Stand by assistance Dynamic Sitting Balance Dynamic Sitting - Balance Support: Bilateral upper extremity supported;Feet supported;During functional activity Dynamic Sitting - Level of Assistance: 5: Stand by assistance;4: Min assist Static Standing Balance Static Standing - Balance Support: Right upper extremity supported;During functional activity Static Standing - Level of Assistance: 3: Mod assist Dynamic Standing Balance Dynamic Standing - Balance Support: Right  upper extremity supported;During functional activity Dynamic Standing - Level of Assistance: 3: Mod assist;2: Max assist Dynamic Standing - Balance Activities: Reaching for objects Extremity Assessment  RLE Assessment RLE Assessment: Within Functional Limits LLE Assessment LLE Assessment: Exceptions to [facility] General Strength Comments: seen functionally: 3-/5 hip flexion, 2-/5 knee extension, 0/5 ankle    Searcy Miyoshi 02/09/2018, 5:25 PM

## 2018-02-09 NOTE — Progress Notes (Signed)
Occupational Therapy Discharge Summary  Patient Details  Name: Caleb Hubbard MRN: [id number] Date of Birth: May 06, 1980  Patient has met 7 of 13 long term goals due to improved activity tolerance, improved balance, postural control, ability to compensate for deficits and improved awareness.  Patient to discharge at [facility] Assist level.  Patient's care partner is independent to provide the necessary physical and cognitive assistance at discharge.  Patient's mother and father attended family education and have completed hands on training for transfers. Pt refused any self-care tasks of bathing or dressing, therefore therapist educated pt's family about hemi-technique with bathing and dressing both seated at EOB or at bed level.  Therapist provided min cues to pt's father and mother for setup prior to transfers to ensure pt and caregiver safety.  Pt continues to demonstrate depressed mood and decreased participation or motivation to complete any functional tasks.    Reasons goals not met: Pt continues to demonstrate depressed mood and decreased participation or motivation to complete any functional tasks.   Pt requires mod assist for LB bathing and dressing and min- mod assist for transfers dependent on fluctuating pt participation and motivation.  Therapist trained pt's family members to provide constant hands on assistance with self-care tasks and transfers and to provide encouragement as appropriate but to also be prepared to provide up to mod assist level.  Recommendation:  Patient will benefit from ongoing skilled OT services in home health setting to continue to advance functional skills in the area of BADL and Reduce care partner burden.  Equipment: 3 in 1 and tub bench  Reasons for discharge: discharge from hospital  Patient/family agrees with progress made and goals achieved: Yes  OT Discharge Precautions/Restrictions  Precautions Precautions: Fall Pain Pain Assessment Pain Scale:  0-10 Pain Score: 0-No pain ADL ADL Eating: Minimal cueing, Supervision/safety Where Assessed-Eating: Edge of bed Grooming: Supervision/safety, Minimal cueing Where Assessed-Grooming: Sitting at sink Upper Body Bathing: Minimal assistance Where Assessed-Upper Body Bathing: Shower Lower Body Bathing: Minimal assistance Where Assessed-Lower Body Bathing: Shower Upper Body Dressing: Minimal assistance Where Assessed-Upper Body Dressing: Edge of bed Lower Body Dressing: Minimal assistance, Moderate assistance Where Assessed-Lower Body Dressing: Bed level Toileting: Minimal assistance Where Assessed-Toileting: Glass blower/designer: Psychiatric nurse Method: Arts development officer: Grab bars, Raised toilet seat Tub/Shower Transfer: Minimal assistance Tub/Shower Transfer Method: Stand pivot Tub/Shower Equipment: Radio broadcast assistant Vision Baseline Vision/History: Wears glasses Wears Glasses: At all times Vision Assessment?: Vision impaired- to be further tested in functional context Additional Comments: reports improvements in double vision.  Pt did not allow therapist to assess vision upon d/c due to decreased participation Perception  Perception: Impaired(inattention to Lt body) Cognition Overall Cognitive Status: Impaired/Different from baseline Arousal/Alertness: Awake/alert Orientation Level: Oriented X4 Memory: Impaired Awareness: Impaired Problem Solving: Impaired Behaviors: Restless;Impulsive Safety/Judgment: Impaired Sensation Sensation Light Touch: Appears Intact Proprioception: Impaired by gross assessment(impaired LUE/LLE) Coordination Gross Motor Movements are Fluid and Coordinated: No Fine Motor Movements are Fluid and Coordinated: No Coordination and Movement Description: limited by L hemiplegia Finger Nose Finger Test: flaccid LUE Extremity/Trunk Assessment RUE Assessment RUE Assessment: Within Functional Limits LUE  Assessment LUE Assessment: Exceptions to [facility] Passive Range of Motion (PROM) Comments: WNL Active Range of Motion (AROM) Comments: trace shoulder elevation, able to complete shoulder protraction/retraction antigravity General Strength Comments: trace shoulder elevation LUE Body System: Neuro Brunstrum levels for arm and hand: Arm;Hand Brunstrum level for arm: Stage I Presynergy Brunstrum level for hand: Stage I Flaccidity   Dessirae Scarola,  [facility] 02/09/2018, 4:53 PM

## 2018-02-09 NOTE — Progress Notes (Signed)
[place] PHYSICAL MEDICINE & REHABILITATION PROGRESS NOTE   Subjective/Complaints: Reasonable night. Sig other at bedside. No new issues. Remains withdrawn. Is eating better  ROS: Limited due to cognitive/behavioral    Objective:   No results found. Recent Labs    02/09/18 0539  WBC 12.3*  HGB 10.5*  HCT 31.5*  PLT 366   Recent Labs    02/09/18 0539  NA 137  K 4.4  CL 103  CO2 25  GLUCOSE 102*  BUN 21*  CREATININE 1.13  CALCIUM 9.9    Intake/Output Summary (Last 24 hours) at 02/09/2018 0836 Last data filed at 02/08/2018 1223 Gross per 24 hour  Intake 240 ml  Output -  Net 240 ml     Physical Exam: Vital Signs Blood pressure 112/81, pulse 73, temperature 98.1 F (36.7 C), temperature source Oral, resp. rate 15, height 5\' 11"  (1.803 m), weight 54.9 kg, SpO2 100 %.  Constitutional: No distress . Vital signs reviewed. HEENT: EOMI, oral membranes moist Neck: supple Cardiovascular: RRR without murmur. No JVD    Respiratory: CTA Bilaterally without wheezes or rales. Normal effort    GI: BS +, non-tender, non-distended  Musc: No edema or tenderness in extremities. Skin: No evidence of breakdown, no evidence of rash Neurologic:  Motor: Limited due to participation ? 4/5 in right deltoid, bicep, tricep, grip, hip flexor, knee extensors, ankle dorsiflexor and plantar flexor ? 0/5 LUE/LLE Tone MAS 2/4 Left hamstrings and Left elbow flexors--no changes Psych: withdrawn   Assessment/Plan: 1. 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  Physiatrist is providing close team supervision and 24 hour management of active medical problems listed below.  Physiatrist and rehab team continue to assess barriers to discharge/monitor patient progress toward functional and medical goals  Care Tool:  Bathing  Bathing activity did not occur: Refused Body parts bathed by patient: Front perineal area   Body parts bathed by helper: Right lower leg, Left lower leg, Front perineal area, Buttocks Body parts n/a: Face   Bathing assist Assist Level: Minimal Assistance - Patient > 75%     Upper Body Dressing/Undressing Upper body dressing   What is the patient wearing?: Pull over shirt    Upper body assist Assist Level: Moderate Assistance - Patient 50 - 74%    Lower Body Dressing/Undressing Lower body dressing      What is the patient wearing?: Pants     Lower body assist Assist for lower body dressing: Moderate Assistance - Patient 50 - 74%     Toileting Toileting    Toileting assist Assist for toileting: Minimal Assistance - Patient > 75%     Transfers Chair/bed transfer  Transfers assist  Chair/bed transfer activity did not occur: N/A  Chair/bed transfer assist level: Moderate Assistance - Patient 50 - 74%     Locomotion Ambulation   Ambulation assist   Ambulation activity did not occur: Safety/medical concerns  Assist level: Moderate Assistance - Patient 50 - 74% Assistive device: Other (comment)(hallway railing,LLE AFO) Max distance: 25   Walk 10 feet activity   Assist  Walk 10 feet activity did not occur: Safety/medical concerns  Assist level: Moderate Assistance - Patient - 50 - 74% Assistive device: Orthosis, Other (comment)(railing)   Walk 50 feet activity   Assist Walk 50 feet with 2 turns activity did not occur: Safety/medical concerns  Walk 150 feet activity   Assist Walk 150 feet activity did not occur: Safety/medical concerns         Walk 10 feet on uneven surface  activity   Assist Walk 10 feet on uneven surfaces activity did not occur: Safety/medical concerns         Wheelchair     Assist Will patient use wheelchair at discharge?: Yes Type of Wheelchair: Manual Wheelchair activity did not occur: Safety/medical  concerns  Wheelchair assist level: Supervision/Verbal cueing Max wheelchair distance: 40 ft    Wheelchair 50 feet with 2 turns activity    Assist    Wheelchair 50 feet with 2 turns activity did not occur: Safety/medical concerns   Assist Level: Supervision/Verbal cueing   Wheelchair 150 feet activity     Assist Wheelchair 150 feet activity did not occur: Safety/medical concerns        Medical Problem List and Plan: 1. 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  2. DVT Prophylaxis/Anticoagulation:   3. Pain Management:tylenol prn 4. Mood:appears very disengaged, suspect significant depression  -continue ritalin10mg  bid  -increase zoloft to 50mg  5. Neuropsych: This patientis not capable of making decisions on hisown behalf. 6. Skin/Wound Care:routine pressure relief measures. 7. Fluids/Electrolytes/Nutrition:Monitor I/O.  Improving intake  -obviously cognitive and behavioral issues are driving   Added Megace to  boost appetite---would continue at home  -I personally reviewed the patient's labs today.  Within acceptable range. Prealbumin 33.1 8. JJK:KXFGHWE BID. Continue metoprolol and catapres. Vitals:   02/08/18 2039 02/09/18 0444  BP: 116/83 112/81  Pulse: 82 73  Resp: 17 15  Temp: 98.2 F (36.8 C) 98.1 F (36.7 C)  SpO2: 100% 100%   Controlled on 12/30 9. Pancytopenia:Resolved Has resolved post Granix--d/c neutropenic precautions.Leukocytosis may be related to granix, also with thrombocytosis and anemia,appears to be trending down   -wbc's/ platelets trending down 11.3 on 12/27  -hgb trending up 10.5 today  10. Prediabetes: Hgb A1c- 6.4. Monitor BS ac/hs and use SSI for elevated BS 11. Testicular cancer:Dr. Magrinatrecommends no further chemo and monitoring  AFP/HCG every 6 months X 2 years.  12. Hypertriglyceridemia:On lipitor. 13. Depression: Was on Zyprexa for mood stabilization.     -continue trazodone which has been beneficial for sleep.   -Sertraline 25mg  daily added on 12/17  -ritalin trial 14.  LUE and LLE limb pain non focal , likely CVA related and a combination of increased tone and neurogenic pain  - increased tizanidine to 4mg  qhs- sleeping better  - gabapentin 100mg  qhs     LOS: 25 days A FACE TO FACE EVALUATION WAS PERFORMED  Meredith Staggers 02/09/2018, 8:36 AM

## 2018-02-09 NOTE — Progress Notes (Signed)
Patient and family given discharge instructions by Linna Hoff, [place]. Patient and family voiced understanding. Patient was wheeled down in wheel chair with all personal belongings found in room,  via nurse tech.  Nicholes Rough, LPN

## 2018-02-09 NOTE — Discharge Instructions (Signed)
Inpatient Rehab Discharge Instructions  Leif Dlouhy Discharge date and time:    Activities/Precautions/ Functional Status: Activity: no lifting, driving, or strenuous exercise till cleared by MD Diet: cardiac diet and diabetic diet Wound Care: none needed    Functional status:  ___ No restrictions     ___ Walk up steps independently ___ 24/7 supervision/assistance   ___ Walk up steps with assistance ___ Intermittent supervision/assistance  ___ Bathe/dress independently ___ Walk with walker     ___ Bathe/dress with assistance ___ Walk Independently    ___ Shower independently ___ Walk with assistance    ___ Shower with assistance ___ No alcohol     ___ Return to work/school ________  Special Instructions:   COMMUNITY REFERRALS UPON DISCHARGE:    Home Health:   PT, OT, SPT, AIDE, SW   Agency:[facility]   Phone:  [phone]                 Date of last service:02/09/2018  Medical Equipment/Items Ordered:WHEELCHAIR, 3 IN 1, TUB BENCH  Agency/Supplier:[facility]   [phone]  [facility] GIVEN TO MOM FOR ASSISTANCE WITH PRESCRIPTIONS MEDICAID AND DISABILITY APPLICATIONS COMPLETED-CONTACT TRACEY MATTHEWS [phone]  GENERAL COMMUNITY RESOURCES FOR PATIENT/FAMILY: Support Groups:[facility] SUPPORT GROUP THE THIRD Wednesday OF EVERY MONTH @ 3:00-4:00 PM AT [place] [phone]  STROKE/TIA DISCHARGE INSTRUCTIONS SMOKING Cigarette smoking nearly doubles your risk of having a stroke & is the single most alterable risk factor  If you smoke or have smoked in the last 12 months, you are advised to quit smoking for your health.  Most of the excess cardiovascular risk related to smoking disappears within a year of stopping.  Ask you doctor about anti-smoking medications  [place] Quit Line: 1-800-QUIT NOW  Free Smoking Cessation Classes [phone]  CHOLESTEROL Know your levels; limit fat & cholesterol in your diet  Lipid Panel       Component Value Date/Time   CHOL 204 (H) 01/05/2018 0500   TRIG 285 (H) 01/07/2018 0618   HDL NOT REPORTED DUE TO HIGH TRIGLYCERIDES 01/05/2018 0500   CHOLHDL NOT REPORTED DUE TO HIGH TRIGLYCERIDES 01/05/2018 0500   VLDL UNABLE TO CALCULATE IF TRIGLYCERIDE OVER 400 mg/dL 01/05/2018 0500   LDLCALC UNABLE TO CALCULATE IF TRIGLYCERIDE OVER 400 mg/dL 01/05/2018 0500      Many patients benefit from treatment even if their cholesterol is at goal.  Goal: Total Cholesterol (CHOL) less than 160  Goal:  Triglycerides (TRIG) less than 150  Goal:  HDL greater than 40  Goal:  LDL (LDLCALC) less than 100   BLOOD PRESSURE American Stroke Association blood pressure target is less that 120/80 mm/Hg  Your discharge blood pressure is:  BP: (!) 125/92  Monitor your blood pressure  Limit your salt and alcohol intake  Many individuals will require more than one medication for high blood pressure  DIABETES (A1c is a blood sugar average for last 3 months) Goal HGBA1c is under 7% (HBGA1c is blood sugar average for last 3 months)  Diabetes: Pre diabetes    Lab Results  Component Value Date   HGBA1C 6.4 (H) 01/04/2018     Your HGBA1c can be lowered with medications, healthy diet, and exercise.  Check your blood sugar as directed by your physician  Call your physician if you experience unexplained or low blood sugars.  PHYSICAL ACTIVITY/REHABILITATION Goal is 30 minutes at least 4 days per week  Activity: No driving, Therapies: see above Return to work: N/A at  this time.   Activity decreases your risk of heart attack and stroke and makes your heart stronger.  It helps control your weight and blood pressure; helps you relax and can improve your mood.  Participate in a regular exercise program.  Talk with your doctor about the best form of exercise for you (dancing, walking, swimming, cycling).  DIET/WEIGHT Goal is to maintain a healthy weight  Your discharge diet is:  Diet Order             DIET DYS 2 Room service appropriate? Yes; Fluid consistency: Nectar Thick  Diet effective now            liquids Your height is:  Height: 5\' 11"  (180.3 cm) Your current weight is: Weight: 59.7 kg Your Body Mass Index (BMI) is:  BMI (Calculated): 18.36  Following the type of diet specifically designed for you will help prevent another stroke.  You are below goal weight.   Your goal Body Mass Index (BMI) is 19-24.  Healthy food habits can help reduce 3 risk factors for stroke:  High cholesterol, hypertension, and excess weight.  RESOURCES Stroke/Support Group:  Call [phone]   STROKE EDUCATION PROVIDED/REVIEWED AND GIVEN TO PATIENT Stroke warning signs and symptoms How to activate emergency medical system (call 911). Medications prescribed at discharge. Need for follow-up after discharge. Personal risk factors for stroke. Pneumonia vaccine given:  Flu vaccine given:  My questions have been answered, the writing is legible, and I understand these instructions.  I will adhere to these goals & educational materials that have been provided to me after my discharge from the hospital.     My questions have been answered and I understand these instructions. I will adhere to these goals and the provided educational materials after my discharge from the hospital.  Patient/Caregiver Signature _______________________________ Date __________  Clinician Signature _______________________________________ Date __________  Please bring this form and your medication list with you to all your follow-up doctor's appointments.

## 2018-02-09 NOTE — Progress Notes (Signed)
Occupational Therapy Session Note  Patient Details  Name: Caleb Hubbard MRN: [id number] Date of Birth: 1981/01/28  Today's Date: 02/09/2018 OT Individual Time: 9211-9417 OT Individual Time Calculation (min): 54 min    Short Term Goals: Week 3:  OT Short Term Goal 1 (Week 3): Pt will recall hemi dressing techniques with min VC OT Short Term Goal 2 (Week 3): Pt will thread BLE into pants with min A OT Short Term Goal 3 (Week 3): Pt will complete toileting (hygiene and clothing management) with min assist  Skilled Therapeutic Interventions/Progress Updates:    Treatment session with focus on pt and family education in preparation for d/c home with pt's parents.  Pt received supine in bed attempting to refuse therapy session.  Educated on purpose of session with need to complete basic tasks to allow for pt's family members to complete hands on training with pt agreeable to bare minimum.  Pt donned pants at bed level with min assist and max cues for sequencing and to recall hemi-technique.  Educated pt's girlfriend, pt's mother and father on various levels of self-care as pt may opt to complete dressing at bed level (Min A) or at sit > stand (requiring Min- Mod A).  Encouraged increased participation throughout all tasks this session, with family providing max cues for participation.  Educated on stand pivot transfers w/c <> tub bench and w/c <> BSC (over toilet) with pt completing with min assist.  Educated on proper hand placement to allow facilitation of movement but not lifting as pt can initiate lifting during transfers.  Strongly encouraged hands on assist/facilitation during all self-care and transfers and reiterated pt increased fall risk due to impaired safety awareness, impaired sensation, and impaired motor in LUE/LLE.  Strongly recommending 24/7 supervision/min assist.  Pt's mother and father both completed transfers, demonstrating ability to provide physical and verbal cues with min assist from  therapist for safe setup.  Pt very withdrawn throughout session, but participatory with max encouragement.  Provided pt and family with self-ROM handout and educated on safe positioning of LUE during mobility, at rest, and during exercises.  Pt attempting to dismiss therapist while she was still providing education to family members.  Therapy Documentation Precautions:  Precautions Precautions: Fall Precaution Comments: corretrack Restrictions Weight Bearing Restrictions: No Pain: Pain Assessment Pain Scale: 0-10 Pain Score: 0-No pain   Therapy/Group: Individual Therapy  Simonne Come 02/09/2018, 12:15 PM

## 2018-02-12 ENCOUNTER — Telehealth: Payer: Self-pay

## 2018-02-12 NOTE — Telephone Encounter (Signed)
error 

## 2018-02-13 DIAGNOSIS — R7303 Prediabetes: Secondary | ICD-10-CM

## 2018-02-13 NOTE — Discharge Summary (Signed)
Physician Discharge Summary  Patient ID: Oluwasemilore Bahl MRN: [id number] DOB/AGE: [birth year] 38 y.o.  Admit date: 01/15/2018 Discharge date: 02/09/2018  Discharge Diagnoses:  Principal Problem:   Basilar artery embolism Active Problems:   Reactive depression   Spastic hemiplegia affecting nondominant side (HCC)   Essential hypertension   Poor nutrition   Sleep disturbance   Prediabetes   Discharged Condition: stable   Significant Diagnostic Studies: Dg Swallowing Func-speech Pathology  Result Date: 01/19/2018 Objective Swallowing Evaluation: Type of Study: MBS-Modified Barium Swallow Study  Patient Details Name: Anas Reister MRN: [id number] Date of Birth: [birth year] Today's Date: 01/19/2018 Time: SLP Start Time (ACUTE ONLY): 0920 -SLP Stop Time (ACUTE ONLY): 0930 SLP Time Calculation (min) (ACUTE ONLY): 10 min Past Medical History: Past Medical History: Diagnosis Date . Anxiety  . Cancer ([place])  . Headache  . Hypertension  . Testicular mass  Past Surgical History: Past Surgical History: Procedure Laterality Date . IR ANGIO INTRA EXTRACRAN SEL COM CAROTID INNOMINATE BILAT MOD SED  01/03/2018 . IR ANGIO VERTEBRAL SEL VERTEBRAL UNI L MOD SED  01/03/2018 . IR CT HEAD LTD  01/03/2018 . IR INTRA CRAN STENT  01/03/2018 . IR PERCUTANEOUS ART THROMBECTOMY/INFUSION INTRACRANIAL INC DIAG ANGIO  01/03/2018 . NO PAST SURGERIES   . ORCHIECTOMY Right 06/17/2017  Procedure: RIGHT RADICAL ORCHIECTOMY;  Surgeon: Ceasar Mons, MD;  Location: [facility];  Service: Urology;  Laterality: Right; . RADIOLOGY WITH ANESTHESIA N/A 01/03/2018  Procedure: CODE STROKE;  Surgeon: Luanne Bras, MD;  Location: [facility];  Service: Radiology;  Laterality: N/A; HPI: Mr. Jatinder Mcdonagh is a 38 y.o. male with history of renal insufficiency and testicular cancer with lymph node metastasis who presented with AMS, rigors and dystonic posturing. He did not receive IV t-PA due to unknown time of onset. 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 Pt intubated 11/23 to 11/26.  Subjective: pt alert and interactive, still very hoarse Assessment / Plan / Recommendation CHL IP CLINICAL IMPRESSIONS 01/19/2018 Clinical Impression Pt presents with mild oropharyngeal dysphagia. Oral phase is c/b decreased bolus cohesion and mildly increased mastication of peaches as well as chewing the barium tablet. Pharyngeal phase is c/b sensory impairment that results in consistently delayed swallow initiation to pyriform sinuses with intermittent penetration during swallow when consuming thin liquids. No aspiration was observed. Recommend continuing dysphagia 2 diet with thin liquids (via cup or straw), medicine crushed with intermittent suprvision. Of note, pt is known to this Probation officer. Pt continues to present with fluctuating verbal communication and overall mentation. During this study pt was initially nonverbal and used gestures. Support provided that Pt would benefit from being the person he was "when his grandmother was present on weekend." Pt immediately began verbalizing and interacting more appropriately. However pt continues to use immature language and intonation. He described the barium as "bad juice" etc.  SLP Visit Diagnosis Dysphagia, oropharyngeal phase (R13.12) Attention and concentration deficit following -- Frontal lobe and executive function deficit following -- Impact on safety and function Mild aspiration risk   CHL IP TREATMENT RECOMMENDATION 01/19/2018 Treatment Recommendations Therapy as outlined in treatment plan below   Prognosis 01/09/2018 Prognosis for Safe Diet Advancement Good Barriers to Reach Goals Severity of deficits Barriers/Prognosis Comment -- CHL IP DIET RECOMMENDATION 01/19/2018 SLP Diet Recommendations Dysphagia 2 (Fine chop) solids;Thin liquid Liquid Administration via Cup;Straw Medication  Administration Crushed with puree Compensations Minimize  environmental distractions;Slow rate;Small sips/bites Postural Changes Seated upright at 90 degrees   CHL IP OTHER RECOMMENDATIONS 01/19/2018 Recommended Consults -- Oral Care Recommendations Oral care BID Other Recommendations --   CHL IP FOLLOW UP RECOMMENDATIONS 01/19/2018 Follow up Recommendations Inpatient Rehab   CHL IP FREQUENCY AND DURATION 01/13/2018 Speech Therapy Frequency (ACUTE ONLY) min 2x/week Treatment Duration --      CHL IP ORAL PHASE 01/19/2018 Oral Phase Impaired Oral - Pudding Teaspoon -- Oral - Pudding Cup -- Oral - Honey Teaspoon -- Oral - Honey Cup -- Oral - Nectar Teaspoon WFL Oral - Nectar Cup WFL Oral - Nectar Straw NT Oral - Thin Teaspoon Premature spillage;Decreased bolus cohesion Oral - Thin Cup Premature spillage;Decreased bolus cohesion Oral - Thin Straw Premature spillage;Decreased bolus cohesion Oral - Puree Lingual pumping Oral - Mech Soft Reduced posterior propulsion;Weak lingual manipulation;Lingual pumping;Delayed oral transit;Decreased bolus cohesion Oral - Regular NT Oral - Multi-Consistency -- Oral - Pill Weak lingual manipulation;Decreased bolus cohesion;Delayed oral transit Oral Phase - Comment --  CHL IP PHARYNGEAL PHASE 01/19/2018 Pharyngeal Phase Impaired Pharyngeal- Pudding Teaspoon -- Pharyngeal -- Pharyngeal- Pudding Cup -- Pharyngeal -- Pharyngeal- Honey Teaspoon -- Pharyngeal -- Pharyngeal- Honey Cup -- Pharyngeal -- Pharyngeal- Nectar Teaspoon Pharyngeal residue - pyriform Pharyngeal -- Pharyngeal- Nectar Cup Delayed swallow initiation-pyriform sinuses Pharyngeal -- Pharyngeal- Nectar Straw NT Pharyngeal -- Pharyngeal- Thin Teaspoon Delayed swallow initiation-pyriform sinuses;Penetration/Aspiration during swallow Pharyngeal Material enters airway, remains ABOVE vocal cords then ejected out Pharyngeal- Thin Cup Delayed swallow initiation-pyriform sinuses;Penetration/Aspiration during swallow Pharyngeal Material  enters airway, remains ABOVE vocal cords then ejected out Pharyngeal- Thin Straw Delayed swallow initiation-pyriform sinuses;Penetration/Aspiration during swallow Pharyngeal Material enters airway, remains ABOVE vocal cords then ejected out Pharyngeal- Puree Delayed swallow initiation-vallecula Pharyngeal -- Pharyngeal- Mechanical Soft Delayed swallow initiation-vallecula Pharyngeal -- Pharyngeal- Regular -- Pharyngeal -- Pharyngeal- Multi-consistency -- Pharyngeal -- Pharyngeal- Pill (No Data) Pharyngeal -- Pharyngeal Comment --  CHL IP CERVICAL ESOPHAGEAL PHASE 01/19/2018 Cervical Esophageal Phase WFL Pudding Teaspoon -- Pudding Cup -- Honey Teaspoon -- Honey Cup -- Nectar Teaspoon -- Nectar Cup -- Nectar Straw -- Thin Teaspoon -- Thin Cup -- Thin Straw -- Puree -- Mechanical Soft -- Regular -- Multi-consistency -- Pill -- Cervical Esophageal Comment -- Happi Overton 01/19/2018, 10:20 AM               Labs:  Basic Metabolic Panel: BMP Latest Ref Rng & Units 02/09/2018 02/02/2018 01/26/2018  Glucose 70 - 99 mg/dL 102(H) 111(H) 106(H)  BUN 6 - 20 mg/dL 21(H) 22(H) 17  Creatinine 0.61 - 1.24 mg/dL 1.13 1.15 1.03  Sodium 135 - 145 mmol/L 137 136 136  Potassium 3.5 - 5.1 mmol/L 4.4 4.1 4.3  Chloride 98 - 111 mmol/L 103 99 100  CO2 22 - 32 mmol/L 25 28 25   Calcium 8.9 - 10.3 mg/dL 9.9 9.9 9.9    CBC: CBC Latest Ref Rng & Units 02/09/2018 02/06/2018 02/04/2018  WBC 4.0 - 10.5 K/uL 12.3(H) 11.3(H) 12.2(H)  Hemoglobin 13.0 - 17.0 g/dL 10.5(L) 10.7(L) 9.9(L)  Hematocrit 39.0 - 52.0 % 31.5(L) 32.2(L) 31.0(L)  Platelets 150 - 400 K/uL 366 395 412(H)    CBG: No results for input(s): GLUCAP in the last 168 hours.  Brief HPI:   Wissam Resor is a 38 year old male with history of HTN, situational depression, testicular cancer s/p post radical orchiectomy and recent retroperitoneal lymph node dissection with initiation of chemo 12/29/2017.  He was admitted on 01/03/2018 with confusion, dystonic posturing and  concerns of acute  seizure activity.   CTA head/neck revealed acute basilar thrombosis with bilateral cerebellar and occipital infarcts.  He underwent cerebral angiogram with endovascular revascularization of occluded basilar artery followed by rescue stent of severe focal stenosis of mid BA.  Follow-up MRI brain showed numerous supra and infratentorial tentorial posterior circulation nonhemorrhagic infarcts with mild cytotoxic edema.  Hospital course significant for pancytopenia treated with Granix with improvement in [place] as well as labile BP. .  Dr. Leonie Man felt the stroke was embolic due to unclear source and patient to continue ASA well as Brilinta bid due to stent.  He has had issues with tachycardia and CTA chest was negative for PE.  Dr. Jana Hakim felt that patient's cancer prognosis was good and did not recommend proceeding with any further chemotherapy.  Patient with resultant left hemiplegia, dysphagia, aphasia and cognitive deficits impacting overall functional status.  CIR recommended for follow-up therapy   Hospital Course: Adebayo Capers was admitted to rehab 01/15/2018 for inpatient therapies to consist of PT, ST and OT at least three hours five days a week. Past admission physiatrist, therapy team and rehab RN have worked together to provide customized collaborative inpatient rehab.  Team has provided ego support throughout his stay to help improve participation.  Dr. Sima Matas neuropsychologist was consulted and has been for following up with patient to help manage anxiety and depression he was started on Ritalin for activation and Zoloft was added and titrated upwards for mood stabilization.  He was maintained on dysphagia 2 diet due to evidence of dysphagia and his unwillingness to participate in trials of different consistencies with speech therapy.  Family has been educated on need to maintain  current diet to reduce aspiration risk. Megace was added to help boost appetite.   Blood pressures  have been monitored on twice daily basis and are currently well controlled.  Blood sugars noted to be mildly elevated due to prediabetes.  Serial CBC shows pancytopenia has resolved with improvement in H&H and mild elevation in white count is stable.  He has been afebrile without any signs of infection.  Renal status shows mild prerenal azotemia and he was encouraged to push p.o. fluids.  Trazodone was added to help with sleep-wake disruption.  Tizanidine was also added to help with spasticity left upper and left lower extremity.  Low-dose gabapentin was added to help with left upper and left lower extremity dysesthesias.  He continued to have decreased participation with poor motivation and behavioral issues which have limited his overall progress during his rehab stay.  His length of stay was extended to per family request.  He requires min to mod assist for most tasks and will continue to receive further follow-up [place], OT, ST, Aide and SW by [facility] after discharge   Rehab course: During patient's stay in rehab weekly team conferences were held to monitor patient's progress, set goals and discuss barriers to discharge. At admission, patient required mod to max assist with ADL task and mod assist with mobility.  He exhibited decreased vocal intensity with reduced speech intelligibility, cognitive deficits affecting attention memory emergent awareness and semi-complex problem-solving as well as signs of dysphagia due to delayed swallow and decreased sensation. He  has had improvement in activity tolerance, balance, postural control as well as ability to compensate for deficits.  He has made limited progress during his CIR stay.  He currently requires mod assist for lower body bathing and dressing and min to mod assist for transfers depending on motivation  and participation.  He requires mod assist for transfers and is able to ambulate 25 feet with mod to max assist.  He requires max to mod  multi-model cues for all tasks as well as recall and selective attention.  He continues to have mild anterior spillage with pocketing of dysphagia 2 textures.  Speech is 90% intelligible at phrase and sentence level with min cues to slow rate of speech.  Family was educated that ambulation needed to be limited with therapy only.  Family has been educated with regarding all aspects of care, safety and mobility.   Disposition: Home  Diet: Dysphagia 2, thin liquids  Special Instructions: 1.  Needs supervision with meals and assistance with cognitive tasks/medication management. 2.  Monitor carb/sweet intake. 3.  Repeat follow-up CBC and BMET in a couple weeks for follow-up on white count as well as renal status.     Discharge Instructions    Ambulatory referral to Physical Medicine Rehab   Complete by:  As directed    1-2 weeks transitional care appt     Allergies as of 02/09/2018      Reactions   Lactose Intolerance (gi) Nausea And Vomiting      Medication List    STOP taking these medications   feeding supplement (ENSURE ENLIVE) Liqd   pantoprazole sodium 40 mg/20 mL Pack Commonly known as:  PROTONIX Replaced by:  pantoprazole 40 MG tablet   prochlorperazine 10 MG tablet Commonly known as:  COMPAZINE   RESOURCE THICKENUP CLEAR Powd     TAKE these medications   acetaminophen 325 MG tablet Commonly known as:  TYLENOL Take 1-2 tablets (325-650 mg total) by mouth every 4 (four) hours as needed for mild pain.   aspirin 81 MG chewable tablet Chew 1 tablet (81 mg total) by mouth daily.   atorvastatin 80 MG tablet Commonly known as:  LIPITOR Take 1 tablet (80 mg total) by mouth daily at 6 PM.   cloNIDine 0.1 MG tablet Commonly known as:  CATAPRES Take 1 tablet (0.1 mg total) by mouth daily.   gabapentin 100 MG capsule Commonly known as:  NEURONTIN Take 1 capsule (100 mg total) by mouth at bedtime.   megestrol 400 MG/10ML suspension Commonly known as:  MEGACE Take 10  mLs (400 mg total) by mouth 2 (two) times daily.   metoprolol tartrate 25 MG tablet Commonly known as:  LOPRESSOR Take 1 tablet (25 mg total) by mouth 2 (two) times daily.   multivitamin with minerals Tabs tablet Take 1 tablet by mouth daily.   pantoprazole 40 MG tablet Commonly known as:  PROTONIX Take 1 tablet (40 mg total) by mouth daily. Replaces:  pantoprazole sodium 40 mg/20 mL Pack   polyethylene glycol packet Commonly known as:  MIRALAX / GLYCOLAX Take 17 g by mouth daily.   sertraline 50 MG tablet Commonly known as:  ZOLOFT Take 1 tablet (50 mg total) by mouth daily.   ticagrelor 90 MG Tabs tablet Commonly known as:  BRILINTA Take 1 tablet (90 mg total) by mouth 2 (two) times daily.   tiZANidine 4 MG tablet Commonly known as:  ZANAFLEX Take 1 tablet (4 mg total) by mouth at bedtime.   traZODone 100 MG tablet Commonly known as:  DESYREL Take 1 tablet (100 mg total) by mouth at bedtime.      Follow-up Information    Kirsteins, Luanna Salk, MD Follow up.   Specialty:  Physical Medicine and Rehabilitation Why:  Office will call you with follow  up appointment Contact information: [place] [address] [phone]        [facility]. Call.   Why:  for follow up appointent Contact information: [address]     [place] [address] [phone]       Luanne Bras, MD Follow up.   Specialties:  Interventional Radiology, Radiology Why:  will call for follow up on stent Contact information: [place] [address] [address] Follow up on 02/18/2018.   Specialty:  Family Medicine Why:  Appointment @ 2:45 PM Contact information: [address] [place] [address] [phone]           Signed: Bary Leriche 02/16/2018, 5:44 PM

## 2018-02-16 ENCOUNTER — Telehealth: Payer: Self-pay | Admitting: *Deleted

## 2018-02-16 NOTE — Telephone Encounter (Signed)
Caleb Hubbard OT called for 1 wk6. Approval given.

## 2018-02-19 ENCOUNTER — Encounter: Payer: MEDICAID | Attending: Registered Nurse | Admitting: Registered Nurse

## 2018-03-03 ENCOUNTER — Encounter: Payer: Self-pay | Admitting: Family Medicine

## 2018-03-03 ENCOUNTER — Telehealth (HOSPITAL_COMMUNITY): Payer: Self-pay

## 2018-03-03 NOTE — Telephone Encounter (Signed)
Called to schedule f/u, no answer, left vm. AW 

## 2018-03-04 ENCOUNTER — Telehealth (HOSPITAL_COMMUNITY): Payer: Self-pay

## 2018-03-04 NOTE — Telephone Encounter (Signed)
Called to schedule f/u. They live in [place]. Will call back to schedule once other appointments are set so they can come to allat once. AW

## 2018-03-05 ENCOUNTER — Encounter: Payer: Self-pay | Admitting: Family Medicine

## 2018-03-05 ENCOUNTER — Ambulatory Visit (INDEPENDENT_AMBULATORY_CARE_PROVIDER_SITE_OTHER): Payer: Self-pay | Admitting: Family Medicine

## 2018-03-05 VITALS — BP 135/90 | HR 59 | Wt 167.2 lb

## 2018-03-05 DIAGNOSIS — R632 Polyphagia: Secondary | ICD-10-CM

## 2018-03-05 DIAGNOSIS — I6930 Unspecified sequelae of cerebral infarction: Secondary | ICD-10-CM

## 2018-03-05 DIAGNOSIS — R748 Abnormal levels of other serum enzymes: Secondary | ICD-10-CM

## 2018-03-05 DIAGNOSIS — I69354 Hemiplegia and hemiparesis following cerebral infarction affecting left non-dominant side: Secondary | ICD-10-CM

## 2018-03-05 DIAGNOSIS — F430 Acute stress reaction: Secondary | ICD-10-CM

## 2018-03-05 DIAGNOSIS — R7303 Prediabetes: Secondary | ICD-10-CM

## 2018-03-05 DIAGNOSIS — G4700 Insomnia, unspecified: Secondary | ICD-10-CM

## 2018-03-05 DIAGNOSIS — Z7689 Persons encountering health services in other specified circumstances: Secondary | ICD-10-CM

## 2018-03-05 DIAGNOSIS — G4709 Other insomnia: Secondary | ICD-10-CM

## 2018-03-05 DIAGNOSIS — G8110 Spastic hemiplegia affecting unspecified side: Secondary | ICD-10-CM

## 2018-03-05 DIAGNOSIS — Z8673 Personal history of transient ischemic attack (TIA), and cerebral infarction without residual deficits: Secondary | ICD-10-CM

## 2018-03-05 MED ORDER — TRAZODONE HCL 100 MG PO TABS: 100.0000 mg | ORAL_TABLET | Freq: Every day | ORAL | 0 refills | Status: DC

## 2018-03-05 MED ORDER — TIZANIDINE HCL 4 MG PO TABS: 4.0000 mg | ORAL_TABLET | Freq: Every day | ORAL | 3 refills | Status: DC

## 2018-03-05 MED ORDER — TRAZODONE HCL 100 MG PO TABS: 100.0000 mg | ORAL_TABLET | Freq: Every day | ORAL | 1 refills | Status: AC

## 2018-03-05 NOTE — Progress Notes (Signed)
Caleb Hubbard, is a 38 y.o. male  KVQ:[id number]  IEP:[id number]  DOB - May 04, 1980  CC:  Chief Complaint  Patient presents with  . Establish Care  . Hospitalization Follow-up    ED->Hosp 11/23-12/5: stroke, basilar artery occlusion. inpatient rehab from 12/5-12/30       HPI: Caleb Hubbard is a 38 y.o. male is here today to establish care.   Caleb Hubbard has Stroke (cerebrum) ([place]); Basilar artery occlusion; Endotracheally intubated; Cancer ([place]); Basilar artery embolism; Reactive depression; Spastic hemiplegia affecting nondominant side ([place]); Essential hypertension; Poor nutrition; Sleep disturbance; and Prediabetes on their problem list.   Patient is here accompanied by his mother who is currently his caretaker. 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 He is here for a hospital follow-up.   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 He underwent treatment chemotherapy which concluded in 01/01/18. On 01/03/2018, patient presented to [facility] with nausea, altered mental status, and dystonic posturing. Work-up revealed patient suffered a cerebellar, brainstem, and occipital infarcts from basilar artery occlusion. He was admitted to neuro ICU , ventilated, and underwent revascularization. He was discharged to Inpatient rehabilitation, where he remained until 01/15/18. He continues to experience residual deficits including aphasia, dysphagia, and  left hemiplegia. He currently resides with his mother. He is not able to attend outpatient rehabilitation due to lack of health insurance. Currently awaiting approval for disability, medicare, and medicaid. He was able to obtain all medication from [facility].   Mother is concern that patient is always hungry. He was prescribed megace and this had been discontinued by the ordering provider in efforts  to reduce increased appetite. Mother reports he eats around the clock. In November, he had an A1C within the prediabetes range, although during that time he was prescribed Decadron. He is incontinent of urinary do to lack of sensation.  He drinks fluids all day and mother stops last fluid intake around 7:00 pm although this is not new.   Mother  is also concern regarding his mood. Feels the he is very angry. Patient reports feeling irritated that family is constantly yelling at him and telling him what to do. He is wheelchair bound. Prefers to stay confined to his room. He has a history of depression, although denies he is currently depressed.  He prefers to discontinue SSRI he was prescribed while impatient. He is not sleeping well and requests medication.    Current medications: Current Outpatient Medications:  .  acetaminophen (TYLENOL) 325 MG tablet, Take 1-2 tablets (325-650 mg total) by mouth every 4 (four) hours as needed for mild pain., Disp: , Rfl:  .  aspirin 81 MG chewable tablet, Chew 1 tablet (81 mg total) by mouth daily., Disp: , Rfl:  .  atorvastatin (LIPITOR) 80 MG tablet, Take 1 tablet (80 mg total) by mouth daily at 6 PM., Disp: 30 tablet, Rfl: 1 .  cloNIDine (CATAPRES) 0.1 MG tablet, Take 1 tablet (0.1 mg total) by mouth daily., Disp: 60 tablet, Rfl: 11 .  gabapentin (NEURONTIN) 100 MG capsule, Take 1 capsule (100 mg total) by mouth at bedtime., Disp: 30 capsule, Rfl: 1 .  metoprolol tartrate (LOPRESSOR) 25 MG tablet, Take 1 tablet (25 mg total) by mouth 2 (two) times daily., Disp: 60 tablet, Rfl: 1 .  Multiple Vitamin (MULTIVITAMIN WITH MINERALS) TABS tablet, Take 1 tablet by mouth daily., Disp: , Rfl:  .  sertraline (ZOLOFT) 50 MG tablet, Take 1 tablet (50 mg total) by mouth daily., Disp: 30 tablet, Rfl: 1 .  ticagrelor (BRILINTA) 90 MG TABS tablet, Take 1 tablet (90 mg total) by mouth 2 (two) times daily., Disp: 60 tablet, Rfl: 1 .  tiZANidine (ZANAFLEX) 4 MG tablet, Take 1  tablet (4 mg total) by mouth at bedtime., Disp: 30 tablet, Rfl: 3 .  traZODone (DESYREL) 100 MG tablet, Take 1 tablet (100 mg total) by mouth at bedtime., Disp: 90 tablet, Rfl: 1   Pertinent family medical history: family history includes High blood pressure in his father.   Allergies  Allergen Reactions  . Lactose Intolerance (Gi) Nausea And Vomiting    Social History   Socioeconomic History  . Marital status: Single    Spouse name: Not on file  . Number of children: Not on file  . Years of education: Not on file  . Highest education level: Not on file  Occupational History  . Not on file  Social Needs  . Financial resource strain: Not on file  . Food insecurity:    Worry: Not on file    Inability: Not on file  . Transportation needs:    Medical: Not on file    Non-medical: Not on file  Tobacco Use  . Smoking status: Current Every Day Smoker    Types: Cigars  . Smokeless tobacco: Never Used  Substance and Sexual Activity  . Alcohol use: No  . Drug use: Yes    Types: Marijuana    Comment: LAST USE MARIJUANA 06-07-17  . Sexual activity: Not on file  Lifestyle  . Physical activity:    Days per week: Not on file    Minutes per session: Not on file  . Stress: Not on file  Relationships  . Social connections:    Talks on phone: Not on file    Gets together: Not on file    Attends religious service: Not on file    Active member of club or organization: Not on file    Attends meetings of clubs or organizations: Not on file    Relationship status: Not on file  . Intimate partner violence:    Fear of current or ex partner: Not on file    Emotionally abused: Not on file    Physically abused: Not on file    Forced sexual activity: Not on file  Other Topics Concern  . Not on file  Social History Narrative  . Not on file    Review of Systems: Constitutional: Negative for fever, chills, diaphoresis, activity change, appetite change and fatigue. HENT: Negative for ear  pain, nosebleeds, congestion, facial swelling, rhinorrhea, neck pain, neck stiffness and ear discharge.  Eyes: Negative for pain, discharge, redness, itching and visual disturbance. Respiratory: Negative for cough, choking, chest tightness, shortness of breath, wheezing and stridor.  Cardiovascular: Negative for chest pain, palpitations and leg swelling. Gastrointestinal: Negative for abdominal distention. Genitourinary: Negative for dysuria, urgency, frequency, hematuria, flank pain, decreased urine volume, difficulty urinating. Musculoskeletal: Negative for back pain, joint swelling, arthralgia and gait problem. Neurological: Negative for dizziness, tremors, seizures, syncope, facial asymmetry, speech difficulty, weakness, light-headedness, numbness and headaches.  Hematological: Negative for adenopathy. Does not bruise/bleed easily. Psychiatric/Behavioral: Negative for hallucinations, behavioral problems, confusion, dysphoric mood, decreased concentration and agitation.    Objective:   Vitals:   03/05/18 1349  BP: 135/90  Pulse: (!) 59    BP Readings from Last 3 Encounters:  03/05/18 135/90  02/09/18  112/81  01/15/18 125/86    Filed Weights   03/05/18 1349  Weight: 167 lb 3.2 oz (75.8 kg)      Physical Exam: Constitutional: Patient appears well-developed and well-nourished. No distress. HENT: Normocephalic, atraumatic, External right and left ear normal. Oropharynx is clear and moist.  Eyes: Conjunctivae and EOM are normal. PERRLA, no scleral icterus. Neck: Normal ROM. Neck supple. No JVD. No tracheal deviation. No thyromegaly. CVS: RRR, S1/S2 +, no murmurs, no gallops, no carotid bruit.  Pulmonary: Effort and breath sounds normal, no stridor, rhonchi, wheezes, rales.  Abdominal: Soft. BS +, no distension, tenderness, rebound or guarding.  Musculoskeletal:  No edema and no tenderness.  Neuro: Alert. Abnormal sensation and muscle stone. Immobile -in wheelchair  Skin: Skin  is warm and dry. No rash noted. Not diaphoretic. No erythema. No pallor. Psychiatric: Flat affect, verbally aggressive towards mother then apologetic   Lab Results (prior encounters)  Lab Results  Component Value Date   WBC 12.3 (H) 02/09/2018   HGB 10.5 (L) 02/09/2018   HCT 31.5 (L) 02/09/2018   MCV 99.1 02/09/2018   PLT 366 02/09/2018   Lab Results  Component Value Date   CREATININE 1.13 02/09/2018   BUN 21 (H) 02/09/2018   NA 137 02/09/2018   K 4.4 02/09/2018   CL 103 02/09/2018   CO2 25 02/09/2018    Lab Results  Component Value Date   HGBA1C 6.4 (H) 01/04/2018       Component Value Date/Time   CHOL 204 (H) 01/05/2018 0500   TRIG 285 (H) 01/07/2018 0618   HDL NOT REPORTED DUE TO HIGH TRIGLYCERIDES 01/05/2018 0500   CHOLHDL NOT REPORTED DUE TO HIGH TRIGLYCERIDES 01/05/2018 0500   VLDL UNABLE TO CALCULATE IF TRIGLYCERIDE OVER 400 mg/dL 01/05/2018 0500   LDLCALC UNABLE TO CALCULATE IF TRIGLYCERIDE OVER 400 mg/dL 01/05/2018 0500        Assessment and plan:  1. Encounter to establish care   2. Prediabetes - Hemoglobin A1c - Comprehensive metabolic panel  3. Increased appetite - Hemoglobin A1c - TSH  4. Elevated liver enzymes - Hepatic Function Panel; Future  5. Recent cerebrovascular accident (CVA) - CBC with Differential  6. Spastic hemiplegia affecting nondominant side ([place]) -Patient already referred to PT. He current is without insurance, mother is waiting to schedule appointment.   7. Insomnia, unspecified type -Trial Trazodone    8. History of CVA with residual deficit -patient already referred to PT. He current is without insurance, mother is waiting to schedule appointment.   9. Stress reaction causing mixed disturbance of emotion and conduct -Continue Zoloft for now. -Once insurance is approved, recommend talk therapy to help process feelings related to current state of health.   Mother was given the option to obtain medications at  [facility], she declined reporting that the hospital made arrangements at [facility].    The patient was given clear instructions to go to ER or return to medical symptoms don't improve, worsen or new problems develop. The patient verbalized understanding. The patient was advised  to call and obtain lab results if they haven't heard anything from out office within 7-10 business days.  Molli Barrows, FNP-C Primary Care at [facility] [address], [place] [address] [phone]: [phone]    This note has been created with Dragon speech recognition software and Engineer, materials. Any transcriptional errors are unintentional.

## 2018-03-05 NOTE — Patient Instructions (Addendum)
Thank you for choosing Primary Care at [facility] to be your medical home!    Caleb Hubbard was seen by Molli Barrows, FNP today.   Rashaad Christo's primary care provider is Scot Jun, FNP.   For the best care possible, you should try to see Molli Barrows, FNP-C whenever you come to the clinic.   We look forward to seeing you again soon!  If you have any questions about your visit today, please call us at [phone] or feel free to reach your primary care provider via [place].      Contact me via phone if your Medicaid is approved prior to your next follow-up in order for home health orders to be placed.   I have refilled medications. No changes in medications today. You will be notifed via phone of lab results.    Supporting Someone After a Stroke Caregivers provide essential physical and emotional support to people who have had a stroke. If you are supporting someone who has had a stroke, you play an important role in coordinating schedules, helping your loved one to communicate, and helping with the overall rehabilitation plan. What do I need to know about my loved one's recovery? Recovering from a stroke can take weeks, months, or years. Some people may be able to return to a normal lifestyle, and other people may have permanent problems with movement (mobility), thinking, behavior, or communication. Understanding your loved one's condition can help you manage the role of caregiver. Your loved one's health care team may rely on you to provide information such as medical history and current medicines. How can I support my friend or family member? Planning for discharge from the hospital Ask to meet with a social worker or a stroke care coordinator, if one is available. This person can help you to plan for discharge. Before you leave the hospital, make sure you understand:  The recovery process after a stroke.  Physical, emotional, behavioral, and other changes that may  affect your loved one after a stroke.  The treatments for stroke, including the medicines that your loved one has to take.  How to lower the risk of another stroke.  Diet and exercise changes for your loved one.  Whether you will need extra help at home.  Whether your loved one will need help using the bathroom, bathing, eating, or doing other activities.  Changes you have to make at home to make it safe for your loved one.  Whether you need to get special devices or equipment for your loved one. General help  Help your loved one establish a daily routine. This may include setting reminders or having a shared day planner or calendar.  Encourage rest. Your loved one may need frequent breaks during social situations or other activities.  Be patient. Your loved one may take longer to complete tasks and to process information.  When giving instructions, give only one instruction at a time or give step-by-step lists. Multitasking can be difficult after a stroke.  Offer assistance with household chores or other daily tasks. Make "freezer meals" that can be reheated.  Do not set expectations about your loved one's recovery. Medical visits  Gently remind your loved one of tasks and medical visits if he or she is forgetful.  Provide transportation to and from appointments.  Attend rehabilitation appointments with your loved one. By being involved in the rehabilitation plan, you can encourage your loved one and help with exercises and therapy activities at home. Preventing falls  Follow instructions to prevent falls in your loved one's home. These may include: ? Installing grab bars in bathrooms and handrails in stairways. ? Using night-lights in the bedroom, bathroom, or hallways. ? Removing rugs and mats or making them stick to the floor. ? Keeping walkways clear by removing cords and clutter from the floor. Managing finances  Help to manage your loved one's finances. Talk with  a Education officer, museum or legal professional if: ? Your loved one is unable to return to work and is in need of financial help. ? You need help paying medical bills. ? You need to establish guardianship over finances. ? You need help with estate planning. How should I care for myself? Helping a loved one recover from a stroke can be rewarding, and it can also be challenging and stressful at times. Make sure to care for your own well-being during this time. Lifestyle  Rest. Try to get 7-9 hours of uninterrupted sleep each night.  Eat a balanced diet that includes fresh fruits and vegetables, whole grains, lean proteins, and low-fat dairy.  Exercise for 30 or more minutes on 5 or more days each week.  Find ways to manage stress. These may include: ? Deep breathing, yoga, or meditation. ? Spending time outdoors. ? Journaling. Finding support   Ask for help. Take a break if you are the primary caregiver to your loved one.  Spend time with supportive people.  Join a support group with other caregivers or family members of people who have had a stroke.  If you experience new or worsening depression or anxiety, seek counseling from a mental health professional. Where to find more information You may find more information about supporting someone who has had a stroke from:  National Stroke Association: FetchFilms.dk  American Stroke Association: www.strokeassociation.org/STROKEORG Summary  Caregivers provide essential physical and emotional support to people who have had a stroke.  Before you leave the hospital, make sure you understand how to care for someone who has had a stroke.  It is normal to have many different emotions while caring for someone who has had a stroke. Make sure to care for your own well-being during this time. This information is not intended to replace advice given to you by your health care provider. Make sure you discuss any  questions you have with your health care provider. Document Released: 05/17/2016 Document Revised: 05/17/2016 Document Reviewed: 05/17/2016 Elsevier Interactive Patient Education  2019 [place]   Warning Signs of a Stroke  A stroke is a medical emergency and should be treated right away-every second counts. A stroke is caused by a decrease or block in blood flow to the brain. When this occurs, certain areas of the brain do not get enough oxygen, and brain cells begin to die. A stroke can lead to brain damage and can sometimes be life-threatening. However, if someone having a stroke gets medical treatment right away, he or she has better chances of surviving and recovering from the stroke. Being able to recognize the symptoms of a stroke is very important. Types of strokes There are two main types of strokes:  Ischemic strokes. This is the most common type of stroke. These strokes happen when a blood vessel that supplies blood to the brain is being blocked.  Hemorrhagic strokes. These strokes result from bleeding in the brain due to a blood vessel leaking or bursting (rupturing). A transient ischemic attack (TIA) is a "warning stroke" that causes stroke-like symptoms that go away quickly. Unlike  a stroke, a TIA does not cause permanent damage to the brain. However, the symptoms of a TIA are the same as a stroke, and they also require medical treatment right away. Having a TIA is a sign that you are at higher risk for a permanent stroke. Warning signs of a stroke The symptoms of stroke may vary and will reflect the part of the brain that is involved. Symptoms usually happen suddenly. "BE FAST" is an easy way to remember the main warning signs of a stroke. B - Balance Signs are dizziness, sudden trouble walking, or loss of balance. E - Eyes Signs are trouble seeing or a sudden change in vision. F - Face Signs are sudden weakness or numbness of the face, or the face or eyelid drooping on one  side. A - Arms Signs are weakness or numbness in an arm. This happens suddenly and usually on one side of the body. S - Speech Signs are sudden trouble speaking, slurred speech, or trouble understanding what people say. T - Time Time to call emergency services. Write down what time symptoms started. Other signs of a stroke Some less common signs of a stroke include:  A sudden, severe headache with no known cause.  Nausea or vomiting.  Seizure. A stroke may be happening even if only one "BE FAST" symptoms is present. These symptoms may represent a serious problem that is an emergency. Do not wait to see if the symptoms will go away. Get medical help right away. Call your local emergency services (911 in the [place] Do not drive yourself to the hospital. Summary  A stroke is a medical emergency and should be treated right away-every second counts.  "BE FAST" is an easy way to remember the main warning signs of a stroke.  Call local emergency services right away if you or someone else has any stroke symptoms, even if the symptoms go away.  Make note of what time the first symptoms appeared. Emergency responders or emergency room staff will need to know this information.  Do not wait to see if symptoms will go away. Call 911 even if only one of the "BE FAST" symptoms appears. This information is not intended to replace advice given to you by your health care provider. Make sure you discuss any questions you have with your health care provider. Document Released: 05/17/2016 Document Revised: 05/17/2016 Document Reviewed: 05/17/2016 Elsevier Interactive Patient Education  2019 [employer].

## 2018-03-06 LAB — CBC WITH DIFFERENTIAL/PLATELET
Basophils Absolute: 0.1 10*3/uL (ref 0.0–0.2)
Basos: 1 %
EOS (ABSOLUTE): 0.2 10*3/uL (ref 0.0–0.4)
Eos: 3 %
Hematocrit: 33.2 % — ABNORMAL LOW (ref 37.5–51.0)
Hemoglobin: 11.3 g/dL — ABNORMAL LOW (ref 13.0–17.7)
Immature Grans (Abs): 0 10*3/uL (ref 0.0–0.1)
Immature Granulocytes: 0 %
LYMPHS ABS: 2 10*3/uL (ref 0.7–3.1)
Lymphs: 28 %
MCH: 33.6 pg — ABNORMAL HIGH (ref 26.6–33.0)
MCHC: 34 g/dL (ref 31.5–35.7)
MCV: 99 fL — AB (ref 79–97)
Monocytes Absolute: 0.5 10*3/uL (ref 0.1–0.9)
Monocytes: 6 %
Neutrophils Absolute: 4.4 10*3/uL (ref 1.4–7.0)
Neutrophils: 62 %
PLATELETS: 269 10*3/uL (ref 150–450)
RBC: 3.36 x10E6/uL — ABNORMAL LOW (ref 4.14–5.80)
RDW: 13.1 % (ref 11.6–15.4)
WBC: 7.2 10*3/uL (ref 3.4–10.8)

## 2018-03-06 LAB — COMPREHENSIVE METABOLIC PANEL
A/G RATIO: 2 (ref 1.2–2.2)
ALT: 65 IU/L — ABNORMAL HIGH (ref 0–44)
AST: 31 IU/L (ref 0–40)
Albumin: 4.5 g/dL (ref 4.0–5.0)
Alkaline Phosphatase: 91 IU/L (ref 39–117)
BUN/Creatinine Ratio: 24 — ABNORMAL HIGH (ref 9–20)
BUN: 23 mg/dL — ABNORMAL HIGH (ref 6–20)
Bilirubin Total: 0.3 mg/dL (ref 0.0–1.2)
CO2: 23 mmol/L (ref 20–29)
Calcium: 9.5 mg/dL (ref 8.7–10.2)
Chloride: 102 mmol/L (ref 96–106)
Creatinine, Ser: 0.97 mg/dL (ref 0.76–1.27)
GFR calc Af Amer: 116 mL/min/{1.73_m2} (ref >59)
GFR calc non Af Amer: 100 mL/min/{1.73_m2} (ref >59)
Globulin, Total: 2.3 g/dL (ref 1.5–4.5)
Glucose: 121 mg/dL — ABNORMAL HIGH (ref 65–99)
Potassium: 4.3 mmol/L (ref 3.5–5.2)
Sodium: 141 mmol/L (ref 134–144)
Total Protein: 6.8 g/dL (ref 6.0–8.5)

## 2018-03-06 LAB — HEMOGLOBIN A1C
Est. average glucose Bld gHb Est-mCnc: 108 mg/dL
Hgb A1c MFr Bld: 5.4 % (ref 4.8–5.6)

## 2018-03-06 LAB — TSH: TSH: 0.95 u[IU]/mL (ref 0.450–4.500)

## 2018-03-06 MED FILL — tiZANidine HCL 4 MG TABS: 4 | 30 days supply | Qty: 30 | Fill #0

## 2018-03-06 MED FILL — BRILINTA 90 MG TABLET: 90 | 30 days supply | Qty: 60 | Fill #0

## 2018-03-09 ENCOUNTER — Other Ambulatory Visit (HOSPITAL_COMMUNITY): Payer: Self-pay | Admitting: Interventional Radiology

## 2018-03-09 ENCOUNTER — Telehealth: Payer: Self-pay | Admitting: Family Medicine

## 2018-03-09 DIAGNOSIS — I639 Cerebral infarction, unspecified: Secondary | ICD-10-CM

## 2018-03-09 NOTE — Telephone Encounter (Signed)
Called pharmacy to follow up on prescriptions.  Trazodone was $10 at [facility] & patient picked it up. The Zanaflex was expensive & so it was transferred to a different pharmacy and picked up at a lower cost.

## 2018-03-09 NOTE — Telephone Encounter (Signed)
Contact patient's care taker to ensure they were able to pick up his medication. I recall being told that there was an issue getting medication however, there's no note on the system. If needed, please send prescriptions to [facility] and I will co-sign if mother was unable to obtain from [facility]

## 2018-03-10 NOTE — Telephone Encounter (Signed)
I requested that the mother be called to confirm that she was able to obtain all of patients medication not just the medication I ordered. She has refills that she sent to the pharmacy because she was under the impression the hospital has secured special pricing at [facility]. He is currently taking a blood thinner, I wanted to make certain he will not run out of medication. I can sent any prescriptions to [facility] for mother to pick-up.

## 2018-03-11 NOTE — Telephone Encounter (Signed)
Voicemail left.

## 2018-03-11 NOTE — Progress Notes (Signed)
Patient's mom notified of results & recommendations. Expressed understanding. She states that she would like to have the orders faxed to the [facility] on [address] in [place] so that they won't have to drive all the way to [place] just for labs.

## 2018-03-11 NOTE — Telephone Encounter (Signed)
Patient's mom was able to get all of his medications from pharmacy with no issue.

## 2018-03-30 ENCOUNTER — Encounter: Payer: Medicaid Other | Attending: Registered Nurse | Admitting: Registered Nurse

## 2018-03-30 ENCOUNTER — Encounter: Payer: Self-pay | Admitting: Registered Nurse

## 2018-03-30 ENCOUNTER — Ambulatory Visit (HOSPITAL_COMMUNITY)
Admission: RE | Admit: 2018-03-30 | Discharge: 2018-03-30 | Disposition: A | Discharge status: Home / Self Care | Payer: Medicaid Other | Source: Ambulatory Visit | Attending: Interventional Radiology | Admitting: Interventional Radiology

## 2018-03-30 ENCOUNTER — Other Ambulatory Visit: Payer: Self-pay

## 2018-03-30 ENCOUNTER — Encounter (HOSPITAL_COMMUNITY): Payer: Self-pay

## 2018-03-30 ENCOUNTER — Telehealth: Payer: Self-pay | Admitting: Family Medicine

## 2018-03-30 VITALS — BP 125/87 | HR 90 | Ht 69.0 in | Wt 130.2 lb

## 2018-03-30 DIAGNOSIS — F329 Major depressive disorder, single episode, unspecified: Secondary | ICD-10-CM | POA: Diagnosis not present

## 2018-03-30 DIAGNOSIS — F17290 Nicotine dependence, other tobacco product, uncomplicated: Secondary | ICD-10-CM | POA: Insufficient documentation

## 2018-03-30 DIAGNOSIS — Z8673 Personal history of transient ischemic attack (TIA), and cerebral infarction without residual deficits: Secondary | ICD-10-CM | POA: Diagnosis not present

## 2018-03-30 DIAGNOSIS — N183 Chronic kidney disease, stage 3 (moderate): Secondary | ICD-10-CM | POA: Insufficient documentation

## 2018-03-30 DIAGNOSIS — G8110 Spastic hemiplegia affecting unspecified side: Secondary | ICD-10-CM | POA: Diagnosis not present

## 2018-03-30 DIAGNOSIS — R531 Weakness: Secondary | ICD-10-CM | POA: Insufficient documentation

## 2018-03-30 DIAGNOSIS — I651 Occlusion and stenosis of basilar artery: Secondary | ICD-10-CM

## 2018-03-30 DIAGNOSIS — I129 Hypertensive chronic kidney disease with stage 1 through stage 4 chronic kidney disease, or unspecified chronic kidney disease: Secondary | ICD-10-CM | POA: Insufficient documentation

## 2018-03-30 DIAGNOSIS — I10 Essential (primary) hypertension: Secondary | ICD-10-CM

## 2018-03-30 DIAGNOSIS — I639 Cerebral infarction, unspecified: Secondary | ICD-10-CM

## 2018-03-30 HISTORY — DX: Cerebral infarction, unspecified: I63.9

## 2018-03-30 NOTE — Progress Notes (Signed)
Chief Complaint: Patient was seen in consultation today for basilar artery occlusion s/p revascularization.  Referring Physician(s): CODE STROKE- Kerney Elbe  Supervising Physician: Luanne Bras  Patient Status: [facility] - Out-pt  History of Present Illness: Caleb Hubbard is a 38 y.o. 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 He is known to [facility] and has been followed by Dr. Estanislado Pandy since 12/2017. He first presented to our department as an active code stroke with symptoms of AMS and dystonic posturing. He underwent an emergent image-guided cerebral arteriogram with mechanical thrombectomy and rescue stent placement of his basilar artery occlusion achieving a TICI 2b revascularization 01/03/2018 by Dr. Estanislado Pandy. He was discharged to inpatient rehab 01/15/2018 and discharged home 02/09/2018.  Patient presents today for follow-up regarding his recent stroke and procedure 02/09/2018. Patient awake and alert sitting in wheelchair. Accompanied by mother and father. Complains of left-sided weakness, improved since discharge. States that he has not yet started outpatient PT. Complains of intermittent but "almost constant" shooting pain down left arm/leg. Complains of speech difficulty/high-pitched speech. States it has improved since discharge. Denies headache, numbness/tingling, dizziness, vision changes, hearing changes, or tinnitus.  Patient is currently taking Brilinta 90 mg twice daily and Aspirin 81 mg once daily.   Past Medical History:  Diagnosis Date  . Anxiety   . Chronic kidney disease (CKD), stage III (moderate) (HCC)   . Essential hypertension   . Headache   . Malignant neoplasm of descended right testis ([place])   . Testicular mass     Past Surgical History:  Procedure Laterality Date  . IR ANGIO INTRA EXTRACRAN SEL COM CAROTID INNOMINATE BILAT MOD SED   01/03/2018  . IR ANGIO VERTEBRAL SEL VERTEBRAL UNI L MOD SED  01/03/2018  . IR CT HEAD LTD  01/03/2018  . IR INTRA CRAN STENT  01/03/2018  . IR PERCUTANEOUS ART THROMBECTOMY/INFUSION INTRACRANIAL INC DIAG ANGIO  01/03/2018  . NO PAST SURGERIES    . ORCHIECTOMY Right 06/17/2017   Procedure: RIGHT RADICAL ORCHIECTOMY;  Surgeon: Ceasar Mons, MD;  [facility];  Service: Urology;  Laterality: Right;  . PR REMOVE ABD LYMPH NODES EXTENSIVE Bilateral 10/30/2017  . RADIOLOGY WITH ANESTHESIA N/A 01/03/2018   Procedure: CODE STROKE;  Surgeon: Luanne Bras, MD;  Location: [place];  Service: Radiology;  Laterality: N/A;    Allergies: Lactose intolerance (gi)  Medications: Prior to Admission medications   Medication Sig Start Date End Date Taking? Authorizing Provider  acetaminophen (TYLENOL) 325 MG tablet Take 1-2 tablets (325-650 mg total) by mouth every 4 (four) hours as needed for mild pain. 01/16/18   Love, Ivan Anchors, PA-C  aspirin 81 MG chewable tablet Chew 1 tablet (81 mg total) by mouth daily. 01/16/18   Hosie Poisson, MD  atorvastatin (LIPITOR) 80 MG tablet Take 1 tablet (80 mg total) by mouth daily at 6 PM. 02/09/18   Angiulli, Lavon Paganini, PA-C  cloNIDine (CATAPRES) 0.1 MG tablet Take 1 tablet (0.1 mg total) by mouth daily. 02/09/18   Angiulli, Lavon Paganini, PA-C  gabapentin (NEURONTIN) 100 MG capsule Take 1 capsule (100 mg total) by mouth at bedtime. 02/09/18   Angiulli, Lavon Paganini, PA-C  metoprolol tartrate (LOPRESSOR) 25 MG tablet Take 1 tablet (25 mg total) by mouth 2 (two) times daily. 02/09/18   Angiulli, Lavon Paganini, PA-C  Multiple Vitamin (MULTIVITAMIN WITH MINERALS) TABS tablet Take 1 tablet by mouth daily. 02/09/18  Angiulli, Lavon Paganini, PA-C  sertraline (ZOLOFT) 50 MG tablet Take 1 tablet (50 mg total) by mouth daily. 02/10/18   Angiulli, Lavon Paganini, PA-C  ticagrelor (BRILINTA) 90 MG TABS tablet Take 1 tablet (90 mg total) by mouth 2 (two) times daily. 02/09/18   Angiulli,  Lavon Paganini, PA-C  tiZANidine (ZANAFLEX) 4 MG tablet Take 1 tablet (4 mg total) by mouth at bedtime. 03/05/18   Scot Jun, FNP  traZODone (DESYREL) 100 MG tablet Take 1 tablet (100 mg total) by mouth at bedtime. 03/05/18   Scot Jun, FNP     Family History  Problem Relation Age of Onset  . High blood pressure Father     Social History   Socioeconomic History  . Marital status: Single    Spouse name: Not on file  . Number of children: Not on file  . Years of education: Not on file  . Highest education level: Not on file  Occupational History  . Not on file  Social Needs  . Financial resource strain: Not on file  . Food insecurity:    Worry: Not on file    Inability: Not on file  . Transportation needs:    Medical: Not on file    Non-medical: Not on file  Tobacco Use  . Smoking status: Current Every Day Smoker    Types: Cigars  . Smokeless tobacco: Never Used  Substance and Sexual Activity  . Alcohol use: No  . Drug use: Yes    Types: Marijuana    Comment: LAST USE MARIJUANA 06-07-17  . Sexual activity: Not on file  Lifestyle  . Physical activity:    Days per week: Not on file    Minutes per session: Not on file  . Stress: Not on file  Relationships  . Social connections:    Talks on phone: Not on file    Gets together: Not on file    Attends religious service: Not on file    Active member of club or organization: Not on file    Attends meetings of clubs or organizations: Not on file    Relationship status: Not on file  Other Topics Concern  . Not on file  Social History Narrative  . Not on file     Review of Systems: A 12 point ROS discussed and pertinent positives are indicated in the HPI above.  All other systems are negative.  Review of Systems  Constitutional: Negative for chills and fever.  HENT: Negative for hearing loss and tinnitus.   Eyes: Negative for visual disturbance.  Respiratory: Negative for shortness of breath and wheezing.     Cardiovascular: Negative for chest pain and palpitations.  Neurological: Positive for speech difficulty and weakness. Negative for dizziness, numbness and headaches.  Psychiatric/Behavioral: Negative for behavioral problems and confusion.    Physical Exam Constitutional:      General: He is not in acute distress.    Appearance: Normal appearance.  Pulmonary:     Effort: Pulmonary effort is normal. No respiratory distress.  Skin:    General: Skin is warm and dry.  Neurological:     Mental Status: He is alert and oriented to person, place, and time.  Psychiatric:        Mood and Affect: Mood normal.        Behavior: Behavior normal.        Thought Content: Thought content normal.        Judgment: Judgment normal.  Labs:  CBC: Recent Labs    02/04/18 0630 02/06/18 0517 02/09/18 0539 03/05/18 1449  WBC 12.2* 11.3* 12.3* 7.2  HGB 9.9* 10.7* 10.5* 11.3*  HCT 31.0* 32.2* 31.5* 33.2*  PLT 412* 395 366 269    COAGS: Recent Labs    01/03/18 1120  INR 1.16  APTT 23*    BMP: Recent Labs    01/26/18 0530 02/02/18 0555 02/09/18 0539 03/05/18 1449  NA 136 136 137 141  K 4.3 4.1 4.4 4.3  CL 100 99 103 102  CO2 25 28 25 23   GLUCOSE 106* 111* 102* 121*  BUN 17 22* 21* 23*  CALCIUM 9.9 9.9 9.9 9.5  CREATININE 1.03 1.15 1.13 0.97  GFRNONAA >60 >60 >60 100  GFRAA >60 >60 >60 116    LIVER FUNCTION TESTS: Recent Labs    01/09/18 0308 01/16/18 0507 02/09/18 0539 03/05/18 1449  BILITOT 0.7 0.3 0.4 0.3  AST 71* 30 16 31   ALT 64* 44 17 65*  ALKPHOS 83 139* 79 91  PROT 6.5 7.9 6.9 6.8  ALBUMIN 2.9* 3.9 3.9 4.5    Assessment and Plan:  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 Estanislado Pandy. Dr. Estanislado Pandy was present for consultation. Reviewed imaging with patient and parents. Brought to their attention was patient's basilar artery occlusion, both prior to  and following endovascular revascularization. Explained the best course of management for his stent at this time is with routine imaging scans to monitor for changes regarding stent patency.  Discussed patient's ADLs. Patient states that his strength is improving daily. Strongly advised patient to continue PT. Recommend ambulation with walker once PT is re-started.  Instructed patient to follow-up with neurology regularly.  Plan for follow-up with a CTA head/neck (with contrast) 6 months from 03/05/2017. Informed patient that our schedulers will call him to set up this imaging scan. Instructed patient to continue taking Brilinta 90 mg twice daily and Aspirin 81 mg once daily.  All questions answered and concerns addressed. Patient and parents convey understanding and agree with plan.  Thank you for this interesting consult.  I greatly enjoyed meeting Caleb Hubbard and look forward to participating in their care.  A copy of this report was sent to the requesting provider on this date.  Electronically Signed: Earley Abide, PA-C 03/30/2018, 7:30 AM   I spent a total of 40 Minutes in face to face in clinical consultation, greater than 50% of which was counseling/coordinating care for basilar artery occlusion s/p revascularization.

## 2018-03-30 NOTE — Progress Notes (Signed)
Subjective:    Patient ID: Caleb Hubbard, male    DOB: Jan 03, 1981, 38 y.o.   MRN: [id number]  HPI: Caleb Hubbard is a 38 y.o. male who is here for hospital follow up appointment. He presented to [facility] on 01/03/2018 with nausea, altered mental status and dystonic posturing. CTA was ordered.  CT Angio Neck W and WO Contrast:  IMPRESSION: 1. 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 2. No embolic source seen in the neck, although limited by significant motion artifact. MRI Brain WO Contrast:  IMPRESSION: 1. Acute numerous supra- and infratentorial posterior circulation nonhemorrhagic infarcts. 2. 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 He underwent cerebral angiogram with endovascular revascularization of occluded basilar artery followed by rescue stent of severe focal stenosis.   He was admitted to [facility] on 01/15/2018 and discharged 02/09/2018 to parents home. He hasn't received any outpatient therapy due to [facility] pending. He reports pain in his left shoulder, upper back mainly left side, also reports increase intensity and frequency of neuropathic pain in his left leg and left foot. He was instructed to increase gabapentin and send a My Chart message in a week, he and his mother verbalizes understanding.  He rates his pain 6. Also reports good appetite.   Mr. Brightwell reports somnolence with tizanidine, instructed to decrease to 1/2 tablet at HS he verbalizes understanding.   Mother in room, all questions answered.    Pain Inventory Average Pain 8 Pain Right Now 6 My pain is just hurts  In the last 24 hours, has pain interfered with the following? General activity 0 Relation with others 0 Enjoyment of life 0 What TIME of day is your pain at its worst? all Sleep (in general) Good  Pain is worse with: unsure Pain improves with: nothing Relief from Meds: na  Mobility ability to climb steps?   no do you drive?  no use a wheelchair needs help with transfers  Function not employed: date last employed . disabled: date disabled 01/03/18  Neuro/Psych weakness trouble walking  Prior Studies Any changes since last visit?  no  Physicians involved in your care Any changes since last visit?  no   Family History  Problem Relation Age of Onset  . High blood pressure Father    Social History   Socioeconomic History  . Marital status: Single    Spouse name: Not on file  . Number of children: Not on file  . Years of education: Not on file  . Highest education level: Not on file  Occupational History  . Not on file  Social Needs  . Financial resource strain: Not on file  . Food insecurity:    Worry: Not on file    Inability: Not on file  . Transportation needs:    Medical: Not on file    Non-medical: Not on file  Tobacco Use  . Smoking status: Current Every Day Smoker    Types: Cigars  . Smokeless tobacco: Never Used  Substance and Sexual Activity  . Alcohol use: No  . Drug use: Yes    Types: Marijuana    Comment: LAST USE MARIJUANA 06-07-17  . Sexual activity: Not on file  Lifestyle  . Physical activity:    Days per week: Not on file    Minutes per session: Not on file  . Stress: Not on file  Relationships  . Social connections:    Talks on phone: Not on file  Gets together: Not on file    Attends religious service: Not on file    Active member of club or organization: Not on file    Attends meetings of clubs or organizations: Not on file    Relationship status: Not on file  Other Topics Concern  . Not on file  Social History Narrative  . Not on file   Past Surgical History:  Procedure Laterality Date  . IR ANGIO INTRA EXTRACRAN SEL COM CAROTID INNOMINATE BILAT MOD SED  01/03/2018  . IR ANGIO VERTEBRAL SEL VERTEBRAL UNI L MOD SED  01/03/2018  . IR CT HEAD LTD  01/03/2018  . IR INTRA CRAN STENT  01/03/2018  . IR PERCUTANEOUS ART  THROMBECTOMY/INFUSION INTRACRANIAL INC DIAG ANGIO  01/03/2018  . NO PAST SURGERIES    . ORCHIECTOMY Right 06/17/2017   Procedure: RIGHT RADICAL ORCHIECTOMY;  Surgeon: Ceasar Mons, MD;  Location: [facility];  Service: Urology;  Laterality: Right;  . PR REMOVE ABD LYMPH NODES EXTENSIVE Bilateral 10/30/2017  . RADIOLOGY WITH ANESTHESIA N/A 01/03/2018   Procedure: CODE STROKE;  Surgeon: Luanne Bras, MD;  Location: [place];  Service: Radiology;  Laterality: N/A;   Past Medical History:  Diagnosis Date  . Anxiety   . Chronic kidney disease (CKD), stage III (moderate) (HCC)   . CVA (cerebral vascular accident) ([place]) 12/2017  . Essential hypertension   . Headache   . Malignant neoplasm of descended right testis ([place])   . Testicular mass    BP 125/87   Pulse 90   Ht 5\' 9"  (1.753 m) Comment: reported  Wt 130 lb 3.2 oz (59.1 kg)   SpO2 98%   BMI 19.23 kg/m   Opioid Risk Score:   Fall Risk Score:  `1  Depression screen PHQ 2/9  Depression screen [facility] 2/9 03/30/2018 03/05/2018  Decreased Interest 0 0  Down, Depressed, Hopeless 0 0  PHQ - 2 Score 0 0  Altered sleeping 0 0  Tired, decreased energy 0 0  Change in appetite 0 0  Feeling bad or failure about yourself  0 0  Trouble concentrating 0 0  Moving slowly or fidgety/restless 0 0  Suicidal thoughts 0 0  PHQ-9 Score 0 0    Review of Systems  Constitutional: Negative.   HENT: Negative.   Eyes: Negative.   Respiratory: Negative.   Cardiovascular: Negative.   Gastrointestinal: Negative.   Endocrine: Negative.   Genitourinary: Negative.   Musculoskeletal: Positive for gait problem.  Skin: Positive for rash.  Allergic/Immunologic: Negative.   Neurological: Positive for weakness.  Hematological: Bruises/bleeds easily.       Brillinta  Psychiatric/Behavioral: Negative.   All other systems reviewed and are negative.      Objective:   Physical Exam Vitals signs and nursing note reviewed.  Constitutional:       Appearance: Normal appearance.  Neck:     Musculoskeletal: Normal range of motion and neck supple.  Cardiovascular:     Rate and Rhythm: Normal rate and regular rhythm.     Pulses: Normal pulses.     Heart sounds: Normal heart sounds.  Pulmonary:     Effort: Pulmonary effort is normal.     Breath sounds: Normal breath sounds.  Musculoskeletal:     Comments: Normal Muscle Bulk and Muscle Testing Reveals:  Upper Extremities:RightL Full  ROM and Muscle Strength 5/5 Left: Decreased ROM and Muscle Strength 0/5 Left AC Joint Tenderness  Thoracic Hypersensitivity: T-1-T-3  Lumbar Lower Extremities Gait  Arrived in  wheelchair   Skin:    General: Skin is warm and dry.  Neurological:     Mental Status: He is alert and oriented to person, place, and time.  Psychiatric:        Mood and Affect: Mood normal.        Behavior: Behavior normal.           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 Instructed to schedule appointment with Dr. Leonie Man, he verbalizes understanding.  2. Basilar Artery Embolism: Has a scheduled appointment with IR 3. Essential Hypertension: Continue current medication regimen. PCP Following.  4. Reactive Depression: Continue current medication regimen. PCP Following. Continue to Monitor  F/U with Dr Letta Pate in 4- 6 weeks

## 2018-03-30 NOTE — Telephone Encounter (Signed)
Patient called requesting refills on  -atorvastatin (LIPITOR) 80 MG tablet [[id number]]   -sertraline (ZOLOFT) 50 MG tablet [[id number]]   -ticagrelor (BRILINTA) 90 MG TABS tablet [[id number]  -metoprolol tartrate (LOPRESSOR) 25 MG tablet [[id number]]   -gabapentin (NEURONTIN) 100 MG capsule [[id number]]  Patient would like medications to be sent to the [facility], PCP has not prescribed these medications to patient before, please follow up.

## 2018-03-30 NOTE — Patient Instructions (Addendum)
Call [facility] for Hospital Follow Up: He seen Dr. Leonie Man in the hospital   [address]  [address]     Increase Gabapentin to one capsule in the morning and one capsule at bedtime.   Send My Chart message by Friday 04/03/2018.

## 2018-03-31 MED ORDER — TICAGRELOR 90 MG PO TABS: 90.0000 mg | ORAL_TABLET | Freq: Two times a day (BID) | ORAL | 3 refills | Status: DC

## 2018-03-31 MED ORDER — GABAPENTIN 100 MG PO CAPS: 100.0000 mg | ORAL_CAPSULE | Freq: Every day | ORAL | 3 refills | Status: DC

## 2018-03-31 MED ORDER — METOPROLOL TARTRATE 25 MG PO TABS: 25.0000 mg | ORAL_TABLET | Freq: Two times a day (BID) | ORAL | 3 refills | Status: DC

## 2018-03-31 MED ORDER — SERTRALINE HCL 50 MG PO TABS: 50.0000 mg | ORAL_TABLET | Freq: Every day | ORAL | 3 refills | Status: DC

## 2018-03-31 MED ORDER — ATORVASTATIN CALCIUM 80 MG PO TABS: 80.0000 mg | ORAL_TABLET | Freq: Every day | ORAL | 3 refills | Status: DC

## 2018-03-31 MED FILL — GABAPENTIN 100 MG CAPSULE: 100 | 30 days supply | Qty: 30 | Fill #0

## 2018-03-31 MED FILL — ATORVASTATIN 80 MG TABLET: 80 | 30 days supply | Qty: 30 | Fill #0

## 2018-03-31 MED FILL — SERTRALINE HCL 50 MG TABS: 50 | 30 days supply | Qty: 30 | Fill #0

## 2018-03-31 MED FILL — METOPROLOL TARTRATE 25 MG T: 25 | 30 days supply | Qty: 60 | Fill #0

## 2018-03-31 NOTE — Telephone Encounter (Signed)
Medication sent to [facility]

## 2018-04-06 ENCOUNTER — Other Ambulatory Visit: Payer: Self-pay | Admitting: Family Medicine

## 2018-04-06 ENCOUNTER — Other Ambulatory Visit: Payer: Self-pay

## 2018-04-06 DIAGNOSIS — R7303 Prediabetes: Secondary | ICD-10-CM

## 2018-04-06 DIAGNOSIS — F4321 Adjustment disorder with depressed mood: Secondary | ICD-10-CM

## 2018-04-06 DIAGNOSIS — Z7902 Long term (current) use of antithrombotics/antiplatelets: Secondary | ICD-10-CM

## 2018-04-06 DIAGNOSIS — R1310 Dysphagia, unspecified: Secondary | ICD-10-CM

## 2018-04-06 DIAGNOSIS — C772 Secondary and unspecified malignant neoplasm of intra-abdominal lymph nodes: Secondary | ICD-10-CM

## 2018-04-06 DIAGNOSIS — Z9181 History of falling: Secondary | ICD-10-CM

## 2018-04-06 DIAGNOSIS — I10 Essential (primary) hypertension: Secondary | ICD-10-CM

## 2018-04-06 DIAGNOSIS — I69391 Dysphagia following cerebral infarction: Secondary | ICD-10-CM

## 2018-04-06 DIAGNOSIS — E785 Hyperlipidemia, unspecified: Secondary | ICD-10-CM

## 2018-04-06 DIAGNOSIS — C6291 Malignant neoplasm of right testis, unspecified whether descended or undescended: Secondary | ICD-10-CM

## 2018-04-06 DIAGNOSIS — F419 Anxiety disorder, unspecified: Secondary | ICD-10-CM

## 2018-04-06 DIAGNOSIS — I69354 Hemiplegia and hemiparesis following cerebral infarction affecting left non-dominant side: Secondary | ICD-10-CM

## 2018-04-06 DIAGNOSIS — I69320 Aphasia following cerebral infarction: Secondary | ICD-10-CM

## 2018-04-06 DIAGNOSIS — D630 Anemia in neoplastic disease: Secondary | ICD-10-CM

## 2018-04-06 DIAGNOSIS — Z7982 Long term (current) use of aspirin: Secondary | ICD-10-CM

## 2018-04-06 DIAGNOSIS — I69312 Visuospatial deficit and spatial neglect following cerebral infarction: Secondary | ICD-10-CM

## 2018-04-06 MED FILL — BRILINTA 90 MG TABS: 90 | 30 days supply | Qty: 60 | Fill #0

## 2018-04-06 MED FILL — tiZANidine HCL 4 MG TABS: 4 | 30 days supply | Qty: 30 | Fill #1

## 2018-04-06 NOTE — Patient Outreach (Signed)
First attempt to obtain mRs. No answer. Left message for return call. Called main number.

## 2018-04-07 LAB — HEPATIC FUNCTION PANEL
ALT: 24 IU/L (ref 0–44)
AST: 15 IU/L (ref 0–40)
Albumin: 4.4 g/dL (ref 4.0–5.0)
Alkaline Phosphatase: 94 IU/L (ref 39–117)
Bilirubin Total: 0.2 mg/dL (ref 0.0–1.2)
Bilirubin, Direct: 0.09 mg/dL (ref 0.00–0.40)
Total Protein: 6.6 g/dL (ref 6.0–8.5)

## 2018-04-17 ENCOUNTER — Other Ambulatory Visit: Payer: Self-pay

## 2018-04-17 NOTE — Patient Outreach (Signed)
Second attempt to obtain mRs. No answer. Left message for return call.  

## 2018-04-23 ENCOUNTER — Telehealth: Payer: Self-pay

## 2018-04-23 ENCOUNTER — Telehealth: Payer: Self-pay | Admitting: Registered Nurse

## 2018-04-23 MED ORDER — GABAPENTIN 100 MG PO CAPS: 100.0000 mg | ORAL_CAPSULE | Freq: Three times a day (TID) | ORAL | 1 refills | Status: DC

## 2018-04-23 NOTE — Telephone Encounter (Signed)
Placed a call to Mr. Dockter, he reports he's having left arm neuropathic pain, he's tolerated the gabapentin without any drowsiness. Gabapentin prescription sent.

## 2018-04-23 NOTE — Telephone Encounter (Signed)
Noted in repeated e-mail messages that eunice was attempting to retrieve information from patient in regards to how is his gabapentin helping with pain response as well as drowsiness.  Called the patient to verify and to elaborate the need for that information.  Patient stated that the medication is not making him drowsy but is not taking care of his pain level at all in his arm, stated it is currently a 5/10 on pain scale wither he takes gabapentin or not.  Told patient I would relay this information to [place] and we will be in contact with him shortly on next steps.

## 2018-04-23 NOTE — Telephone Encounter (Signed)
Noted in repeated e-mail messages that Caleb Hubbard was attempting to retrieve information from patient in regards to how is his gabapentin is helping with pain response as well as drowsiness.  Called the patient to verify and to elaborate the need for that information.  Patient stated that the medication is not making him drowsy but is not taking care of his pain level at all in his arm, stated it is currently a 5/10 on pain scale wither he takes gabapentin or not.  Told patient I would relay this information to [place] and we will be in contact with him shortly on next steps.

## 2018-04-24 ENCOUNTER — Other Ambulatory Visit: Payer: Self-pay

## 2018-04-24 MED FILL — GABAPENTIN 100 MG CAP: 100 | 30 days supply | Qty: 90 | Fill #0

## 2018-04-24 NOTE — Patient Outreach (Signed)
Telephone outreach to patient to obtain mRs was successfully completed. mRs= 4. 

## 2018-04-30 ENCOUNTER — Encounter: Payer: Self-pay | Admitting: Family Medicine

## 2018-05-03 NOTE — Addendum Note (Signed)
Addended by: Scot Jun on: 05/03/2018 09:10 PM   Modules accepted: Orders

## 2018-05-11 ENCOUNTER — Telehealth: Payer: Self-pay | Admitting: Family Medicine

## 2018-05-11 NOTE — Telephone Encounter (Signed)
Caleb Hubbard,  I received a message back via fax from [facility] that they are unable to schedule the patient for [facility] as there office doesn't have access to  Physical therapy facility.  I was able to locate one other facility. Please call the office below, prior to sending referral to inquire if they are accepting patient for physical therapy. If this facility declines the request, patient/family will need to locate a facility as [facility] [place] or near by as I am not familiar with any facilities in that area.  [place], [place],  [address]  Phone: [phone]    Thanks,  Molli Barrows, FNP         Own this business?

## 2018-05-13 NOTE — Telephone Encounter (Signed)
Good Morning   I spoke to them . They are accepting patients and I will faxed to them

## 2018-05-14 MED FILL — BRILINTA 90 MG TABS: 90 | 30 days supply | Qty: 60 | Fill #1

## 2018-05-14 MED FILL — ATORVASTATIN 80 MG TABLET: 80 | 30 days supply | Qty: 30 | Fill #1

## 2018-05-14 MED FILL — SERTRALINE HCL 50 MG TABS: 50 | 30 days supply | Qty: 30 | Fill #1

## 2018-05-14 MED FILL — tiZANidine HCL 4 MG TABS: 4 | 30 days supply | Qty: 30 | Fill #2

## 2018-05-14 MED FILL — METOPROLOL TARTRATE 25 MG T: 25 | 30 days supply | Qty: 60 | Fill #1

## 2018-05-16 MED FILL — cloNIDine HCL 0.1 MG TABS: 0.1 | 30 days supply | Qty: 60 | Fill #0

## 2018-05-18 ENCOUNTER — Telehealth: Payer: Self-pay

## 2018-05-18 NOTE — Telephone Encounter (Signed)
If pts mom call back we need consent to do video visit and to file her sons insurance.Also we need to give her web ex for download and we need her email address.    I called pts mom Merlen Gurry about her sons appt on 05/25/2018 for hospital stroke follow up.I explain we are doing video visits for new pts in the office due to the [place] [facility]. The mom stated she has a computer with a camera on it. I tried to set up her but she was on a conference meeting. She will call back to get further instructions.

## 2018-05-18 NOTE — Telephone Encounter (Signed)
APPT made video spoke with mom.

## 2018-05-18 NOTE — Telephone Encounter (Signed)
Pts mother called back and gave consent to the Video Visit and for the insurance to be billed as such. Email has been added to pts demographics for the webex information to be sent.

## 2018-05-20 ENCOUNTER — Encounter: Payer: Self-pay | Admitting: Family Medicine

## 2018-05-20 ENCOUNTER — Telehealth: Payer: Self-pay | Admitting: Family Medicine

## 2018-05-20 NOTE — Telephone Encounter (Signed)
Received a fax back from [facility] stating that they no longer offer occupational therapy. They could still do PT for patient but he would have to go elsewhere for OT.  Please advise.

## 2018-05-20 NOTE — Telephone Encounter (Signed)
Referrals & demographics faxed to [facility] per Heather's request. She will reach out to patient to schedule.

## 2018-05-20 NOTE — Telephone Encounter (Signed)
Received fax confirmation & gave copy to provider.

## 2018-05-20 NOTE — Telephone Encounter (Signed)
Notify [facility] to proceed with PT referral for patient.

## 2018-05-20 NOTE — Telephone Encounter (Signed)
Notified Venancio Poisson at [facility] to still proceed with PT referral.

## 2018-05-20 NOTE — Telephone Encounter (Signed)
Caleb Hubbard,  Please contact [facility] to find out what their office requires for a referral to be completed. Patient's mother has contacted me via [place] and prefers this location for patient to begin Physical and Occupational therapy. -[facility], phone [phone] Fax [phone]  I have two referrals in Epic from 05/03/18, however, patient is requesting this facility as their new location.

## 2018-05-25 ENCOUNTER — Ambulatory Visit (HOSPITAL_BASED_OUTPATIENT_CLINIC_OR_DEPARTMENT_OTHER): Payer: Medicaid Other | Admitting: Physical Medicine & Rehabilitation

## 2018-05-25 ENCOUNTER — Encounter: Payer: Medicaid Other | Attending: Registered Nurse

## 2018-05-25 ENCOUNTER — Other Ambulatory Visit: Payer: Self-pay

## 2018-05-25 ENCOUNTER — Encounter: Payer: Self-pay | Admitting: Physical Medicine & Rehabilitation

## 2018-05-25 ENCOUNTER — Ambulatory Visit (INDEPENDENT_AMBULATORY_CARE_PROVIDER_SITE_OTHER): Payer: Medicaid Other | Admitting: Adult Health

## 2018-05-25 ENCOUNTER — Encounter: Payer: Self-pay | Admitting: Adult Health

## 2018-05-25 VITALS — BP 141/94 | HR 70 | Ht 69.0 in | Wt 152.0 lb

## 2018-05-25 VITALS — BP 140/95

## 2018-05-25 DIAGNOSIS — I651 Occlusion and stenosis of basilar artery: Secondary | ICD-10-CM

## 2018-05-25 DIAGNOSIS — I749 Embolism and thrombosis of unspecified artery: Secondary | ICD-10-CM | POA: Diagnosis not present

## 2018-05-25 DIAGNOSIS — G8110 Spastic hemiplegia affecting unspecified side: Secondary | ICD-10-CM | POA: Diagnosis not present

## 2018-05-25 DIAGNOSIS — I639 Cerebral infarction, unspecified: Secondary | ICD-10-CM

## 2018-05-25 DIAGNOSIS — E785 Hyperlipidemia, unspecified: Secondary | ICD-10-CM | POA: Diagnosis not present

## 2018-05-25 DIAGNOSIS — I10 Essential (primary) hypertension: Secondary | ICD-10-CM

## 2018-05-25 DIAGNOSIS — R471 Dysarthria and anarthria: Secondary | ICD-10-CM

## 2018-05-25 NOTE — Patient Instructions (Signed)
Continue aspirin 81 mg daily and Brilinta  and atorvastatin for secondary stroke prevention  Continue to follow up with PCP regarding cholesterol and blood pressure management  Request obtaining repeat lipid panel at follow-up appointment with PCP  Contact therapy office where referral was sent to to potentially participate in telemedicine visits if able.  Continue to do exercises at home for continued residual deficit improvement  Follow-up with vascular surgery and repeat imaging around 07/2018 along with determination of ongoing duration of Brilinta  Continue to monitor blood pressure at home  Maintain strict control of hypertension with blood pressure goal below 130/90, diabetes with hemoglobin A1c goal below 6.5% and cholesterol with LDL cholesterol (bad cholesterol) goal below 70 mg/dL. I also advised the patient to eat a healthy diet with plenty of whole grains, cereals, fruits and vegetables, exercise regularly and maintain ideal body weight.  Followup in the future with me in 3 months or call earlier if needed       Thank you for coming to see [place] at [facility]. I hope we have been able to provide you high quality care today.  You may receive a patient satisfaction survey over the next few weeks. We would appreciate your feedback and comments so that we may continue to improve ourselves and the health of our patients.

## 2018-05-25 NOTE — Progress Notes (Signed)
[facility] [address] [place]. [place] [address] [phone]       VIRTUAL VISIT FOLLOW UP NOTE  [name] Date of Birth:  February 04, 1981 Medical Record Number:  [id number]   Reason for Referral:  hospital stroke follow up    Virtual Visit via Video Note  I connected with Caleb Hubbard on 05/25/18 at  1:15 PM EDT by a video enabled telemedicine application located remotely in my own home and verified that I am speaking with the correct person using two identifiers who was located at their own home.   I discussed the limitations of evaluation and management by telemedicine and the availability of in person appointments. The patient expressed understanding and agreed to proceed.   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 History obtained from patient and chart review. Reviewed all radiology images and labs personally.  [name] is a 38 y.o. 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 He did not receive IV t-PA due to unknown time of onset.  CT had reviewed and showed patchy low density in the bilateral cerebellum and occipital lobes.  MRI brain reviewed and showed acute numerous supra and infratentorial posterior circulation nonhemorrhagic infarcts.  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  He was admitted to neuro ICU and intubated s/p endovascular complete  revascularization of occluded basilar artery achieving TICI 2B revascularization and subsequent rescue stenting of underlying high-grade stenosis of mid basilar artery achieving TICI 2B revascularization.  TEE showed EF 65 to 70% without evidence of PFO or cardiac source of embolus.  Lower extremity venous Dopplers negative.  LDL unable to calculate due to TG 1966 on Cleviprex and propofol and repeat TG 1449.  A1c 6.4.  UDS positive for THC.  Initiated aspirin 81 mg and Brilinta 90 mg twice daily due to stent and follow-up with vascular surgery outpatient.  HTN stable and recommended restart of home BP medications.  Recommended statin use for HDL management.  Current tobacco use with smoking cessation counseling provided.  Current oncologist felt patient's cancer prognosis was good and did not recommend proceeding with any further chemotherapy.  He had resultant left hemiplegia, dysphagia, aphasia and cognitive deficits impacting overall functional status and was discharged to CIR for further therapy.  He was discharged home with his parents on 02/09/2018 with recommendation of home therapies and supervision with meals and assistance with cognitive tasks and medication management with residual deficits of dysphasia, aphasia, cognitive deficits, and spastic left hemiparesis.  Since he has been home, he endorses overall improvement of his residual deficits.  He continues to experience spastic left hemiparesis, visual difficulties and dysarthria.  He was unable to participate in therapies initially due to lack of insurance but he recently obtained insurance and was referred to outpatient therapy by his PCP.  He currently is living with his mother who helps assist with dressing and bathing but is able to transfer independently into his wheelchair.  Currently nonambulatory due to left hemiparesis.  He does endorse doing exercises at home on his own but is eager to start therapies at this time.  He continues on  tizanidine for spasticity and has noted mild improvement.  Currently on 2 mg nightly as he was unable to tolerate 4 mg dose due to fatigue.  He has been maintaining a regular diet without difficulty.  He was evaluated outpatient in Dr. Arlean Hopping office on 03/30/2018 and recommended continuation of aspirin and Brilinta and plan on repeating imaging 6 months post procedure.  He continues on aspirin and Brilinta without side effects of bleeding or bruising.  Continues on atorvastatin without side effects myalgias.  He does monitor BP at home which has been stable.  Post hospital discharge, concerns for reactive depression but he endorses improvement and continues to follow with PCP for ongoing management.  He denies ongoing use of tobacco or THC.  No further concerns at this time.  Denies new or worsening stroke/TIA symptoms.      ROS:   14 system review of systems performed and negative with exception of weakness, speech difficulty, visual loss, gait difficulty  PMH:  Past Medical History:  Diagnosis Date   Anxiety    Chronic kidney disease (CKD), stage III (moderate) (HCC)    CVA (cerebral vascular accident) ([place]) 12/2017   Essential hypertension    Headache    Malignant neoplasm of descended right testis [facility])    Testicular mass     PSH:  Past Surgical History:  Procedure Laterality Date   IR ANGIO INTRA EXTRACRAN SEL COM CAROTID INNOMINATE BILAT MOD SED  01/03/2018   IR ANGIO VERTEBRAL SEL VERTEBRAL UNI L MOD SED  01/03/2018   IR CT HEAD LTD  01/03/2018   IR INTRA CRAN STENT  01/03/2018   IR PERCUTANEOUS ART THROMBECTOMY/INFUSION INTRACRANIAL INC DIAG ANGIO  01/03/2018   NO PAST SURGERIES     ORCHIECTOMY Right 06/17/2017   Procedure: RIGHT RADICAL ORCHIECTOMY;  Surgeon: Ceasar Mons, MD;  Location: [facility];  Service: Urology;  Laterality: Right;   PR REMOVE ABD LYMPH NODES EXTENSIVE Bilateral 10/30/2017   RADIOLOGY WITH ANESTHESIA N/A 01/03/2018    Procedure: CODE STROKE;  Surgeon: Luanne Bras, MD;  Location: [facility];  Service: Radiology;  Laterality: N/A;    Social History:  Social History   Socioeconomic History   Marital status: Single    Spouse name: Not on file   Number of children: Not on file   Years of education: Not on file   Highest education level: Not on file  Occupational History   Not on file  Social Needs   Financial resource strain: Not on file   Food insecurity:    Worry: Not on file    Inability: Not on file   Transportation needs:    Medical: Not on file    Non-medical: Not on file  Tobacco Use   Smoking status: Current Every Day Smoker    Types: Cigars   Smokeless tobacco: Never Used  Substance and Sexual Activity   Alcohol use: No   Drug use: Yes    Types: Marijuana    Comment: LAST USE MARIJUANA 06-07-17   Sexual activity: Not on file  Lifestyle   Physical activity:    Days per week: Not on file    Minutes per session: Not on file   Stress: Not on file  Relationships   Social connections:    Talks on phone: Not on file  Gets together: Not on file    Attends religious service: Not on file    Active member of club or organization: Not on file    Attends meetings of clubs or organizations: Not on file    Relationship status: Not on file   Intimate partner violence:    Fear of current or ex partner: Not on file    Emotionally abused: Not on file    Physically abused: Not on file    Forced sexual activity: Not on file  Other Topics Concern   Not on file  Social History Narrative   Not on file    Family History:  Family History  Problem Relation Age of Onset   High blood pressure Father     Medications:   Current Outpatient Medications on File Prior to Visit  Medication Sig Dispense Refill   acetaminophen (TYLENOL) 325 MG tablet Take 1-2 tablets (325-650 mg total) by mouth every 4 (four) hours as needed for mild pain.     aspirin 81 MG chewable tablet  Chew 1 tablet (81 mg total) by mouth daily.     atorvastatin (LIPITOR) 80 MG tablet Take 1 tablet (80 mg total) by mouth daily at 6 PM. 30 tablet 3   cloNIDine (CATAPRES) 0.1 MG tablet Take 1 tablet (0.1 mg total) by mouth daily. 60 tablet 11   gabapentin (NEURONTIN) 100 MG capsule Take 1 capsule (100 mg total) by mouth 3 (three) times daily. (Patient not taking: Reported on 05/25/2018) 90 capsule 1   metoprolol tartrate (LOPRESSOR) 25 MG tablet Take 1 tablet (25 mg total) by mouth 2 (two) times daily. 60 tablet 3   Multiple Vitamin (MULTIVITAMIN WITH MINERALS) TABS tablet Take 1 tablet by mouth daily.     sertraline (ZOLOFT) 50 MG tablet Take 1 tablet (50 mg total) by mouth daily. 30 tablet 3   ticagrelor (BRILINTA) 90 MG TABS tablet Take 1 tablet (90 mg total) by mouth 2 (two) times daily. 60 tablet 3   tiZANidine (ZANAFLEX) 4 MG tablet Take 1 tablet (4 mg total) by mouth at bedtime. 30 tablet 3   traZODone (DESYREL) 100 MG tablet Take 1 tablet (100 mg total) by mouth at bedtime. 90 tablet 1   No current facility-administered medications on file prior to visit.     Allergies:   Allergies  Allergen Reactions   Lactose Intolerance (Gi) Nausea And Vomiting     Physical Exam  *Limited exam due to visit type*  Vitals:   05/25/18 1321  BP: (!) 140/95    Depression screen PHQ 2/9 05/25/2018  Decreased Interest 0  Down, Depressed, Hopeless 0  PHQ - 2 Score 0  Altered sleeping -  Tired, decreased energy -  Change in appetite -  Feeling bad or failure about yourself  -  Trouble concentrating -  Moving slowly or fidgety/restless -  Suicidal thoughts -  PHQ-9 Score -     General: Pleasant young African-American male, seated, in no evident distress Head: head normocephalic and atraumatic.     Neurologic Exam Mental Status: Awake and fully alert.  Mild dysarthria.  Oriented to place and time. Recent and remote memory intact. Attention span, concentration and fund of  knowledge appropriate. Mood and affect appropriate.  Cranial Nerves:  Extraocular movements full without nystagmus. Hearing intact to voice.  Left lower facial paralysis.  Shoulder shrug asymmetric on left side Motor: Minimal movement LUE showing large amount of weakness.  Is able to hold left leg and there  for 10 seconds with minimal drift.  No evidence of weakness on right side Sensory.: UTA but subjectively denies temperature variation or numbness/tingling Coordination: Decreased left hand finger tapping.  Slight difficulty on left side with heel-to-shin.  No deficits noted on right side Gait and Station: Unable to assess as he is nonambulatory.  Currently sitting in wheelchair Reflexes: UTA   NIHSS  2 Modified Rankin  3   Diagnostic Data (Labs, Imaging, Testing)  Ct Head Wo Contrast 01/03/2018 Patchy low-density in the bilateral cerebellum and occipital lobes. This pattern suggests posterior circulation acute infarcts-consider CTA. Given young age and recent chemotherapy underlying PRES is a consideration-although no symmetric cerebral edema. Metastatic disease with edema is possible but less likely given the distribution.   Ct Angio Head W Or Wo Contrast Ct Angio Neck W And/or Wo Contrast 01/03/2018 1. 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  2. No embolic source seen in the neck, although limited by significant motion artifact.   IR Intra Cran Stent 01/03/2018 IMPRESSION: Status post endovascular complete revascularization of occluded basilar artery extending from the right vertebrobasilar junction to the basilar artery to the level of the superior cerebellar arteries, with 1 pass with the 4 mm x 40 mm Solitaire X retrieval device, and 1 pass with the 5 mm x 33 mm Embotrap retrieval device achieving a TICI 2b revascularization. Subsequent rescue stenting of the underlying high-grade stenosis of mid basilar artery achieving a TICI 2b  revascularization  Mr Brain Wo Contrast 01/03/2018 1. Acute numerous supra- and infratentorial posterior circulation nonhemorrhagic infarcts.  2. 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    Bilateral Lower Extremity Venous Dopplers There is no DVT or SVT noted in the bilateral lower extremities.  TEE   Left Ventrical:65-70%, at least moderate LVH Mitral Valve:normal, no vegetation Aortic Valve:normal, no vegetation Tricuspid Valve:mild TR, no vegetation Pulmonic Valve:trivial PR Left Atrium/ Left atrial appendage:no thrombus Atrial septum:No ASD or PFO Aorta:mild non-mobile atheroma             ASSESSMENT: Rhyatt Winterbottom is a 38 y.o. 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 Vascular risk factors include HTN, HLD, tobacco use and marijuana use.  He has been stable from a stroke standpoint with residual deficits of spastic left hemiparesis, dysarthria and subjective minimal visual loss.    PLAN:  1. Stroke: Continue aspirin 81 mg daily and Brilinta  and atorvastatin 80mg  for secondary stroke prevention. Maintain strict control of hypertension with blood pressure goal below 130/90, diabetes with hemoglobin A1c goal below 6.5% and cholesterol with LDL cholesterol (bad cholesterol) goal below 70 mg/dL.  I also advised the patient to eat a healthy diet with plenty of whole grains, cereals, fruits and vegetables, exercise regularly with at least 30 minutes of continuous activity daily and maintain ideal body weight. 2. HTN: Advised to continue current treatment regimen.  Today's BP 140/95.  Advised to continue to monitor at home along with continued follow-up with PCP for management 3. HLD: Advised to  continue current treatment regimen along with continued follow-up with PCP for future prescribing and monitoring of lipid panel 4. BA occlusion s/p stent: Continue aspirin and Brilinta along with repeat imaging of CTA head/neck in 6 months post procedure as recommended by vascular  surgery 5. Left hemiparesis/dysarthria: Advised following up with outpatient therapy office as some clinics are currently doing telemedicine visits.  Highly encouraged continuation of doing home exercises for ongoing improvement.  He will also continue to follow with Dr. Letta Pate office and ongoing management of tizanidine    Follow up in 3 months or call earlier if needed   Greater than 50% of time during this 25 minute visit was spent on counseling, explanation of diagnosis of stroke, reviewing risk factor management of HTN, HLD and BA occlusion s/p stent, ongoing surveillance monitoring by IR, residual deficits, planning of further management along with potential future management, and discussion with patient and family answering all questions.    Venancio Poisson, AGNP-BC  [facility] [address] [place], [place] [address]  Phone [phone] Fax [phone] Note: This document was prepared with digital dictation and possible smart phrase technology. Any transcriptional errors that result from this process are unintentional.

## 2018-05-25 NOTE — Progress Notes (Signed)
Subjective:    Patient ID: Caleb Hubbard, male    DOB: 12/13/1980, 38 y.o.   MRN: [id number]  HPI 38 year old male with history of basilar artery thrombosis onset 01/03/2018 with resultant cerebellar and occipital infarcts His neuropathic pain has subsided he is no longer taking gabapentin.  He continues to have occasional spasms and takes 1/2 tablet of tizanidine on a as needed basis. He continues to stay with family in [place] but plans to return to [place] if he can become more independent. He has received outpatient therapy orders from his primary care physician in [place]. The patient denies any falls He is nonambulatory but can stand with assistance.  He continues to require assistance with dressing and bathing. He has had no new medical procedures or hospitalizations since last visit in February. Pain Inventory Average Pain 0 Pain Right Now 0 My pain is na  In the last 24 hours, has pain interfered with the following? General activity 0 Relation with others 0 Enjoyment of life 0 What TIME of day is your pain at its worst? na Sleep (in general) Good  Pain is worse with: na Pain improves with: na Relief from Meds: na  Mobility ability to climb steps?  no do you drive?  no use a wheelchair transfers alone  Function disabled: date disabled na I need assistance with the following:  bathing and meal prep  Neuro/Psych No problems in this area  Prior Studies Any changes since last visit?  no  Physicians involved in your care Any changes since last visit?  no   Family History  Problem Relation Age of Onset  . High blood pressure Father    Social History   Socioeconomic History  . Marital status: Single    Spouse name: Not on file  . Number of children: Not on file  . Years of education: Not on file  . Highest education level: Not on file  Occupational History  . Not on file  Social Needs  . Financial resource strain: Not on file  . Food  insecurity:    Worry: Not on file    Inability: Not on file  . Transportation needs:    Medical: Not on file    Non-medical: Not on file  Tobacco Use  . Smoking status: Current Every Day Smoker    Types: Cigars  . Smokeless tobacco: Never Used  Substance and Sexual Activity  . Alcohol use: No  . Drug use: Yes    Types: Marijuana    Comment: LAST USE MARIJUANA 06-07-17  . Sexual activity: Not on file  Lifestyle  . Physical activity:    Days per week: Not on file    Minutes per session: Not on file  . Stress: Not on file  Relationships  . Social connections:    Talks on phone: Not on file    Gets together: Not on file    Attends religious service: Not on file    Active member of club or organization: Not on file    Attends meetings of clubs or organizations: Not on file    Relationship status: Not on file  Other Topics Concern  . Not on file  Social History Narrative  . Not on file   Past Surgical History:  Procedure Laterality Date  . IR ANGIO INTRA EXTRACRAN SEL COM CAROTID INNOMINATE BILAT MOD SED  01/03/2018  . IR ANGIO VERTEBRAL SEL VERTEBRAL UNI L MOD SED  01/03/2018  . IR CT HEAD LTD  01/03/2018  .  IR INTRA CRAN STENT  01/03/2018  . IR PERCUTANEOUS ART THROMBECTOMY/INFUSION INTRACRANIAL INC DIAG ANGIO  01/03/2018  . NO PAST SURGERIES    . ORCHIECTOMY Right 06/17/2017   Procedure: RIGHT RADICAL ORCHIECTOMY;  Surgeon: Ceasar Mons, MD;  [facility]: [facility];  Service: Urology;  Laterality: Right;  . PR REMOVE ABD LYMPH NODES EXTENSIVE Bilateral 10/30/2017  . RADIOLOGY WITH ANESTHESIA N/A 01/03/2018   Procedure: CODE STROKE;  Surgeon: Luanne Bras, MD;  Location: [facility];  Service: Radiology;  Laterality: N/A;   Past Medical History:  Diagnosis Date  . Anxiety   . Chronic kidney disease (CKD), stage III (moderate) (HCC)   . CVA (cerebral vascular accident) ([place]) 12/2017  . Essential hypertension   . Headache   . Malignant neoplasm of descended right  testis ([place])   . Testicular mass    BP (!) 141/94 Comment: pt reported, webex visit  Pulse 70 Comment: pt reported, webex visit  Wt 152 lb (68.9 kg) Comment: pt reported, webex visit  BMI 22.45 kg/m   Opioid Risk Score:   Fall Risk Score:  `1  Depression screen PHQ 2/9  Depression screen [facility] 2/9 05/25/2018 03/30/2018 03/05/2018  Decreased Interest 0 0 0  Down, Depressed, Hopeless 0 0 0  PHQ - 2 Score 0 0 0  Altered sleeping - 0 0  Tired, decreased energy - 0 0  Change in appetite - 0 0  Feeling bad or failure about yourself  - 0 0  Trouble concentrating - 0 0  Moving slowly or fidgety/restless - 0 0  Suicidal thoughts - 0 0  PHQ-9 Score - 0 0    Review of Systems  Constitutional: Negative.   HENT: Negative.   Eyes: Negative.   Respiratory: Negative.   Gastrointestinal: Negative.   Endocrine: Negative.   Genitourinary: Negative.   Musculoskeletal: Negative.   Skin: Negative.   Allergic/Immunologic: Negative.   Neurological: Negative.   Hematological: Negative.   Psychiatric/Behavioral: Negative.   All other systems reviewed and are negative.      Objective:   Physical Exam  In WebEx  Patient has normal facial symmetry Speech without dysarthria but has sparse verbal output. He is unable to raise his left arm over his head he can raise his shoulder slightly Could not see his lower extremity due to Camera angle.       Assessment & Plan:  1.  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  He is approximately 4.5 months post infarcts.  He is awaiting outpatient therapy and has had primary care referral.  We discussed that I would like to see him back in approximately 6 weeks.  We can discuss functional prognosis at that time he will be approximately 6 months post stroke at that time Medical follow-up will be with local primary care physician in [place] Long-term goals are to return to  [place] however at this point prognosis for that is guarded

## 2018-05-25 NOTE — Progress Notes (Signed)
I agree with the above plan 

## 2018-06-01 ENCOUNTER — Encounter: Payer: Self-pay | Admitting: Family Medicine

## 2018-06-01 ENCOUNTER — Telehealth: Payer: Self-pay | Admitting: Family Medicine

## 2018-06-01 NOTE — Telephone Encounter (Signed)
Will send MyChart message updating patient.

## 2018-06-01 NOTE — Telephone Encounter (Addendum)
Called [facility]([phone]) to follow up. Spoke with Maudie Mercury. She states that only the OT referral was sent. I informed her that I would send PT referral to office([phone]).

## 2018-06-01 NOTE — Telephone Encounter (Signed)
Received confirmation that fax went through successfully.

## 2018-06-01 NOTE — Telephone Encounter (Signed)
Jeanette Caprice could you contact [facility] regarding Caleb Hubbard's referral. His mother messaged me to advise the facility only received a referral for OT, which I am confused because I sent both referrals for PT and OT. Could you please follow-up to advise exactly what is needed? I'm not sure if Alinda Sierras sent a duplicate request after your fax as she has noted under the referral. This may be the cause of the confusion. Please notify patient via Mychart once resolved.  Thanks,  Molli Barrows, FNP

## 2018-06-10 ENCOUNTER — Telehealth: Payer: Self-pay | Admitting: Adult Health

## 2018-06-10 NOTE — Telephone Encounter (Signed)
2 VM left for pt to call back and schedule f/u from 4/13

## 2018-06-11 MED FILL — SERTRALINE HCL 50 MG TABS: 50 | 30 days supply | Qty: 30 | Fill #2

## 2018-06-11 MED FILL — cloNIDine HCL 0.1 MG TABS: 0.1 | 30 days supply | Qty: 60 | Fill #1

## 2018-06-11 MED FILL — ATORVASTATIN 80 MG TABLET: 80 | 30 days supply | Qty: 30 | Fill #2

## 2018-06-11 MED FILL — BRILINTA 90 MG TABS: 90 | 30 days supply | Qty: 60 | Fill #2

## 2018-06-11 MED FILL — METOPROLOL TARTRATE 25 MG T: 25 | 30 days supply | Qty: 60 | Fill #2

## 2018-07-09 ENCOUNTER — Encounter: Payer: Self-pay | Admitting: Physical Medicine & Rehabilitation

## 2018-07-09 ENCOUNTER — Other Ambulatory Visit: Payer: Self-pay

## 2018-07-09 ENCOUNTER — Encounter: Payer: Medicaid Other | Attending: Registered Nurse | Admitting: Physical Medicine & Rehabilitation

## 2018-07-09 VITALS — BP 129/91 | Ht 70.0 in | Wt 169.5 lb

## 2018-07-09 DIAGNOSIS — G8110 Spastic hemiplegia affecting unspecified side: Secondary | ICD-10-CM | POA: Diagnosis not present

## 2018-07-09 DIAGNOSIS — I69322 Dysarthria following cerebral infarction: Secondary | ICD-10-CM | POA: Diagnosis not present

## 2018-07-09 NOTE — Progress Notes (Signed)
Subjective:  Web ex consented, audio and DO functioning well  Patient ID: Caleb Hubbard, male    DOB: February 05, 1981, 38 y.o.   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  He still feels quite weak in his left upper extremity but his left lower extremity is feeling a little bit stronger  Blind spot on lower right side  No falls  Dressing and bathing Mod I  No new pains  No swallowing issues  Stills needs help with fixing food, not needing to ascend steps  Hand coordination poor on left side only able to touch index to thumb   Pain Inventory Average Pain 0 Pain Right Now 0 My pain is na  In the last 24 hours, has pain interfered with the following? General activity 0 Relation with others 0 Enjoyment of life 0 What TIME of day is your pain at its worst? na Sleep (in general) Good  Pain is worse with: na Pain improves with: na Relief from Meds: 0  Mobility walk without assistance use a walker ability to climb steps?  no do you drive?  no  Function disabled: date disabled 2019 I need assistance with the following:  feeding, meal prep, household duties and shopping  Neuro/Psych weakness trouble walking  Prior Studies Any changes since last visit?  no  Physicians involved in your care Any changes since last visit?  no   Family History  Problem Relation Age of Onset  . High blood pressure Father    Social History   Socioeconomic History  . Marital status: Single    Spouse name: Not on file  . Number of children: Not on file  . Years of education: Not on file  . Highest education level: Not on file  Occupational History  . Not on file  Social Needs  . Financial resource strain: Not on file  . Food insecurity:    Worry: Not on file    Inability: Not on file  . Transportation needs:    Medical: Not on file    Non-medical: Not on file   Tobacco Use  . Smoking status: Current Every Day Smoker    Types: Cigars  . Smokeless tobacco: Never Used  Substance and Sexual Activity  . Alcohol use: No  . Drug use: Yes    Types: Marijuana    Comment: LAST USE MARIJUANA 06-07-17  . Sexual activity: Not on file  Lifestyle  . Physical activity:    Days per week: Not on file    Minutes per session: Not on file  . Stress: Not on file  Relationships  . Social connections:    Talks on phone: Not on file    Gets together: Not on file    Attends religious service: Not on file    Active member of club or organization: Not on file    Attends meetings of clubs or organizations: Not on file    Relationship status: Not on file  Other Topics Concern  . Not on file  Social History Narrative  . Not on file   Past Surgical History:  Procedure Laterality Date  . IR ANGIO INTRA EXTRACRAN SEL COM CAROTID INNOMINATE BILAT MOD SED  01/03/2018  . IR ANGIO VERTEBRAL SEL VERTEBRAL UNI L MOD SED  01/03/2018  . IR CT HEAD LTD  01/03/2018  . IR INTRA CRAN STENT  01/03/2018  . IR PERCUTANEOUS ART THROMBECTOMY/INFUSION  INTRACRANIAL INC DIAG ANGIO  01/03/2018  . NO PAST SURGERIES    . ORCHIECTOMY Right 06/17/2017   Procedure: RIGHT RADICAL ORCHIECTOMY;  Surgeon: Ceasar Mons, MD;  [facility];  Service: Urology;  Laterality: Right;  . PR REMOVE ABD LYMPH NODES EXTENSIVE Bilateral 10/30/2017  . RADIOLOGY WITH ANESTHESIA N/A 01/03/2018   Procedure: CODE STROKE;  Surgeon: Luanne Bras, MD;  Location: [facility];  Service: Radiology;  Laterality: N/A;   Past Medical History:  Diagnosis Date  . Anxiety   . Chronic kidney disease (CKD), stage III (moderate) (HCC)   . CVA (cerebral vascular accident) ([place]) 12/2017  . Essential hypertension   . Headache   . Malignant neoplasm of descended right testis ([place])   . Testicular mass    BP (!) 129/91   Ht 5\' 10"  (1.778 m)   Wt 169 lb 8 oz (76.9 kg)   BMI 24.32 kg/m   Opioid Risk  Score:   Fall Risk Score:  `1  Depression screen PHQ 2/9  Depression screen [facility] 2/9 05/25/2018 05/25/2018 03/30/2018 03/05/2018  Decreased Interest 0 0 0 0  Down, Depressed, Hopeless 0 0 0 0  PHQ - 2 Score 0 0 0 0  Altered sleeping - - 0 0  Tired, decreased energy - - 0 0  Change in appetite - - 0 0  Feeling bad or failure about yourself  - - 0 0  Trouble concentrating - - 0 0  Moving slowly or fidgety/restless - - 0 0  Suicidal thoughts - - 0 0  PHQ-9 Score - - 0 0     Review of Systems  Constitutional: Negative.   HENT: Negative.   Eyes: Negative.   Respiratory: Negative.   Cardiovascular: Negative.   Gastrointestinal: Negative.   Endocrine: Negative.   Genitourinary: Negative.   Musculoskeletal: Positive for gait problem.  Skin: Negative.   Allergic/Immunologic: Negative.   Neurological: Positive for weakness.  Hematological: Negative.   Psychiatric/Behavioral: Negative.   All other systems reviewed and are negative.      Objective:   Physical Exam  WebEx visit limited examination Speech is with dysarthria Orientation to person place time Patient is unable to elevate the left shoulder.  He is able to oppose index finger to thumb on left hand but not other digits Amb with walker 10' left foot drop, no evidence of knee instability.     Assessment & Plan:  1.  Basilar artery thrombosis with brainstem infarcts occipital and cerebellar infarcts.  He has residual left hemiparesis with foot drop.  His left upper extremity is essentially nonfunctional. His dysarthria has improved to the point where he is largely intelligible. We discussed his therapies as well as current deficits.  He is long-term plan is to return to [place] and at that point I will plan to see him in the office.  Duration of visit 65min

## 2018-07-13 MED FILL — ATORVASTATIN 80 MG TABLET: 80 | 30 days supply | Qty: 30 | Fill #3

## 2018-07-13 MED FILL — SERTRALINE HCL 50 MG TABS: 50 | 30 days supply | Qty: 30 | Fill #3

## 2018-07-13 MED FILL — tiZANidine HCL 4 MG TABS: 4 | 30 days supply | Qty: 30 | Fill #3

## 2018-07-13 MED FILL — BRILINTA 90 MG TABLET: 90 | 30 days supply | Qty: 60 | Fill #3

## 2018-07-13 MED FILL — METOPROLOL TARTRATE 25 MG T: 25 | 30 days supply | Qty: 60 | Fill #3

## 2018-08-03 ENCOUNTER — Encounter: Payer: Self-pay | Admitting: Family Medicine

## 2018-08-03 MED ORDER — CLONIDINE HCL 0.1 MG PO TABS: 0.1000 mg | ORAL_TABLET | Freq: Every day | ORAL | 1 refills | Status: AC

## 2018-08-03 MED ORDER — TICAGRELOR 90 MG PO TABS: 90.0000 mg | ORAL_TABLET | Freq: Two times a day (BID) | ORAL | 1 refills | Status: AC

## 2018-08-03 MED ORDER — ATORVASTATIN CALCIUM 80 MG PO TABS: 80.0000 mg | ORAL_TABLET | Freq: Every day | ORAL | 1 refills | Status: AC

## 2018-08-03 MED ORDER — SERTRALINE HCL 50 MG PO TABS: 50.0000 mg | ORAL_TABLET | Freq: Every day | ORAL | 1 refills | Status: AC

## 2018-08-03 MED ORDER — METOPROLOL TARTRATE 25 MG PO TABS: 25.0000 mg | ORAL_TABLET | Freq: Two times a day (BID) | ORAL | 1 refills | Status: AC

## 2018-08-04 MED FILL — BRILINTA 90 MG TABLET: 90 | 30 days supply | Qty: 60 | Fill #0

## 2018-08-04 MED FILL — cloNIDine HCL 0.1 MG TABS: 0.1 | 30 days supply | Qty: 30 | Fill #0

## 2018-08-04 MED FILL — SERTRALINE HCL 50 MG TABS: 50 | 30 days supply | Qty: 30 | Fill #0

## 2018-08-04 MED FILL — ATORVASTATIN 80 MG TABLET: 80 | 30 days supply | Qty: 30 | Fill #0

## 2018-08-04 MED FILL — METOPROLOL TARTRATE 25 MG T: 25 | 30 days supply | Qty: 60 | Fill #0

## 2018-08-06 ENCOUNTER — Encounter: Payer: Self-pay | Admitting: Physical Medicine & Rehabilitation

## 2018-08-06 ENCOUNTER — Encounter: Payer: Medicaid Other | Attending: Registered Nurse | Admitting: Physical Medicine & Rehabilitation

## 2018-08-06 ENCOUNTER — Other Ambulatory Visit: Payer: Self-pay

## 2018-08-06 VITALS — BP 147/103 | HR 83 | Ht 71.0 in | Wt 174.0 lb

## 2018-08-06 DIAGNOSIS — I69322 Dysarthria following cerebral infarction: Secondary | ICD-10-CM | POA: Diagnosis not present

## 2018-08-06 DIAGNOSIS — G8110 Spastic hemiplegia affecting unspecified side: Secondary | ICD-10-CM | POA: Diagnosis not present

## 2018-08-06 NOTE — Progress Notes (Signed)
Subjective:    Patient ID: Caleb Hubbard, male    DOB: 09/10/1980, 38 y.o.   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  He still feels quite weak in his left upper extremity but his left lower extremity is feeling a little bit stronger  attends PT, OT outpt 2x per week, has insurance approval throught the end of August Patient is living with family in the [place] area.  He plans to come back to the [place] area after his outpatient therapy has finished. The patient is not driving.  He is unable to. He still requires some assistance with putting on shoes as well as bathing transfers assistance. He denies any falls. Pain Inventory Average Pain 0 Pain Right Now 0 My pain is na  In the last 24 hours, has pain interfered with the following? General activity 0 Relation with others 0 Enjoyment of life 0 What TIME of day is your pain at its worst? na Sleep (in general) Good  Pain is worse with: na Pain improves with: na Relief from Meds: na  Mobility use a cane ability to climb steps?  yes do you drive?  no  Function disabled: date disabled na I need assistance with the following:  meal prep  Neuro/Psych trouble walking  Prior Studies Any changes since last visit?  no  Physicians involved in your care Any changes since last visit?  yes   Family History  Problem Relation Age of Onset  . High blood pressure Father    Social History   Socioeconomic History  . Marital status: Single    Spouse name: Not on file  . Number of children: Not on file  . Years of education: Not on file  . Highest education level: Not on file  Occupational History   . Not on file  Social Needs  . Financial resource strain: Not on file  . Food insecurity    Worry: Not on file    Inability: Not on file  . Transportation needs    Medical: Not on file    Non-medical: Not on file  Tobacco Use  . Smoking status: Current Every Day Smoker    Types: Cigars  . Smokeless tobacco: Never Used  Substance and Sexual Activity  . Alcohol use: No  . Drug use: Yes    Types: Marijuana    Comment: LAST USE MARIJUANA 06-07-17  . Sexual activity: Not on file  Lifestyle  . Physical activity    Days per week: Not on file    Minutes per session: Not on file  . Stress: Not on file  Relationships  . Social Herbalist on phone: Not on file    Gets together: Not on file    Attends religious service: Not on file    Active member of club or organization: Not on file    Attends meetings of clubs or organizations: Not on file    Relationship status: Not on file  Other Topics Concern  . Not on file  Social History Narrative  . Not on file   Past Surgical History:  Procedure Laterality Date  . IR ANGIO INTRA EXTRACRAN SEL COM CAROTID INNOMINATE BILAT MOD SED  01/03/2018  .  IR ANGIO VERTEBRAL SEL VERTEBRAL UNI L MOD SED  01/03/2018  . IR CT HEAD LTD  01/03/2018  . IR INTRA CRAN STENT  01/03/2018  . IR PERCUTANEOUS ART THROMBECTOMY/INFUSION INTRACRANIAL INC DIAG ANGIO  01/03/2018  . NO PAST SURGERIES    . ORCHIECTOMY Right 06/17/2017   Procedure: RIGHT RADICAL ORCHIECTOMY;  Surgeon: Ceasar Mons, MD;  [facility];  Service: Urology;  Laterality: Right;  . PR REMOVE ABD LYMPH NODES EXTENSIVE Bilateral 10/30/2017  . RADIOLOGY WITH ANESTHESIA N/A 01/03/2018   Procedure: CODE STROKE;  Surgeon: Luanne Bras, MD;  Location: [facility];  Service: Radiology;  Laterality: N/A;   Past Medical History:  Diagnosis Date  . Anxiety   . Chronic kidney disease (CKD), stage III (moderate) (HCC)   . CVA (cerebral vascular accident) ([place]) 12/2017  .  Essential hypertension   . Headache   . Malignant neoplasm of descended right testis ([place])   . Testicular mass    BP (!) 147/103 Comment: pt reported, virtual visit  Pulse 83 Comment: pt reported, virtual visit  Ht 5\' 11"  (1.803 m) Comment: pt reported, virtual visit  Wt 174 lb (78.9 kg) Comment: pt reported, virtual visit  BMI 24.27 kg/m   Opioid Risk Score:   Fall Risk Score:  `1  Depression screen PHQ 2/9  Depression screen [facility] 2/9 08/06/2018 05/25/2018 05/25/2018 03/30/2018 03/05/2018  Decreased Interest 0 0 0 0 0  Down, Depressed, Hopeless 0 0 0 0 0  PHQ - 2 Score 0 0 0 0 0  Altered sleeping - - - 0 0  Tired, decreased energy - - - 0 0  Change in appetite - - - 0 0  Feeling bad or failure about yourself  - - - 0 0  Trouble concentrating - - - 0 0  Moving slowly or fidgety/restless - - - 0 0  Suicidal thoughts - - - 0 0  PHQ-9 Score - - - 0 0    Review of Systems  Constitutional: Negative.   HENT: Negative.   Eyes: Negative.   Respiratory: Negative.   Cardiovascular: Negative.   Gastrointestinal: Negative.   Endocrine: Negative.   Genitourinary: Negative.   Musculoskeletal: Negative.   Skin: Negative.   Allergic/Immunologic: Negative.   Neurological: Negative.   Hematological: Negative.   Psychiatric/Behavioral: Negative.   All other systems reviewed and are negative.      Objective:   Physical Exam Nursing note reviewed.  Constitutional:      Appearance: He is normal weight.  Eyes:     Extraocular Movements: Extraocular movements intact.     Conjunctiva/sclera: Conjunctivae normal.     Pupils: Pupils are equal, round, and reactive to light.  Neurological:     Mental Status: He is alert and oriented to person, place, and time. Mental status is at baseline.     Comments: The patient is able to flex and extend his second and fifth digits on the left hand but is unable to flex or extend the thumb third or fourth digits. He has persistent flexion of the third  and fourth digits of the left hand. Gait was not tested He is able to partially abduct his left shoulder and partially flex and extend his left elbow Unable to get video of his lower extremity  Psychiatric:        Mood and Affect: Mood normal.        Behavior: Behavior normal.           Assessment &  Plan:  1.  Basilar artery thrombosis with brainstem infarcts occipital and cerebellar infarcts.  He has residual left hemiparesis  His left upper extremity is essentially nonfunctional. He still requires assistance for complex ADLs as well as some aspects of simple ADLs.  He is ambulating with a quad cane without physical assistance and denies falls. Overall making good progress. I would recommend continue outpatient PT OT. Will see him after he returns to [place].  May need evaluation for possible botulinum toxin injection to the finger flexors of the left hand. Discussed with patient agrees with plan Duration of visit was 12 minutes

## 2018-08-06 NOTE — Patient Instructions (Signed)
We may need to perform Botox injection if your muscle tone remains elevated in the finger flexors of the left hand

## 2018-09-01 ENCOUNTER — Other Ambulatory Visit (HOSPITAL_COMMUNITY): Payer: Self-pay | Admitting: Interventional Radiology

## 2018-09-01 DIAGNOSIS — I639 Cerebral infarction, unspecified: Secondary | ICD-10-CM

## 2018-09-08 MED FILL — ATORVASTATIN 80 MG TABLET: 80 | 30 days supply | Qty: 30 | Fill #1

## 2018-09-08 MED FILL — SERTRALINE HCL 50 MG TABS: 50 | 30 days supply | Qty: 30 | Fill #1

## 2018-09-08 MED FILL — BRILINTA 90 MG TABLET: 90 | 30 days supply | Qty: 60 | Fill #1

## 2018-09-08 MED FILL — METOPROLOL TARTRATE 25 MG T: 25 | 30 days supply | Qty: 60 | Fill #1

## 2018-10-08 ENCOUNTER — Encounter: Payer: Self-pay | Admitting: Family Medicine

## 2018-10-13 ENCOUNTER — Ambulatory Visit (HOSPITAL_COMMUNITY)
Admission: RE | Admit: 2018-10-13 | Discharge: 2018-10-13 | Disposition: A | Discharge status: Home / Self Care | Payer: Medicaid Other | Source: Ambulatory Visit | Attending: Interventional Radiology | Admitting: Interventional Radiology

## 2018-10-13 ENCOUNTER — Encounter: Payer: Self-pay | Admitting: Physical Medicine & Rehabilitation

## 2018-10-13 ENCOUNTER — Encounter: Payer: Medicaid Other | Attending: Registered Nurse | Admitting: Physical Medicine & Rehabilitation

## 2018-10-13 ENCOUNTER — Other Ambulatory Visit: Payer: Self-pay

## 2018-10-13 VITALS — BP 131/87 | HR 97 | Temp 98.7°F | Ht 70.0 in | Wt 192.0 lb

## 2018-10-13 DIAGNOSIS — N183 Chronic kidney disease, stage 3 (moderate): Secondary | ICD-10-CM | POA: Diagnosis not present

## 2018-10-13 DIAGNOSIS — G8110 Spastic hemiplegia affecting unspecified side: Secondary | ICD-10-CM | POA: Diagnosis not present

## 2018-10-13 DIAGNOSIS — I69354 Hemiplegia and hemiparesis following cerebral infarction affecting left non-dominant side: Secondary | ICD-10-CM | POA: Diagnosis not present

## 2018-10-13 DIAGNOSIS — Z8547 Personal history of malignant neoplasm of testis: Secondary | ICD-10-CM | POA: Diagnosis not present

## 2018-10-13 DIAGNOSIS — M7918 Myalgia, other site: Secondary | ICD-10-CM | POA: Diagnosis not present

## 2018-10-13 DIAGNOSIS — I639 Cerebral infarction, unspecified: Secondary | ICD-10-CM | POA: Diagnosis not present

## 2018-10-13 DIAGNOSIS — Z8249 Family history of ischemic heart disease and other diseases of the circulatory system: Secondary | ICD-10-CM | POA: Insufficient documentation

## 2018-10-13 DIAGNOSIS — I129 Hypertensive chronic kidney disease with stage 1 through stage 4 chronic kidney disease, or unspecified chronic kidney disease: Secondary | ICD-10-CM | POA: Insufficient documentation

## 2018-10-13 DIAGNOSIS — F17290 Nicotine dependence, other tobacco product, uncomplicated: Secondary | ICD-10-CM | POA: Insufficient documentation

## 2018-10-13 MED ORDER — IOHEXOL 350 MG/ML SOLN
100.0000 mL | Freq: Once | INTRAVENOUS | Status: AC | PRN
  Administered 2018-10-13: 11:00:00 100 mL via INTRAVENOUS

## 2018-10-13 MED ORDER — SODIUM CHLORIDE (PF) 0.9 % IJ SOLN
INTRAMUSCULAR | Status: AC
  Filled 2018-10-13: qty 50

## 2018-10-13 NOTE — Patient Instructions (Signed)
Trigger Point Injection Trigger points are areas where you have pain. A trigger point injection is a shot given in the trigger point to help relieve pain for a few days to a few months. Common places for trigger points include:  The neck.  The shoulders.  The upper back.  The lower back. A trigger point injection will not cure long-term (chronic) pain permanently. These injections do not always work for every person. For some people, they can help to relieve pain for a few days to a few months. Tell a health care provider about:  Any allergies you have.  All medicines you are taking, including vitamins, herbs, eye drops, creams, and over-the-counter medicines.  Any problems you or family members have had with anesthetic medicines.  Any blood disorders you have.  Any surgeries you have had.  Any medical conditions you have. What are the risks? Generally, this is a safe procedure. However, problems may occur, including:  Infection.  Bleeding or bruising.  Allergic reaction to the injected medicine.  Irritation of the skin around the injection site. What happens before the procedure? Ask your health care provider about:  Changing or stopping your regular medicines. This is especially important if you are taking diabetes medicines or blood thinners.  Taking medicines such as aspirin and ibuprofen. These medicines can thin your blood. Do not take these medicines unless your health care provider tells you to take them.  Taking over-the-counter medicines, vitamins, herbs, and supplements. What happens during the procedure?   Your health care provider will feel for trigger points. A marker may be used to circle the area for the injection.  The skin over the trigger point will be washed with a germ-killing (antiseptic) solution.  A thin needle is used for the injection. You may feel pain or a twitching feeling when the needle enters the trigger point.  A numbing solution may  be injected into the trigger point. Sometimes a medicine to keep down inflammation is also injected.  Your health care provider may move the needle around the area where the trigger point is located until the tightness and twitching goes away.  After the injection, your health care provider may put gentle pressure over the injection site.  The injection site will be covered with a bandage (dressing). The procedure may vary among health care providers and hospitals. What can I expect after treatment? After treatment, you may have:  Soreness and stiffness for 1-2 days.  A dressing. This can be taken off in a few hours or as told by your health care provider. Follow these instructions at home: Injection site care  Remove your dressing as told by your health care provider.  Check your injection site every day for signs of infection. Check for: ? Redness, swelling, or pain. ? Fluid or blood. ? Warmth. ? Pus or a bad smell. Managing pain, stiffness, and swelling  If directed, put ice on the affected area. ? Put ice in a plastic bag. ? Place a towel between your skin and the bag. ? Leave the ice on for 20 minutes, 2-3 times a day. General instructions  If you were asked to stop your regular medicines, ask your health care provider when you may start taking them again.  Return to your normal activities as told by your health care provider. Ask your health care provider what activities are safe for you.  Do not take baths, swim, or use a hot tub until your health care provider approves.    You may be asked to see an occupational or physical therapist for exercises that reduce muscle strain and stretch the area of the trigger point.  Keep all follow-up visits as told by your health care provider. This is important. Contact a health care provider if:  Your pain comes back, and it is worse than before the injection. You may need more injections.  You have chills or a fever.  The  injection site becomes more painful, red, swollen, or warm to the touch. Summary  A trigger point injection is a shot given in the trigger point to help relieve pain for a few days to a few months.  Common places for trigger point injections are the neck, shoulder, upper back, and lower back.  These injections do not always work for every person, but for some people, the injections can help to relieve pain for a few days to a few months.  Contact a health care provider if symptoms come back or they are worse than before treatment. Also, get help if the injection site becomes more painful, red, swollen, or warm to the touch. This information is not intended to replace advice given to you by your health care provider. Make sure you discuss any questions you have with your health care provider. Document Released: 01/17/2011 Document Revised: 03/11/2018 Document Reviewed: 03/11/2018 Elsevier Patient Education  2020 Elsevier Inc[employer]  

## 2018-10-13 NOTE — Progress Notes (Signed)
Subjective:    Patient ID: Caleb Hubbard, male    DOB: 1980-08-18, 38 y.o.   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   Still living in [place], has PCP there New BP med is working better  Right sided neck and low back  Dressing and bathing Mod I using tub seat and grab bar No falls  Left upper ext still weak Used all 27 visits for Outpt PT, OT  Pain Inventory Average Pain 10 Pain Right Now 10 My pain is aching  In the last 24 hours, has pain interfered with the following? General activity 5 Relation with others 5 Enjoyment of life 0 What TIME of day is your pain at its worst? all Sleep (in general) Poor  Pain is worse with: . Pain improves with: . Relief from Meds: na  Mobility walk with assistance use a walker Do you have any goals in this area?  no  Function disabled: date disabled . I need assistance with the following:  meal prep and shopping  Neuro/Psych weakness trouble walking  Prior Studies Any changes since last visit?  no  Physicians involved in your care Primary care [facility]   Family History  Problem Relation Age of Onset  . High blood pressure Father    Social History   Socioeconomic History  . Marital status: Single    Spouse name: Not on file  . Number of children: Not on file  . Years of education: Not on file  . Highest education level: Not on file  Occupational History  . Not on file  Social Needs  . Financial resource strain: Not on file  . Food insecurity    Worry: Not on file    Inability: Not on file  . Transportation needs    Medical: Not on file    Non-medical: Not on file  Tobacco Use  . Smoking status: Current Every Day Smoker    Types: Cigars  . Smokeless tobacco: Never Used  Substance and Sexual Activity  . Alcohol use: No  . Drug use: Yes    Types: Marijuana    Comment: LAST USE  MARIJUANA 06-07-17  . Sexual activity: Not on file  Lifestyle  . Physical activity    Days per week: Not on file    Minutes per session: Not on file  . Stress: Not on file  Relationships  . Social Herbalist on phone: Not on file    Gets together: Not on file    Attends religious service: Not on file    Active member of club or organization: Not on file    Attends meetings of clubs or organizations: Not on file    Relationship status: Not on file  Other Topics Concern  . Not on file  Social History Narrative  . Not on file   Past Surgical History:  Procedure Laterality Date  . IR ANGIO INTRA EXTRACRAN SEL COM CAROTID INNOMINATE BILAT MOD SED  01/03/2018  . IR ANGIO VERTEBRAL SEL VERTEBRAL UNI L MOD SED  01/03/2018  . IR CT HEAD LTD  01/03/2018  . IR INTRA CRAN STENT  01/03/2018  . IR PERCUTANEOUS ART THROMBECTOMY/INFUSION INTRACRANIAL INC DIAG ANGIO  01/03/2018  . NO PAST SURGERIES    . ORCHIECTOMY Right 06/17/2017   Procedure: RIGHT RADICAL ORCHIECTOMY;  Surgeon: Ceasar Mons, MD;  [facility];  Service:  Urology;  Laterality: Right;  . PR REMOVE ABD LYMPH NODES EXTENSIVE Bilateral 10/30/2017  . RADIOLOGY WITH ANESTHESIA N/A 01/03/2018   Procedure: CODE STROKE;  Surgeon: Luanne Bras, MD;  Location: [facility];  Service: Radiology;  Laterality: N/A;   Past Medical History:  Diagnosis Date  . Anxiety   . Chronic kidney disease (CKD), stage III (moderate) (HCC)   . CVA (cerebral vascular accident) ([place]) 12/2017  . Essential hypertension   . Headache   . Malignant neoplasm of descended right testis ([place])   . Testicular mass    BP 131/87   Pulse 97   Temp 98.7 F (37.1 C)   Ht 5\' 10"  (1.778 m)   Wt 192 lb (87.1 kg)   SpO2 95%   BMI 27.55 kg/m   Opioid Risk Score:   Fall Risk Score:  `1  Depression screen PHQ 2/9  Depression screen [facility] 2/9 08/06/2018 05/25/2018 05/25/2018 03/30/2018 03/05/2018  Decreased Interest 0 0 0 0 0  Down, Depressed,  Hopeless 0 0 0 0 0  PHQ - 2 Score 0 0 0 0 0  Altered sleeping - - - 0 0  Tired, decreased energy - - - 0 0  Change in appetite - - - 0 0  Feeling bad or failure about yourself  - - - 0 0  Trouble concentrating - - - 0 0  Moving slowly or fidgety/restless - - - 0 0  Suicidal thoughts - - - 0 0  PHQ-9 Score - - - 0 0     Review of Systems  Constitutional: Negative.   HENT: Negative.   Eyes: Negative.   Respiratory: Negative.   Cardiovascular: Negative.   Gastrointestinal: Negative.   Endocrine: Negative.   Genitourinary: Negative.   Musculoskeletal: Negative.   Skin: Negative.   Allergic/Immunologic: Negative.   Neurological: Negative.   Hematological: Negative.   Psychiatric/Behavioral: Negative.   All other systems reviewed and are negative.      Objective:   Physical Exam  Tenderness over the right upper trap, Right levator at upper medial scapular border, Left infraspinatus fossa and Rhomboid area  Tone MAS 3 in Left FDP and L FDS, MAS 3 in L elbow flexors  Motor strenght 3-    Assessment & Plan:  1. 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  Has tried ICY Hot and ibuprofen without relief pain, interferes with activity.  Out of therapy visits until 2021.  Will do trigger points  Trigger Point Injection  Indication: RIght cervical periscapular Myofascial pain not relieved by medication management and other conservative care.  Informed consent was obtained after describing risk and benefits of the procedure with the patient, this includes bleeding, bruising, infection and medication side effects.  The patient wishes to proceed and has given written consent.  The patient was placed in a seated position.  The Right upper trap, levator scapular  infraspinatus and rhomboid  area was marked and prepped with Betadine.  It was entered with a 25-gauge 1-1/2 inch needle and 1 mL of 1% lidocaine was injected into each of 4 trigger points, after negative draw back for blood.  The patient tolerated the procedure well.  Post procedure instructions were given.

## 2018-10-20 ENCOUNTER — Other Ambulatory Visit: Payer: Self-pay | Admitting: Physical Medicine & Rehabilitation

## 2018-10-20 MED ORDER — TIZANIDINE HCL 4 MG PO TABS: 4.0000 mg | ORAL_TABLET | Freq: Three times a day (TID) | ORAL | 3 refills | Status: AC | PRN

## 2018-10-20 MED FILL — tiZANidine HCL 4 MG TABS: 4 | 30 days supply | Qty: 90 | Fill #0

## 2018-10-22 NOTE — Telephone Encounter (Signed)
Question from patient 

## 2018-10-28 ENCOUNTER — Telehealth (HOSPITAL_COMMUNITY): Payer: Self-pay

## 2018-10-28 NOTE — Telephone Encounter (Signed)
Pt agreed to f/u in 6 months with cta head/neck. AW 

## 2018-11-10 ENCOUNTER — Encounter: Payer: Medicaid Other | Admitting: Physical Medicine & Rehabilitation

## 2018-12-07 ENCOUNTER — Encounter: Payer: Self-pay | Admitting: Physical Medicine & Rehabilitation

## 2018-12-07 ENCOUNTER — Encounter: Payer: Medicaid Other | Attending: Registered Nurse | Admitting: Physical Medicine & Rehabilitation

## 2018-12-07 ENCOUNTER — Other Ambulatory Visit: Payer: Self-pay

## 2018-12-07 VITALS — BP 122/85 | HR 95 | Temp 97.7°F | Ht 70.0 in | Wt 189.0 lb

## 2018-12-07 DIAGNOSIS — G8110 Spastic hemiplegia affecting unspecified side: Secondary | ICD-10-CM | POA: Diagnosis present

## 2018-12-07 NOTE — Patient Instructions (Addendum)
Please make an appt so we can evaluate your R arm and Right leg pain.  There are several potential causes  You received a Botox injection today. You may experience soreness at the needle injection sites. Please call us if any of the injection sites turns red after a couple days or if there is any drainage. You may experience muscle weakness as a result of Botox. This would improve with time but can take several weeks to improve. The Botox should start working in about one week. The Botox usually last 3 months. The injection can be repeated every 3 months as needed.

## 2018-12-07 NOTE — Progress Notes (Signed)
Botox Injection for spasticity using needle EMG guidance  Dilution: 50 Units/ml Indication: Severe spasticity which interferes with ADL,mobility and/or  hygiene and is unresponsive to medication management and other conservative care Informed consent was obtained after describing risks and benefits of the procedure with the patient. This includes bleeding, bruising, infection, excessive weakness, or medication side effects. A REMS form is on file and signed. Needle: 27g 1" needle electrode Number of units per muscle 50U L FDS, 50U L FDP 100U L biceps All injections were done after obtaining appropriate EMG activity and after negative drawback for blood. The patient tolerated the procedure well. Post procedure instructions were given. A followup appointment was made.

## 2019-01-05 ENCOUNTER — Ambulatory Visit: Payer: Medicaid Other | Admitting: Physical Medicine & Rehabilitation

## 2019-01-19 ENCOUNTER — Encounter: Payer: Medicaid Other | Admitting: Physical Medicine & Rehabilitation

## 2019-01-26 ENCOUNTER — Other Ambulatory Visit: Payer: Self-pay

## 2019-01-26 ENCOUNTER — Encounter: Payer: Self-pay | Admitting: Physical Medicine & Rehabilitation

## 2019-01-26 ENCOUNTER — Encounter: Payer: Medicaid Other | Attending: Registered Nurse | Admitting: Physical Medicine & Rehabilitation

## 2019-01-26 VITALS — BP 134/95 | HR 89 | Temp 97.7°F | Ht 71.0 in | Wt 202.0 lb

## 2019-01-26 DIAGNOSIS — G8110 Spastic hemiplegia affecting unspecified side: Secondary | ICD-10-CM | POA: Diagnosis present

## 2019-01-26 MED ORDER — GABAPENTIN 300 MG PO CAPS: 300.0000 mg | ORAL_CAPSULE | Freq: Three times a day (TID) | ORAL | 1 refills | Status: AC

## 2019-01-26 NOTE — Patient Instructions (Addendum)
THere are several rehabilitation doctors in [place] Dr Theodora Blow  Medical support stocking , knee high

## 2019-01-26 NOTE — Progress Notes (Signed)
Subjective:    Patient ID: Caleb Hubbard, male    DOB: April 05, 1980, 38 y.o.   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  He has special shoes that did not respond to PT OT and medication management including tizanidine Some intermittent Right arm numbness which resolves when he lays down.  Has more consistent RLE numbness He has been prescribed gabapentin for this 100 mg 3 times daily by his primary care physician feels like it helps a little bit but not strong enough.  He has had no side effects from this medication.  Had botulinum toxin injection 12/07/2018 to the following muscles 50U L FDS, 50U L FDP 100U L biceps Pain Inventory Average Pain 6 Pain Right Now 6 My pain is sharp  In the last 24 hours, has pain interfered with the following? General activity 6 Relation with others 6 Enjoyment of life 6 What TIME of day is your pain at its worst? morning Sleep (in general) Good  Pain is worse with: walking Pain improves with: . Relief from Meds: .  Mobility use a cane ability to climb steps?  yes do you drive?  no  Function disabled: date disabled .  Neuro/Psych numbness depression  Prior Studies Any changes since last visit?  no  Physicians involved in your care Any changes since last visit?  no   Family History  Problem Relation Age of Onset  . High blood pressure Father    Social History   Socioeconomic History  . Marital status: Single    Spouse name: Not on file  . Number of children: Not on file  . Years of education: Not on file  . Highest education level: Not on file  Occupational History  . Not on file  Tobacco Use  . Smoking status: Current Every Day Smoker    Types: Cigars  . Smokeless tobacco: Never Used  Substance and Sexual Activity  . Alcohol use: No  . Drug use: Yes    Types: Marijuana    Comment: LAST USE  MARIJUANA 06-07-17  . Sexual activity: Not on file  Other Topics Concern  . Not on file  Social History Narrative  . Not on file   Social Determinants of Health   Financial Resource Strain:   . Difficulty of Paying Living Expenses: Not on file  Food Insecurity:   . Worried About Charity fundraiser in the Last Year: Not on file  . Ran Out of Food in the Last Year: Not on file  Transportation Needs:   . Lack of Transportation (Medical): Not on file  . Lack of Transportation (Non-Medical): Not on file  Physical Activity:   . Days of Exercise per Week: Not on file  . Minutes of Exercise per Session: Not on file  Stress:   . Feeling of Stress : Not on file  Social Connections:   . Frequency of Communication with Friends and Family: Not on file  . Frequency of Social Gatherings with Friends and Family: Not on file  . Attends Religious Services: Not on file  . Active Member of Clubs or Organizations: Not on file  . Attends Archivist Meetings: Not on file  . Marital Status: Not on file   Past Surgical History:  Procedure Laterality Date  . IR ANGIO INTRA EXTRACRAN SEL COM CAROTID INNOMINATE BILAT MOD SED  01/03/2018  . IR ANGIO VERTEBRAL  SEL VERTEBRAL UNI L MOD SED  01/03/2018  . IR CT HEAD LTD  01/03/2018  . IR INTRA CRAN STENT  01/03/2018  . IR PERCUTANEOUS ART THROMBECTOMY/INFUSION INTRACRANIAL INC DIAG ANGIO  01/03/2018  . NO PAST SURGERIES    . ORCHIECTOMY Right 06/17/2017   Procedure: RIGHT RADICAL ORCHIECTOMY;  Surgeon: Ceasar Mons, MD;  Location: [facility];  Service: Urology;  Laterality: Right;  . PR REMOVE ABD LYMPH NODES EXTENSIVE Bilateral 10/30/2017  . RADIOLOGY WITH ANESTHESIA N/A 01/03/2018   Procedure: CODE STROKE;  Surgeon: Luanne Bras, MD;  Location: [facility];  Service: Radiology;  Laterality: N/A;   Past Medical History:  Diagnosis Date  . Anxiety   . Chronic kidney disease (CKD), stage III (moderate)   . CVA (cerebral vascular  accident) ([place]) 12/2017  . Essential hypertension   . Headache   . 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 Testicular mass    BP (!) 134/95   Pulse 89   Temp 97.7 F (36.5 C)   Ht 5\' 11"  (1.803 m)   Wt 202 lb (91.6 kg)   SpO2 95%   BMI 28.17 kg/m   Opioid Risk Score:   Fall Risk Score:  `1  Depression screen PHQ 2/9  Depression screen [facility] 2/9 08/06/2018 05/25/2018 05/25/2018 03/30/2018 03/05/2018  Decreased Interest 0 0 0 0 0  Down, Depressed, Hopeless 0 0 0 0 0  PHQ - 2 Score 0 0 0 0 0  Altered sleeping - - - 0 0  Tired, decreased energy - - - 0 0  Change in appetite - - - 0 0  Feeling bad or failure about yourself  - - - 0 0  Trouble concentrating - - - 0 0  Moving slowly or fidgety/restless - - - 0 0  Suicidal thoughts - - - 0 0  PHQ-9 Score - - - 0 0     Review of Systems  Constitutional: Negative.   HENT: Negative.   Eyes: Negative.   Respiratory: Negative.   Cardiovascular: Negative.   Gastrointestinal: Negative.   Endocrine: Negative.   Genitourinary: Negative.   Musculoskeletal: Positive for gait problem.  Allergic/Immunologic: Negative.   Neurological: Positive for numbness.  Hematological: Negative.   Psychiatric/Behavioral: Positive for dysphoric mood.  All other systems reviewed and are negative.      Objective:   Physical Exam Vitals and nursing note reviewed.  Eyes:     Extraocular Movements: Extraocular movements intact.     Conjunctiva/sclera: Conjunctivae normal.     Pupils: Pupils are equal, round, and reactive to light.  Cardiovascular:     Rate and Rhythm: Normal rate and regular rhythm.  Pulmonary:     Effort: Pulmonary effort is normal.     Breath sounds: Normal breath sounds.  Abdominal:     General: Abdomen is flat.     Palpations: Abdomen is soft.  Musculoskeletal:        General: No swelling.     Comments: Patient has stiffness in his PIP DIP and MCPs on the left hand  Skin:    General: Skin is warm and dry.    Neurological:     General: No focal deficit present.     Mental Status: He is alert and oriented to person, place, and time.     Comments: Tone is MAS 2 at the finger flexors 3 of the thumb flexor 2 at the wrist flexor and elbow flexor.  Motor strength is 3 -/5  in the left deltoid by stress of grip  4 -/5 strength in the left hip flexor knee extensor and 3 - at the ankle dorsiflexor  Psychiatric:        Mood and Affect: Mood normal.        Speech: Speech is delayed. Speech is not slurred.        Behavior: Behavior normal.           Assessment & Plan:  #1.  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  He did get some reduction of his spasticity from preinjection to post injection but the patient does not feel any particular benefit from this.  No improvement with his self-care, no improvements with pain or stiffness. At this point will hold off on further reinjection We will make an a referral to outpatient PT OT which can start of the new year he will be going to therapy in [place]. He plans to move to [place] but he is not sure about the timeframe.  We discussed that he will need to get a new primary physician as well as physical medicine rehab doctor and neurologist once he does move.  I did give him the name of Dr. Theodora Blow, physical medicine rehabilitation, in [place]

## 2019-05-18 ENCOUNTER — Telehealth (HOSPITAL_COMMUNITY): Payer: Self-pay

## 2019-05-18 NOTE — Telephone Encounter (Signed)
Called to schedule cta head/neck, no answer, left vm. AW 

## 2019-06-14 ENCOUNTER — Telehealth (HOSPITAL_COMMUNITY): Payer: Self-pay

## 2019-06-14 NOTE — Telephone Encounter (Signed)
Called to schedule cta, no answer, left vm. AW  

## 2019-07-13 ENCOUNTER — Telehealth (HOSPITAL_COMMUNITY): Payer: Self-pay

## 2019-07-13 NOTE — Telephone Encounter (Signed)
Called to schedule cta head/neck. Pt will call back to schedule when he gets a ride. AW

## 2019-07-14 ENCOUNTER — Telehealth (HOSPITAL_COMMUNITY): Payer: Self-pay

## 2019-07-14 ENCOUNTER — Other Ambulatory Visit (HOSPITAL_COMMUNITY): Payer: Self-pay | Admitting: Interventional Radiology

## 2019-07-14 DIAGNOSIS — I639 Cerebral infarction, unspecified: Secondary | ICD-10-CM

## 2019-07-14 NOTE — Telephone Encounter (Signed)
Called to schedule f/u cta head/neck. Pt's mother would like to find a doctor that can follow Mr. Decaprio in [place]. It is getting hard for them to drive back and forth when they can see someone there. She will speak with is PCP to send a referral. She will call me back if she changes her mind. AW

## 2019-11-27 IMAGING — CT CT HEAD W/O CM
3 of 6 series · 14 of 47 positions shown, 17 images · non-contrast
Comparison: None.

CLINICAL DATA: Altered level of consciousness. Seizure-like
activity

EXAM:
CT HEAD WITHOUT CONTRAST
TECHNIQUE: Contiguous axial images were obtained from the base of the skull
through the vertex without intravenous contrast.

[Series 2: head wo · axial · 0.47mm/px · z∈[-94,+26]mm · 9 of 30 slices shown, 12 images]
[im 3/30  brain]
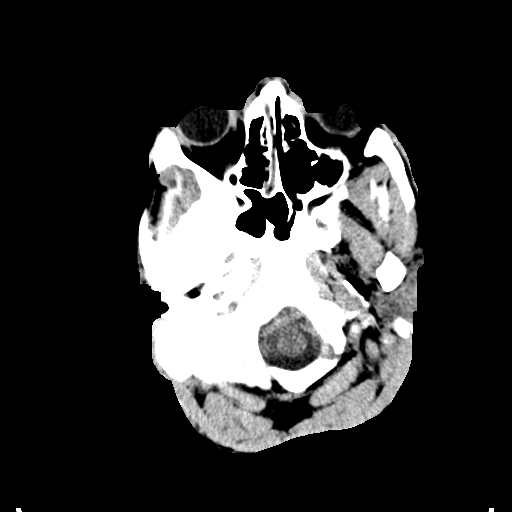
[im 3/30  bone]
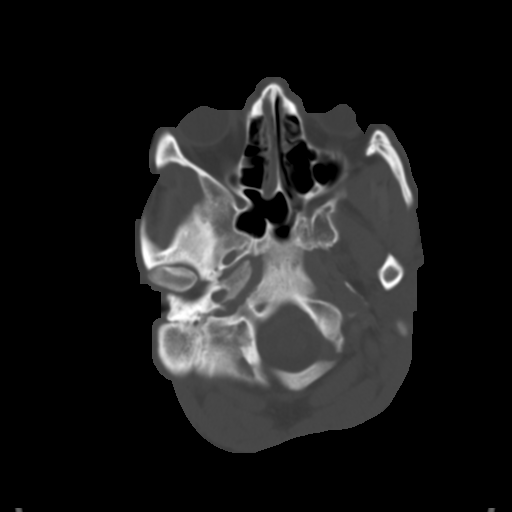
[im 7/30  brain]
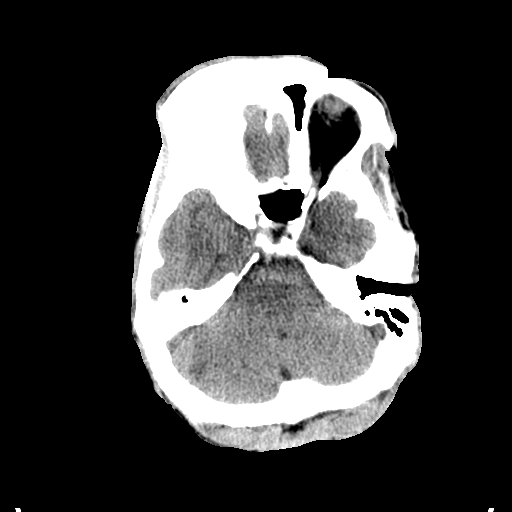
[im 9/30  brain]
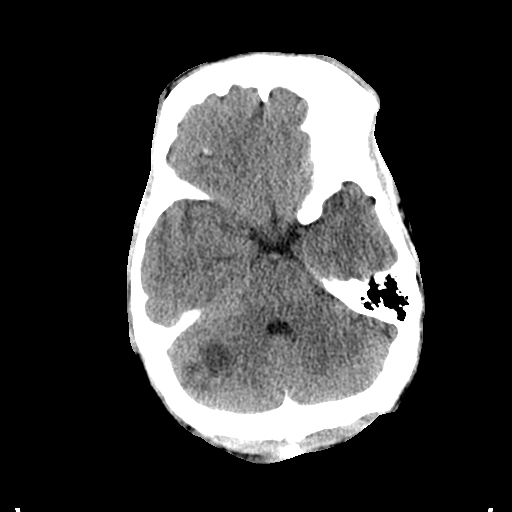
[im 13/30  brain]
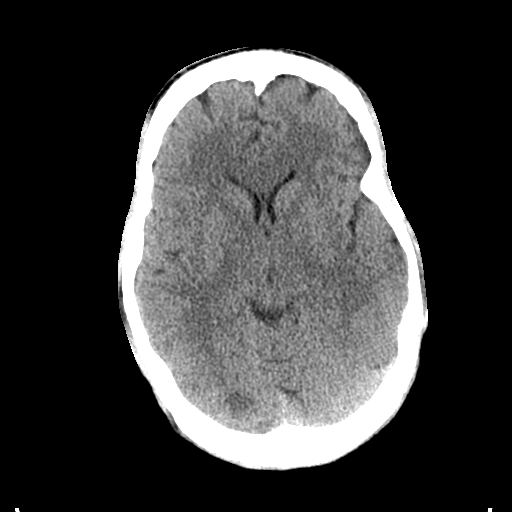
[im 15/30  brain]
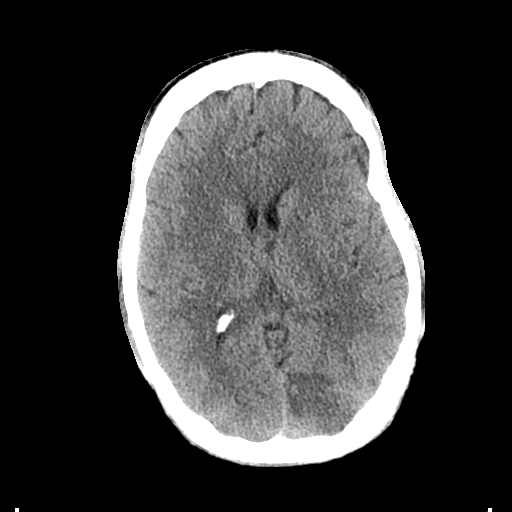
[im 15/30  bone]
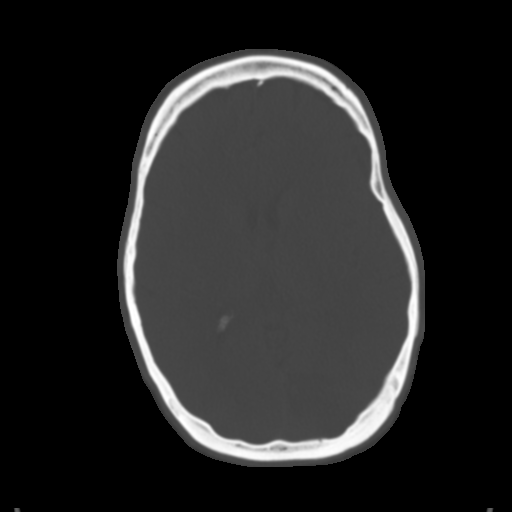
[im 17/30  brain]
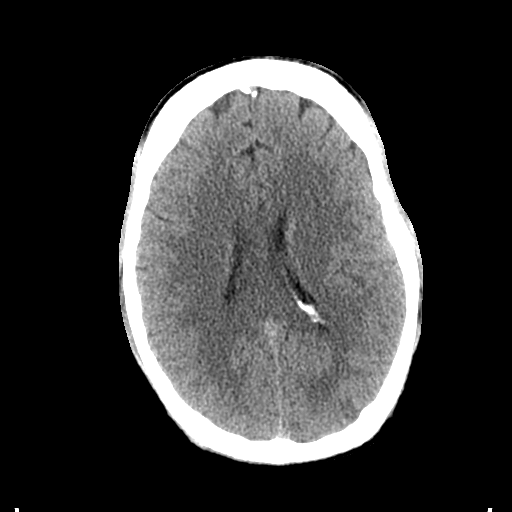
[im 21/30  brain]
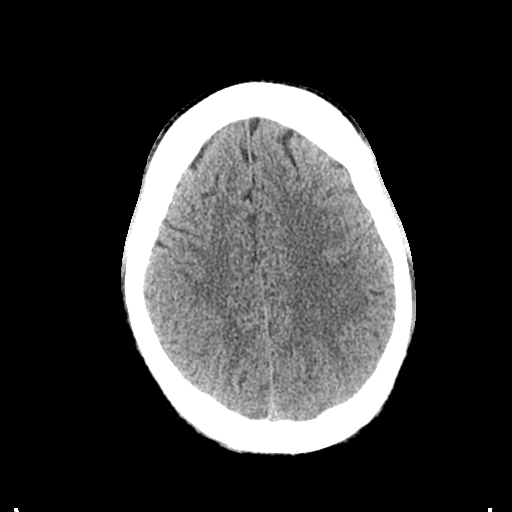
[im 23/30  brain]
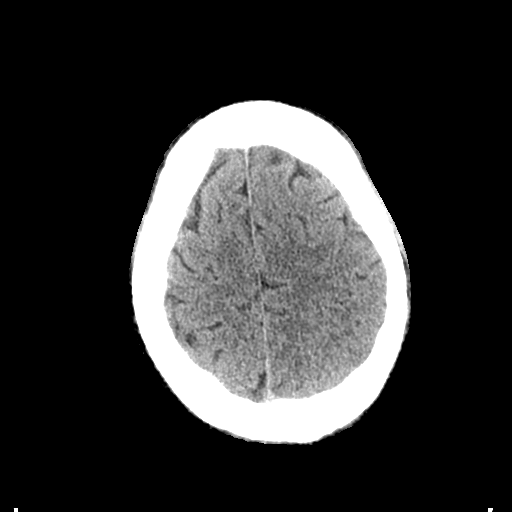
[im 27/30  brain]
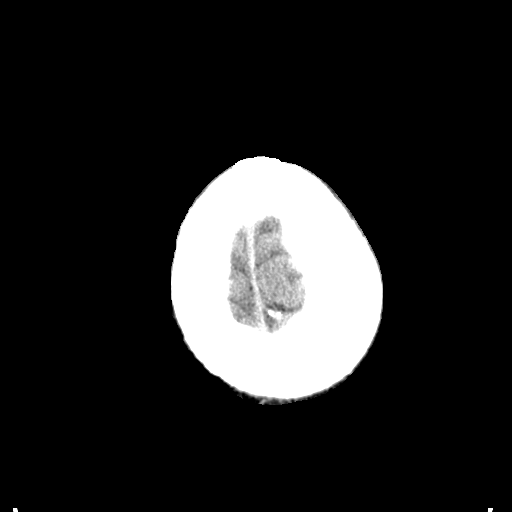
[im 27/30  bone]
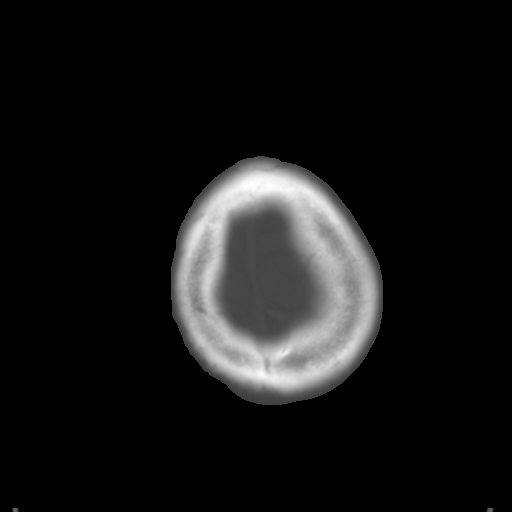

[Series 9: coronal soft tissue · coronal · 0.28mm/px · 3 of 84 slices shown]
[im 17/84  brain]
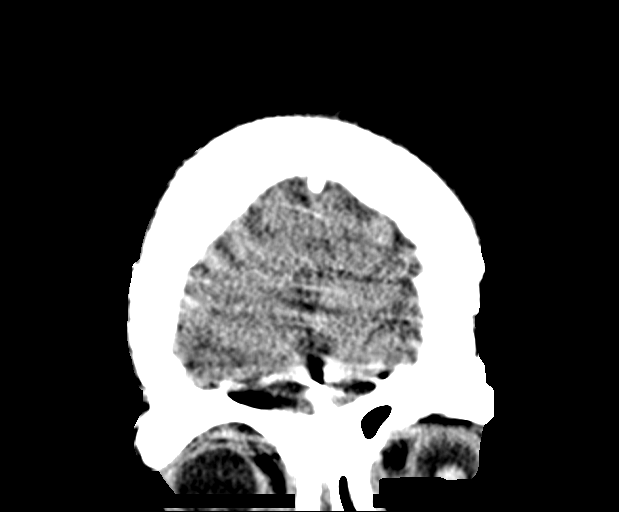
[im 34/84  brain]
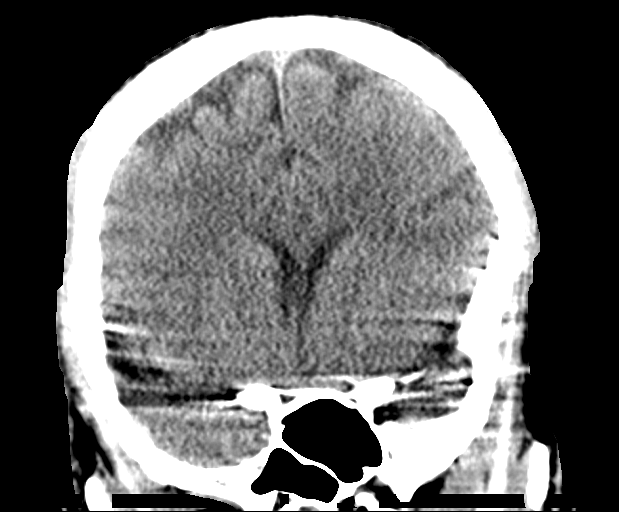
[im 50/84  brain]
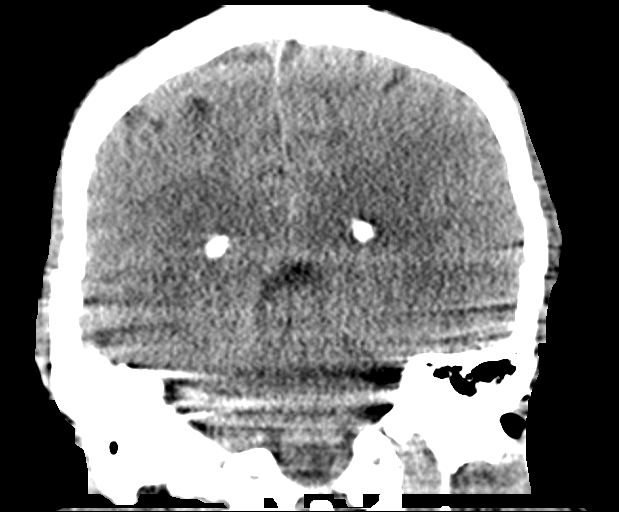

[Series 10: sagittal soft tissue · sagittal · 0.28mm/px · 2 of 51 slices shown]
[im 17/51  brain]
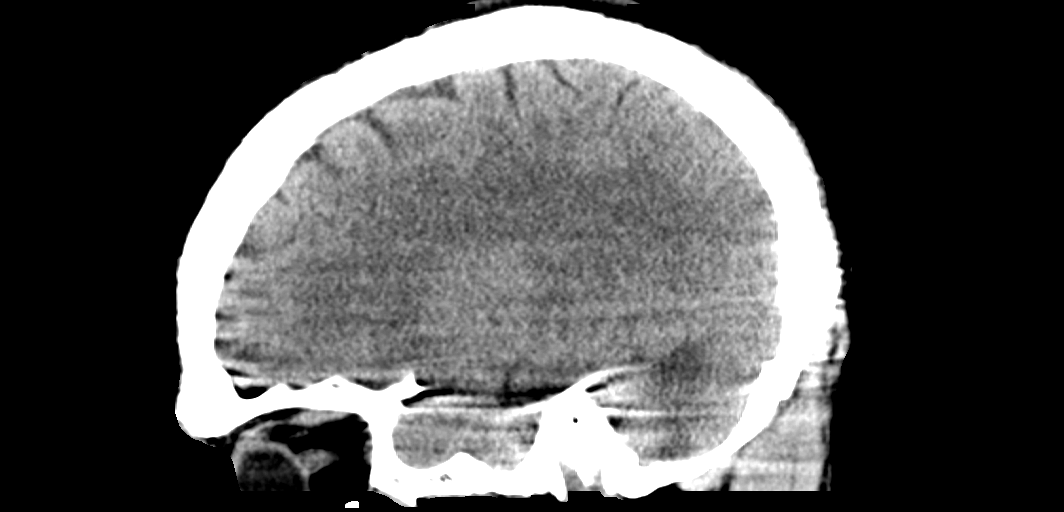
[im 34/51  brain]
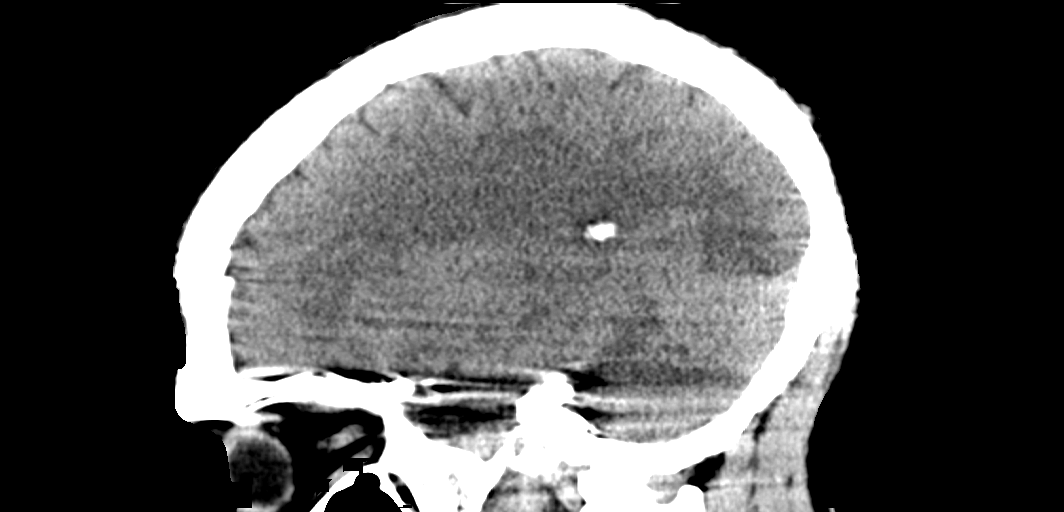

[14 of 47 positions shown; findings below may reference images not displayed]

FINDINGS: Brain: Patchy low-density in the bilateral cerebellum and occipital
lobes. No low-density seen in the anterior circulation distribution.
No hemorrhage, hydrocephalus, or mass effect.

Vascular: No hyperdense vessel, including the basilar

Skull: Negative

Sinuses/Orbits: Negative

Other: Critical Value/emergent results were called by telephone at
the time of interpretation on 01/03/2018 at [DATE] to Dr. MAJORMOEN
MARTHUS , who verbally acknowledged these results.
IMPRESSION: Patchy low-density in the bilateral cerebellum and occipital lobes.
This pattern suggests posterior circulation acute infarcts-consider
CTA. Given young age and recent chemotherapy underlying PRES is a
consideration-although no symmetric cerebral edema. Metastatic
disease with edema is possible but less likely given the
distribution.

## 2019-11-27 IMAGING — MR MR HEAD W/O CM
4 series · 48 of 48 positions shown · non-contrast
Comparison: CT HEAD and CT angiogram head and neck January 03, 2018

CLINICAL DATA: Follow-up basilar artery thrombosis and stroke.
History of testicular cancer.

EXAM:
MRI HEAD WITHOUT CONTRAST
TECHNIQUE: Axial diffusion weighted imaging, axial T2 FLAIR and axial T2.

[Series 3: DWI · axial · 3.0mm · 0.94mm/px · z∈[-181,-28]mm · 24 of 106 slices shown]
[im 1/106]
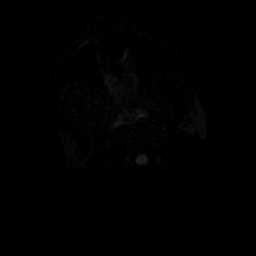
[im 5/106]
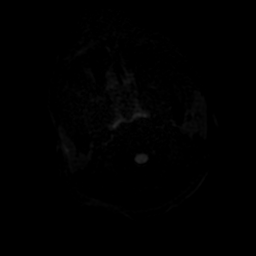
[im 10/106]
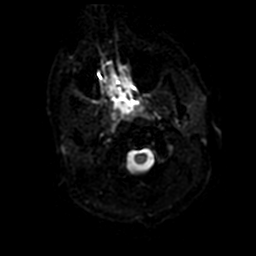
[im 14/106]
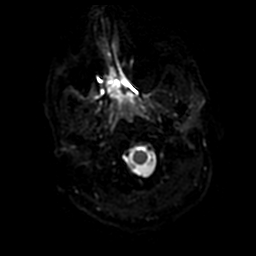
[im 19/106]
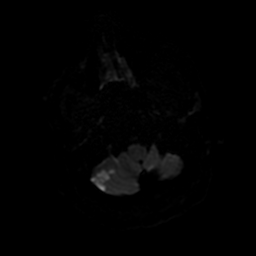
[im 23/106]
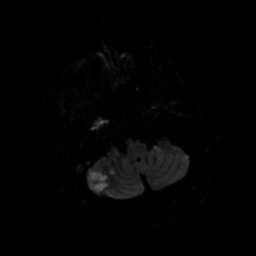
[im 28/106]
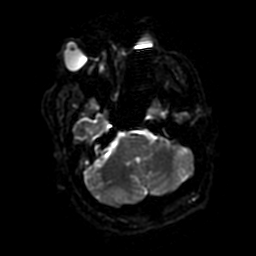
[im 32/106]
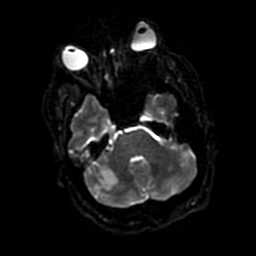
[im 37/106]
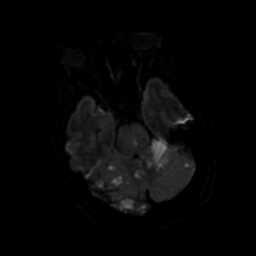
[im 42/106]
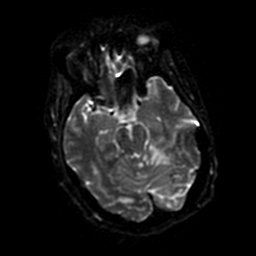
[im 46/106]
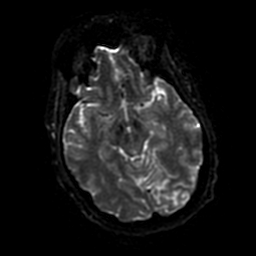
[im 51/106]
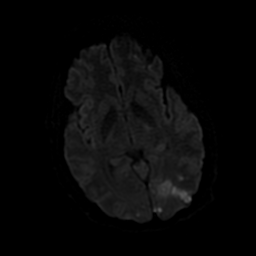
[im 55/106]
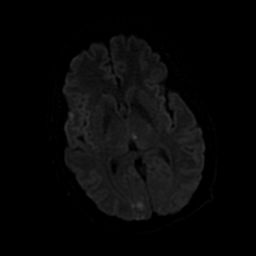
[im 60/106]
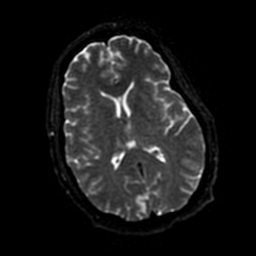
[im 64/106]
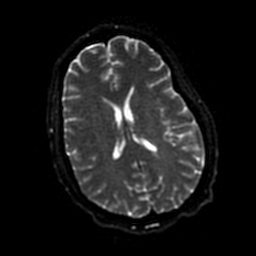
[im 69/106]
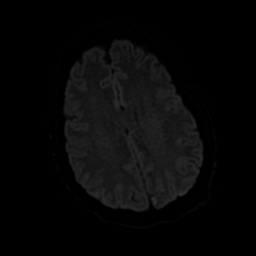
[im 74/106]
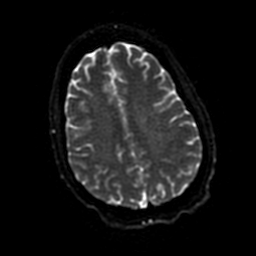
[im 78/106]
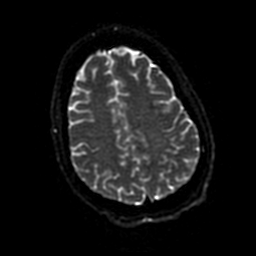
[im 83/106]
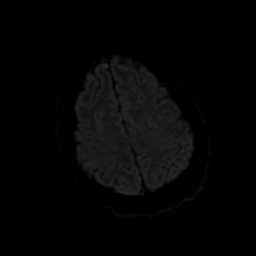
[im 87/106]
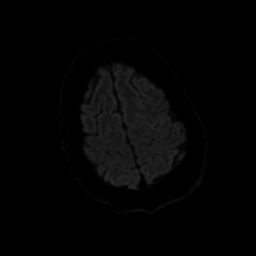
[im 92/106]
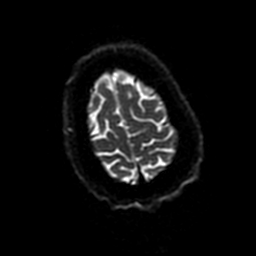
[im 96/106]
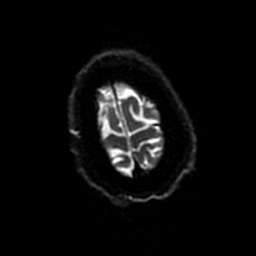
[im 101/106]
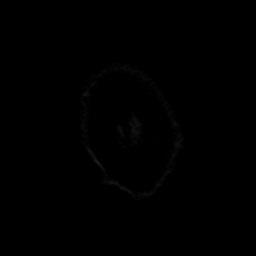
[im 106/106]
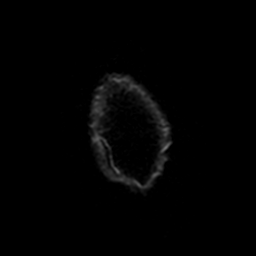

[Series 4: FLAIR · axial · 3.0mm · 0.90mm/px · z∈[-178,-19]mm · 6 of 28 slices shown]
[im 1/28]
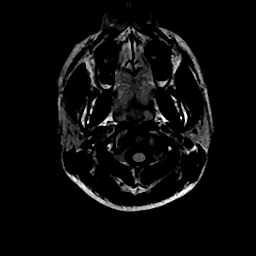
[im 6/28]
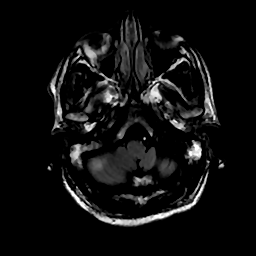
[im 11/28]
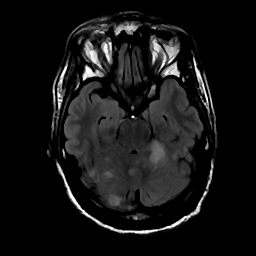
[im 17/28]
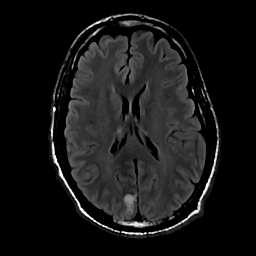
[im 22/28]
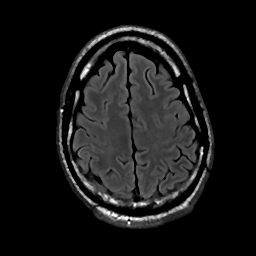
[im 28/28]
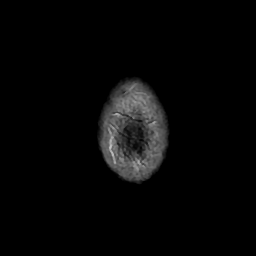

[Series 6: T2 · axial · 5.0mm · 0.90mm/px · z∈[-178,-19]mm · 6 of 28 slices shown]
[im 1/28]
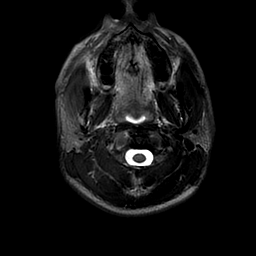
[im 6/28]
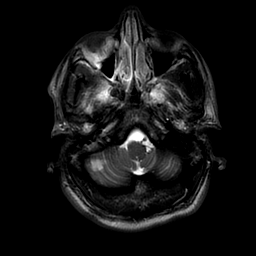
[im 11/28]
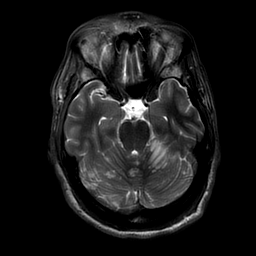
[im 17/28]
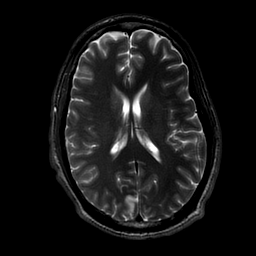
[im 22/28]
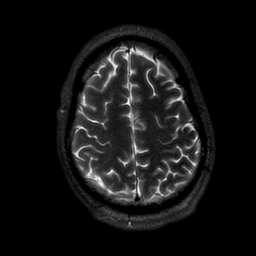
[im 28/28]
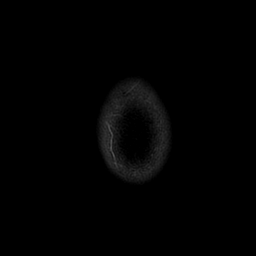

[Series 350: ADC · axial · 3.0mm · 0.94mm/px · z∈[-181,-28]mm · 12 of 53 slices shown]
[im 1/53]
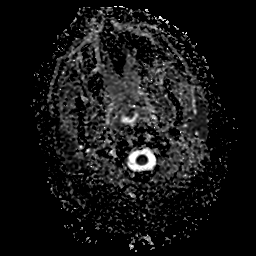
[im 5/53]
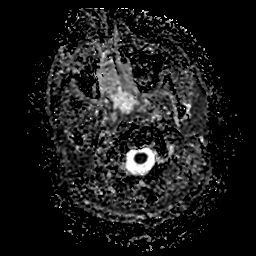
[im 10/53]
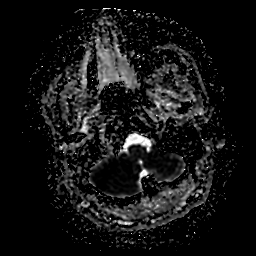
[im 15/53]
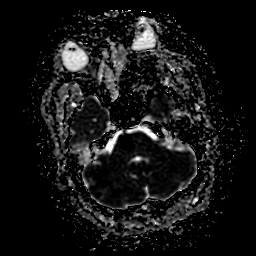
[im 19/53]
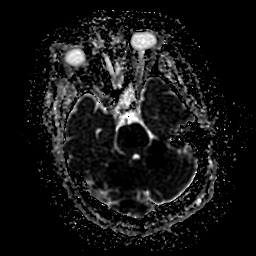
[im 24/53]
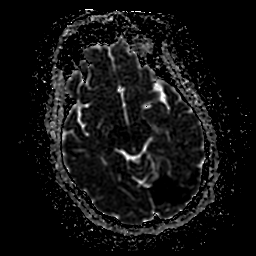
[im 29/53]
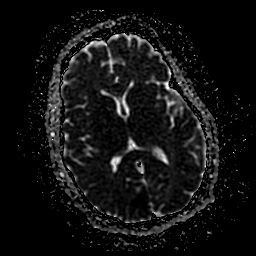
[im 34/53]
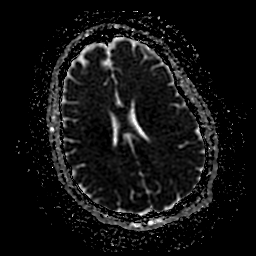
[im 38/53]
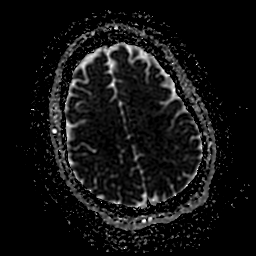
[im 43/53]
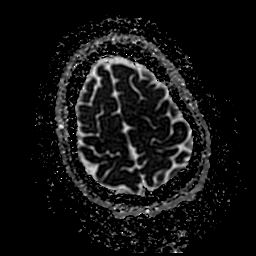
[im 48/53]
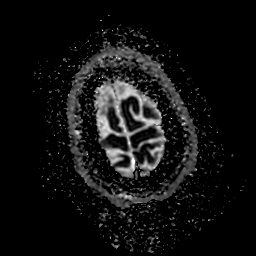
[im 53/53]
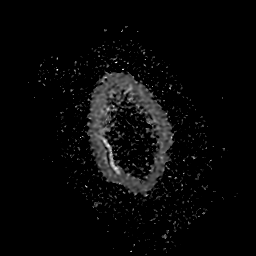

[48 of 48 positions shown; findings below may reference images not displayed]

FINDINGS: Brain: Patchy to confluent reduced diffusion bilateral cerebellum,
confluent reduced diffusion within the lower pons. Confluent LEFT
greater than RIGHT reduced diffusion bilateral occipital lobes,
patchy thalami and RIGHT splenium of corpus callosum reduced
diffusion. Subcentimeter areas of reduced diffusion bilateral
posterior temporal lobes. All areas of reduced diffusion demonstrate
low ADC values with mild T2 hyperintense cytotoxic edema. Patent
fourth ventricle, no hydrocephalus. No midline shift. No abnormal
extra-axial fluid collections.

Vascular: Intermediate signal vertebral arteries and basilar artery.

Skull and upper cervical spine: Not tailored for evaluation, non
suspicious.

Sinuses/Orbits: Mild paranasal sinus mucosal thickening. RIGHT
mastoid effusion.

Other: None.
IMPRESSION: 1. Acute numerous supra- and infratentorial posterior circulation
nonhemorrhagic infarcts.
2. MRI corroboration of known acute basilar artery thrombosis.
3. Critical Value/emergent results text paged to Dr.RTOYOTA JOSHJAX via
[employer] secure system on 01/03/2018 at [DATE], including interpreting
physician's phone number.

## 2023-08-08 ENCOUNTER — Encounter (HOSPITAL_COMMUNITY): Payer: Self-pay | Admitting: Interventional Radiology
